# Patient Record
Sex: Male | Born: 1940 | Race: Black or African American | Hispanic: No | Marital: Married | Smoking: Current every day smoker
Health system: Southern US, Community
[De-identification: ages and names within clinical notes are randomized; demographics above are authoritative.]

## PROBLEM LIST (undated history)

## (undated) DIAGNOSIS — R6520 Severe sepsis without septic shock: Secondary | ICD-10-CM

## (undated) DIAGNOSIS — I82409 Acute embolism and thrombosis of unspecified deep veins of unspecified lower extremity: Secondary | ICD-10-CM

## (undated) DIAGNOSIS — I482 Chronic atrial fibrillation, unspecified: Secondary | ICD-10-CM

## (undated) DIAGNOSIS — I2699 Other pulmonary embolism without acute cor pulmonale: Secondary | ICD-10-CM

## (undated) DIAGNOSIS — J9600 Acute respiratory failure, unspecified whether with hypoxia or hypercapnia: Secondary | ICD-10-CM

## (undated) DIAGNOSIS — K259 Gastric ulcer, unspecified as acute or chronic, without hemorrhage or perforation: Secondary | ICD-10-CM

## (undated) DIAGNOSIS — N390 Urinary tract infection, site not specified: Secondary | ICD-10-CM

## (undated) DIAGNOSIS — I272 Pulmonary hypertension, unspecified: Secondary | ICD-10-CM

## (undated) DIAGNOSIS — J45909 Unspecified asthma, uncomplicated: Secondary | ICD-10-CM

## (undated) DIAGNOSIS — K219 Gastro-esophageal reflux disease without esophagitis: Secondary | ICD-10-CM

## (undated) DIAGNOSIS — I509 Heart failure, unspecified: Secondary | ICD-10-CM

## (undated) DIAGNOSIS — E119 Type 2 diabetes mellitus without complications: Secondary | ICD-10-CM

## (undated) DIAGNOSIS — A419 Sepsis, unspecified organism: Secondary | ICD-10-CM

## (undated) DIAGNOSIS — E7800 Pure hypercholesterolemia, unspecified: Secondary | ICD-10-CM

## (undated) DIAGNOSIS — I10 Essential (primary) hypertension: Secondary | ICD-10-CM

## (undated) DIAGNOSIS — J449 Chronic obstructive pulmonary disease, unspecified: Secondary | ICD-10-CM

## (undated) DIAGNOSIS — I4891 Unspecified atrial fibrillation: Secondary | ICD-10-CM

## (undated) DIAGNOSIS — I50812 Chronic right heart failure: Secondary | ICD-10-CM

## (undated) HISTORY — PX: HERNIA REPAIR: SHX51

## (undated) HISTORY — PX: PROSTATE SURGERY: SHX751

---

## 2004-11-10 ENCOUNTER — Other Ambulatory Visit: Payer: Self-pay

## 2004-11-10 ENCOUNTER — Inpatient Hospital Stay: Payer: Self-pay | Admitting: Anesthesiology

## 2005-06-23 ENCOUNTER — Ambulatory Visit: Payer: Self-pay | Admitting: Internal Medicine

## 2006-06-28 ENCOUNTER — Ambulatory Visit: Payer: Self-pay | Admitting: Internal Medicine

## 2009-03-19 ENCOUNTER — Emergency Department: Payer: Self-pay | Admitting: Unknown Physician Specialty

## 2009-11-10 ENCOUNTER — Ambulatory Visit: Payer: Self-pay | Admitting: Internal Medicine

## 2009-11-10 ENCOUNTER — Inpatient Hospital Stay: Payer: Self-pay | Admitting: Internal Medicine

## 2010-04-13 ENCOUNTER — Ambulatory Visit: Payer: Self-pay | Admitting: Specialist

## 2010-09-16 ENCOUNTER — Ambulatory Visit: Payer: Self-pay | Admitting: Family Medicine

## 2010-09-29 ENCOUNTER — Ambulatory Visit: Payer: Self-pay | Admitting: Specialist

## 2010-11-10 ENCOUNTER — Ambulatory Visit: Payer: Self-pay | Admitting: Internal Medicine

## 2010-11-17 ENCOUNTER — Inpatient Hospital Stay: Payer: Self-pay | Admitting: Internal Medicine

## 2010-11-22 LAB — PSA

## 2010-12-02 ENCOUNTER — Ambulatory Visit: Payer: Self-pay | Admitting: Internal Medicine

## 2010-12-11 ENCOUNTER — Ambulatory Visit: Payer: Self-pay | Admitting: Internal Medicine

## 2011-04-13 ENCOUNTER — Inpatient Hospital Stay: Payer: Self-pay | Admitting: Internal Medicine

## 2011-04-14 DIAGNOSIS — I4891 Unspecified atrial fibrillation: Secondary | ICD-10-CM

## 2011-09-11 ENCOUNTER — Inpatient Hospital Stay: Payer: Self-pay | Admitting: Internal Medicine

## 2013-11-24 ENCOUNTER — Observation Stay: Payer: Self-pay | Admitting: Internal Medicine

## 2013-11-24 LAB — BASIC METABOLIC PANEL
BUN: 17 mg/dL (ref 7–18)
Calcium, Total: 10.5 mg/dL — ABNORMAL HIGH (ref 8.5–10.1)
Chloride: 108 mmol/L — ABNORMAL HIGH (ref 98–107)
Co2: 30 mmol/L (ref 21–32)
Creatinine: 1.25 mg/dL (ref 0.60–1.30)
EGFR (African American): >60
EGFR (Non-African Amer.): 58 — ABNORMAL LOW
Glucose: 114 mg/dL — ABNORMAL HIGH (ref 65–99)
Potassium: 4.2 mmol/L (ref 3.5–5.1)
Sodium: 140 mmol/L (ref 136–145)

## 2013-11-24 LAB — CBC
MCH: 31.3 pg (ref 26.0–34.0)
MCHC: 32.3 g/dL (ref 32.0–36.0)
MCV: 97 fL (ref 80–100)
RDW: 15.9 % — ABNORMAL HIGH (ref 11.5–14.5)
WBC: 6.9 10*3/uL (ref 3.8–10.6)

## 2013-11-24 LAB — PROTIME-INR
INR: 1.2
Prothrombin Time: 15.2 secs — ABNORMAL HIGH (ref 11.5–14.7)

## 2013-11-25 LAB — BASIC METABOLIC PANEL
Anion Gap: 4 — ABNORMAL LOW (ref 7–16)
BUN: 14 mg/dL (ref 7–18)
EGFR (African American): >60
Osmolality: 275 (ref 275–301)
Potassium: 3.9 mmol/L (ref 3.5–5.1)
Sodium: 138 mmol/L (ref 136–145)

## 2013-11-25 LAB — CBC WITH DIFFERENTIAL/PLATELET
Basophil #: 0 10*3/uL (ref 0.0–0.1)
HGB: 14 g/dL (ref 13.0–18.0)
Lymphocyte #: 1.3 10*3/uL (ref 1.0–3.6)
MCH: 31.4 pg (ref 26.0–34.0)
MCV: 96 fL (ref 80–100)
Monocyte #: 0.6 x10 3/mm (ref 0.2–1.0)
Monocyte %: 8.3 %
Neutrophil %: 72.4 %
Platelet: 138 10*3/uL — ABNORMAL LOW (ref 150–440)
RBC: 4.45 10*6/uL (ref 4.40–5.90)
RDW: 15.5 % — ABNORMAL HIGH (ref 11.5–14.5)
WBC: 7.1 10*3/uL (ref 3.8–10.6)

## 2013-11-25 LAB — HEMOGLOBIN A1C: Hemoglobin A1C: 7.1 % — ABNORMAL HIGH (ref 4.2–6.3)

## 2014-03-19 ENCOUNTER — Inpatient Hospital Stay: Payer: Self-pay | Admitting: Family Medicine

## 2014-03-19 LAB — URINALYSIS, COMPLETE
BILIRUBIN, UR: NEGATIVE
Bacteria: NONE SEEN
Glucose,UR: NEGATIVE mg/dL (ref 0–75)
Ketone: NEGATIVE
Leukocyte Esterase: NEGATIVE
NITRITE: NEGATIVE
PH: 5 (ref 4.5–8.0)
RBC,UR: 68 /HPF (ref 0–5)
Specific Gravity: 1.018 (ref 1.003–1.030)
Squamous Epithelial: 1
WBC UR: 4 /HPF (ref 0–5)

## 2014-03-19 LAB — COMPREHENSIVE METABOLIC PANEL
ALBUMIN: 3.3 g/dL — AB (ref 3.4–5.0)
AST: 13 U/L — AB (ref 15–37)
Alkaline Phosphatase: 77 U/L
Anion Gap: 5 — ABNORMAL LOW (ref 7–16)
BUN: 13 mg/dL (ref 7–18)
Bilirubin,Total: 0.6 mg/dL (ref 0.2–1.0)
CO2: 27 mmol/L (ref 21–32)
CREATININE: 1.28 mg/dL (ref 0.60–1.30)
Calcium, Total: 9.8 mg/dL (ref 8.5–10.1)
Chloride: 105 mmol/L (ref 98–107)
EGFR (African American): >60
EGFR (Non-African Amer.): 56 — ABNORMAL LOW
Glucose: 111 mg/dL — ABNORMAL HIGH (ref 65–99)
OSMOLALITY: 275 (ref 275–301)
POTASSIUM: 3.7 mmol/L (ref 3.5–5.1)
SGPT (ALT): 11 U/L — ABNORMAL LOW (ref 12–78)
Sodium: 137 mmol/L (ref 136–145)
TOTAL PROTEIN: 6.5 g/dL (ref 6.4–8.2)

## 2014-03-19 LAB — CBC WITH DIFFERENTIAL/PLATELET
BASOS PCT: 0.7 %
Basophil #: 0 10*3/uL (ref 0.0–0.1)
EOS ABS: 0 10*3/uL (ref 0.0–0.7)
EOS PCT: 0.3 %
HCT: 45.7 % (ref 40.0–52.0)
HGB: 15.1 g/dL (ref 13.0–18.0)
Lymphocyte #: 0.2 10*3/uL — ABNORMAL LOW (ref 1.0–3.6)
Lymphocyte %: 3.8 %
MCH: 31.9 pg (ref 26.0–34.0)
MCHC: 33 g/dL (ref 32.0–36.0)
MCV: 97 fL (ref 80–100)
MONO ABS: 0.9 x10 3/mm (ref 0.2–1.0)
MONOS PCT: 15.7 %
Neutrophil #: 4.8 10*3/uL (ref 1.4–6.5)
Neutrophil %: 79.5 %
Platelet: 149 10*3/uL — ABNORMAL LOW (ref 150–440)
RBC: 4.72 10*6/uL (ref 4.40–5.90)
RDW: 14.4 % (ref 11.5–14.5)
WBC: 6 10*3/uL (ref 3.8–10.6)

## 2014-03-19 LAB — MAGNESIUM: Magnesium: 1.5 mg/dL — ABNORMAL LOW

## 2014-03-19 LAB — PROTIME-INR
INR: 1.2
Prothrombin Time: 15.3 secs — ABNORMAL HIGH (ref 11.5–14.7)

## 2014-03-19 LAB — TROPONIN I: Troponin-I: 0.13 ng/mL — ABNORMAL HIGH

## 2014-03-19 LAB — PRO B NATRIURETIC PEPTIDE: B-Type Natriuretic Peptide: 2622 pg/mL — ABNORMAL HIGH (ref 0–125)

## 2014-03-19 LAB — LIPASE, BLOOD: LIPASE: 90 U/L (ref 73–393)

## 2014-03-20 LAB — CK-MB
CK-MB: 1.8 ng/mL (ref 0.5–3.6)
CK-MB: 2.6 ng/mL (ref 0.5–3.6)
CK-MB: 2.2 ng/mL (ref 0.5–3.6)

## 2014-03-20 LAB — TROPONIN I
TROPONIN-I: 1.5 ng/mL — AB
Troponin-I: 0.53 ng/mL — ABNORMAL HIGH
Troponin-I: 2 ng/mL — ABNORMAL HIGH
Troponin-I: 2.5 ng/mL — ABNORMAL HIGH
Troponin-I: 2.2 ng/mL — ABNORMAL HIGH

## 2014-03-21 LAB — CBC WITH DIFFERENTIAL/PLATELET
BASOS PCT: 1.1 %
Basophil #: 0 10*3/uL (ref 0.0–0.1)
Eosinophil #: 0 10*3/uL (ref 0.0–0.7)
Eosinophil %: 0 %
HCT: 44.9 % (ref 40.0–52.0)
HGB: 14.3 g/dL (ref 13.0–18.0)
LYMPHS PCT: 11.5 %
Lymphocyte #: 0.3 10*3/uL — ABNORMAL LOW (ref 1.0–3.6)
MCH: 31.2 pg (ref 26.0–34.0)
MCHC: 31.9 g/dL — ABNORMAL LOW (ref 32.0–36.0)
MCV: 98 fL (ref 80–100)
MONO ABS: 0.1 x10 3/mm — AB (ref 0.2–1.0)
MONOS PCT: 4.6 %
NEUTROS PCT: 82.8 %
Neutrophil #: 2.3 10*3/uL (ref 1.4–6.5)
PLATELETS: 137 10*3/uL — AB (ref 150–440)
RBC: 4.6 10*6/uL (ref 4.40–5.90)
RDW: 14.2 % (ref 11.5–14.5)
WBC: 2.8 10*3/uL — ABNORMAL LOW (ref 3.8–10.6)

## 2014-03-21 LAB — BASIC METABOLIC PANEL
Anion Gap: 3 — ABNORMAL LOW (ref 7–16)
BUN: 16 mg/dL (ref 7–18)
CREATININE: 1.16 mg/dL (ref 0.60–1.30)
Calcium, Total: 9.8 mg/dL (ref 8.5–10.1)
Chloride: 104 mmol/L (ref 98–107)
Co2: 30 mmol/L (ref 21–32)
EGFR (African American): >60
EGFR (Non-African Amer.): >60
Glucose: 200 mg/dL — ABNORMAL HIGH (ref 65–99)
Osmolality: 281 (ref 275–301)
Potassium: 4.7 mmol/L (ref 3.5–5.1)
Sodium: 137 mmol/L (ref 136–145)

## 2014-03-22 LAB — BASIC METABOLIC PANEL
Anion Gap: 5 — ABNORMAL LOW (ref 7–16)
BUN: 20 mg/dL — ABNORMAL HIGH (ref 7–18)
CHLORIDE: 103 mmol/L (ref 98–107)
CO2: 28 mmol/L (ref 21–32)
CREATININE: 0.99 mg/dL (ref 0.60–1.30)
Calcium, Total: 9.7 mg/dL (ref 8.5–10.1)
EGFR (African American): >60
Glucose: 208 mg/dL — ABNORMAL HIGH (ref 65–99)
Osmolality: 281 (ref 275–301)
Potassium: 4.4 mmol/L (ref 3.5–5.1)
SODIUM: 136 mmol/L (ref 136–145)

## 2014-03-22 LAB — MAGNESIUM: Magnesium: 1.6 mg/dL — ABNORMAL LOW

## 2014-03-23 LAB — LIPID PANEL
Cholesterol: 130 mg/dL (ref 0–200)
HDL Cholesterol: 34 mg/dL — ABNORMAL LOW (ref 40–60)
Ldl Cholesterol, Calc: 74 mg/dL (ref 0–100)
TRIGLYCERIDES: 111 mg/dL (ref 0–200)
VLDL Cholesterol, Calc: 22 mg/dL (ref 5–40)

## 2014-03-24 LAB — CULTURE, BLOOD (SINGLE)

## 2015-03-06 ENCOUNTER — Emergency Department: Payer: Self-pay | Admitting: Emergency Medicine

## 2015-03-06 LAB — COMPREHENSIVE METABOLIC PANEL
ALK PHOS: 67 U/L
ANION GAP: 7 (ref 7–16)
Albumin: 3.8 g/dL
BILIRUBIN TOTAL: 1.1 mg/dL
BUN: 15 mg/dL
CHLORIDE: 102 mmol/L
CREATININE: 1.19 mg/dL
Calcium, Total: 10.1 mg/dL
Co2: 29 mmol/L
EGFR (African American): >60
EGFR (Non-African Amer.): >60
Glucose: 77 mg/dL
Potassium: 3.9 mmol/L
SGOT(AST): 20 U/L
SGPT (ALT): 9 U/L — ABNORMAL LOW
Sodium: 138 mmol/L
Total Protein: 6.5 g/dL

## 2015-03-06 LAB — CBC WITH DIFFERENTIAL/PLATELET
Basophil #: 0.1 10*3/uL (ref 0.0–0.1)
Basophil %: 1.1 %
Eosinophil #: 0.1 10*3/uL (ref 0.0–0.7)
Eosinophil %: 2.4 %
HCT: 47.7 % (ref 40.0–52.0)
HGB: 15.3 g/dL (ref 13.0–18.0)
Lymphocyte #: 1 10*3/uL (ref 1.0–3.6)
Lymphocyte %: 18.5 %
MCH: 32.1 pg (ref 26.0–34.0)
MCHC: 32.1 g/dL (ref 32.0–36.0)
MCV: 100 fL (ref 80–100)
MONO ABS: 0.5 x10 3/mm (ref 0.2–1.0)
MONOS PCT: 9.2 %
NEUTROS ABS: 3.8 10*3/uL (ref 1.4–6.5)
Neutrophil %: 68.8 %
Platelet: 138 10*3/uL — ABNORMAL LOW (ref 150–440)
RBC: 4.79 10*6/uL (ref 4.40–5.90)
RDW: 15.5 % — AB (ref 11.5–14.5)
WBC: 5.5 10*3/uL (ref 3.8–10.6)

## 2015-03-06 LAB — LIPASE, BLOOD: Lipase: 48 U/L

## 2015-03-06 LAB — TROPONIN I
TROPONIN-I: 0.07 ng/mL — AB
TROPONIN-I: 0.05 ng/mL — AB

## 2015-04-02 NOTE — H&P (Signed)
PATIENT NAME:  Austin Austin [name], Austin Austin [name] MR#:  [id number] DATE OF BIRTH:  [birth year]  DATE OF ADMISSION:  11/24/2013  PRIMARY CARE PHYSICIMarlowe SaxN: [name]  CARDIOLOGIST: Dr. Juliann Austin [name].  PRIMARY PULMONOLOGIST: In the past was Dr. Meredeth IdeFleming, currently no one   REFERRING EMERGENCY ROOM PHYSICIAN: Dr. Rockne MenghiniAnne-Caroline Austin [name].  CHIEF COMPLAINT: Coughing up blood.   HISTORY OF PRESENT ILLNESS: This is Austin [name] 74 year old male who is Austin [name] chronic smoker, still smokes 1 pack of cigarettes per day. Has history of severe pulmonary emphysema secondary to his smoking habit. Also had pulmonary embolism in the past, diagnosed in 2010, taken off Coumadin because of GI bleed in December 2011. Lung nodule in right lung, but PET scan was negative for malignancy in the past. Was doing fine until 2 days ago when he started having worsening of his cough and finally, he started having some blood in his sputum. He has been coughing up since yesterday and so finally got concerned and decided to come to the Emergency Room. He denies any sputum production or any fever, any chills or chest pain.   REVIEW OF SYSTEMS:    CONSTITUTIONAL: Negative for fever, body weakness, pain, or weight loss.  EYES: No blurring, double vision, discharge, or redness.  EARS, NOSE, THROAT: No tinnitus, ear pain, or hearing loss.  RESPIRATORY: Has cough but no wheezing or shortness of breath. Has some blood in his sputum.  CARDIOVASCULAR: No chest pain, orthopnea, edema, arrhythmia, or palpitations.  GASTROINTESTINAL: No nausea, vomiting, diarrhea, or abdominal pain.  GENITOURINARY: No dysuria, hematuria, or increased frequency.  ENDOCRINE: No heat or cold intolerance. No increased sweating.  SKIN: No acne or rashes or lesions.  MUSCULOSKELETAL: No pain or swelling in the joints.  NEUROLOGICAL: No numbness, weakness, tremors, or vertigo.  PSYCHIATRIC: Does not appear in any anxiety or insomnia or bipolar disorder.   PAST  MEDICAL HISTORY:  1.  Hypertension.  2.  Diabetes.  3.  Hyperlipidemia.  4.  COPD, current smoker.  5.  History of pulmonary embolism in the past in 2010 and was on Coumadin, stopped due to GI bleed in December 2011.  6.  History of lung nodule in the past in right lung, but PET scan was negative for malignancy.  7.  Diagnosed with atrial fibrillation in May 2012 and had cardiac catheterization, suggested to have medical therapy.   PAST SURGICAL HISTORY: Cholecystectomy, prostate surgery for enlarged prostate, surgery of bridge of nose.   SOCIAL HISTORY: Lives with his wife. Retired as Austin [name] Visual merchandiserfarmer. Smoked 1 pack per day since many years. No alcohol or drug use.   FAMILY HISTORY: Brother had kidney transplant and sister had brain aneurysm.   HOME MEDICATIONS:   1.  Omeprazole 20 mg 2 times Austin [name] day.  2.  Metformin 500 mg 2 tablets 2 times Austin [name] day.  3.  Lovastatin 40 mg once Austin [name] day.  4.  Lisinopril 40 mg once Austin [name] day.  5.  Januvia 100 mg once Austin [name] day.  6.  Furosemide 20 mg 2 tablets once Austin [name] day.  7.  Combivent 2 puffs as needed.  8.  Aspirin 81 mg.  9.  Amlodipine 5 mg once Austin [name] day.  10.  Advair 1 puff 2 times Austin [name] day.   PHYSICAL EXAMINATION: VITAL SIGNS: In ER: Temperature 95.3, pulse rate 70, blood pressure 127/62, respirations 18, and he is saturating 94% to 95% on room air.  GENERAL: The patient is fully alert and oriented to time, place, and  person. Does not appear in any acute distress.  HEENT: Head and neck atraumatic. Conjunctivae pink. Oral mucosa moist.  NECK: Supple. No JVD.  RESPIRATORY: Bilateral equal air entry. There are diffuse crackling sounds present bilaterally. No local tenderness on chest compression.  CARDIOVASCULAR: S1, S2 present, regular. No murmur.  ABDOMEN: Soft, nontender. Bowel sounds present. No organomegaly.  SKIN: No rashes.  EXTREMITIES: Legs: No edema.  NEUROLOGICAL: Power 5/5. Moves all 4 limbs and follows commands.  PSYCHIATRIC: Does not appear in any acute  psychiatric illness.  MUSCULOSKELETAL: Joints: No swelling or tenderness.   IMPORTANT LABORATORY AND RADIOLOGIC RESULTS: Glucose 114, BUN 17, creatinine 1.25, potassium 4.2, chloride 108, CO2 of 30, calcium 10.5. WBC 6.9, hemoglobin 14.9, platelet count 155. Prothrombin 15.2, INR 1.2 , PTT 30.8. PH 7.44, pCO2 of 38 and pO2 of 51 on room air. Chest x-ray does not show any acute finding except for emphysema. V/Q scan is done which showed indeterminate ventricular perfusion lung scan due to diffuse abnormality. Ultrasound, bilateral lower limbs: No evidence of lower extremity DVT.   ASSESSMENT AND PLAN: This is Austin [name] 74 year old male who is Austin [name] smoker with emphysema history and pulmonary embolism, came with hemoptysis for the last 2 days.  1.  Hemoptysis: Most likely should be either as Austin [name] result of his chronic smoking and emphysema, or it would be Austin [name] malignancy. Will get Austin [name] CT scan of the chest without contrast because THE PATIENT IS ALLERGIC TO CONTRAST DYE and will monitor his hemoglobin and will call pulmonary consult, Dr. Meredeth [name] as he saw him in the past, for further management of this issue  2.  Chronic obstructive pulmonary disease and emphysema: Currently, there are no exacerbation signs, no wheezing, so we will continue Advair and his bronchodilator inhalers, as-needed basis.  3.  History of pulmonary embolism: He was not on any anticoagulation because of gastrointestinal bleed in the past. Will continue monitoring. I do not think it is Austin [name] pulmonary embolism, as he is not very hypoxic and he is saturating 94 to 95 on room air having severe emphysema, so pulmonary embolism is less likely in this case. For chronic obstructive pulmonary disease there are no exacerbation symptoms, so we will continue the baseline things.  4.  Current smoker: Smoking cessation counseling is done for 4 minutes and offered Austin [name] nicotine patch and gum, and he agreed to have that. 5.  Diabetes: We will continue Januvia and metformin and  will put him on insulin sliding scale coverage.  6.  Hypertension: We will continue his home medication.   TOTAL TIME SPENT ON THIS ADMISSION: 50 minutes.   ____________________________ Hope Pigeon Elisabeth Pigeon, MD vgv:jcm D: 11/24/2013 15:16:00 ET T: 11/24/2013 16:47:22 ET JOB#: [id number]  cc: Hope Pigeon. Elisabeth Pigeon, MD, <Dictator> Herbon E. Austin Ide, MD Heath Gold [facility] MD ELECTRONICALLY SIGNED 12/01/2013 14:22

## 2015-04-03 NOTE — Consult Note (Signed)
PATIENT NAME:  Austin [name], Masahiro A MR#:  [id number] DATE OF BIRTH:  December 02, 1941  DATE OF CONSULTATION:  03/20/2014  REFERRING PHYSICIAN:  Alford Highlandichard Wieting, MD CONSULTING PHYSICIAN:  Lamar BlinksBruce J. Nashla Althoff, MD  REASON FOR CONSULTATION: Atrial fibrillation with rapid ventricular rate, dilated cardiomyopathy, hypertension, and hyperlipidemia with sepsis and sick sinus syndrome. The patient has had recent episode of sepsis of unknown etiology with some bronchitis-type symptoms. He has had chronic atrial fibrillation in the past, although currently is now in normal sinus rhythm after an episode of atrial fibrillation with rapid ventricular rate. The patient has had a subendocardial myocardial infarction with an elevated troponin of 2 and an elevated BNP of 2622. He has been previously on appropriate medication management for his diabetes, hypertension, hyperlipidemia, atrial fibrillation, and pulmonary hypertension. He does feel better since admission with less symptoms.   REVIEW OF SYSTEMS: The remainder review of systems is negative for vision change, ringing in the ears, hearing loss, heartburn, nausea, vomiting, diarrhea, bloody stools, stomach pain, extremity pain, leg weakness, cramping of the buttocks, known blood clots, headaches, blackouts, dizzy spells, nosebleeds, congestion, trouble swallowing, frequent urination, urination at night, muscle weakness, numbness, anxiety, depression, skin lesions or skin rashes.   PAST MEDICAL HISTORY: 1.  Intermittent atrial fibrillation.  2.  Hypertension.  3.  Diabetes mellitus.  4.  Valvular heart disease.  5.  Pulmonary hypertension.   FAMILY HISTORY: No family members with early onset of cardiovascular disease or hypertension.   SOCIAL HISTORY: Currently denies alcohol or tobacco use.   ALLERGIES: As listed.   MEDICATIONS: As listed.   PHYSICAL EXAMINATION: VITAL SIGNS: Blood pressure is 110/68 bilaterally and heart rate is 72 upright and reclining and  irregular.  GENERAL: He is a well appearing male in no acute distress.  HEENT: No icterus, thyromegaly, ulcers, hemorrhage, or xanthelasma.  CARDIOVASCULAR: Irregularly irregular. Normal S1 and S2. A 3/6 apical murmur consistent with mitral regurgitation. PMI is diffuse. Carotid upstroke normal without bruit. Jugular venous pressure is normal.  LUNGS: Have bibasilar crackles with expiratory wheezes.  ABDOMEN: Soft and nontender without hepatosplenomegaly or masses. Abdominal aorta is normal size without bruit.  EXTREMITIES: 2+ bilateral pulses in dorsal, pedal, radial and femoral arteries with trace lower extremity edema. No cyanosis, clubbing, or ulcers.  NEUROLOGIC: He is oriented to time, place, and person with normal mood and affect.   ASSESSMENT: This is a 74 year old male with diabetes, hypertension, and hyperlipidemia with atrial fibrillation with rapid ventricular rate likely secondary to recent infection and concerns of mild systolic dysfunction, congestive heart failure, and elevated troponin consistent with subendocardial myocardial infarction.   RECOMMENDATIONS: 1.  Continue serial ECG and enzymes to assess for possible extremes of myocardial infarction.  2.  Continue heart rate control with beta blocker and maintenance of normal sinus rhythm.  3.  Further consideration of anticoagulation if the patient able if no concerns of current bleeding complications.  4.  Treat primary problem including the possibility of sepsis and/or infection.  5.  Diabetes control with insulin injection.  6.  Echocardiogram for LV systolic dysfunction and valvular heart disease causing above issues.  7.  Further adjustments of medications thereafter and possible discharge to home.  ____________________________ Lamar BlinksBruce J. Brytani Voth, MD bjk:sb D: 03/20/2014 14:39:36 ET T: 03/20/2014 15:05:50 ET JOB#: [id number]  cc: Lamar BlinksBruce J. Deajah Erkkila, MD, <Dictator> Lamar BlinksBRUCE J Mark Benecke MD ELECTRONICALLY SIGNED 03/24/2014 7:50

## 2015-04-03 NOTE — Discharge Summary (Signed)
PATIENT NAME:  Austin Austin [name], Austin Austin [name] MR#:  [id number] DATE OF BIRTH:  [birth year]  DATE OF ADMISSION:  03/19/2014 DATE OF DISCHARGE:  03/23/2014  REASON FOR ADMISSION: Shortness of breath.   PRIMARY CARE PHYSICIAN: Dr. Jorene Guestomstock  DISCHARGE FOLLOWUP: With Dr. Jorene Guestomstock and Dr. Juliann Paresallwood. Please arrange stress test outpatient and echocardiogram outpatient in the office of Dr. Juliann Paresallwood.   DISCHARGE DIAGNOSES: 1.  Atrial fibrillation with rapid ventricular response, now normal sinus rhythm.  2.  Non-ST elevation myocardial infarction, type II, due to demand ischemia.  3.  Sepsis at admission.  4.  Chronic obstructive pulmonary disease with acute bronchitis.  5.  Acute hypoxic respiratory failure due to chronic obstructive pulmonary disease and congestive heart failure flare.  6.  Diastolic congestive heart failure, compensated.  7.  Type 2 diabetes on Januvia and metformin.  8.  Tobacco abuse.  9.  Smoking cessation counseling given to the patient for over 5 minutes at discharge.   CODE STATUS: FULL.  HOSPITAL COURSE: This is Austin [name] very nice 74 year old gentleman with history of COPD, hypertension, diabetes, and atrial fibrillation, new diagnosis, presenting with shortness of breath and atrial fibrillation with RVR. The patient was seen in the hospital with significant shortness of breath, requiring 6 liters of oxygen.   He had significant elevation of heart rate around 140 and had significant saturation of O2 in the 70s on room air. The patient was admitted with the diagnosis of sepsis. The sepsis was secondary to an exacerbation of his COPD with acute bronchitis. The patient was started on Zosyn and vancomycin to cover for possible pneumonia. Chest x-ray did not show any significant consolidation, mostly changes that were equivalent to bronchitis, for what those antibiotics were downgraded and changed to levofloxacin. The patient was started on steroids and now we have him on Austin [name] steroid taper with  prednisone.   As far as atrial fibrillation, since it was rapid ventricular response, I spoke with Dr. Gwen PoundsKowalski. From his records, he thinks that this is new onset as he apparently has history of intermittent, but has been off normal sinus syndrome rhythm for Austin [name] while. He recommended no chronic anticoagulation at this moment and re-evaluate in the office. The patient was treated with Lovenox 1 mg/kg due to that acute onset atrial fibrillation on top non-ST elevation MI.   As far as his non-ST elevation MI, the patient does not have history of known coronary artery disease. He does have valvular disease and it seems like the non-ST elevation MI was secondary to demand ischemia as the patient had atrial fibrillation. Since the patient had significant acceleration of the heart rate, there was no echocardiogram done. It is scheduled to be done outpatient.   The patient did have what appears to be Austin [name] component of CHF, although his x-ray did not show any significant fluid overload. The evaluation of CHF is going to be done outpatient with an echocardiogram and Austin [name] stress test in the office. As far as his non-ST elevation MI, the patient also was started on aspirin. He was started on Austin [name] beta blocker and Austin [name] statin. Lipid profile was sent and is pending at this moment. Again, the patient did not have any significant history of coronary artery disease and the heart attack was mostly secondary to demand ischemia, as per Dr. Gwen PoundsKowalski. We are doing Austin [name] lipid profile for management of prevention in the future.   As far as his COPD, the patient has improved. He is off oxygen. His acute respiratory failure  resolved. He had hypomagnesemia, which was replaced. He had significant increase in blood sugars due to steroids. Dose of steroid was tapered and he is going to go back on metformin and sitagliptin. The patient is discharged in good condition.   TIME SPENT ON DISCHARGE: About 45 minutes. ____________________________ Felipa Furnace, MD rsg:sb D: 03/23/2014 11:45:18 ET T: 03/23/2014 12:00:34 ET JOB#: [id number]  cc: Felipa Furnace, MD, <Dictator> Johncarlos Holtsclaw Juanda Chance MD ELECTRONICALLY SIGNED 03/29/2014 5:17

## 2015-04-03 NOTE — H&P (Signed)
PATIENT NAME:  Austin [name], [name] MR#:  [id number] DATE OF BIRTH:  1941/04/08  DATE OF ADMISSION:  03/19/2014  REFERRING PHYSICIAN:  Dr. York CeriseForbach.   PRIMARY CARE PHYSICIAN:  Dr. Jorene Guestomstock.   CHIEF COMPLAINT:  Shortness of breath.   HISTORY OF PRESENT ILLNESS:  A 74 year old African American gentleman with history of COPD, non-O2 dependent, hypertension, diabetes, A. Fib, presenting with shortness of breath, has two to three day duration of shortness of breath, mainly described as dyspnea on exertion with associated cough which has been productive of yellowish-white sputum with associated subjective fevers and chills with symptoms.  With those symptoms he also has been describing intermittent chest pain located upper left chest, nonradiating, dull in quality, 3 to 4 out of 10 in intensity, nonradiating.  No worsening or relieving factors as well as associated palpitations.  He saw his PCP for the above symptoms the day of admission, noted to be tachycardic with heart rate in the 140s as well as hypoxemic with SaO2 in the 70s, thus presented to the hospital for further work-up and evaluation.  Currently complaining only of shortness of breath, though improved on 6 liters nasal cannula.   REVIEW OF SYSTEMS:  CONSTITUTIONAL:  Positive for subjective fevers, chills, fatigue, weakness.  EYES:  Denied blurred vision, double vision, eye pain.  EARS, NOSE, THROAT:  Denies tinnitus, ear pain, hearing loss.  RESPIRATORY:  Positive for cough and shortness of breath as described above.  CARDIOVASCULAR:  Positive for chest pain and palpitations.  Denies any edema or orthopnea. GASTROINTESTINAL:  Denies nausea, vomiting, diarrhea or abdominal pain.  GENITOURINARY:  Denies dysuria or hematuria.  ENDOCRINE:  Denies nocturia or thyroid problems.  HEMATOLOGIC AND LYMPHATICS:  Denies easy bruising, bleeding.  SKIN:  Denies rash or lesions.  MUSCULOSKELETAL:  Denies pain in neck, back, shoulders, knees, hips or  arthritis symptoms.  NEUROLOGIC:  Denies paralysis, paresthesias.  PSYCHIATRIC:  Denies anxiety or depressive symptoms. Otherwise, full review of systems performed by me is negative.   PAST MEDICAL HISTORY:  Hypertension, diabetes, hyperlipidemia, COPD, history of PE, not on anticoagulation secondary to GI bleed as well as atrial fibrillation.   SOCIAL HISTORY:  Positive for tobacco abuse.  Denies any alcohol or drug usage.   FAMILY HISTORY:  Denies any known cardiovascular or pulmonary illnesses.   ALLERGIES:  IODINE AND IV CONTRAST DYE.   HOME MEDICATIONS:  Include lisinopril 40 mg by mouth daily, Januvia 100 mg by mouth daily, metformin 1000 mg by mouth twice daily, lovastatin 40 mg by mouth daily, Combivent 18/103 mcg inhalation 2 puffs as needed for shortness of breath, Spiriva 2.5 mcg inhalation daily, Norvasc 5 mg by mouth daily, Lasix 40 mg by mouth daily, Prilosec 20 mg by mouth twice daily.   PHYSICAL EXAMINATION: VITAL SIGNS:  On arrival temperature 101.3, heart rate 148, respirations 20, blood pressure 127/72, saturating 100% on 6 liters nasal cannula.  Blood pressure at its lowest 80/58.  Currently, he is 111/56 with a heart rate of 132, in atrial fibrillation.  Weight 81.6 kg, BMI 26.6.  GENERAL:  Chronically ill-appearing African American gentleman, currently in mild to moderate distress secondary to respiratory status as well as heart rate.  HEAD:  Normocephalic, atraumatic.  EYES:  Pupils equal, round, reactive to light.  Extraocular muscles intact.  No scleral icterus.  MOUTH:  Dry mucosal membranes.  Dentition poor.  No abscess noted.  EARS, NOSE, THROAT:  Throat clear without exudates.  No external lesions.  NECK:  Supple.  No thyromegaly.  No nodules.  No JVD.  PULMONARY:  Decreased breath sounds throughout all lung fields secondary to respiratory effort; however, no frank wheezes, rubs or rhonchi.  No use of accessory muscles, though tachypneic.  Poor respiratory effort.   CHEST:  Nontender to palpation.  CARDIOVASCULAR:  S1, S2, irregular rate, irregular rhythm, tachycardic.  No murmurs, rubs, or gallops.  No edema.  Pedal pulse 2+ bilaterally.  GASTROINTESTINAL:  Soft, nontender, nondistended.  No masses.  Positive bowel sounds.  No hepatosplenomegaly.  MUSCULOSKELETAL:  No swelling, clubbing, edema. NEUROLOGIC:  Cranial nerves II through XII intact.  No gross focal neurological deficits.  Sensation intact.  Reflexes intact.  SKIN:  No ulceration, lesions, rashes or cyanosis.  Skin warm and dry.  Turgor intact.  PSYCHIATRIC:  Mood and affect within normal limits.  The patient is awake, alert and oriented x 3.  Insight and judgment intact.   LABORATORY DATA:  Chest x-ray performed revealing no acute cardiopulmonary process.  EKG performed revealing A. Fib with rapid ventricular response, heart rate initially in the 160s.  Remainder of laboratory data:  Sodium 137, potassium 3.7, chloride 105, bicarb 27, BUN 13, creatinine 1.28, glucose 111.  BNP 2622, magnesium 1.5, lipase 90.  LFTs:  Total protein 6.5, albumin 3.3, remainder within normal limits.  Troponin I 0.13.  WBC 6, hemoglobin 15.1, platelets 149.  INR of 1.2.  Urinalysis negative for evidence of infection.  ABG performed, 7.41/39/221/24, which was initially performed on a nonrebreather breather with lactic acid of 1.2.   ASSESSMENT AND PLAN:  A 74 year old African American gentleman with history of chronic obstructive pulmonary disease, hypertension, atrial fibrillation, presenting with shortness of breath.  1.  Sepsis, meeting septic criteria by heart rate, temperature and respiratory rate.  Panculture including blood and sputum.  I suspect respiratory etiology despite negative chest x-ray.  Ideally after intravenous fluid hydration he may have infiltration.  For antibiotic coverage vancomycin and Zosyn, which were already given once in the Emergency Department.  We will continue these until culture data  returns.  Intravenous fluid hydration to keep mean artery pressure greater than 65.   2.  Atrial fibrillation with rapid ventricular response.  He has been given Cardizem once in the Emergency Department with initial response; however, heart rate is now above 130 with his goal heart rate will be consistently less than 120.  Given his labile blood pressure we will dose digoxin 0.25 mg now, followed by 0.125 mg q. 6 hours x 2 for digoxin load.  If blood pressure improves and the heart rate is persistently elevated may reconsider using Cardizem.  3.  Elevated troponins.  I suspect this is demand ischemia.  We will trend cardiac enzymes x 3 after his heart rate and blood pressure have improved.  I suspect this will improve; however, if grossly worsens, will need anticoagulation, which is somewhat tenuous given history of gastrointestinal bleed.  4.  Hypertension.  Hold all medications given relative hypotension.  5.  Diabetes.  Hold by mouth agents.  Add insulin sliding scale with q. 6 hour Accu-Cheks.  6.  Venous thromboembolism prophylaxis with heparin subQ.  7.  CODE STATUS:  THE PATIENT IS FULL CODE as discussed at bedside with the patient and family.   Critical care time spent 55 minutes.    ____________________________ Cletis Athens. Yoandri Congrove, MD dkh:ea D: 03/19/2014 23:31:06 ET T: 03/20/2014 00:24:52 ET JOB#: [id number]  cc: Cletis Athens. Nicosha Struve, MD, <Dictator> Maebelle Sulton Synetta Shadow MD ELECTRONICALLY SIGNED 03/20/2014  3:10 

## 2015-04-03 NOTE — Discharge Summary (Signed)
PATIENT NAME:  Austin [name], Austin [name] MR#:  [id number] DATE OF BIRTH:  January 11, 1941  DATE OF ADMISSION:  11/24/2013 DATE OF DISCHARGE:  11/25/2013  PRESENTING COMPLAINT: Hemoptysis.   DISCHARGE DIAGNOSES: 1.  Hemoptysis, suspected due to lingular pneumonia.  2.  Type 2 diabetes.  3.  Chronic emphysema.  4.  Chronic ongoing tobacco abuse.   CONDITION ON DISCHARGE: Fair. Sats 90% to 92% on room air.   CODE STATUS: Full code.   MEDICATIONS: 1.  Levaquin 500 mg p.o. daily.  2.  Amlodipine 5 mg daily.  3.  Metformin 500 mg 2 tablets twice a day.  4.  Lisinopril 40 mg p.o. daily.  5.  Januvia 100 mg p.o. daily.  6.  Lasix 20 mg 2 tablets once a day.  7.  Omeprazole 20 mg 1 capsule twice a day.  8.  Lovastatin 40 mg daily.  9.  Combivent inhaler 2 puffs as needed.  10.  Advair 250/50, 1 puff b.i.d.   The patient advised to stop taking aspirin until the hemoptysis clears up.      Low-sodium, carbohydrate-controlled ADA 1800 calorie diet.   Follow up with Dr. Meredeth [name] in 1 to 2 weeks.   Follow up with your primary care physician at [facility] family practice.   H and H remained stable at 14.0 and 42.8. White count is 7.1. Basic metabolic panel within normal limits. Hemoglobin A1c is 87. CT of the chest without contrast shows bilateral emphysematous changes and hyperinflation. Spiculated nodular scarring in the right upper lobe at the site of previous cavitary nodule measures 1 x 1 cm. Patchy infiltrate in lingula suspicious for nonspecific pneumonia. No pulmonary edema. Lung V/Q scan indeterminate (ventilation/perfusion scan) due to diffuse abnormality.   Ultrasound Doppler bilateral lower extremity negative.   PT-INR is 15.2 and 1.2.   Pulmonary consultation by Dr. Meredeth [name].   Austin [name] is a 74 year old African American gentleman with history of ongoing tobacco abuse and severe emphysema, comes into the Emergency Room with:  1.  Hemoptysis for 2 days. It was suspected due to lingular  pneumonia in the setting of COPD and emphysema. The patient will be given a seven-day course of Levaquin. He remained afebrile.  We will try to get sputum culture sensitivity. He will follow up with Dr. Meredeth [name] as an outpatient. Right upper lobe lung scarring was present. He had a PET scan in the past, which was negative for malignancy at that time. The patient's saturations remained more than 90% on room air. He did not have any drop in his hemoglobin.  2.  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 No signs of wheezing. Continue Advair and his other bronchodilators.  3.  History of PE in the past. The patient was on anticoagulation in the past; however, discontinued due to GI bleed. His lung V/Q scan is indeterminate; however, his Doppler of lower extremities is negative. The patient did not have any symptoms and signs suggestive of PE.  4.  Current smoker.  Smoking cessation was recommended.  5.  Type 2 diabetes. Januvia and metformin were resumed.  6.  Hypertension. Home meds were continued. The patient was advised to follow up with Dr. Meredeth [name] after he finishes up his course of antibiotic as outpatient and/or return to the Emergency Room if his symptoms worsen.  The patient's wife did voice understanding of the discharge planning.   TIME SPENT: 40 minutes.    ____________________________ Wylie Hail Allena Katz, MD sap:dmm D: 11/27/2013 12:01:48 ET T: 11/27/2013 12:27:37 ET  JOB#: [id number]  cc: Austin Peale A. Allena KatzPatel, MD, <Dictator> Austin E. Meredeth IdeFleming, MD [facility] Willow OraSONA A Drake Wuertz MD ELECTRONICALLY SIGNED 01/05/2014 15:04

## 2015-04-05 ENCOUNTER — Ambulatory Visit: Admit: 2015-04-05 | Disposition: A | Discharge status: Home / Self Care | Payer: Self-pay | Admitting: Surgery

## 2015-04-05 LAB — CBC WITH DIFFERENTIAL/PLATELET
BASOS PCT: 1.1 %
Basophil #: 0.1 10*3/uL (ref 0.0–0.1)
EOS PCT: 1.2 %
Eosinophil #: 0.1 10*3/uL (ref 0.0–0.7)
HCT: 43.4 % (ref 40.0–52.0)
HGB: 14.1 g/dL (ref 13.0–18.0)
Lymphocyte #: 0.8 10*3/uL — ABNORMAL LOW (ref 1.0–3.6)
Lymphocyte %: 13.8 %
MCH: 32.5 pg (ref 26.0–34.0)
MCHC: 32.6 g/dL (ref 32.0–36.0)
MCV: 100 fL (ref 80–100)
MONO ABS: 0.5 x10 3/mm (ref 0.2–1.0)
Monocyte %: 9.1 %
NEUTROS ABS: 4.2 10*3/uL (ref 1.4–6.5)
Neutrophil %: 74.8 %
Platelet: 153 10*3/uL (ref 150–440)
RBC: 4.36 10*6/uL — AB (ref 4.40–5.90)
RDW: 15 % — ABNORMAL HIGH (ref 11.5–14.5)
WBC: 5.5 10*3/uL (ref 3.8–10.6)

## 2015-04-05 LAB — BASIC METABOLIC PANEL
Anion Gap: 7 (ref 7–16)
BUN: 12 mg/dL
CREATININE: 1.11 mg/dL
Calcium, Total: 10 mg/dL
Chloride: 104 mmol/L
Co2: 30 mmol/L
GLUCOSE: 108 mg/dL — AB
POTASSIUM: 3.9 mmol/L
Sodium: 141 mmol/L

## 2015-04-09 ENCOUNTER — Ambulatory Visit
Admit: 2015-04-09 | Disposition: A | Discharge status: Home / Self Care | Payer: Self-pay | Attending: Surgery | Admitting: Surgery

## 2015-04-11 ENCOUNTER — Encounter: Payer: Self-pay | Admitting: Emergency Medicine

## 2015-04-11 ENCOUNTER — Emergency Department
Admission: EM | Admit: 2015-04-11 | Discharge: 2015-04-11 | Disposition: A | Discharge status: Home / Self Care | Payer: BLUE CROSS/BLUE SHIELD | Attending: Internal Medicine | Admitting: Internal Medicine

## 2015-04-11 ENCOUNTER — Emergency Department: Payer: BLUE CROSS/BLUE SHIELD

## 2015-04-11 DIAGNOSIS — N508 Other specified disorders of male genital organs: Secondary | ICD-10-CM | POA: Insufficient documentation

## 2015-04-11 DIAGNOSIS — K9161 Intraoperative hemorrhage and hematoma of a digestive system organ or structure complicating a digestive sytem procedure: Secondary | ICD-10-CM | POA: Diagnosis not present

## 2015-04-11 DIAGNOSIS — E119 Type 2 diabetes mellitus without complications: Secondary | ICD-10-CM | POA: Insufficient documentation

## 2015-04-11 DIAGNOSIS — N5089 Other specified disorders of the male genital organs: Secondary | ICD-10-CM

## 2015-04-11 DIAGNOSIS — I82501 Chronic embolism and thrombosis of unspecified deep veins of right lower extremity: Secondary | ICD-10-CM | POA: Diagnosis not present

## 2015-04-11 DIAGNOSIS — M7989 Other specified soft tissue disorders: Secondary | ICD-10-CM

## 2015-04-11 DIAGNOSIS — Y838 Other surgical procedures as the cause of abnormal reaction of the patient, or of later complication, without mention of misadventure at the time of the procedure: Secondary | ICD-10-CM | POA: Insufficient documentation

## 2015-04-11 DIAGNOSIS — I10 Essential (primary) hypertension: Secondary | ICD-10-CM | POA: Diagnosis not present

## 2015-04-11 DIAGNOSIS — Z720 Tobacco use: Secondary | ICD-10-CM | POA: Diagnosis not present

## 2015-04-11 DIAGNOSIS — T888XXA Other specified complications of surgical and medical care, not elsewhere classified, initial encounter: Secondary | ICD-10-CM

## 2015-04-11 DIAGNOSIS — R222 Localized swelling, mass and lump, trunk: Secondary | ICD-10-CM | POA: Diagnosis present

## 2015-04-11 DIAGNOSIS — I825Y1 Chronic embolism and thrombosis of unspecified deep veins of right proximal lower extremity: Secondary | ICD-10-CM

## 2015-04-11 DIAGNOSIS — R52 Pain, unspecified: Secondary | ICD-10-CM

## 2015-04-11 DIAGNOSIS — M79661 Pain in right lower leg: Secondary | ICD-10-CM

## 2015-04-11 HISTORY — DX: Gastro-esophageal reflux disease without esophagitis: K21.9

## 2015-04-11 HISTORY — DX: Type 2 diabetes mellitus without complications: E11.9

## 2015-04-11 HISTORY — DX: Chronic obstructive pulmonary disease, unspecified: J44.9

## 2015-04-11 HISTORY — DX: Pure hypercholesterolemia, unspecified: E78.00

## 2015-04-11 HISTORY — DX: Gastric ulcer, unspecified as acute or chronic, without hemorrhage or perforation: K25.9

## 2015-04-11 HISTORY — DX: Unspecified atrial fibrillation: I48.91

## 2015-04-11 HISTORY — DX: Other pulmonary embolism without acute cor pulmonale: I26.99

## 2015-04-11 HISTORY — DX: Acute embolism and thrombosis of unspecified deep veins of unspecified lower extremity: I82.409

## 2015-04-11 HISTORY — DX: Essential (primary) hypertension: I10

## 2015-04-11 LAB — URINALYSIS COMPLETE WITH MICROSCOPIC (ARMC ONLY)
BILIRUBIN URINE: NEGATIVE
Bacteria, UA: NONE SEEN
Glucose, UA: NEGATIVE mg/dL
KETONES UR: NEGATIVE mg/dL
Leukocytes, UA: NEGATIVE
Nitrite: NEGATIVE
Protein, ur: NEGATIVE mg/dL
SQUAMOUS EPITHELIAL / LPF: NONE SEEN
Specific Gravity, Urine: 1.005 (ref 1.005–1.030)
pH: 7 (ref 5.0–8.0)

## 2015-04-11 LAB — CBC WITH DIFFERENTIAL/PLATELET
Basophils Absolute: 0 10*3/uL (ref 0–0.1)
Eosinophils Absolute: 0 10*3/uL (ref 0–0.7)
HEMATOCRIT: 42.2 % (ref 40.0–52.0)
Hemoglobin: 13.6 g/dL (ref 13.0–18.0)
Lymphocytes Relative: 7 %
Lymphs Abs: 0.7 10*3/uL — ABNORMAL LOW (ref 1.0–3.6)
MCH: 32.2 pg (ref 26.0–34.0)
MCHC: 32.1 g/dL (ref 32.0–36.0)
MCV: 100.1 fL — AB (ref 80.0–100.0)
MONO ABS: 1.4 10*3/uL — AB (ref 0.2–1.0)
Monocytes Relative: 14 %
Neutro Abs: 7.8 10*3/uL — ABNORMAL HIGH (ref 1.4–6.5)
Neutrophils Relative %: 79 %
PLATELETS: 143 10*3/uL — AB (ref 150–440)
RBC: 4.21 MIL/uL — ABNORMAL LOW (ref 4.40–5.90)
RDW: 14.6 % — AB (ref 11.5–14.5)
WBC: 10 10*3/uL (ref 3.8–10.6)

## 2015-04-11 LAB — COMPREHENSIVE METABOLIC PANEL
ALBUMIN: 3.3 g/dL — AB (ref 3.5–5.0)
ALK PHOS: 67 U/L (ref 38–126)
ALT: 7 U/L — AB (ref 17–63)
AST: 20 U/L (ref 15–41)
Anion gap: 8 (ref 5–15)
BILIRUBIN TOTAL: 1.4 mg/dL — AB (ref 0.3–1.2)
BUN: 13 mg/dL (ref 6–20)
CHLORIDE: 100 mmol/L — AB (ref 101–111)
CO2: 28 mmol/L (ref 22–32)
Calcium: 9.9 mg/dL (ref 8.9–10.3)
Creatinine, Ser: 1.19 mg/dL (ref 0.61–1.24)
GFR calc Af Amer: >60 mL/min (ref >60)
GFR calc non Af Amer: 59 mL/min — ABNORMAL LOW (ref >60)
Glucose, Bld: 119 mg/dL — ABNORMAL HIGH (ref 65–99)
POTASSIUM: 4.2 mmol/L (ref 3.5–5.1)
Sodium: 136 mmol/L (ref 135–145)
Total Protein: 6.2 g/dL — ABNORMAL LOW (ref 6.5–8.1)

## 2015-04-11 LAB — LIPASE, BLOOD: Lipase: 20 U/L — ABNORMAL LOW (ref 22–51)

## 2015-04-11 MED ORDER — PIPERACILLIN-TAZOBACTAM 3.375 G IVPB 30 MIN
3.3750 g | Freq: Once | INTRAVENOUS | Status: AC
  Administered 2015-04-11: 3.375 g via INTRAVENOUS
  Filled 2015-04-11: qty 50

## 2015-04-11 MED ORDER — ONDANSETRON HCL 4 MG/2ML IJ SOLN
4.0000 mg | Freq: Once | INTRAMUSCULAR | Status: AC
  Administered 2015-04-11: 4 mg via INTRAVENOUS

## 2015-04-11 MED ORDER — PIPERACILLIN-TAZOBACTAM 3.375 G IVPB
INTRAVENOUS | Status: AC
  Filled 2015-04-11: qty 50

## 2015-04-11 MED ORDER — SODIUM CHLORIDE 0.9 % IV SOLN
1000.0000 mL | INTRAVENOUS | Status: DC
  Administered 2015-04-11: 1000 mL via INTRAVENOUS

## 2015-04-11 MED ORDER — MORPHINE SULFATE 4 MG/ML IJ SOLN
INTRAMUSCULAR | Status: AC
  Filled 2015-04-11: qty 1

## 2015-04-11 MED ORDER — ONDANSETRON HCL 4 MG/2ML IJ SOLN
INTRAMUSCULAR | Status: AC
  Filled 2015-04-11: qty 2

## 2015-04-11 MED ORDER — MORPHINE SULFATE 4 MG/ML IJ SOLN: 4.0000 mg | Freq: Once | INTRAMUSCULAR | Status: DC

## 2015-04-11 MED ORDER — MORPHINE SULFATE 4 MG/ML IJ SOLN
4.0000 mg | Freq: Once | INTRAMUSCULAR | Status: AC
  Administered 2015-04-11: 4 mg via INTRAVENOUS

## 2015-04-11 NOTE — ED Notes (Signed)
Patient had a hernia repair on Friday. Patient c/o swelling to his right groin and testes.

## 2015-04-11 NOTE — ED Notes (Signed)
Pt states he does not want pain meds at this time

## 2015-04-11 NOTE — ED Notes (Signed)
Transported to ultrasound

## 2015-04-11 NOTE — ED Notes (Signed)
Dr. Mindi JunkerGottlieb in room to update patient.

## 2015-04-11 NOTE — ED Provider Notes (Signed)
[facility] Emergency Department Provider Note   ____________________________________________  Time seen: 12:15  I have reviewed the triage vital signs and the nursing notes.   HISTORY  Chief Complaint Groin Swelling and Post-op Problem right groin pain right testicle swelling  right lower extremity redness swelling and pain.      HPI Austin MccreedyJames Allen [name] is a 74 y.o. male here with his wife with a chief complaint of pain and swelling in the right groin, right testicle, shaft of the penis and  and right lower extremity.The swelling stated to get bad yesterday. The pain started 9 days ago after he had hernia repair done by [facility] Kat[facility]. The pain has lasted 9 days but has gotten more severe over the past 1 day as the swelling has increased.   Pain:. Severity is 7 out of 10 severe and continuous. The quality of the pain is throbbing and aching and feels like a pressure in his right groin and also he is having a sensation of tightness and throbbing in the right lower extremity . He has had recent surgery as stated 9 days ago by Dr. Renda RollsWilton Smith.   He is taking narcotic pain medication but this is not helping with the pain since he now has more swelling in the groin. Associated symptoms he has no fever no dysuria he does have redness of the right lower extremity.     Past Medical History  Diagnosis Date  . COPD (chronic obstructive pulmonary disease)   . Gastric ulcer   . DVT (deep venous thrombosis)   . PE (pulmonary embolism)   . Hypertension   . Hypercholesteremia   . Diabetes mellitus without complication   . GERD (gastroesophageal reflux disease)   . Atrial fibrillation     There are no active problems to display for this patient.   Past Surgical History  Procedure Laterality Date  . Hernia repair    . Prostate surgery      No current outpatient prescriptions on file.  Allergies Contrast media  No family  history on file.  Social History History  Substance Use Topics  . Smoking status: Current Every Day Smoker  . Smokeless tobacco: Not on file  . Alcohol Use: No    Review of Systems  Constitutional: Negative for fever. Eyes: Negative for visual changes. ENT: Negative for sore throat. Cardiovascular: Negative for chest pain. Respiratory: Negative for shortness of breath. Gastrointestinal: Negative for abdominal pain, vomiting and diarrhea. Genitourinary: Negative for dysuria. Positive for swelling of the right testicle right groin with ecchymosis and tenderness of the right groin Musculoskeletal: Negative for back pain. Swelling of the right lower extremity Skin: Negative for rash. Neurological: Negative for headaches, focal weakness or numbness. Extremities: Swelling erythema tenderness of the right lower extremity.  10-point ROS otherwise negative.  ____________________________________________   PHYSICAL EXAM:  VITAL SIGNS: ED Triage Vitals  Enc Vitals Group     BP 04/11/15 1139 148/77 mmHg     Pulse Rate 04/11/15 1139 66     Resp 04/11/15 1139 28     Temp 04/11/15 1139 97.9 F (36.6 C)     Temp Source 04/11/15 1139 Oral     SpO2 04/11/15 1139 90 %     Weight 04/11/15 1139 170 lb (77.111 kg)     Height 04/11/15 1139 5\' 10"  (1.778 m)     Head Cir --      Peak Flow --  Pain Score 04/11/15 1145 10     Pain Loc --      Pain Edu? --      Excl. in GC? --     vital Signs are stable. Patient is afebrile.  Constitutional: Alert and oriented. Well appearing and in moderate pain worse with motion. Eyes: Conjunctivae are normal. PERRL. Normal extraocular movements. ENT   Head: Normocephalic and atraumatic.   Nose: No congestion/rhinnorhea.   Mouth/Throat: Mucous membranes are moist.   Neck: No stridor. Hematological/Lymphatic/Immunilogical: No cervical lymphadenopathy. Cardiovascular: Normal rate, regular rhythm. Normal and symmetric distal pulses are  present in all extremities. No murmurs, rubs, or gallops. Respiratory: Normal respiratory effort without tachypnea nor retractions. Breath sounds are clear and equal bilaterally. No wheezes/rales/rhonchi. Gastrointestinal: Soft tender in the right groin erythema to the right groin and the right testy there is an area of induration that measures 8 cm in the right groin with ecchymosis over that area.. No distention. No abdominal bruits. There is no CVA tenderness. Genitourinary: There is scrotal swelling and tenderness on palpation of the right testes. There is edema Musculoskeletal: Pain swelling and erythema of the right lower extremity pulses present 2+ over 4+ No joint effusions.  Neurologic:  Normal speech and language. No gross focal neurologic deficits are appreciated. Speech is normal. No gait instability. Skin:  Skin is warm, dry and intact. Erythema as noted to the right lower extremity and right groin. No rash noted. Psychiatric: Mood and affect are normal. Speech and behavior are normal. Patient exhibits appropriate insight and judgment.  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 ____________________________________________   PROCEDURES  Procedure(s) performed:   Critical Care performed: No  ____________________________________________   INITIAL IMPRESSION / ASSESSMENT AND PLAN / ED COURSE  Pertinent labs & imaging results that were available during my care of the patient were reviewed by me and considered in my medical decision making (see chart for details).  #1. Right  groin pain and swelling. #2. Right lower extremity erythema and swelling.  #3. Pain right groin and right lower extremity.  ----------------------------------------- 12:54 PM on 04/11/2015 ----------------------------------------- IV started by the nurse and fluids started normal saline at 125 cc an hour. Analgesic pain medication administered IV morphine 4 mg IV with adequate pain control. The CAT scan reveals a postoperative hematoma. Consult was placed for Dr. Renda Rolls. ----------------------------------------- 4:19 PM on 04/11/2015 -----------------------------------------  Dr. Marily Lente was in the emergency department to consult on the patient for Dr. Stephens November. Dr. Doristine Counter has evaluated the patient in the emergency department and has determined that the swelling is normal given the extensive nature of the surgical repair and insertion of the mesh. There is no sign of infection white count is normal patient is afebrile. Plan is to dismiss the patient with instructions to apply warm compresses to the groin area which is swelling and to elevate the leg to decrease swelling. There is no sign of a DVT active DVT on the Doppler ultrasound. ____________________________________________   FINAL CLINICAL IMPRESSION(S) / ED DIAGNOSES  Final diagnoses:  Pain  Pain and swelling of right lower leg  Post op Hematoma right groin  Chronic dvt right lower extremity Scrotal edema.  Sherlyn Hay,  DO 04/11/15 1647

## 2015-04-11 NOTE — Op Note (Addendum)
PATIENT NAME:  [name], Austin [name] DATE OF BIRTH:  [birth year]  DATE OF PROCEDURE:  04/09/2015  PREOPERATIVE DIAGNOSIS: Right inguinal hernia.   POSTOPERATIVE DIAGNOSIS: Right inguinal hernia.   PROCEDURE: Right inguinal hernia repair.   SURGEON: Adella HareJ. Wilton Smith, MD   ANESTHESIA: General.   INDICATIONS: This 74 year old male has a history of right inguinal hernia and physical exam demonstrated that the bulge extended down far into the right scrotum and was reducible. A repair was recommended for definitive treatment.   DESCRIPTION OF PROCEDURE: The patient was placed on the operating table in the supine position under general anesthesia. The hernia was reduced. The skin was clipped and prepared with ChloraPrep and draped in a sterile manner.   A transversely oriented right suprapubic incision was made some 8 cm in length and carried down through subcutaneous tissues. One traversing vein was suture ligated with 4-0 chromic and divided. Scarpa fascia was incised. Numerous small bleeding points were cauterized. The external ring was identified. The external oblique aponeurosis was incised along the course of its fibers to open the external ring and expose the inguinal cord structures. The cord structures were mobilized. Cremaster fibers were incised. There was a large indirect hernia sac, which was approximately 18 cm long and was dissected free from surrounding structures. The sac was opened so that fingers could be inserted in the sac for traction. The sac was dissected up to the internal ring. There was no bowel within the sac. A high ligation of the sac was done with a 4-0 Vicryl suture ligature, and was doubly ligated, and the sac was excised and was not sent for pathology. Next, a repair was carried out with a row of 0 Surgilon sutures, suturing the conjoined tendon to the shelving edge of the inguinal ligament, incorporating transversalis fascia into the repair. The last stitch led  to satisfactory narrowing of the internal ring. There was some fatty tissue overlying the conjoined tendon, which was excised. A Bard soft mesh was cut to create an oval shape of some 3 x 6 cm in dimension and a notch cut out to straddle the internal ring, and was sutured to the repair with 0 Surgilon, also was sutured medially to the fascia and sutured on both sides of the internal ring. Hemostasis was intact. The cut edges of the external oblique aponeurosis were approximated with running 4-0 Vicryl. The deep fascia superior and lateral to the repair site was infiltrated with 3 mL of 1% tetracaine. The Scarpa fascia was closed with interrupted 4-0 Vicryl. The skin was closed with running 4-0 Monocryl subcuticular suture and LiquiBand. The testicle remained in the scrotum. The patient appeared to be in satisfactory condition and was prepared for transfer to the recovery room.     ____________________________ Shela CommonsJ. Renda RollsWilton Smith, MD jws:mw D: 04/09/2015 12:34:16 ET T: 04/09/2015 16:36:50 ET JOB#: [id number]  cc: Adella HareJ. Wilton Smith, MD, <Dictator> Adella HareWILTON J SMITH MD ELECTRONICALLY SIGNED 04/12/2015 19:05

## 2015-04-11 NOTE — ED Notes (Signed)
Pt returned from ct scann at this time. Pt resting in nad with wife at bedside awaiting results.

## 2015-04-11 NOTE — ED Notes (Signed)
Pt presents today with cc of pain s/p hernia surgery on this past Friday. Pt states by Saturday afternoon he was in much worse pain with swelling. Pt has swelling and redness in rt lower abd, swelling in rt testicle. Testicle very tender to touch. Pt also has redness, swelling and pain to rt lower extremity. Pt resting in bed in nad at this time with family at bedside

## 2015-04-11 NOTE — Consult Note (Signed)
Reason for Consult: Postoperative swelling Referring Physician: Ferman Hamming, D.O.   HPI: This 74 year old patient underwent repair of a long-standing right inguinal hernia by an open technique 48 hours ago. His wife reports that they noted swelling in the surgical site and along into the groin last evening. This is gradually increased since that time. The patient has had no difficulty with eating or voiding. He has not had a bowel movement since surgery.  The patient's wife reported that surgery was originally considered last year, but was deferred due to other medical issues. The patient reports he is unable to lie supine due to shortness of breath. This is his baseline.   Past Medical History  Diagnosis Date  . COPD (chronic obstructive pulmonary disease)   . Gastric ulcer   . DVT (deep venous thrombosis)   . PE (pulmonary embolism)   . Hypertension   . Hypercholesteremia   . Diabetes mellitus without complication   . GERD (gastroesophageal reflux disease)   . Atrial fibrillation     Past Surgical History  Procedure Laterality Date  . Hernia repair    . Prostate surgery      No family history on file.  Social History:  reports that he has been smoking.  He does not have any smokeless tobacco history on file. He reports that he does not drink alcohol or use illicit drugs.  Allergies:  Allergies  Allergen Reactions  . Contrast Media [Iodinated Diagnostic Agents] Nausea And Vomiting    Medications: I have reviewed the patient's current medications.  Results for orders placed or performed during the hospital encounter of 04/11/15 (from the past 48 hour(s))  Urinalysis complete, with microscopic [facility])     Status: Abnormal   Collection Time: 04/11/15 12:25 PM  Result Value Ref Range   Color, Urine YELLOW (A) YELLOW   APPearance CLEAR (A) CLEAR   Glucose, UA NEGATIVE NEGATIVE mg/dL   Bilirubin Urine NEGATIVE NEGATIVE   Ketones, ur NEGATIVE NEGATIVE mg/dL   Specific  Gravity, Urine 1.005 1.005 - 1.030   Hgb urine dipstick 3+ (A) NEGATIVE   pH 7.0 5.0 - 8.0   Protein, ur NEGATIVE NEGATIVE mg/dL   Nitrite NEGATIVE NEGATIVE   Leukocytes, UA NEGATIVE NEGATIVE   RBC / HPF 6-30 0 - 5 RBC/hpf   WBC, UA 0-5 0 - 5 WBC/hpf   Bacteria, UA NONE SEEN NONE SEEN   Squamous Epithelial / LPF NONE SEEN NONE SEEN  CBC WITH DIFFERENTIAL     Status: Abnormal   Collection Time: 04/11/15 12:25 PM  Result Value Ref Range   WBC 10.0 3.8 - 10.6 K/uL   RBC 4.21 (L) 4.40 - 5.90 MIL/uL   Hemoglobin 13.6 13.0 - 18.0 g/dL   HCT 42.2 40.0 - 52.0 %   MCV 100.1 (H) 80.0 - 100.0 fL   MCH 32.2 26.0 - 34.0 pg   MCHC 32.1 32.0 - 36.0 g/dL   RDW 14.6 (H) 11.5 - 14.5 %   Platelets 143 (L) 150 - 440 K/uL   Neutrophils Relative % 79% %   Neutro Abs 7.8 (H) 1.4 - 6.5 K/uL   Lymphocytes Relative 7% %   Lymphs Abs 0.7 (L) 1.0 - 3.6 K/uL   Monocytes Relative 14% %   Monocytes Absolute 1.4 (H) 0.2 - 1.0 K/uL   Eosinophils Relative 0% %   Eosinophils Absolute 0.0 0 - 0.7 K/uL   Basophils Relative 0% %   Basophils Absolute 0.0 0 - 0.1 K/uL  Comprehensive metabolic  panel     Status: Abnormal   Collection Time: 04/11/15 12:25 PM  Result Value Ref Range   Sodium 136 135 - 145 mmol/L   Potassium 4.2 3.5 - 5.1 mmol/L   Chloride 100 (L) 101 - 111 mmol/L   CO2 28 22 - 32 mmol/L   Glucose, Bld 119 (H) 65 - 99 mg/dL   BUN 13 6 - 20 mg/dL   Creatinine, Ser 1.19 0.61 - 1.24 mg/dL   Calcium 9.9 8.9 - 10.3 mg/dL   Total Protein 6.2 (L) 6.5 - 8.1 g/dL   Albumin 3.3 (L) 3.5 - 5.0 g/dL   AST 20 15 - 41 U/L   ALT 7 (L) 17 - 63 U/L   Alkaline Phosphatase 67 38 - 126 U/L   Total Bilirubin 1.4 (H) 0.3 - 1.2 mg/dL   GFR calc non Af Amer 59 (L) >60 mL/min   GFR calc Af Amer >60 >60 mL/min    Comment: (NOTE) The eGFR has been calculated using the CKD EPI equation. This calculation has not been validated in all clinical situations. eGFR's persistently <90 mL/min signify possible Chronic  Kidney Disease.    Anion gap 8 5 - 15  Lipase, blood     Status: Abnormal   Collection Time: 04/11/15 12:25 PM  Result Value Ref Range   Lipase 20 (L) 22 - 51 U/L    Ct Abdomen Pelvis Wo Contrast  04/11/2015   CLINICAL DATA:  Right inguinal hernia surgery 2 days ago. Severe pain subsequently.  EXAM: CT ABDOMEN AND PELVIS WITHOUT CONTRAST  TECHNIQUE: Multidetector CT imaging of the abdomen and pelvis was performed following the standard protocol without IV contrast.  COMPARISON:  None.  FINDINGS: The lungs show diffuse emphysema. There are bilateral pleural effusions with dependent pulmonary atelectasis bilaterally. The heart is enlarged.  The liver appears normal without contrast. Previous cholecystectomy. The spleen is normal. The pancreas is normal. There may be mild adrenal hyperplasia. The right kidney is normal. The left kidney contains multiple large stones but there is no evidence of hydronephrosis or passing stone. The aorta shows atherosclerosis but there is no aneurysm. The IVC is normal. No retroperitoneal mass or lymphadenopathy. No evidence of bowel obstruction or free air.  The patient has had recent right inguinal hernia repair. Air is present in the soft tissues that region consistent with recent surgery. There is some hyperdense material in the subcutaneous fat consistent with hematoma. There is edema throughout the spermatic cord region on the right. This can be seen acutely following inguinal hernia repair, but by CT, one could not exclude postoperative hemorrhage or infection. No bowel or mesentery is seen extending into the right scrotum.  Bladder is unremarkable. Prostate hypertrophy again noted. Chronic degenerative changes again seen in the lower lumbar spine.  IMPRESSION: Swelling in the region of the right inguinal hernia repair, with some hyperdense material in the subcutaneous fat in the region of the surgery likely to represent postoperative hematoma.  Swelling of the spermatic  cord and scrotal contents on the right. This can be seen in acutely following hernia repair. By CT, one could not exclude the possibility of hemorrhage or inflammation.   Electronically Signed   By: Nelson Chimes M.D.   On: 04/11/2015 15:20   US Venous Img Lower Unilateral Right  04/11/2015   CLINICAL DATA:  Right leg pain for 2 days and swelling for 5 months. Two days postop from hernia repair.  EXAM: RIGHT LOWER EXTREMITY VENOUS DOPPLER ULTRASOUND  TECHNIQUE: Gray-scale sonography with graded compression, as well as color Doppler and duplex ultrasound were performed to evaluate the lower extremity deep venous systems from the level of the common femoral vein and including the common femoral, femoral, profunda femoral, popliteal and calf veins including the posterior tibial, peroneal and gastrocnemius veins when visible. The superficial great saphenous vein was also interrogated. Spectral Doppler was utilized to evaluate flow at rest and with distal augmentation maneuvers in the common femoral, femoral and popliteal veins.  COMPARISON:  11/24/2013  FINDINGS: Contralateral Common Femoral Vein: Respiratory phasicity is normal and symmetric with the symptomatic side. No evidence of thrombus. Normal compressibility.  Common Femoral Vein: No evidence of thrombus. Normal compressibility, respiratory phasicity and response to augmentation.  Saphenofemoral Junction: No evidence of thrombus. Normal compressibility and flow on color Doppler imaging.  Profunda Femoral Vein: No evidence of thrombus. Normal compressibility and flow on color Doppler imaging.  Femoral Vein: No evidence of thrombus. Normal compressibility, respiratory phasicity and response to augmentation.  Popliteal Vein: Incompletely compressible. Contains nonocclusive linear filling defect which shows some calcification. This is new since previous study in 2014, but has appearance of chronic DVT.  Calf Veins: No evidence of thrombus. Normal compressibility  and flow on color Doppler imaging.  Superficial Great Saphenous Vein: No evidence of thrombus. Normal compressibility and flow on color Doppler imaging.  Venous Reflux:  None.  Other Findings:  None.  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 No definite acute DVT identified.   Electronically Signed   By: Earle Gell M.D.   On: 04/11/2015 14:45    ROS Blood pressure 134/87, pulse 60, temperature 97.9 F (36.6 C), temperature source Oral, resp. rate 20, height _0  (1.778 m), weight 170 lb (77.111 kg), SpO2 93 %. Physical Exam Examination showed the patient to be awake alert and orientated.  Chest exam showed rhonchi in both lower lobes.  Cardiac exam showed an irregular rhythm.  Abdomen exam showed be soft and nontender.  Examination of the right groin showed the recent incision that is clean without evidence of erythema. There is moderate swelling extending from below the incision down to the base of the cord. The testicle was not involved. Swelling involves the right hemiscrotum as well as the penis. No significant discoloration.  Examination of the extremities shows chronic venous stasis changes bilaterally.   Assessment/Plan: Postop edema secondary to chronic congestive heart failure, increased cough post intubation for surgery as well as long-standing hernia defect with a large "dead space" prone to fluid accumulation.  CT scan was reviewed. No drainable fluid collections on my review. This is primarily swelling along the cord and adjacent structures more so than hematoma.  Conservative measures with elevation of the scrotum on a folded towel, local heat and time should resolve this issue.  The patient's present follow-up appointment with Dr. Tamala Julian is for 04/22/2015. The family had been encouraged to contact Dr. Thompson Caul office  for consideration of earlier assessment.  Robert Bellow 04/11/2015, 6:36 PM

## 2015-04-11 NOTE — ED Notes (Signed)
Pt remains in ct at this time

## 2015-04-11 NOTE — ED Notes (Signed)
Surgeon in room to evaluate patient, patient resting comfortably in bed, wife at bedside. No complaints at this time. Will continue to monitor.

## 2015-04-11 NOTE — ED Notes (Signed)
Blood was drawn from iv site on iv start

## 2015-04-11 NOTE — Discharge Instructions (Signed)
Warm compresses to the groin 3 times daily. Elevate the lower extremities. Pain medication as directed by Dr. Renda RollsWilton Smith. Fever increased pain or redness drainage from the wound or any other signs of infection return to the emergency department or call Dr. Renda RollsWilton Smith

## 2015-04-16 LAB — CULTURE, BLOOD (ROUTINE X 2)
CULTURE: NO GROWTH
Culture: NO GROWTH
SPECIAL REQUESTS: NORMAL
Special Requests: NORMAL

## 2015-07-04 ENCOUNTER — Encounter: Payer: Self-pay | Admitting: Internal Medicine

## 2015-07-04 ENCOUNTER — Emergency Department: Payer: BLUE CROSS/BLUE SHIELD

## 2015-07-04 ENCOUNTER — Inpatient Hospital Stay
Admission: EM | Admit: 2015-07-04 | Discharge: 2015-07-08 | DRG: 291 | Disposition: A | Discharge status: Home / Self Care | Payer: BLUE CROSS/BLUE SHIELD | Attending: Internal Medicine | Admitting: Internal Medicine

## 2015-07-04 DIAGNOSIS — F17210 Nicotine dependence, cigarettes, uncomplicated: Secondary | ICD-10-CM | POA: Diagnosis present

## 2015-07-04 DIAGNOSIS — J9621 Acute and chronic respiratory failure with hypoxia: Secondary | ICD-10-CM | POA: Diagnosis present

## 2015-07-04 DIAGNOSIS — I248 Other forms of acute ischemic heart disease: Secondary | ICD-10-CM | POA: Diagnosis present

## 2015-07-04 DIAGNOSIS — N179 Acute kidney failure, unspecified: Secondary | ICD-10-CM | POA: Diagnosis present

## 2015-07-04 DIAGNOSIS — R7989 Other specified abnormal findings of blood chemistry: Secondary | ICD-10-CM

## 2015-07-04 DIAGNOSIS — Z86718 Personal history of other venous thrombosis and embolism: Secondary | ICD-10-CM | POA: Diagnosis not present

## 2015-07-04 DIAGNOSIS — Z8711 Personal history of peptic ulcer disease: Secondary | ICD-10-CM

## 2015-07-04 DIAGNOSIS — J441 Chronic obstructive pulmonary disease with (acute) exacerbation: Secondary | ICD-10-CM | POA: Diagnosis present

## 2015-07-04 DIAGNOSIS — Z9889 Other specified postprocedural states: Secondary | ICD-10-CM

## 2015-07-04 DIAGNOSIS — R634 Abnormal weight loss: Secondary | ICD-10-CM | POA: Diagnosis present

## 2015-07-04 DIAGNOSIS — E119 Type 2 diabetes mellitus without complications: Secondary | ICD-10-CM | POA: Diagnosis present

## 2015-07-04 DIAGNOSIS — K219 Gastro-esophageal reflux disease without esophagitis: Secondary | ICD-10-CM | POA: Diagnosis present

## 2015-07-04 DIAGNOSIS — E785 Hyperlipidemia, unspecified: Secondary | ICD-10-CM | POA: Diagnosis present

## 2015-07-04 DIAGNOSIS — E780 Pure hypercholesterolemia: Secondary | ICD-10-CM | POA: Diagnosis present

## 2015-07-04 DIAGNOSIS — I495 Sick sinus syndrome: Secondary | ICD-10-CM | POA: Diagnosis present

## 2015-07-04 DIAGNOSIS — Z8249 Family history of ischemic heart disease and other diseases of the circulatory system: Secondary | ICD-10-CM | POA: Diagnosis not present

## 2015-07-04 DIAGNOSIS — Z716 Tobacco abuse counseling: Secondary | ICD-10-CM | POA: Diagnosis present

## 2015-07-04 DIAGNOSIS — L0390 Cellulitis, unspecified: Secondary | ICD-10-CM | POA: Diagnosis present

## 2015-07-04 DIAGNOSIS — I5033 Acute on chronic diastolic (congestive) heart failure: Secondary | ICD-10-CM | POA: Diagnosis present

## 2015-07-04 DIAGNOSIS — I480 Paroxysmal atrial fibrillation: Secondary | ICD-10-CM | POA: Diagnosis present

## 2015-07-04 DIAGNOSIS — R778 Other specified abnormalities of plasma proteins: Secondary | ICD-10-CM

## 2015-07-04 DIAGNOSIS — Z91041 Radiographic dye allergy status: Secondary | ICD-10-CM | POA: Diagnosis not present

## 2015-07-04 DIAGNOSIS — R001 Bradycardia, unspecified: Secondary | ICD-10-CM | POA: Diagnosis present

## 2015-07-04 DIAGNOSIS — Z86711 Personal history of pulmonary embolism: Secondary | ICD-10-CM

## 2015-07-04 DIAGNOSIS — N189 Chronic kidney disease, unspecified: Secondary | ICD-10-CM | POA: Diagnosis present

## 2015-07-04 DIAGNOSIS — I129 Hypertensive chronic kidney disease with stage 1 through stage 4 chronic kidney disease, or unspecified chronic kidney disease: Secondary | ICD-10-CM | POA: Diagnosis present

## 2015-07-04 DIAGNOSIS — I5084 End stage heart failure: Secondary | ICD-10-CM

## 2015-07-04 DIAGNOSIS — E876 Hypokalemia: Secondary | ICD-10-CM | POA: Diagnosis present

## 2015-07-04 DIAGNOSIS — Z79899 Other long term (current) drug therapy: Secondary | ICD-10-CM | POA: Diagnosis not present

## 2015-07-04 DIAGNOSIS — I429 Cardiomyopathy, unspecified: Secondary | ICD-10-CM | POA: Diagnosis present

## 2015-07-04 DIAGNOSIS — R0902 Hypoxemia: Secondary | ICD-10-CM

## 2015-07-04 DIAGNOSIS — I509 Heart failure, unspecified: Secondary | ICD-10-CM

## 2015-07-04 LAB — CBC WITH DIFFERENTIAL/PLATELET
BASOS PCT: 1 %
Basophils Absolute: 0.1 10*3/uL (ref 0–0.1)
EOS PCT: 1 %
Eosinophils Absolute: 0 10*3/uL (ref 0–0.7)
HCT: 41.7 % (ref 40.0–52.0)
Hemoglobin: 13.3 g/dL (ref 13.0–18.0)
LYMPHS PCT: 12 %
Lymphs Abs: 0.6 10*3/uL — ABNORMAL LOW (ref 1.0–3.6)
MCH: 30.6 pg (ref 26.0–34.0)
MCHC: 32 g/dL (ref 32.0–36.0)
MCV: 95.8 fL (ref 80.0–100.0)
Monocytes Absolute: 0.4 10*3/uL (ref 0.2–1.0)
Monocytes Relative: 9 %
Neutro Abs: 3.7 10*3/uL (ref 1.4–6.5)
Neutrophils Relative %: 77 %
PLATELETS: 131 10*3/uL — AB (ref 150–440)
RBC: 4.36 MIL/uL — ABNORMAL LOW (ref 4.40–5.90)
RDW: 16 % — ABNORMAL HIGH (ref 11.5–14.5)
WBC: 4.8 10*3/uL (ref 3.8–10.6)

## 2015-07-04 LAB — BASIC METABOLIC PANEL
Anion gap: 11 (ref 5–15)
BUN: 18 mg/dL (ref 6–20)
CO2: 28 mmol/L (ref 22–32)
Calcium: 9.6 mg/dL (ref 8.9–10.3)
Chloride: 101 mmol/L (ref 101–111)
Creatinine, Ser: 1.53 mg/dL — ABNORMAL HIGH (ref 0.61–1.24)
GFR, EST AFRICAN AMERICAN: 50 mL/min — AB (ref >60)
GFR, EST NON AFRICAN AMERICAN: 43 mL/min — AB (ref >60)
Glucose, Bld: 100 mg/dL — ABNORMAL HIGH (ref 65–99)
POTASSIUM: 3 mmol/L — AB (ref 3.5–5.1)
SODIUM: 140 mmol/L (ref 135–145)

## 2015-07-04 LAB — TROPONIN I
TROPONIN I: 0.1 ng/mL — AB (ref <0.031)
TROPONIN I: 0.11 ng/mL — AB (ref <0.031)
TROPONIN I: 0.09 ng/mL — AB (ref <0.031)
Troponin I: 0.11 ng/mL — ABNORMAL HIGH (ref <0.031)

## 2015-07-04 LAB — GLUCOSE, CAPILLARY
GLUCOSE-CAPILLARY: 50 mg/dL — AB (ref 65–99)
GLUCOSE-CAPILLARY: 71 mg/dL (ref 65–99)
Glucose-Capillary: 58 mg/dL — ABNORMAL LOW (ref 65–99)
Glucose-Capillary: 93 mg/dL (ref 65–99)

## 2015-07-04 LAB — BRAIN NATRIURETIC PEPTIDE: B Natriuretic Peptide: 2037 pg/mL — ABNORMAL HIGH (ref 0.0–100.0)

## 2015-07-04 MED ORDER — NITROGLYCERIN 0.4 MG SL SUBL: 0.4000 mg | SUBLINGUAL_TABLET | SUBLINGUAL | Status: DC | PRN

## 2015-07-04 MED ORDER — FUROSEMIDE 10 MG/ML IJ SOLN
60.0000 mg | Freq: Once | INTRAMUSCULAR | Status: AC
  Administered 2015-07-04: 60 mg via INTRAVENOUS
  Filled 2015-07-04: qty 8

## 2015-07-04 MED ORDER — LINAGLIPTIN 5 MG PO TABS
5.0000 mg | ORAL_TABLET | Freq: Every day | ORAL | Status: DC
  Administered 2015-07-05 – 2015-07-08 (×4): 5 mg via ORAL
  Filled 2015-07-04 (×4): qty 1

## 2015-07-04 MED ORDER — POTASSIUM CHLORIDE CRYS ER 20 MEQ PO TBCR
20.0000 meq | EXTENDED_RELEASE_TABLET | Freq: Two times a day (BID) | ORAL | Status: DC
  Administered 2015-07-04 – 2015-07-08 (×9): 20 meq via ORAL
  Filled 2015-07-04 (×9): qty 1

## 2015-07-04 MED ORDER — IPRATROPIUM-ALBUTEROL 0.5-2.5 (3) MG/3ML IN SOLN
3.0000 mL | Freq: Four times a day (QID) | RESPIRATORY_TRACT | Status: DC
  Administered 2015-07-04 – 2015-07-08 (×15): 3 mL via RESPIRATORY_TRACT
  Filled 2015-07-04 (×17): qty 3

## 2015-07-04 MED ORDER — FUROSEMIDE 10 MG/ML IJ SOLN
20.0000 mg | Freq: Three times a day (TID) | INTRAMUSCULAR | Status: DC
  Administered 2015-07-04 – 2015-07-05 (×3): 20 mg via INTRAVENOUS
  Filled 2015-07-04 (×3): qty 2

## 2015-07-04 MED ORDER — PANTOPRAZOLE SODIUM 40 MG PO TBEC
40.0000 mg | DELAYED_RELEASE_TABLET | Freq: Every day | ORAL | Status: DC
  Administered 2015-07-05 – 2015-07-08 (×4): 40 mg via ORAL
  Filled 2015-07-04 (×4): qty 1

## 2015-07-04 MED ORDER — INSULIN ASPART 100 UNIT/ML ~~LOC~~ SOLN
0.0000 [IU] | Freq: Three times a day (TID) | SUBCUTANEOUS | Status: DC
Start: 2015-07-04 — End: 2015-07-08
  Administered 2015-07-06: 3 [IU] via SUBCUTANEOUS
  Administered 2015-07-06: 2 [IU] via SUBCUTANEOUS
  Administered 2015-07-06 – 2015-07-07 (×4): 3 [IU] via SUBCUTANEOUS
  Administered 2015-07-08 (×2): 5 [IU] via SUBCUTANEOUS
  Filled 2015-07-04 (×5): qty 3
  Filled 2015-07-04 (×2): qty 5
  Filled 2015-07-04: qty 2
  Filled 2015-07-04: qty 3

## 2015-07-04 MED ORDER — LISINOPRIL 20 MG PO TABS
40.0000 mg | ORAL_TABLET | Freq: Every day | ORAL | Status: DC
  Administered 2015-07-05 – 2015-07-08 (×4): 40 mg via ORAL
  Filled 2015-07-04 (×5): qty 2

## 2015-07-04 MED ORDER — PRAVASTATIN SODIUM 20 MG PO TABS
40.0000 mg | ORAL_TABLET | Freq: Every day | ORAL | Status: DC
  Administered 2015-07-04 – 2015-07-07 (×4): 40 mg via ORAL
  Filled 2015-07-04 (×4): qty 2

## 2015-07-04 MED ORDER — NICOTINE 21 MG/24HR TD PT24
21.0000 mg | MEDICATED_PATCH | Freq: Every day | TRANSDERMAL | Status: DC
  Administered 2015-07-04 – 2015-07-08 (×5): 21 mg via TRANSDERMAL
  Filled 2015-07-04 (×5): qty 1

## 2015-07-04 MED ORDER — HEPARIN SODIUM (PORCINE) 5000 UNIT/ML IJ SOLN
5000.0000 [IU] | Freq: Three times a day (TID) | INTRAMUSCULAR | Status: DC
  Administered 2015-07-04 – 2015-07-08 (×12): 5000 [IU] via SUBCUTANEOUS
  Filled 2015-07-04 (×12): qty 1

## 2015-07-04 MED ORDER — ASPIRIN 81 MG PO CHEW
324.0000 mg | CHEWABLE_TABLET | Freq: Once | ORAL | Status: AC
Start: 2015-07-04 — End: 2015-07-04
  Administered 2015-07-04: 324 mg via ORAL
  Filled 2015-07-04: qty 4

## 2015-07-04 MED ORDER — BUDESONIDE-FORMOTEROL FUMARATE 160-4.5 MCG/ACT IN AERO
2.0000 | INHALATION_SPRAY | Freq: Two times a day (BID) | RESPIRATORY_TRACT | Status: DC
  Administered 2015-07-04 – 2015-07-08 (×8): 2 via RESPIRATORY_TRACT
  Filled 2015-07-04: qty 6

## 2015-07-04 NOTE — H&P (Signed)
[facility] - [place] at [facility]   PATIENT NAME: Austin [name]    MR#:  [id number]  DATE OF BIRTH:  November 08, 1941  DATE OF ADMISSION:  07/04/2015  PRIMARY CARE PHYSICIAN: Kristeen Miss, MD   REQUESTING/REFERRING PHYSICIAN: Governor Rooks  CHIEF COMPLAINT:   Chief Complaint  Patient presents with  . Shortness of Breath    HISTORY OF PRESENT ILLNESS: Austin [name]  is a 74 y.o. male with a known history of COPD, DVT and pulmonary embolism was on anticoagulation but taken off because of GI bleeding a few years ago, hypertension, hypercholesterolemia, diabetes, atrial fibrillation, diastolic congestive heart failure was with Dr. Juliann Pares. Says that for last few months almost a year his functional status is decreased because of gradual short of breath on exertion, he also has chronic swelling of both his lower extremities, so she mainly stays at home and walks a few steps only. In June when he saw Dr. Juliann Pares in office he increased his dose of Lasix. For last few days he complains of feeling more short of breath even at rest that prompted him to come to emergency room today. He denies any complain of a cough or fever or chest pain.  PAST MEDICAL HISTORY:   Past Medical History  Diagnosis Date  . COPD (chronic obstructive pulmonary disease)   . Gastric ulcer   . DVT (deep venous thrombosis)   . PE (pulmonary embolism)   . Hypertension   . Hypercholesteremia   . Diabetes mellitus without complication   . GERD (gastroesophageal reflux disease)   . Atrial fibrillation     PAST SURGICAL HISTORY:  Past Surgical History  Procedure Laterality Date  . Hernia repair    . Prostate surgery      SOCIAL HISTORY:  History  Substance Use Topics  . Smoking status: Current Every Day Smoker -- 1.00 packs/day  . Smokeless tobacco: Not on file  . Alcohol Use: No    FAMILY HISTORY:  Family History  Problem Relation Age of Onset  . CAD Brother   . Congestive  Heart Failure Brother     DRUG ALLERGIES:  Allergies  Allergen Reactions  . Contrast Media [Iodinated Diagnostic Agents] Nausea And Vomiting    REVIEW OF SYSTEMS:   CONSTITUTIONAL: No fever, fatigue or weakness.  EYES: No blurred or double vision.  EARS, NOSE, AND THROAT: No tinnitus or ear pain.  RESPIRATORY: No cough, shortness of breath, wheezing or hemoptysis.  CARDIOVASCULAR: No chest pain, orthopnea,positive edema. Positive SOB on exertion. GASTROINTESTINAL: No nausea, vomiting, diarrhea or abdominal pain.  GENITOURINARY: No dysuria, hematuria.  ENDOCRINE: No polyuria, nocturia,  HEMATOLOGY: No anemia, easy bruising or bleeding SKIN: No rash or lesion. MUSCULOSKELETAL: No joint pain or arthritis.  Edema on legs. NEUROLOGIC: No tingling, numbness, weakness.  PSYCHIATRY: No anxiety or depression.   MEDICATIONS AT HOME:  Prior to Admission medications   Medication Sig Start Date End Date Taking? Authorizing Provider  budesonide-formoterol (SYMBICORT) 160-4.5 MCG/ACT inhaler Inhale 2 puffs into the lungs 2 (two) times daily.    Historical Provider, MD  furosemide (LASIX) 40 MG tablet Take 40 mg by mouth daily.    Historical Provider, MD  HYDROcodone-acetaminophen (NORCO/VICODIN) 5-325 MG per tablet Take 1 tablet by mouth every 4 (four) hours as needed for moderate pain.    Historical Provider, MD  lisinopril (PRINIVIL,ZESTRIL) 40 MG tablet Take 40 mg by mouth daily.    Historical Provider, MD  lovastatin (MEVACOR) 40 MG tablet Take 40  mg by mouth at bedtime. Pt should take with food.    Historical Provider, MD  metoprolol tartrate (LOPRESSOR) 25 MG tablet Take 25 mg by mouth daily.    Historical Provider, MD  sitaGLIPtin (JANUVIA) 100 MG tablet Take 100 mg by mouth daily.    Historical Provider, MD  Tiotropium Bromide Monohydrate 2.5 MCG/ACT AERS Inhale 2 puffs into the lungs daily.    Historical Provider, MD      PHYSICAL EXAMINATION:   VITAL SIGNS: Blood pressure 121/87,  pulse 59, temperature 97.6 F (36.4 C), temperature source Oral, resp. rate 18, height 6' (1.829 m), SpO2 99 %.  GENERAL:  74 y.o.-year-old patient lying in the bed with no acute distress.  EYES: Pupils equal, round, reactive to light and accommodation. No scleral icterus. Extraocular muscles intact.  HEENT: Head atraumatic, normocephalic. Oropharynx and nasopharynx clear.  NECK:  Supple, no jugular venous distention. No thyroid enlargement, no tenderness.  LUNGS: Normal breath sounds bilaterally, no wheezing, rales,rhonchi , positive for mild crepitation. No use of accessory muscles of respiration. Using nasal canula oxygen. CARDIOVASCULAR: S1, S2 normal. No murmurs, rubs, or gallops.  ABDOMEN: Soft, nontender, nondistended. Bowel sounds present. No organomegaly or mass.  EXTREMITIES: chronic thick pedal edema, no cyanosis, or clubbing.  NEUROLOGIC: Cranial nerves II through XII are intact. Muscle strength 4/5 in all extremities. Sensation intact. Gait not checked.  PSYCHIATRIC: The patient is alert and oriented x 3.  SKIN: No obvious rash, lesion, or ulcer.   LABORATORY PANEL:   CBC  Recent Labs Lab 07/04/15 1043  WBC 4.8  HGB 13.3  HCT 41.7  PLT 131*  MCV 95.8  MCH 30.6  MCHC 32.0  RDW 16.0*  LYMPHSABS 0.6*  MONOABS 0.4  EOSABS 0.0  BASOSABS 0.1   ------------------------------------------------------------------------------------------------------------------  Chemistries   Recent Labs Lab 07/04/15 1043  NA 140  K 3.0*  CL 101  CO2 28  GLUCOSE 100*  BUN 18  CREATININE 1.53*  CALCIUM 9.6   ------------------------------------------------------------------------------------------------------------------ CrCl cannot be calculated (Unknown ideal weight.). ------------------------------------------------------------------------------------------------------------------ No results for input(s): TSH, T4TOTAL, T3FREE, THYROIDAB in the last 72 hours.  Invalid  input(s): FREET3   Cardiac Enzymes  Recent Labs Lab 07/04/15 1043  TROPONINI 0.11*   BNP is 2000  Urinalysis    Component Value Date/Time   COLORURINE YELLOW* 04/11/2015 1225   COLORURINE Amber 03/19/2014 2110   APPEARANCEUR CLEAR* 04/11/2015 1225   APPEARANCEUR Hazy 03/19/2014 2110   LABSPEC 1.005 04/11/2015 1225   LABSPEC 1.018 03/19/2014 2110   PHURINE 7.0 04/11/2015 1225   PHURINE 5.0 03/19/2014 2110   GLUCOSEU NEGATIVE 04/11/2015 1225   GLUCOSEU Negative 03/19/2014 2110   HGBUR 3+* 04/11/2015 1225   HGBUR 2+ 03/19/2014 2110   BILIRUBINUR NEGATIVE 04/11/2015 1225   BILIRUBINUR Negative 03/19/2014 2110   KETONESUR NEGATIVE 04/11/2015 1225   KETONESUR Negative 03/19/2014 2110   PROTEINUR NEGATIVE 04/11/2015 1225   PROTEINUR 30 mg/dL 09/81/1914 7829   NITRITE NEGATIVE 04/11/2015 1225   NITRITE Negative 03/19/2014 2110   LEUKOCYTESUR NEGATIVE 04/11/2015 1225   LEUKOCYTESUR Negative 03/19/2014 2110     RADIOLOGY: Dg Chest 2 View  07/04/2015   CLINICAL DATA:  Shortness of breath on exertion for 3 days  EXAM: CHEST  2 VIEW  COMPARISON:  03/06/2015  FINDINGS: Moderate enlargement of the cardiac silhouette is noted with central vascular congestion. Small pleural effusions are present. No acute osseous abnormality. No pneumothorax.  IMPRESSION: Cardiomegaly with central vascular congestion but no overt edema.  Small pleural  effusions with associated presumed compressive atelectasis. Early pneumonia could appear similar.   Electronically Signed   By: Christiana Pellant M.D.   On: 07/04/2015 10:58    EKG: NSR with 1 degree AV block.  IMPRESSION AND PLAN:  * Acute on chronic respiratory failure   With hypoxia due to acute CHF   Supplemental oxygen, treat CHF, he will need assessment of home oxygen use at discharge.  * Acute on chronic diastolic CHF   BNP is 2000, he has recent echocardiogram in April 2016 by his cardiologist- EF 55% severe LVH,     IV Lasix , fluid  restriction , daily weight , cardiology consult by [facility] .  *   hypertension   stable, continue home meds.  * Diabetes  Continue home med, insulin sliding scale coverage  * Acute renal failure   Creatinine is 1.53, he will be on IV Lasix, monitor daily.  * Hypokalemia  Replace orally as he will be on IV Lasix, monitor because of renal failure.  * Smoking  Counseled to quit smoking for 4 minutes, he will require nicotine patch in hospital.  All the records are reviewed and case discussed with ED provider. Management plans discussed with the patient, family and they are in agreement.  CODE STATUS: Full I discussed with him in presence of his wife , in any acute event he would like everything to be done , but later on if there is no chance of recovery he would not like to be alive on machines and artificial therapies for long time .    TOTAL TIME TAKING CARE OF THIS PATIENT: 50 minutes.    Altamese Dilling M.D on 07/04/2015   Between 7am to 6pm - Pager - [phone]  After 6pm go to [email or web address] - password [email or web address] [facility]  [facility]  Office  [phone]  CC: Primary care physician; Kristeen Miss, MD

## 2015-07-04 NOTE — ED Notes (Signed)
Pt with sob x 1 week, loose stools, dark stools. Symptoms worse past three days

## 2015-07-04 NOTE — ED Provider Notes (Addendum)
[facility] Emergency Department Provider Note   ____________________________________________  Time seen: 11 AM I have reviewed the triage vital signs and the triage nursing note.  HISTORY  Chief Complaint Shortness of Breath   Historian Patient and spouse  HPI Austin [name] is a 74 y.o. male is complaining of shortness of breath which is been worsening over approximately one week. The last 3 days have been worse. Symptoms are moderate to severe. He does not normally wear oxygen. No chest pain. It feels like he's having fluid in his lungs like he has had in the past. He's been having increased lower sure he swelling. History of CHF and COPD. He's been coughing or gagging on white sputum per the patient. No recent fevers. Patient does have a history of DVT multiple years ago, he was taken off of a blood thinner over a year ago as a result of bleeding stomach ulcer per the wife. No new one-sided leg swelling. No pleuritic chest pain.   Past Medical History  Diagnosis Date  . COPD (chronic obstructive pulmonary disease)   . Gastric ulcer   . DVT (deep venous thrombosis)   . PE (pulmonary embolism)   . Hypertension   . Hypercholesteremia   . Diabetes mellitus without complication   . GERD (gastroesophageal reflux disease)   . Atrial fibrillation     There are no active problems to display for this patient.   Past Surgical History  Procedure Laterality Date  . Hernia repair    . Prostate surgery      Current Outpatient Rx  Name  Route  Sig  Dispense  Refill  . budesonide-formoterol (SYMBICORT) 160-4.5 MCG/ACT inhaler   Inhalation   Inhale 2 puffs into the lungs 2 (two) times daily.         . furosemide (LASIX) 40 MG tablet   Oral   Take 40 mg by mouth daily.         Marland Kitchen HYDROcodone-acetaminophen (NORCO/VICODIN) 5-325 MG per tablet   Oral   Take 1 tablet by mouth every 4 (four) hours as needed for moderate pain.         Marland Kitchen lisinopril  (PRINIVIL,ZESTRIL) 40 MG tablet   Oral   Take 40 mg by mouth daily.         Marland Kitchen lovastatin (MEVACOR) 40 MG tablet   Oral   Take 40 mg by mouth at bedtime. Pt should take with food.         . metFORMIN (GLUCOPHAGE) 1000 MG tablet   Oral   Take 1,000 mg by mouth 2 (two) times daily with a meal.         . metoprolol tartrate (LOPRESSOR) 25 MG tablet   Oral   Take 25 mg by mouth daily.         Marland Kitchen omeprazole (PRILOSEC) 40 MG capsule   Oral   Take 40 mg by mouth daily.         . sitaGLIPtin (JANUVIA) 100 MG tablet   Oral   Take 100 mg by mouth daily.         . Tiotropium Bromide Monohydrate 2.5 MCG/ACT AERS   Inhalation   Inhale 2 puffs into the lungs daily.           Allergies Contrast media  No family history on file.  Social History History  Substance Use Topics  . Smoking status: Current Every Day Smoker  . Smokeless tobacco: Not on file  . Alcohol Use:  No    Review of Systems  Constitutional: Negative for fever. Eyes: Negative for visual changes. ENT: Negative for sore throat. Cardiovascular: Negative for chest pain. Respiratory: See history of present illness Gastrointestinal: Positive for dark colored diarrhea for about 3 days Genitourinary: Negative for dysuria. Musculoskeletal: Negative for back pain. Skin: Negative for rash. Neurological: Negative for headaches, focal weakness or numbness. 10 point Review of Systems otherwise negative ____________________________________________   PHYSICAL EXAM:  VITAL SIGNS: ED Triage Vitals  Enc Vitals Group     BP 07/04/15 1019 139/85 mmHg     Pulse Rate 07/04/15 1019 73     Resp 07/04/15 1043 18     Temp 07/04/15 1019 97.6 F (36.4 C)     Temp Source 07/04/15 1019 Oral     SpO2 07/04/15 1019 100 %     Weight --      Height 07/04/15 1019 6' (1.829 m)     Head Cir --      Peak Flow --      Pain Score --      Pain Loc --      Pain Edu? --      Excl. in GC? --      Constitutional: Alert  and oriented. Well appearing and in no distress. Eyes: Conjunctivae are normal. PERRL. Normal extraocular movements. ENT   Head: Normocephalic and atraumatic.   Nose: No congestion/rhinnorhea.   Mouth/Throat: Mucous membranes are moist. Poor dentition and multiple bad teeth   Neck: No stridor. Cardiovascular/Chest: Normal rate, regular rhythm.  No murmurs, rubs, or gallops. Respiratory: Decreased breath sounds bilateral bases. Mild rhonchi posteriorly both lung fields. No wheezing. No retractions. Normal rate of breathing. Gastrointestinal: Soft. No distention, no guarding, no rebound. Nontender   Genitourinary/rectal:Deferred Musculoskeletal: Nontender with normal range of motion in all extremities. No joint effusions. 2+ leg edema bilateral lower extremity is without tenderness.  Neurologic:  Normal speech and language. No gross or focal neurologic deficits are appreciated. Skin:  Skin is warm, dry and intact. No rash noted. Psychiatric: Mood and affect are normal. Speech and behavior are normal. Patient exhibits appropriate insight and judgment.  ____________________________________________   EKG I, Governor Rooks, MD, the attending physician have personally viewed and interpreted all ECGs.  61 bpm. Sinus rhythm with first-degree AV block. Right bundle branch block. Left axis deviation. Nonspecific T wave. ____________________________________________  LABS (pertinent positives/negatives)  White blood count 4.8, hemoglobin 13.3 Potassium 3.0, creatinine 1.5 Troponin 0.11 BNP 2037  ____________________________________________  RADIOLOGY All Xrays were viewed by me. Imaging interpreted by Radiologist.   Chest x-ray portable: IMPRESSION: Cardiomegaly with central vascular congestion but no overt edema.  Small pleural effusions with associated presumed compressive atelectasis. Early pneumonia could appear  similar.  __________________________________________  PROCEDURES  Procedure(s) performed: None Critical Care performed: None  ____________________________________________   ED COURSE / ASSESSMENT AND PLAN  CONSULTATIONS: Face-to-face with Hospitalist for admission  Pertinent labs & imaging results that were available during my care of the patient were reviewed by me and considered in my medical decision making (see chart for details).  Although was initially difficult to confirm the patient's O2 sat, an ear probe did reveal a good waveform and O2 sat of 85% on room air. Patient was placed on 2 L nasal cannula and up to 93% on 2 L. His symptoms and clinical scenario seem most consistent with congestive heart failure. He is not wheezing I don't think that COPD is the main problem with his shortness with  hypoxia. Pneumonia seems unlikely with no fever, no change in colored sputum, and no elevated white blood cell count.  Patient has had a history of a DVT in the past, I think the patient has an obvious other source of his shortness of breath and hypoxia which is his congestive heart failure exacerbation. It may be something to consider the possibility PE if he does not improve as expected with treatment for congestive heart failure.  BNP came back significantly elevated consistent with clinical impression. Patient's troponin is minimally elevated at 0.11. I suspect this is a cardiac strain rather than acute occlusion. Patient was given 324 of aspirin. For the congestive heart failure and pulmonary edema, patient was given 60 mg of IV Lasix.  I talked with Dr. Desiree Hane, about the presentation and findings.  Patient / Family / Caregiver informed of clinical course, medical decision-making process, and agree with plan.   I discussed return precautions, follow-up instructions, and discharged instructions with patient and/or family.  ___________________________________________   FINAL  CLINICAL IMPRESSION(S) / ED DIAGNOSES   Final diagnoses:  Hypoxia  Acute on chronic congestive heart failure, unspecified congestive heart failure type  Elevated troponin      Governor Rooks, MD 07/04/15 1157  Governor Rooks, MD 07/04/15 1208

## 2015-07-04 NOTE — Progress Notes (Signed)
Pt with fsbs 34f 50, ate dinner, fsbs 58,  Cola given, fsbs 71- hypoglycemia protocol followed, whole milk and peanut butter crackers given to pt at this time. pt has denied s/s, skin is warm and dry, pt mentation WNL.  Family at the bedside.

## 2015-07-04 NOTE — ED Notes (Signed)
Patient placed on 2L O2 via [place] for low O2 sats.

## 2015-07-04 NOTE — ED Notes (Signed)
Call to RN Sonja to let her know pt coming back. ED tech Katie to complete EKG.

## 2015-07-05 ENCOUNTER — Inpatient Hospital Stay: Payer: BLUE CROSS/BLUE SHIELD

## 2015-07-05 LAB — CBC
HCT: 38.4 % — ABNORMAL LOW (ref 40.0–52.0)
Hemoglobin: 12.7 g/dL — ABNORMAL LOW (ref 13.0–18.0)
MCH: 31.7 pg (ref 26.0–34.0)
MCHC: 33.1 g/dL (ref 32.0–36.0)
MCV: 95.8 fL (ref 80.0–100.0)
Platelets: 117 10*3/uL — ABNORMAL LOW (ref 150–440)
RBC: 4.01 MIL/uL — AB (ref 4.40–5.90)
RDW: 16 % — AB (ref 11.5–14.5)
WBC: 5.2 10*3/uL (ref 3.8–10.6)

## 2015-07-05 LAB — BASIC METABOLIC PANEL
Anion gap: 10 (ref 5–15)
BUN: 20 mg/dL (ref 6–20)
CO2: 31 mmol/L (ref 22–32)
CREATININE: 1.37 mg/dL — AB (ref 0.61–1.24)
Calcium: 9.3 mg/dL (ref 8.9–10.3)
Chloride: 99 mmol/L — ABNORMAL LOW (ref 101–111)
GFR calc Af Amer: 57 mL/min — ABNORMAL LOW (ref >60)
GFR, EST NON AFRICAN AMERICAN: 50 mL/min — AB (ref >60)
GLUCOSE: 75 mg/dL (ref 65–99)
Potassium: 3.2 mmol/L — ABNORMAL LOW (ref 3.5–5.1)
Sodium: 140 mmol/L (ref 135–145)

## 2015-07-05 LAB — GLUCOSE, CAPILLARY
GLUCOSE-CAPILLARY: 102 mg/dL — AB (ref 65–99)
GLUCOSE-CAPILLARY: 119 mg/dL — AB (ref 65–99)
Glucose-Capillary: 89 mg/dL (ref 65–99)
Glucose-Capillary: 176 mg/dL — ABNORMAL HIGH (ref 65–99)

## 2015-07-05 LAB — HEMOGLOBIN A1C: Hgb A1c MFr Bld: 6.8 % — ABNORMAL HIGH (ref 4.0–6.0)

## 2015-07-05 MED ORDER — METHYLPREDNISOLONE SODIUM SUCC 125 MG IJ SOLR
60.0000 mg | Freq: Four times a day (QID) | INTRAMUSCULAR | Status: DC
  Administered 2015-07-05 – 2015-07-06 (×4): 60 mg via INTRAVENOUS
  Filled 2015-07-05 (×4): qty 2

## 2015-07-05 MED ORDER — FUROSEMIDE 40 MG PO TABS
60.0000 mg | ORAL_TABLET | Freq: Every day | ORAL | Status: DC
  Administered 2015-07-06 – 2015-07-08 (×3): 60 mg via ORAL
  Filled 2015-07-05 (×3): qty 1

## 2015-07-05 MED ORDER — TIOTROPIUM BROMIDE MONOHYDRATE 18 MCG IN CAPS
18.0000 ug | ORAL_CAPSULE | Freq: Every day | RESPIRATORY_TRACT | Status: DC
  Administered 2015-07-05 – 2015-07-08 (×4): 18 ug via RESPIRATORY_TRACT
  Filled 2015-07-05: qty 5

## 2015-07-05 MED ORDER — POTASSIUM CHLORIDE CRYS ER 20 MEQ PO TBCR
40.0000 meq | EXTENDED_RELEASE_TABLET | Freq: Once | ORAL | Status: AC
  Administered 2015-07-05: 40 meq via ORAL
  Filled 2015-07-05: qty 2

## 2015-07-05 NOTE — Consult Note (Signed)
Reason for Consult: shortness of breath Referring Physician:  hospitalist Dr Reather Laurence is an 74 y.o. male.  HPI:  74 year old male well known to me history of COPD DVT pulmonary embolus atrial fibrillation not anticoagulation candidate because of GI bleeding. Hypertension hyperlipidemia Diabetes diastolic heart failure will complains of weight loss weakness fatigue and shortness of breath. Patient states he has had recurrent symptoms at home with recurrent shortness of breath episodes and some chest discomfort he has been increasing his dose of Lasix but is shortness of breath has persisted he still smokes complains of congestion and cough sick came to emergency room for evaluation  Past Medical History  Diagnosis Date  . COPD (chronic obstructive pulmonary disease)   . Gastric ulcer   . DVT (deep venous thrombosis)   . PE (pulmonary embolism)   . Hypertension   . Hypercholesteremia   . Diabetes mellitus without complication   . GERD (gastroesophageal reflux disease)   . Atrial fibrillation     Past Surgical History  Procedure Laterality Date  . Hernia repair    . Prostate surgery      Family History  Problem Relation Age of Onset  . CAD Brother   . Congestive Heart Failure Brother     Social History:  reports that he has been smoking.  He does not have any smokeless tobacco history on file. He reports that he does not drink alcohol or use illicit drugs.  Allergies:  Allergies  Allergen Reactions  . Contrast Media [Iodinated Diagnostic Agents] Nausea And Vomiting    Medications: I have reviewed the patient's current medications.  Results for orders placed or performed during the hospital encounter of 07/04/15 (from the past 48 hour(s))  Basic metabolic panel     Status: Abnormal   Collection Time: 07/04/15 10:43 AM  Result Value Ref Range   Sodium 140 135 - 145 mmol/L   Potassium 3.0 (L) 3.5 - 5.1 mmol/L   Chloride 101 101 - 111 mmol/L   CO2 28 22 -  32 mmol/L   Glucose, Bld 100 (H) 65 - 99 mg/dL   BUN 18 6 - 20 mg/dL   Creatinine, Ser 1.53 (H) 0.61 - 1.24 mg/dL   Calcium 9.6 8.9 - 10.3 mg/dL   GFR calc non Af Amer 43 (L) >60 mL/min   GFR calc Af Amer 50 (L) >60 mL/min    Comment: (NOTE) The eGFR has been calculated using the CKD EPI equation. This calculation has not been validated in all clinical situations. eGFR's persistently <60 mL/min signify possible Chronic Kidney Disease.    Anion gap 11 5 - 15  Troponin I     Status: Abnormal   Collection Time: 07/04/15 10:43 AM  Result Value Ref Range   Troponin I 0.11 (H) <0.031 ng/mL    Comment: READ BACK AND VERIFIED WITH SONJA WEAVER @ 1610 ON 07/04/2015 [facility]        PERSISTENTLY INCREASED TROPONIN VALUES IN THE RANGE OF 0.04-0.49 ng/mL CAN BE SEEN IN:       -UNSTABLE ANGINA       -CONGESTIVE HEART FAILURE       -MYOCARDITIS       -CHEST TRAUMA       -ARRYHTHMIAS       -LATE PRESENTING MYOCARDIAL INFARCTION       -COPD   CLINICAL FOLLOW-UP RECOMMENDED.   Brain natriuretic peptide     Status: Abnormal   Collection Time: 07/04/15 10:43 AM  Result  Value Ref Range   B Natriuretic Peptide 2037.0 (H) 0.0 - 100.0 pg/mL  CBC with Differential     Status: Abnormal   Collection Time: 07/04/15 10:43 AM  Result Value Ref Range   WBC 4.8 3.8 - 10.6 K/uL   RBC 4.36 (L) 4.40 - 5.90 MIL/uL   Hemoglobin 13.3 13.0 - 18.0 g/dL   HCT 41.7 40.0 - 52.0 %   MCV 95.8 80.0 - 100.0 fL   MCH 30.6 26.0 - 34.0 pg   MCHC 32.0 32.0 - 36.0 g/dL   RDW 16.0 (H) 11.5 - 14.5 %   Platelets 131 (L) 150 - 440 K/uL   Neutrophils Relative % 77 %   Neutro Abs 3.7 1.4 - 6.5 K/uL   Lymphocytes Relative 12 %   Lymphs Abs 0.6 (L) 1.0 - 3.6 K/uL   Monocytes Relative 9 %   Monocytes Absolute 0.4 0.2 - 1.0 K/uL   Eosinophils Relative 1 %   Eosinophils Absolute 0.0 0 - 0.7 K/uL   Basophils Relative 1 %   Basophils Absolute 0.1 0 - 0.1 K/uL  Troponin I     Status: Abnormal   Collection Time: 07/04/15  1:27  PM  Result Value Ref Range   Troponin I 0.10 (H) <0.031 ng/mL    Comment: RESULTS PREVIOUSLY CALLED ON 07/04/15 AT 1149        PERSISTENTLY INCREASED TROPONIN VALUES IN THE RANGE OF 0.04-0.49 ng/mL CAN BE SEEN IN:       -UNSTABLE ANGINA       -CONGESTIVE HEART FAILURE       -MYOCARDITIS       -CHEST TRAUMA       -ARRYHTHMIAS       -LATE PRESENTING MYOCARDIAL INFARCTION       -COPD   CLINICAL FOLLOW-UP RECOMMENDED.   Troponin I     Status: Abnormal   Collection Time: 07/04/15  4:36 PM  Result Value Ref Range   Troponin I 0.11 (H) <0.031 ng/mL    Comment: RESULTS PREVIOUSLY CALLED ON 07/04/15 AT 1149        PERSISTENTLY INCREASED TROPONIN VALUES IN THE RANGE OF 0.04-0.49 ng/mL CAN BE SEEN IN:       -UNSTABLE ANGINA       -CONGESTIVE HEART FAILURE       -MYOCARDITIS       -CHEST TRAUMA       -ARRYHTHMIAS       -LATE PRESENTING MYOCARDIAL INFARCTION       -COPD   CLINICAL FOLLOW-UP RECOMMENDED.   Hemoglobin A1c     Status: Abnormal   Collection Time: 07/04/15  4:36 PM  Result Value Ref Range   Hgb A1c MFr Bld 6.8 (H) 4.0 - 6.0 %  Glucose, capillary     Status: Abnormal   Collection Time: 07/04/15  4:38 PM  Result Value Ref Range   Glucose-Capillary 50 (L) 65 - 99 mg/dL   Comment 1 Notify RN   Glucose, capillary     Status: Abnormal   Collection Time: 07/04/15  5:46 PM  Result Value Ref Range   Glucose-Capillary 58 (L) 65 - 99 mg/dL  Glucose, capillary     Status: None   Collection Time: 07/04/15  6:15 PM  Result Value Ref Range   Glucose-Capillary 71 65 - 99 mg/dL  Glucose, capillary     Status: None   Collection Time: 07/04/15  8:51 PM  Result Value Ref Range   Glucose-Capillary 93 65 - 99 mg/dL  Troponin I     Status: Abnormal   Collection Time: 07/04/15  9:03 PM  Result Value Ref Range   Troponin I 0.09 (H) <0.031 ng/mL    Comment: RESULTS PREVIOUSLY CALLED 07/04/15 @1149 .Marland KitchenMarland KitchenAJO        PERSISTENTLY INCREASED TROPONIN VALUES IN THE RANGE OF 0.04-0.49 ng/mL  CAN BE SEEN IN:       -UNSTABLE ANGINA       -CONGESTIVE HEART FAILURE       -MYOCARDITIS       -CHEST TRAUMA       -ARRYHTHMIAS       -LATE PRESENTING MYOCARDIAL INFARCTION       -COPD   CLINICAL FOLLOW-UP RECOMMENDED.   Basic metabolic panel     Status: Abnormal   Collection Time: 07/05/15  4:24 AM  Result Value Ref Range   Sodium 140 135 - 145 mmol/L   Potassium 3.2 (L) 3.5 - 5.1 mmol/L   Chloride 99 (L) 101 - 111 mmol/L   CO2 31 22 - 32 mmol/L   Glucose, Bld 75 65 - 99 mg/dL   BUN 20 6 - 20 mg/dL   Creatinine, Ser 1.37 (H) 0.61 - 1.24 mg/dL   Calcium 9.3 8.9 - 10.3 mg/dL   GFR calc non Af Amer 50 (L) >60 mL/min   GFR calc Af Amer 57 (L) >60 mL/min    Comment: (NOTE) The eGFR has been calculated using the CKD EPI equation. This calculation has not been validated in all clinical situations. eGFR's persistently <60 mL/min signify possible Chronic Kidney Disease.    Anion gap 10 5 - 15  CBC     Status: Abnormal   Collection Time: 07/05/15  4:24 AM  Result Value Ref Range   WBC 5.2 3.8 - 10.6 K/uL   RBC 4.01 (L) 4.40 - 5.90 MIL/uL   Hemoglobin 12.7 (L) 13.0 - 18.0 g/dL   HCT 38.4 (L) 40.0 - 52.0 %   MCV 95.8 80.0 - 100.0 fL   MCH 31.7 26.0 - 34.0 pg   MCHC 33.1 32.0 - 36.0 g/dL   RDW 16.0 (H) 11.5 - 14.5 %   Platelets 117 (L) 150 - 440 K/uL  Glucose, capillary     Status: None   Collection Time: 07/05/15  8:37 AM  Result Value Ref Range   Glucose-Capillary 89 65 - 99 mg/dL  Glucose, capillary     Status: Abnormal   Collection Time: 07/05/15 11:55 AM  Result Value Ref Range   Glucose-Capillary 102 (H) 65 - 99 mg/dL  Glucose, capillary     Status: Abnormal   Collection Time: 07/05/15  4:03 PM  Result Value Ref Range   Glucose-Capillary 119 (H) 65 - 99 mg/dL    Dg Chest 2 View  07/05/2015   CLINICAL DATA:  Increasing shortness of Breath, history of COPD  EXAM: CHEST - 2 VIEW  COMPARISON:  07/04/2015  FINDINGS: Cardiac shadow remains enlarged. Bilateral small  effusions are again noted and stable. Mild vascular congestion is again identified and stable. No new focal abnormality is seen.  IMPRESSION: No change from the previous day.   Electronically Signed   By: Inez Catalina M.D.   On: 07/05/2015 07:54   Dg Chest 2 View  07/04/2015   CLINICAL DATA:  Shortness of breath on exertion for 3 days  EXAM: CHEST  2 VIEW  COMPARISON:  03/06/2015  FINDINGS: Moderate enlargement of the cardiac silhouette is noted with central vascular congestion. Small pleural effusions are  present. No acute osseous abnormality. No pneumothorax.  IMPRESSION: Cardiomegaly with central vascular congestion but no overt edema.  Small pleural effusions with associated presumed compressive atelectasis. Early pneumonia could appear similar.   Electronically Signed   By: Conchita Paris M.D.   On: 07/04/2015 10:58    Review of Systems  Constitutional: Positive for malaise/fatigue.  HENT: Negative.   Eyes: Negative.   Respiratory: Positive for cough, shortness of breath and wheezing.   Cardiovascular: Positive for chest pain and orthopnea.  Gastrointestinal: Negative.   Genitourinary: Negative.   Musculoskeletal: Negative.   Skin: Negative.   Neurological: Positive for weakness.  Endo/Heme/Allergies: Negative.   Psychiatric/Behavioral: Negative.    Blood pressure 136/86, pulse 67, temperature 97.8 F (36.6 C), temperature source Axillary, resp. rate 18, height 6' (1.829 m), weight 94.303 kg (207 lb 14.4 oz), SpO2 98 %. Physical Exam  Constitutional: He appears well-developed.  HENT:  Head: Normocephalic and atraumatic.  Right Ear: External ear normal.  Eyes: EOM are normal. Pupils are equal, round, and reactive to light.  Neck: Normal range of motion. Neck supple.  Cardiovascular: Normal rate, regular rhythm and normal heart sounds.   Respiratory: Bradypnea noted. He has decreased breath sounds. He has rhonchi.  GI: Soft. Bowel sounds are normal.  Musculoskeletal: Normal range  of motion.  Neurological: He is alert.  Skin: Skin is warm and dry.  Psychiatric: He has a normal mood and affect.    Assessment/Plan:  shortness of breath  COPD  Exacerbation  congestive heart failure with diastolic  Dysfunction  hypertension  smoking  diabetes  chronic renal insufficiency  hypokalemia  bradycardia with sick sinus syndrome  paroxysmal atrial fibrillation  GERD  weight loss . PLAN  agree with admit to telemetry rule out myocardial infarction  continue supplemental oxygen  recommend continued inhalers for shortness of breath  advised patient to quit smoking  continue diuretic therapy for shortness of breath  agree with lisinopril and Lasix and for hypertension symptoms  continue Protonix for reflux  aggressive pulmonary therapy for COPD  follow-up with Nephrology for chronic renal insufficiency  short-term anticoagulation while in the hospital  this appears to mostly be pulmonary rather than cardiac etiology of shortness of breath  CALLWOOD,DWAYNE D. 07/05/2015, 6:29 PM

## 2015-07-05 NOTE — Care Management (Addendum)
Patient presents with what appears to be new onset chf.  It is documented in h/p that patient has history of pulmonary embolus but not chronic treatment is documented.  Requiring supplemental 02 which is acute.  He presents from home where he lives with his wife.  He is independent in all adls.  Denies issues accessing medical care and obtaining medications.  He may benefit from referral to heart failure clinic

## 2015-07-05 NOTE — Progress Notes (Signed)
Patient ID: Austin [name], male   DOB: Apr 05, 1941, 74 y.o.   MRN: [id number] [facility] PROGRESS NOTE  PCP: Kristeen Miss, MD  HPI/Subjective: Patient feeling a little bit better. Still short of breath, coughing up clear phlegm. At baseline has poor exercise capacity with shortness of breath with a short walk.   Objective: Filed Vitals:   07/05/15 1156  BP: 136/86  Pulse: 67  Temp: 97.8 F (36.6 C)  Resp: 18    Filed Weights   07/04/15 1248 07/04/15 1432 07/05/15 0502  Weight: 97.297 kg (214 lb 8 oz) 93.713 kg (206 lb 9.6 oz) 94.303 kg (207 lb 14.4 oz)    ROS: Review of Systems  Constitutional: Negative for fever and chills.  Eyes: Negative for blurred vision.  Respiratory: Positive for cough, sputum production and shortness of breath.   Cardiovascular: Negative for chest pain.  Gastrointestinal: Negative for nausea, vomiting, abdominal pain, diarrhea and constipation.  Genitourinary: Negative for dysuria.  Musculoskeletal: Negative for joint pain.  Neurological: Negative for dizziness and headaches.   Exam: Physical Exam  Constitutional: He is oriented to person, place, and time.  HENT:  Nose: No mucosal edema.  Mouth/Throat: No oropharyngeal exudate or posterior oropharyngeal edema.  Eyes: Conjunctivae, EOM and lids are normal. Pupils are equal, round, and reactive to light.  Neck: No JVD present. Carotid bruit is not present. No edema present. No thyroid mass and no thyromegaly present.  Cardiovascular: S1 normal and S2 normal.  Exam reveals no gallop.   No murmur heard. Pulses:      Dorsalis pedis pulses are 2+ on the right side, and 2+ on the left side.  Respiratory: No accessory muscle usage. No respiratory distress. He has decreased breath sounds in the right middle field, the right lower field, the left middle field and the left lower field. He has wheezes in the right upper field and the left upper field. He has no rhonchi. He has  rales in the right lower field and the left lower field.  GI: Soft. Bowel sounds are normal. There is no tenderness.  Musculoskeletal:       Right ankle: He exhibits swelling.       Left ankle: He exhibits swelling.  Lymphadenopathy:    He has no cervical adenopathy.  Neurological: He is alert and oriented to person, place, and time. No cranial nerve deficit.  Skin: Skin is warm. Nails show no clubbing.  Chronic lower extremity discoloration. Some small scabs with ulcerations.  Psychiatric: He has a normal mood and affect.    Data Reviewed: Basic Metabolic Panel:  Recent Labs Lab 07/04/15 1043 07/05/15 0424  NA 140 140  K 3.0* 3.2*  CL 101 99*  CO2 28 31  GLUCOSE 100* 75  BUN 18 20  CREATININE 1.53* 1.37*  CALCIUM 9.6 9.3   CBC:  Recent Labs Lab 07/04/15 1043 07/05/15 0424  WBC 4.8 5.2  NEUTROABS 3.7  --   HGB 13.3 12.7*  HCT 41.7 38.4*  MCV 95.8 95.8  PLT 131* 117*   Cardiac Enzymes:  Recent Labs Lab 07/04/15 1043 07/04/15 1327 07/04/15 1636 07/04/15 2103  TROPONINI 0.11* 0.10* 0.11* 0.09*    Studies: Dg Chest 2 View  07/05/2015   CLINICAL DATA:  Increasing shortness of Breath, history of COPD  EXAM: CHEST - 2 VIEW  COMPARISON:  07/04/2015  FINDINGS: Cardiac shadow remains enlarged. Bilateral small effusions are again noted and stable. Mild vascular congestion is again identified and stable. No  new focal abnormality is seen.  IMPRESSION: No change from the previous day.   Electronically Signed   By: Alcide Clever M.D.   On: 07/05/2015 07:54   Dg Chest 2 View  07/04/2015   CLINICAL DATA:  Shortness of breath on exertion for 3 days  EXAM: CHEST  2 VIEW  COMPARISON:  03/06/2015  FINDINGS: Moderate enlargement of the cardiac silhouette is noted with central vascular congestion. Small pleural effusions are present. No acute osseous abnormality. No pneumothorax.  IMPRESSION: Cardiomegaly with central vascular congestion but no overt edema.  Small pleural effusions  with associated presumed compressive atelectasis. Early pneumonia could appear similar.   Electronically Signed   By: Christiana Pellant M.D.   On: 07/04/2015 10:58    Scheduled Meds: . budesonide-formoterol  2 puff Inhalation BID  . furosemide  20 mg Intravenous Q8H  . heparin  5,000 Units Subcutaneous 3 times per day  . insulin aspart  0-9 Units Subcutaneous TID WC  . ipratropium-albuterol  3 mL Inhalation QID  . linagliptin  5 mg Oral Daily  . lisinopril  40 mg Oral Daily  . methylPREDNISolone (SOLU-MEDROL) injection  60 mg Intravenous Q6H  . nicotine  21 mg Transdermal Daily  . pantoprazole  40 mg Oral Daily  . potassium chloride  20 mEq Oral BID  . pravastatin  40 mg Oral q1800  . tiotropium  18 mcg Inhalation Daily    Assessment/Plan:  1. Acute respiratory failure with hypoxia- patient was on 3 L of oxygen. I will check a pulse ox on room air in a.m. 2. COPD exacerbation- start IV Solu-Medrol 60 mg every 6 hours continue nebulizer treatments. Continue Spiriva and Symbicort. Add Zithromax. 3. Acute diastolic congestive heart failure- switch Lasix back to oral dose. Hold on beta blocker with COPD exacerbation. 4. Hyperlipidemia unspecified continue pravastatin 5. Initial esophageal reflux disease without esophagitis continue Protonix 6. Diabetes mellitus type 2- sugars will be high with Solu-Medrol continue sliding scale at this point. 7. Weakness and decreased exercise capacity -physical therapy evaluation.  Code Status:     Code Status Orders        Start     Ordered   07/04/15 1422  Full code   Continuous     07/04/15 1422     Family Communication: Family at bedside Disposition Plan: Home once breathing better  Time spent: 35 minutes  Alford Highland  [facility]

## 2015-07-06 LAB — BASIC METABOLIC PANEL
ANION GAP: 10 (ref 5–15)
BUN: 24 mg/dL — ABNORMAL HIGH (ref 6–20)
CALCIUM: 9.6 mg/dL (ref 8.9–10.3)
CO2: 28 mmol/L (ref 22–32)
Chloride: 98 mmol/L — ABNORMAL LOW (ref 101–111)
Creatinine, Ser: 1.43 mg/dL — ABNORMAL HIGH (ref 0.61–1.24)
GFR calc non Af Amer: 47 mL/min — ABNORMAL LOW (ref >60)
GFR, EST AFRICAN AMERICAN: 55 mL/min — AB (ref >60)
Glucose, Bld: 240 mg/dL — ABNORMAL HIGH (ref 65–99)
Potassium: 4.4 mmol/L (ref 3.5–5.1)
Sodium: 136 mmol/L (ref 135–145)

## 2015-07-06 LAB — GLUCOSE, CAPILLARY
Glucose-Capillary: 248 mg/dL — ABNORMAL HIGH (ref 65–99)
Glucose-Capillary: 223 mg/dL — ABNORMAL HIGH (ref 65–99)
Glucose-Capillary: 195 mg/dL — ABNORMAL HIGH (ref 65–99)
Glucose-Capillary: 233 mg/dL — ABNORMAL HIGH (ref 65–99)

## 2015-07-06 LAB — MAGNESIUM: Magnesium: 1.3 mg/dL — ABNORMAL LOW (ref 1.7–2.4)

## 2015-07-06 MED ORDER — POLYETHYLENE GLYCOL 3350 17 G PO PACK
17.0000 g | PACK | Freq: Every day | ORAL | Status: DC
  Administered 2015-07-06 – 2015-07-08 (×3): 17 g via ORAL
  Filled 2015-07-06 (×3): qty 1

## 2015-07-06 MED ORDER — AZITHROMYCIN 250 MG PO TABS
500.0000 mg | ORAL_TABLET | Freq: Every day | ORAL | Status: AC
  Administered 2015-07-06: 500 mg via ORAL
  Filled 2015-07-06: qty 2

## 2015-07-06 MED ORDER — METHYLPREDNISOLONE SODIUM SUCC 40 MG IJ SOLR
40.0000 mg | Freq: Four times a day (QID) | INTRAMUSCULAR | Status: DC
  Administered 2015-07-06 – 2015-07-07 (×4): 40 mg via INTRAVENOUS
  Filled 2015-07-06 (×4): qty 1

## 2015-07-06 MED ORDER — AZITHROMYCIN 250 MG PO TABS
250.0000 mg | ORAL_TABLET | Freq: Every day | ORAL | Status: DC
  Administered 2015-07-07 – 2015-07-08 (×2): 250 mg via ORAL
  Filled 2015-07-06 (×2): qty 1

## 2015-07-06 MED ORDER — MAGNESIUM SULFATE 2 GM/50ML IV SOLN
2.0000 g | Freq: Once | INTRAVENOUS | Status: AC
  Administered 2015-07-06: 2 g via INTRAVENOUS
  Filled 2015-07-06: qty 50

## 2015-07-06 NOTE — Progress Notes (Signed)
Initial Nutrition Assessment     INTERVENTION:   Meals and Snacks: Cater to patient preferences  NUTRITION DIAGNOSIS:   No nutrition diagnosis at this time  GOAL:   Patient will meet greater than or equal to 90% of their needs   MONITOR:    (Energy Intake, Anthropometrics, Electrolyte/Renal Profile, Digestive System, Glucose Profile)  REASON FOR ASSESSMENT:   Diagnosis    ASSESSMENT:    Pt admitted with acute respiratory failure with COPD exacerbation, CHF  Past Medical History  Diagnosis Date  . COPD (chronic obstructive pulmonary disease)   . Gastric ulcer   . DVT (deep venous thrombosis)   . PE (pulmonary embolism)   . Hypertension   . Hypercholesteremia   . Diabetes mellitus without complication   . GERD (gastroesophageal reflux disease)   . Atrial fibrillation      Diet Order:  Diet heart healthy/carb modified Room service appropriate?: Yes; Fluid consistency:: Thin   Energy Intake: recorded po intake 80-100% of meals, appetite good  Skin:  Reviewed, no issues  Last BM:  7/25   Electrolyte and Renal Profile:  Recent Labs Lab 07/04/15 1043 07/05/15 0424 07/06/15 0530  BUN 18 20 24*  CREATININE 1.53* 1.37* 1.43*  NA 140 140 136  K 3.0* 3.2* 4.4  MG  --   --  1.3*   Glucose Profile:  Recent Labs  07/05/15 2035 07/06/15 0719 07/06/15 1057  GLUCAP 176* 248* 223*   Protein Profile: No results for input(s): ALBUMIN in the last 168 hours.   Meds: ss novolog, solumedrol, lasix, solumedrol, KCl   Height:   Ht Readings from Last 1 Encounters:  07/04/15 6' (1.829 m)    Weight:   Wt Readings from Last 1 Encounters:  07/06/15 209 lb 12.8 oz (95.165 kg)    Noted weight trend from previous weight encounters  Wt Readings from Last 10 Encounters:  07/06/15 209 lb 12.8 oz (95.165 kg)  04/11/15 170 lb (77.111 kg)    BMI:  Body mass index is 28.45 kg/(m^2).  LOW Care Level  Romelle Starcher MS, [place], LDN [phone] Pager

## 2015-07-06 NOTE — Progress Notes (Signed)
Patient ID: Austin [name], male   DOB: 02-27-1941, 74 y.o.   MRN: [id number] [facility] PROGRESS NOTE  PCP: Kristeen Miss, MD  HPI/Subjective: Patient had a wheezing episode prior to me coming into the room. He still feels short of breath. Some cough.   Objective: Filed Vitals:   07/06/15 0815  BP: 133/86  Pulse: 90  Temp: 97.5 F (36.4 C)  Resp: 18    Filed Weights   07/04/15 1432 07/05/15 0502 07/06/15 0454  Weight: 93.713 kg (206 lb 9.6 oz) 94.303 kg (207 lb 14.4 oz) 95.165 kg (209 lb 12.8 oz)    ROS: Review of Systems  Constitutional: Negative for fever and chills.  Eyes: Negative for blurred vision.  Respiratory: Positive for cough, sputum production and shortness of breath.   Cardiovascular: Negative for chest pain.  Gastrointestinal: Negative for nausea, vomiting, abdominal pain, diarrhea and constipation.  Genitourinary: Negative for dysuria.  Musculoskeletal: Negative for joint pain.  Neurological: Negative for dizziness and headaches.   Exam: Physical Exam  Constitutional: He is oriented to person, place, and time.  HENT:  Nose: No mucosal edema.  Mouth/Throat: No oropharyngeal exudate or posterior oropharyngeal edema.  Eyes: Conjunctivae, EOM and lids are normal. Pupils are equal, round, and reactive to light.  Neck: No JVD present. Carotid bruit is not present. No edema present. No thyroid mass and no thyromegaly present.  Cardiovascular: S1 normal and S2 normal.  Exam reveals no gallop.   No murmur heard. Pulses:      Dorsalis pedis pulses are 2+ on the right side, and 2+ on the left side.  Respiratory: No accessory muscle usage. No respiratory distress. He has decreased breath sounds in the right middle field, the right lower field, the left middle field and the left lower field. He has wheezes in the right upper field, the right middle field, the right lower field, the left upper field, the left middle field and the left lower  field. He has no rhonchi. He has no rales.  GI: Soft. Bowel sounds are normal. There is no tenderness.  Musculoskeletal:       Right ankle: He exhibits swelling.       Left ankle: He exhibits swelling.  Lymphadenopathy:    He has no cervical adenopathy.  Neurological: He is alert and oriented to person, place, and time. No cranial nerve deficit.  Skin: Skin is warm. Nails show no clubbing.  Chronic lower extremity discoloration. Some small scabs with ulcerations.  Psychiatric: He has a normal mood and affect.    Data Reviewed: Basic Metabolic Panel:  Recent Labs Lab 07/04/15 1043 07/05/15 0424 07/06/15 0530  NA 140 140 136  K 3.0* 3.2* 4.4  CL 101 99* 98*  CO2 28 31 28   GLUCOSE 100* 75 240*  BUN 18 20 24*  CREATININE 1.53* 1.37* 1.43*  CALCIUM 9.6 9.3 9.6  MG  --   --  1.3*   CBC:  Recent Labs Lab 07/04/15 1043 07/05/15 0424  WBC 4.8 5.2  NEUTROABS 3.7  --   HGB 13.3 12.7*  HCT 41.7 38.4*  MCV 95.8 95.8  PLT 131* 117*   Cardiac Enzymes:  Recent Labs Lab 07/04/15 1043 07/04/15 1327 07/04/15 1636 07/04/15 2103  TROPONINI 0.11* 0.10* 0.11* 0.09*    Studies: Dg Chest 2 View  07/05/2015   CLINICAL DATA:  Increasing shortness of Breath, history of COPD  EXAM: CHEST - 2 VIEW  COMPARISON:  07/04/2015  FINDINGS: Cardiac shadow remains  enlarged. Bilateral small effusions are again noted and stable. Mild vascular congestion is again identified and stable. No new focal abnormality is seen.  IMPRESSION: No change from the previous day.   Electronically Signed   By: Alcide Clever M.D.   On: 07/05/2015 07:54   Dg Chest 2 View  07/04/2015   CLINICAL DATA:  Shortness of breath on exertion for 3 days  EXAM: CHEST  2 VIEW  COMPARISON:  03/06/2015  FINDINGS: Moderate enlargement of the cardiac silhouette is noted with central vascular congestion. Small pleural effusions are present. No acute osseous abnormality. No pneumothorax.  IMPRESSION: Cardiomegaly with central vascular  congestion but no overt edema.  Small pleural effusions with associated presumed compressive atelectasis. Early pneumonia could appear similar.   Electronically Signed   By: Christiana Pellant M.D.   On: 07/04/2015 10:58    Scheduled Meds: . budesonide-formoterol  2 puff Inhalation BID  . furosemide  60 mg Oral Daily  . heparin  5,000 Units Subcutaneous 3 times per day  . insulin aspart  0-9 Units Subcutaneous TID WC  . ipratropium-albuterol  3 mL Inhalation QID  . linagliptin  5 mg Oral Daily  . lisinopril  40 mg Oral Daily  . magnesium sulfate 1 - 4 g bolus IVPB  2 g Intravenous Once  . methylPREDNISolone (SOLU-MEDROL) injection  40 mg Intravenous Q6H  . nicotine  21 mg Transdermal Daily  . pantoprazole  40 mg Oral Daily  . polyethylene glycol  17 g Oral Daily  . potassium chloride  20 mEq Oral BID  . pravastatin  40 mg Oral q1800  . tiotropium  18 mcg Inhalation Daily    Assessment/Plan:  1. Acute respiratory failure with hypoxia- room air pulse ox was good this morning the patient on oxygen right now more for comfort. Hopefully we'll be able to send home without oxygen. 2. COPD exacerbation- try to decrease Solu-Medrol to 40 mg every 6 hours continue nebulizer treatments. Continue Spiriva and Symbicort. Zithromax. 3. Acute diastolic congestive heart failure- switch Lasix back to oral dose. Hold on beta blocker with COPD exacerbation. 4. Hyperlipidemia unspecified continue pravastatin 5. Initial esophageal reflux disease without esophagitis continue Protonix 6. Diabetes mellitus type 2- sugars will be high with Solu-Medrol continue sliding scale at this point. 7. Weakness and decreased exercise capacity -physical therapy evaluation.  Code Status:     Code Status Orders        Start     Ordered   07/04/15 1422  Full code   Continuous     07/04/15 1422     Family Communication: Family at bedside Disposition Plan: Home once breathing better  Time spent: 25  minutes  Alford Highland  [facility]

## 2015-07-06 NOTE — Care Management (Signed)
During progression stressed the need to assess for need of home 02.  Do not see that the process has been carried out but there is a sat of  73% on 2 liters of 02    Unable to use this value as there are no room air / rest exertion documented.  Physical therapy has recommended that patient may benefit from skilled nursing and will consider. Patient has also agreed to home health

## 2015-07-06 NOTE — Care Management (Signed)
Attending has indicated that there is also a COPD component present.  Patient is receiving IV steroids.  Discussed during progression of need to assess for need of home 02.  Attending anticipates discharge within the next 24 hours

## 2015-07-06 NOTE — Progress Notes (Signed)
Patient said he is feeling better,continues on 2 liters of oxygen,reduced swelling in lower extremities,vital signs within normal limits.

## 2015-07-06 NOTE — Evaluation (Signed)
Physical Therapy Evaluation Patient Details Name: Austin [name] MRN: [id number] DOB: [birth year] Today's Date: 07/06/2015   History of Present Illness  Pt is a 74 year-old male admitted to the hospital with COPD exacerbation and SOB.   Clinical Impression  Pt presents with hx of COPD, gastric ulcer, DVT, PE , HTN, hypercholesteremia, DM, GERD, and A-fib. Pt ambulated 30 ft on 3L O2 after falling to 76% O2 following 10 ft ambulation. Examination reveals that pt has LE weakness and decreased activity tolerance (reference O2 qualification numbers below). Transfers are performed at [facility] for safety within the hospital. Pt will benefit from further skilled PT in order to return him to premorbid state of being independent with ADLs.   SATURATION QUALIFICATIONS: (This note is used to comply with regulatory documentation for home oxygen)  Patient Saturations on Room Air at Rest = 87%  Patient Saturations on Room Air while Ambulating = Not performed   Patient Saturations on 2L Liters of oxygen while Ambulating = 76%  Patient Saturations on 3L Liters of oxygen while Ambulating = 84%  Please briefly explain why patient needs home oxygen: Pt's oxygen falls too low on room air for pt to safely continue functional activities.   Follow Up Recommendations SNF    Equipment Recommendations  Rolling walker with 5" wheels    Recommendations for Other Services       Precautions / Restrictions Precautions Precautions: Fall Restrictions Weight Bearing Restrictions: No      Mobility  Bed Mobility               General bed mobility comments: Pt was located in chair at time of consult. Not assessed   Transfers Overall transfer level: Needs assistance Equipment used: Rolling walker (2 wheeled) Transfers: Sit to/from Stand Sit to Stand: Min guard         General transfer comment: Pt transfers with CGA, requiring cues for hand placement prior to transfer with RW. Pt demonstrated good  stability with no LOB.   Ambulation/Gait Ambulation/Gait assistance: Min guard Ambulation Distance (Feet): 30 Feet Assistive device: Rolling walker (2 wheeled) Gait Pattern/deviations: Step-through pattern;Decreased step length - right;Decreased step length - left;Shuffle Gait velocity: decreased   General Gait Details: Pt needs cueing to walk within RW as well as to take longer steps. Pt ambulated 10 ft without the RW and it was determined that his gait is more steady with less of a shuffle with a RW. Refer to clinical impression for vitals during ambulation   Stairs            Wheelchair Mobility    Modified Rankin (Stroke Patients Only)       Balance Overall balance assessment: No apparent balance deficits (not formally assessed)                                           Pertinent Vitals/Pain Pain Assessment: No/denies pain    Home Living Family/patient expects to be discharged to:: Private residence Living Arrangements: Spouse/significant other Available Help at Discharge: Family Type of Home: House Home Access: Level entry     Home Layout: One level        Prior Function Level of Independence: Independent         Comments: Pt was previously indep with all ADLs     Hand Dominance        Extremity/Trunk Assessment  Upper Extremity Assessment: Overall WFL for tasks assessed           Lower Extremity Assessment: Generalized weakness         Communication   Communication: No difficulties  Cognition Arousal/Alertness: Awake/alert Behavior During Therapy: WFL for tasks assessed/performed Overall Cognitive Status: Within Functional Limits for tasks assessed                      General Comments      Exercises        Assessment/Plan    PT Assessment Patient needs continued PT services  PT Diagnosis Difficulty walking;Abnormality of gait;Generalized weakness   PT Problem List Decreased strength;Decreased  range of motion;Decreased activity tolerance;Decreased balance;Decreased mobility;Decreased coordination;Decreased cognition  PT Treatment Interventions DME instruction;Gait training;Stair training;Functional mobility training;Therapeutic activities;Therapeutic exercise;Balance training;Neuromuscular re-education;Patient/family education   PT Goals (Current goals can be found in the Care Plan section) Acute Rehab PT Goals Patient Stated Goal: to return home PT Goal Formulation: With patient Time For Goal Achievement: 08/20/15 Potential to Achieve Goals: Good    Frequency Min 2X/week   Barriers to discharge        Co-evaluation               End of Session Equipment Utilized During Treatment: Gait belt;Oxygen Activity Tolerance: Patient tolerated treatment well;Patient limited by fatigue Patient left: in chair;with call bell/phone within reach;with chair alarm set Nurse Communication: Mobility status         Time: 7829-5621 PT Time Calculation (min) (ACUTE ONLY): 23 min   Charges:         PT G CodesBenna Dunks 07-24-2015, 4:11 PM  Benna Dunks, SPT. [phone]

## 2015-07-06 NOTE — Clinical Social Work Note (Signed)
Clinical Social Work Assessment  Patient Details  Name: Austin [name] MRN: [id number] Date of Birth: [birth year]  Date of referral:  07/06/15               Reason for consult:  Facility Placement                Permission sought to share information with:  Facility Medical sales representative, Family Supports Permission granted to share information::  No (pt would prefer to speak to his family first.)  Name::        Agency::     Relationship::     Contact Information:     Housing/Transportation Living arrangements for the past 2 months:  Single Family Home Source of Information:  Patient Patient Interpreter Needed:  None Criminal Activity/Legal Involvement Pertinent to Current Situation/Hospitalization:  No - Comment as needed Significant Relationships:  Adult Children, Other Family Members, Spouse Lives with:  Spouse Do you feel safe going back to the place where you live?  Yes Need for family participation in patient care:  Yes (Comment)  Care giving concerns:  Pt stated that he would more thank likely want to go home but he would discuss SNF placement with his family.     Social Worker assessment / plan:  CSW spoke to pt.  He was sitting up in his chair oriented x3.  He did have difficulty with current year but he was able to name his brithdate and the current president.  CSW explained what SNF placement was.  Pt stated that he would think about going.  He would like to discuss it with his family first.  He did not want CSW to send out a bed search or contact his family until he had had a chance to speak with them.  CSW will f/u tomorrow.  Employment status:  Retired Health and safety inspector:  [employer] PT Recommendations:  Skilled Nursing Facility Information / Referral to community resources:     Patient/Family's Response to care:  Pt would like time to discuss placement with his family Patient/Family's Understanding of and Emotional Response to Diagnosis, Current Treatment, and  Prognosis:  Pt verbalized his understanding and would like to discuss placement with his family before making a decision.  Emotional Assessment Appearance:  Appears stated age Attitude/Demeanor/Rapport:   (polite) Affect (typically observed):   (short of breath) Orientation:  Oriented to Self, Oriented to Place, Oriented to  Time, Fluctuating Orientation (Suspected and/or reported Sundowners) Alcohol / Substance use:  Never Used Psych involvement (Current and /or in the community):  No (Comment)  Discharge Needs  Concerns to be addressed:  Care Coordination Readmission within the last 30 days:  No Current discharge risk:  Physical Impairment Barriers to Discharge:  No Barriers Identified   Chauncy Passy, LCSW 07/06/2015, 3:43 PM

## 2015-07-06 NOTE — Progress Notes (Signed)
Subjective:   still short of breath congestion and mild cough minimal chest pain. Patient denies fever chills or  Sweats. The patient is  Still smoking.  Objective:  Vital Signs in the last 24 hours: Temp:  [97.5 F (36.4 C)-97.9 F (36.6 C)] 97.7 F (36.5 C) (07/26 1113) Pulse Rate:  [69-90] 69 (07/26 1113) Resp:  [17-20] 17 (07/26 1113) BP: (120-146)/(67-95) 146/95 mmHg (07/26 1113) SpO2:  [73 %-100 %] 73 % (07/26 1113) Weight:  [95.165 kg (209 lb 12.8 oz)] 95.165 kg (209 lb 12.8 oz) (07/26 0454)  Intake/Output from previous day: 07/25 0701 - 07/26 0700 In: 960 [P.O.:960] Out: 400 [Urine:400] Intake/Output from this shift: Total I/O In: 240 [P.O.:240] Out: 350 [Urine:350]  Physical Exam: General appearance: alert, cooperative and appears stated age Neck: no adenopathy, no carotid bruit, no JVD, supple, symmetrical, trachea midline and thyroid not enlarged, symmetric, no tenderness/mass/nodules Lungs: diminished breath sounds bibasilar and bilaterally and rhonchi bibasilar Heart: regular rate and rhythm, S1, S2 normal, no murmur, click, rub or gallop Abdomen: soft, non-tender; bowel sounds normal; no masses,  no organomegaly Extremities: extremities normal, atraumatic, no cyanosis or edema Pulses: 2+ and symmetric Skin: Skin color, texture, turgor normal. No rashes or lesions Neurologic: Alert and oriented X 3, normal strength and tone. Normal symmetric reflexes. Normal coordination and gait  Lab Results:  Recent Labs  07/04/15 1043 07/05/15 0424  WBC 4.8 5.2  HGB 13.3 12.7*  PLT 131* 117*    Recent Labs  07/05/15 0424 07/06/15 0530  NA 140 136  K 3.2* 4.4  CL 99* 98*  CO2 31 28  GLUCOSE 75 240*  BUN 20 24*  CREATININE 1.37* 1.43*    Recent Labs  07/04/15 1636 07/04/15 2103  TROPONINI 0.11* 0.09*   Hepatic Function Panel No results for input(s): PROT, ALBUMIN, AST, ALT, ALKPHOS, BILITOT, BILIDIR, IBILI in the last 72 hours. No results for input(s):  CHOL in the last 72 hours. No results for input(s): PROTIME in the last 72 hours.  Imaging: Imaging results have been reviewed  Cardiac Studies:  Assessment/Plan:   COPD with exacerbation Arrhythmia Atrial Fibrillation Cardiomyopathy Chest Pain CHF Edema Palpitations Shortness of Breath Syncope   weight loss  diabetes  smoking . PLAN  COPD exacerbation  Continue supplemental oxygen inhalers steroid therapy broad-spectrum antibiotics  shortness of breath continue supplemental oxygen Lasix therapy as well as inhalers  advise patient to quit smoking as part of secondary prevention  continue blood pressure control with lisinopril  agree with lipid management with Pravachol  continue antibiotics for possible bronchitis  consider GI workup for weight loss  minimal to no evidence of significant congestive heart failure  this appears to mostly to be pulmonary  continue aggressive pulmonary care outpatient follow-up with pulmonary as an outpatient    LOS: 2 days    CALLWOOD,DWAYNE D. 07/06/2015, 12:36 PM

## 2015-07-06 NOTE — Progress Notes (Signed)
Inpatient Diabetes Program Recommendations  AACE/ADA: New Consensus Statement on Inpatient Glycemic Control (2013)  Target Ranges:  Prepandial:   less than 140 mg/dL      Peak postprandial:   less than 180 mg/dL (1-2 hours)      Critically ill patients:  140 - 180 mg/dL   Results for GARV, KUECHLE (MRN [id number]) as of 07/06/2015 11:14  Ref. Range 07/05/2015 08:37 07/05/2015 11:55 07/05/2015 16:03 07/05/2015 20:35 07/06/2015 07:19 07/06/2015 10:57  Glucose-Capillary Latest Ref Range: 65-99 mg/dL 89 409 (H) 811 (H) 914 (H) 248 (H) 223 (H)   Diabetes history: DM2 Outpatient Diabetes medications: Metformin 500 BID, Januvia 100 daily Current orders for Inpatient glycemic control: Novolog 0-9 units TID with meals, Tradjenta 5 mg daily  Inpatient Diabetes Program Recommendations Correction (SSI): Please consider increasing Novolog correction to moderate scale and adding Novolog bedtime correction scale. Insulin - Meal Coverage: If steriods are continued as ordered (Solumedrol 60 mg Q6H), please consider ordering Novolog 4 units TID with meals for meal coverage (in additon to Novolog correction).  Note: Patient is currently ordered Solumedrol 60 mg Q6H which is likely cause of hyperglycemia.  Thanks, Orlando Penner, RN, MSN, CCRN, CDE Diabetes Coordinator Inpatient Diabetes Program [phone] (Team Pager from 8am to 5pm) [phone] ([place] office) [phone] [facility] office) [phone] [facility] office)

## 2015-07-07 LAB — BASIC METABOLIC PANEL
Anion gap: 10 (ref 5–15)
BUN: 25 mg/dL — ABNORMAL HIGH (ref 6–20)
CO2: 29 mmol/L (ref 22–32)
Calcium: 10 mg/dL (ref 8.9–10.3)
Chloride: 95 mmol/L — ABNORMAL LOW (ref 101–111)
Creatinine, Ser: 1.34 mg/dL — ABNORMAL HIGH (ref 0.61–1.24)
GFR calc non Af Amer: 51 mL/min — ABNORMAL LOW (ref >60)
GFR, EST AFRICAN AMERICAN: 59 mL/min — AB (ref >60)
Glucose, Bld: 243 mg/dL — ABNORMAL HIGH (ref 65–99)
Potassium: 4.9 mmol/L (ref 3.5–5.1)
Sodium: 134 mmol/L — ABNORMAL LOW (ref 135–145)

## 2015-07-07 LAB — GLUCOSE, CAPILLARY
GLUCOSE-CAPILLARY: 230 mg/dL — AB (ref 65–99)
Glucose-Capillary: 239 mg/dL — ABNORMAL HIGH (ref 65–99)
Glucose-Capillary: 246 mg/dL — ABNORMAL HIGH (ref 65–99)
Glucose-Capillary: 259 mg/dL — ABNORMAL HIGH (ref 65–99)

## 2015-07-07 LAB — EXPECTORATED SPUTUM ASSESSMENT W GRAM STAIN, RFLX TO RESP C: Special Requests: NORMAL

## 2015-07-07 LAB — EXPECTORATED SPUTUM ASSESSMENT W REFEX TO RESP CULTURE

## 2015-07-07 LAB — HEMOGLOBIN: Hemoglobin: 13 g/dL (ref 13.0–18.0)

## 2015-07-07 MED ORDER — INSULIN ASPART 100 UNIT/ML ~~LOC~~ SOLN
0.0000 [IU] | Freq: Every day | SUBCUTANEOUS | Status: DC
  Administered 2015-07-07: 3 [IU] via SUBCUTANEOUS
  Filled 2015-07-07: qty 3

## 2015-07-07 MED ORDER — CEFTRIAXONE SODIUM IN DEXTROSE 20 MG/ML IV SOLN: 1.0000 g | INTRAVENOUS | Status: DC

## 2015-07-07 MED ORDER — METHYLPREDNISOLONE SODIUM SUCC 125 MG IJ SOLR
60.0000 mg | Freq: Four times a day (QID) | INTRAMUSCULAR | Status: DC
  Administered 2015-07-07 – 2015-07-08 (×4): 60 mg via INTRAVENOUS
  Filled 2015-07-07 (×4): qty 2

## 2015-07-07 MED ORDER — DEXTROSE 5 % IV SOLN
1.0000 g | INTRAVENOUS | Status: DC
  Administered 2015-07-07 – 2015-07-08 (×2): 1 g via INTRAVENOUS
  Filled 2015-07-07 (×3): qty 10

## 2015-07-07 NOTE — Progress Notes (Signed)
Clinical Child psychotherapist (CSW) received call from patient's wife Wynona Canes. CSW explained that PT is recommending SNF. Wife reported that she wants to take patient home and has assistance from her grandchildren. Wife also reported that she would take off work if she needed to. Wife refused SNF. RN Case Manager aware of above. Please reconsult if future social work needs arise. CSW signing off.   Jetta Lout, LCSWA [phone]

## 2015-07-07 NOTE — Progress Notes (Signed)
Communicated with Dr. Dimas Aguas r/t pt's BS at bedtime 259. Informed Dr. Dimas Aguas that pt did not have coverage at bedtime. Dr. Dimas Aguas stated that he will add coverage at bedtime.

## 2015-07-07 NOTE — Progress Notes (Signed)
SATURATION QUALIFICATIONS: (This note is used to comply with regulatory documentation for home oxygen)  Patient Saturations on Room Air at Rest = 88%  Patient Saturations on Room Air while Ambulating = 85%  Patient Saturations on 2 Liters of oxygen while Ambulating = 90%  Please briefly explain why patient needs home oxygen: 

## 2015-07-07 NOTE — Progress Notes (Signed)
Patient says that he did discuss possible skilled nursing placement with his wife but did not make a decision.  Discussed need for determination with CSW and she will speak with patient's wife.  Patient has private insurance that will require prior auth.  It is anticipated that patient that patient will be medically stable for dischrge within the next 24 hours.  CSW will contact patient's wife to discuss

## 2015-07-07 NOTE — Progress Notes (Signed)
Inpatient Diabetes Program Recommendations  AACE/ADA: New Consensus Statement on Inpatient Glycemic Control (2013)  Target Ranges:  Prepandial:   less than 140 mg/dL      Peak postprandial:   less than 180 mg/dL (1-2 hours)      Critically ill patients:  140 - 180 mg/dL   Results for PRATT, BRESS (MRN [id number]) as of 07/07/2015 11:11  Ref. Range 07/06/2015 07:19 07/06/2015 10:57 07/06/2015 16:02 07/06/2015 20:11 07/07/2015 07:29  Glucose-Capillary Latest Ref Range: 65-99 mg/dL 409 (H) 811 (H) 914 (H) 233 (H) 230 (H)   Diabetes history: DM2 Outpatient Diabetes medications: Metformin 500 BID, Januvia 100 daily Current orders for Inpatient glycemic control: Novolog 0-9 units TID with meals, Tradjenta 5 mg daily  Inpatient Diabetes Program Recommendations Correction (SSI): Please consider increasing Novolog correction to moderate scale and ADDING Novolog bedtime correction scale. Insulin - Meal Coverage: Please consider ordering Novolog 4 units TID with meals for meal coverage (in additon to Novolog correction) since post prandial glucose is consistently elevated (likely from steroids).  Note: Patient is currently ordered Solumedrol 60 mg Q6H which is contributing to hyperglycemia.  Thanks, Orlando Penner, RN, MSN, CCRN, CDE Diabetes Coordinator Inpatient Diabetes Program [phone] (Team Pager from 8am to 5pm) [phone] ([place] office) [phone] [facility] office) [phone] [facility] office)

## 2015-07-07 NOTE — Progress Notes (Signed)
Patient ID: Austin [name], male   DOB: 02-08-1941, 74 y.o.   MRN: [id number] [facility] PROGRESS NOTE  PCP: Austin Miss, MD  HPI/Subjective: Patient states that his breathing is not back to his usual breathing. Still short of breath and coughing. Still wheezing. He felt very short of breath in the bathroom today.   Objective: Filed Vitals:   07/07/15 0824  BP: 130/84  Pulse: 90  Temp: 97.5 F (36.4 C)  Resp: 17    Filed Weights   07/05/15 0502 07/06/15 0454 07/07/15 0425  Weight: 94.303 kg (207 lb 14.4 oz) 95.165 kg (209 lb 12.8 oz) 96.1 kg (211 lb 13.8 oz)    ROS: Review of Systems  Constitutional: Negative for fever and chills.  Eyes: Negative for blurred vision.  Respiratory: Positive for cough, sputum production and shortness of breath.   Cardiovascular: Negative for chest pain.  Gastrointestinal: Negative for nausea, vomiting, abdominal pain, diarrhea and constipation.  Genitourinary: Negative for dysuria.  Musculoskeletal: Negative for joint pain.  Neurological: Negative for dizziness and headaches.   Exam: Physical Exam  Constitutional: He is oriented to person, place, and time.  HENT:  Nose: No mucosal edema.  Mouth/Throat: No oropharyngeal exudate or posterior oropharyngeal edema.  Eyes: Conjunctivae, EOM and lids are normal. Pupils are equal, round, and reactive to light.  Neck: No JVD present. Carotid bruit is not present. No edema present. No thyroid mass and no thyromegaly present.  Cardiovascular: S1 normal and S2 normal.  Exam reveals no gallop.   No murmur heard. Pulses:      Dorsalis pedis pulses are 2+ on the right side, and 2+ on the left side.  Respiratory: No accessory muscle usage. No respiratory distress. He has decreased breath sounds in the right middle field, the right lower field, the left middle field and the left lower field. He has wheezes in the right middle field, the right lower field, the left middle field  and the left lower field. He has no rhonchi. He has no rales.  GI: Soft. Bowel sounds are normal. There is no tenderness.  Musculoskeletal:       Right ankle: He exhibits swelling.       Left ankle: He exhibits swelling.  Lymphadenopathy:    He has no cervical adenopathy.  Neurological: He is alert and oriented to person, place, and time. No cranial nerve deficit.  Skin: Skin is warm. Nails show no clubbing.  Chronic lower extremity discoloration. Erythema and warmth is now felt over the lower extremities. Patient does have scabs on bilateral lower extremities.  Psychiatric: He has a normal mood and affect.    Data Reviewed: Basic Metabolic Panel:  Recent Labs Lab 07/04/15 1043 07/05/15 0424 07/06/15 0530  NA 140 140 136  K 3.0* 3.2* 4.4  CL 101 99* 98*  CO2 28 31 28   GLUCOSE 100* 75 240*  BUN 18 20 24*  CREATININE 1.53* 1.37* 1.43*  CALCIUM 9.6 9.3 9.6  MG  --   --  1.3*   CBC:  Recent Labs Lab 07/04/15 1043 07/05/15 0424 07/07/15 0403  WBC 4.8 5.2  --   NEUTROABS 3.7  --   --   HGB 13.3 12.7* 13.0  HCT 41.7 38.4*  --   MCV 95.8 95.8  --   PLT 131* 117*  --    Cardiac Enzymes:  Recent Labs Lab 07/04/15 1043 07/04/15 1327 07/04/15 1636 07/04/15 2103  TROPONINI 0.11* 0.10* 0.11* 0.09*    Scheduled  Meds: . azithromycin  250 mg Oral Daily  . budesonide-formoterol  2 puff Inhalation BID  . cefTRIAXone (ROCEPHIN)  IV  1 g Intravenous Q24H  . furosemide  60 mg Oral Daily  . heparin  5,000 Units Subcutaneous 3 times per day  . insulin aspart  0-9 Units Subcutaneous TID WC  . ipratropium-albuterol  3 mL Inhalation QID  . linagliptin  5 mg Oral Daily  . lisinopril  40 mg Oral Daily  . methylPREDNISolone (SOLU-MEDROL) injection  40 mg Intravenous Q6H  . nicotine  21 mg Transdermal Daily  . pantoprazole  40 mg Oral Daily  . polyethylene glycol  17 g Oral Daily  . potassium chloride  20 mEq Oral BID  . pravastatin  40 mg Oral q1800  . tiotropium  18 mcg  Inhalation Daily    Assessment/Plan:  1. Acute respiratory failure with hypoxia- patient desaturated with walking. Likely will need home oxygen. 2. COPD exacerbation- increase Solu-Medrol to 60 mg every 6 hours continue nebulizer treatments. Continue Spiriva and Symbicort. Zithromax. 3. Acute diastolic congestive heart failure- switch Lasix back to oral dose. Hold on beta blocker with COPD exacerbation. 4. Hyperlipidemia unspecified continue pravastatin 5. Initial esophageal reflux disease without esophagitis continue Protonix 6. Diabetes mellitus type 2- sugars will be high with Solu-Medrol continue sliding scale at this point. 7. Weakness and decreased exercise capacity -physical therapy recommended rehabilitation. He will talk this over with his wife. 8. Cellulitis bilateral lower extremities- Rocephin added today.  Code Status:     Code Status Orders        Start     Ordered   07/04/15 1422  Full code   Continuous     07/04/15 1422     Family Communication: Spoke with wife yesterday afternoon on the phone. We'll try to reach her today. Disposition Plan: Possible rehabilitation once breathing better.  Time spent: 25 minutes  Austin [name]  [facility]

## 2015-07-07 NOTE — Clinical Social Work Note (Signed)
CSW spoke to pt and inquired about his interest in STR.  Pt stated he would not be able to make a decision without consulting with his wife.  Pt explained that his wife Wynona Canes works at [employer] [phone].  CSW attempted to contact wife but she was unavailable until after 3pm.  CSW left a message and requested a return call.  CSW will continue to follow and assist with d/c planning needs.  [place], [place] [phone]

## 2015-07-07 NOTE — Clinical Social Work Note (Signed)
CSW attempted to meet with pt to follow-up on his decision about STR.  Pt was resting.  CSW will attempt to meet with pt at a later time.  CSW will continue to follow and assist with discharge planning needs.  [place], [place] [phone]

## 2015-07-07 NOTE — Progress Notes (Signed)
   07/07/15 2100  Clinical Encounter Type  Visited With Patient  Visit Type Spiritual support  Spiritual Encounters  Spiritual Needs Prayer  Stress Factors  Patient Stress Factors Health changes   Status: alert and oriented/CHF Age/Sex: male 54 Faith: [employer] Family: none present, has 1 son and grands that are supportive, he said Visit Assessment: The patient shared that he may be going home tomorrow and that his family had just left to go home. He talked to the chaplain about his vegetable garden. He seems to be in good spirits.   Pastoral care can be reached via pager [phone] or by submitting an order online

## 2015-07-08 LAB — GLUCOSE, CAPILLARY
Glucose-Capillary: 270 mg/dL — ABNORMAL HIGH (ref 65–99)
Glucose-Capillary: 343 mg/dL — ABNORMAL HIGH (ref 65–99)

## 2015-07-08 MED ORDER — NICOTINE 21 MG/24HR TD PT24: 21.0000 mg | MEDICATED_PATCH | Freq: Every day | TRANSDERMAL | Status: DC

## 2015-07-08 MED ORDER — AZITHROMYCIN 250 MG PO TABS: ORAL_TABLET | ORAL | Status: DC

## 2015-07-08 MED ORDER — PREDNISONE 5 MG PO TABS: 5.0000 mg | ORAL_TABLET | Freq: Every day | ORAL | Status: DC

## 2015-07-08 MED ORDER — CEFUROXIME AXETIL 500 MG PO TABS: 500.0000 mg | ORAL_TABLET | Freq: Two times a day (BID) | ORAL | Status: DC

## 2015-07-08 MED ORDER — POTASSIUM CHLORIDE CRYS ER 20 MEQ PO TBCR: 20.0000 meq | EXTENDED_RELEASE_TABLET | Freq: Every day | ORAL | Status: DC

## 2015-07-08 MED ORDER — TIOTROPIUM BROMIDE MONOHYDRATE 18 MCG IN CAPS: 18.0000 ug | ORAL_CAPSULE | Freq: Every day | RESPIRATORY_TRACT | Status: DC

## 2015-07-08 MED ORDER — ALBUTEROL SULFATE (2.5 MG/3ML) 0.083% IN NEBU: 2.5000 mg | INHALATION_SOLUTION | Freq: Four times a day (QID) | RESPIRATORY_TRACT | Status: DC | PRN

## 2015-07-08 NOTE — Discharge Summary (Signed)
[facility] - [place] at [facility]   PATIENT NAME: Austin [name]    MR#:  [id number]  DATE OF BIRTH:  May 11, 1941  DATE OF ADMISSION:  07/04/2015 ADMITTING PHYSICIAN: Altamese Dilling, MD  DATE OF DISCHARGE: 07/08/2015  PRIMARY CARE PHYSICIAN: Kristeen Miss, MD    ADMISSION DIAGNOSIS:  CHF (congestive heart failure) [I50.9] Hypoxia [R09.02] Elevated troponin [R79.89] Acute on chronic congestive heart failure, unspecified congestive heart failure type [I50.9]  DISCHARGE DIAGNOSIS:  Principal Problem:   CHF (NYHA class IV, ACC/AHA stage D) Active Problems:   CHF (congestive heart failure)   SECONDARY DIAGNOSIS:   Past Medical History  Diagnosis Date  . COPD (chronic obstructive pulmonary disease)   . Gastric ulcer   . DVT (deep venous thrombosis)   . PE (pulmonary embolism)   . Hypertension   . Hypercholesteremia   . Diabetes mellitus without complication   . GERD (gastroesophageal reflux disease)   . Atrial fibrillation     HOSPITAL COURSE:   1. Acute respiratory failure with hypoxia now chronic respiratory failure. The patient was on oxygen the entire hospital course. He did desaturate with minimal ambulation. He qualified for home oxygen and this was set up. 2. COPD exacerbation and tobacco abuse. Smoking cessation counseling done 3 minutes by me. Nicotine patch prescribed. It took a few days to get his lungs clear. I will sent home on a prednisone taper. Nebulizer ordered. Spiriva added. I will sent home on Ceftin and Zithromax. The patient's major issue is he must stop smoking. 3. Chronic diastolic congestive heart failure- initially it was believed that it was acute heart failure. But I believe this was more COPD exacerbation. He will go back on his home dose of Lasix. He was switched to oral Lasix soon after admission. Can go back on metoprolol as outpatient. 4. Diabetes type II without complication- sugars will be high on  steroids taper. 5. Essential hypertension blood pressure stable on usual medication. 6. Hyperlipidemia unspecified continue statin. 7. Elevated troponin this is demand ischemia secondary to acute respiratory failure.  DISCHARGE CONDITIONS:   Satisfactory  CONSULTS OBTAINED:  Treatment Team:  Alwyn Pea, MD  DRUG ALLERGIES:   Allergies  Allergen Reactions  . Contrast Media [Iodinated Diagnostic Agents] Nausea And Vomiting    DISCHARGE MEDICATIONS:   Current Discharge Medication List    START taking these medications   Details  albuterol (PROVENTIL) (2.5 MG/3ML) 0.083% nebulizer solution Take 3 mLs (2.5 mg total) by nebulization every 6 (six) hours as needed for wheezing or shortness of breath. Qty: 75 mL, Refills: 12    azithromycin (ZITHROMAX) 250 MG tablet One tab daily for three more days Qty: 3 each, Refills: 0    cefUROXime (CEFTIN) 500 MG tablet Take 1 tablet (500 mg total) by mouth 2 (two) times daily with a meal. Qty: 16 tablet, Refills: 0    nicotine (NICODERM CQ - DOSED IN MG/24 HOURS) 21 mg/24hr patch Place 1 patch (21 mg total) onto the skin daily. Qty: 28 patch, Refills: 0    potassium chloride SA (K-DUR,KLOR-CON) 20 MEQ tablet Take 1 tablet (20 mEq total) by mouth daily. Qty: 30 tablet, Refills: 0    predniSONE (DELTASONE) 5 MG tablet Take 1 tablet (5 mg total) by mouth daily with breakfast. 4 tabs day1; 3 tabs day2; 2 tabs day3; 1 tab day4,5 then stop Qty: 11 tablet, Refills: 0    tiotropium (SPIRIVA) 18 MCG inhalation capsule Place 1 capsule (18 mcg total) into  inhaler and inhale daily. Qty: 30 capsule, Refills: 0      CONTINUE these medications which have NOT CHANGED   Details  albuterol-ipratropium (COMBIVENT) 18-103 MCG/ACT inhaler Inhale 2 puffs into the lungs 4 (four) times daily.    budesonide-formoterol (SYMBICORT) 160-4.5 MCG/ACT inhaler Inhale 2 puffs into the lungs 2 (two) times daily.    furosemide (LASIX) 40 MG tablet Take 40 mg  by mouth 2 (two) times daily.     lisinopril (PRINIVIL,ZESTRIL) 40 MG tablet Take 40 mg by mouth daily.    lovastatin (MEVACOR) 40 MG tablet Take 40 mg by mouth daily with supper. Pt should take with food.    metFORMIN (GLUMETZA) 500 MG (MOD) 24 hr tablet Take 500 mg by mouth 2 (two) times daily with a meal.    metoprolol tartrate (LOPRESSOR) 25 MG tablet Take 25 mg by mouth 2 (two) times daily.     nitroGLYCERIN (NITROSTAT) 0.4 MG SL tablet Place 0.4 mg under the tongue every 5 (five) minutes x 3 doses as needed for chest pain. If no relief call 911 or go to emergency room.    omeprazole (PRILOSEC) 20 MG capsule Take 20 mg by mouth 2 (two) times daily.    sitaGLIPtin (JANUVIA) 100 MG tablet Take 100 mg by mouth daily.         DISCHARGE INSTRUCTIONS:   Follow-up in one week with your doctor in [place] Follow-up in 2 weeks with Dr. Juliann Pares cardiology  If you experience worsening of your admission symptoms, develop shortness of breath, life threatening emergency, suicidal or homicidal thoughts you must seek medical attention immediately by calling 911 or calling your MD immediately  if symptoms less severe.  You Must read complete instructions/literature along with all the possible adverse reactions/side effects for all the Medicines you take and that have been prescribed to you. Take any new Medicines after you have completely understood and accept all the possible adverse reactions/side effects.   Please note  You were cared for by a hospitalist during your hospital stay. If you have any questions about your discharge medications or the care you received while you were in the hospital after you are discharged, you can call the unit and asked to speak with the hospitalist on call if the hospitalist that took care of you is not available. Once you are discharged, your primary care physician will handle any further medical issues. Please note that NO REFILLS for any discharge  medications will be authorized once you are discharged, as it is imperative that you return to your primary care physician (or establish a relationship with a primary care physician if you do not have one) for your aftercare needs so that they can reassess your need for medications and monitor your lab values.    Today   CHIEF COMPLAINT:   Chief Complaint  Patient presents with  . Shortness of Breath    HISTORY OF PRESENT ILLNESS:  Austin [name]  is a 74 y.o. male presented with shortness of breath and found to be in acute respiratory failure requiring oxygen. Initially believed to be secondary to heart failure. Likely more COPD exacerbation.   VITAL SIGNS:  Blood pressure 125/76, pulse 72, temperature 97.8 F (36.6 C), temperature source Oral, resp. rate 20, height 6' (1.829 m), weight 102.105 kg (225 lb 1.6 oz), SpO2 91 %.  PHYSICAL EXAMINATION:  GENERAL:  74 y.o.-year-old patient lying in the bed with no acute distress.  EYES: Pupils equal, round, reactive to light and  accommodation. No scleral icterus. Extraocular muscles intact.  HEENT: Head atraumatic, normocephalic. Oropharynx and nasopharynx clear.  NECK:  Supple, no jugular venous distention. No thyroid enlargement, no tenderness.  LUNGS: Normal breath sounds bilaterally, no wheezing, rales,rhonchi or crepitation. No use of accessory muscles of respiration.  CARDIOVASCULAR: S1, S2 normal. No murmurs, rubs, or gallops.  ABDOMEN: Soft, non-tender, non-distended. Bowel sounds present. No organomegaly or mass.  EXTREMITIES: 2+ edema, no cyanosis, or clubbing.  NEUROLOGIC: Cranial nerves II through XII are intact. Muscle strength 5/5 in all extremities. Sensation intact. Gait not checked.  PSYCHIATRIC: The patient is alert and oriented x 3.  SKIN: Chronic lower extremity discoloration with small ulcers. The erythema that was seen yesterday had faded. No increased warmth.  DATA REVIEW:   CBC  Recent Labs Lab 07/05/15 0424  07/07/15 0403  WBC 5.2  --   HGB 12.7* 13.0  HCT 38.4*  --   PLT 117*  --     Chemistries   Recent Labs Lab 07/06/15 0530 07/07/15 0403  NA 136 134*  K 4.4 4.9  CL 98* 95*  CO2 28 29  GLUCOSE 240* 243*  BUN 24* 25*  CREATININE 1.43* 1.34*  CALCIUM 9.6 10.0  MG 1.3*  --     Cardiac Enzymes  Recent Labs Lab 07/04/15 2103  TROPONINI 0.09*    Microbiology Results  Results for orders placed or performed during the hospital encounter of 07/04/15  Culture, expectorated sputum-assessment     Status: None   Collection Time: 07/07/15  3:25 PM  Result Value Ref Range Status   Specimen Description EXPECTORATED SPUTUM  Final   Special Requests Normal  Final   Sputum evaluation THIS SPECIMEN IS ACCEPTABLE FOR SPUTUM CULTURE  Final   Report Status 07/07/2015 FINAL  Final  Culture, respiratory (NON-Expectorated)     Status: None (Preliminary result)   Collection Time: 07/07/15  3:25 PM  Result Value Ref Range Status   Specimen Description EXPECTORATED SPUTUM  Final   Special Requests Normal Reflexed from [id number]  Final   Gram Stain PENDING  Incomplete   Culture   Final    LIGHT GROWTH GRAM NEGATIVE RODS IDENTIFICATION AND SUSCEPTIBILITIES TO FOLLOW    Report Status PENDING  Incomplete    Management plans discussed with the patient, family and they are in agreement.  CODE STATUS:     Code Status Orders        Start     Ordered   07/04/15 1422  Full code   Continuous     07/04/15 1422      TOTAL TIME TAKING CARE OF THIS PATIENT: 40 minutes.    Alford Highland M.D on 07/08/2015 at 10:22 AM  Between 7am to 6pm - Pager - [phone]  After 6pm go to [email or web address] - password [email or web address] [facility]  [facility]  Office  [phone]  CC: Primary care physician; Kristeen Miss, MD  Patient sees Dr. Juanna Cao

## 2015-07-08 NOTE — Progress Notes (Addendum)
A & O. Up to BR and tolerated it well. Takes meds ok. IVs removed. Discharge instructions given to pt. Family to take pt home. Oxygen tank and nebulizer at bedside and family instructed on how to use it. Pt nor family have any other concerns at this time.

## 2015-07-08 NOTE — Care Management Important Message (Signed)
Important Message  Patient Details  Name: Austin [name] MRN: [id number] Date of Birth: Sep 14, 1941   Medicare Important Message Given:  Yes-second notification given    Eber Hong, RN 07/08/2015, 10:56 AM

## 2015-07-08 NOTE — Progress Notes (Signed)
Physical Therapy Treatment Patient Details Name: Austin [name] MRN: [id number] DOB: 1941/02/21 Today's Date: 07/08/2015    History of Present Illness Pt is a 74 year-old male admitted to the hospital with COPD exacerbation and SOB.     PT Comments    Pt making good progress towards goals this date. Ambulation distance has increased and functional strength appears better with transfers. Pt will still benefit from skilled PT to address his activity tolerance deficits for improved ability to function safely at home. Pt O2sat prior to ambulation was 94% on 2L, and immediately after ambulation his O2sat was at 88%. Pt with NAD during ambulation.   Follow Up Recommendations  Home health PT;Supervision/Assistance - 24 hour     Equipment Recommendations  Rolling walker with 5" wheels    Recommendations for Other Services       Precautions / Restrictions Precautions Precautions: Fall Restrictions Weight Bearing Restrictions: No    Mobility  Bed Mobility               General bed mobility comments: Pt was located in chair at time of consult. Not assessed   Transfers Overall transfer level: Needs assistance Equipment used: Rolling walker (2 wheeled) Transfers: Sit to/from Stand Sit to Stand: Supervision         General transfer comment: Pt able to transfer with supervision for safety within the hospital. Can perform with or without RW  Ambulation/Gait Ambulation/Gait assistance: Supervision Ambulation Distance (Feet): 120 Feet Assistive device: Rolling walker (2 wheeled) Gait Pattern/deviations: Decreased step length - right;Decreased step length - left Gait velocity: decreased   General Gait Details: Pt still needs some cues for taking bigger steps and for sequencing of walker with gait, but no noted sway or LOB during gait.    Stairs            Wheelchair Mobility    Modified Rankin (Stroke Patients Only)       Balance Overall balance  assessment: No apparent balance deficits (not formally assessed)                                  Cognition Arousal/Alertness: Awake/alert Behavior During Therapy: WFL for tasks assessed/performed Overall Cognitive Status: Within Functional Limits for tasks assessed                      Exercises      General Comments        Pertinent Vitals/Pain Pain Assessment: No/denies pain    Home Living                      Prior Function            PT Goals (current goals can now be found in the care plan section) Acute Rehab PT Goals Patient Stated Goal: to return home PT Goal Formulation: With patient Time For Goal Achievement: 08/20/15 Potential to Achieve Goals: Good Progress towards PT goals: Progressing toward goals    Frequency  Min 2X/week    PT Plan Discharge plan needs to be updated    Co-evaluation             End of Session Equipment Utilized During Treatment: Gait belt;Oxygen Activity Tolerance: Patient tolerated treatment well;Patient limited by fatigue Patient left: in bed;with bed alarm set;with call bell/phone within reach     Time: 1134-1147 PT Time Calculation (min) (ACUTE ONLY): 13  min  Charges:                       G CodesBenna Dunks Jul 15, 2015, 1:23 PM  Benna Dunks, SPT. [phone]

## 2015-07-08 NOTE — Discharge Instructions (Signed)
Heart Failure Clinic appointment on July 21, 2015 at 11:00am with Clarisa Kindred, FNP. Please call [phone] to reschedule.  Chronic Respiratory Failure Respiratory failure is when your lungs are not working well and your breathing (respiratory) system fails. When respiratory failure occurs, it is difficult for your lungs to get enough oxygen or get rid of carbon dioxide or both. Respiratory failure can be life threatening.  Respiratory failure can be acute or chronic. Acute respiratory failure is sudden, severe, and requires emergency medical treatment. Chronic respiratory failure is less severe, happens over time, and requires ongoing treatment.  CAUSES  Any problem affecting the heart or lungs can cause respiratory failure. Some of these causes may be:  Chronic bronchitis and emphysema (COPD).  Blood clot going to the lung (pulmonary embolism).  Having water in the lungs caused by heart failure, lung injury, or infection (pulmonary edema).  Collapsed lung (pneumothorax).  Pneumonia.  Pulmonary fibrosis.  Obesity.  Asthma.  Heart failure.  Any type of trauma to the chest that can make breathing difficult.  Nerve or muscle diseases making chest movements difficult. SYMPTOMS  Signs and symptoms of chronic respiratory failure include:  Shortness of breath (dyspnea) with or without activity.  Rapid, fast breathing (tachypnea).  Wheezing.  Fast heart rate.  Bluish color to the fingernail or toenail beds.  Confusion or drowsiness or both. DIAGNOSIS  Initial diagnosis requires a thorough history and a physical exam by your health care provider. Additional tests may include:  Chest X-ray.  CT scan of your lungs.  Ultrasound to check for blood clots.  Blood tests, such as an arterial blood gas test (ABG). This is a blood test that looks at the oxygen and carbon dioxide levels in your arterial blood.  Your vital signs will be taken. This includes your respiratory rate  (how many times a minute you are breathing), oxygen saturation (this measures the oxygen level in your blood), heart rate, and blood pressure. These numbers help your health care provider determine the next steps.  Electrocardiogram. TREATMENT  Treatment of chronic respiratory failure depends on the cause of the respiratory failure. Treatment can include the following:  Oxygen. Oxygen can be delivered through the following:  Nasal cannula. This is small tubing that goes in your nose to give you oxygen.  Face mask. A face mask covers your nose and mouth to give you oxygen.  Medicine. Different medicines can be given to help with breathing. These can include:  Nebulizers. Nebulizers deliver medicines to open the air passages (bronchodilators). These medicines help to open or relax the airways in the lungs so you can breathe better. They can also help loosen mucus from your lungs.  Diuretics. Diuretic medicines can help you breathe better by getting rid of extra fluid in your body.  Steroids. Steroid medicines can help decrease inflammation in your lungs.  Chest tube. If you have a collapsed lung (pneumothorax), a chest tube is placed to help reinflate the lung.  Non-invasive positive pressure ventilation (NPPV). This is a tight-fitting mask that goes over your nose and mouth. The mask has tubing that is attached to a machine. The machine blows air into the tubing, which helps to keep the tiny air sacs (alveoli) in your lungs open. This machine allows you to breathe on your own.  Ventilator. A ventilator is a breathing machine. When on a ventilator, a breathing tube is put into the lungs. A ventilator is used when you can no longer breathe well enough on your own.  You may have low oxygen levels or high carbon dioxide (CO2) levels in your blood. When you are on a ventilator, sedation and pain medicines are given to make you sleep so your lungs can heal. HOME CARE INSTRUCTIONS  Follow your  health care provider's directions about medicines and respiratory therapy.  Quit smoking if you smoke. SEEK MEDICAL CARE IF:  You have increasing shortness of breath and are less functional than you have been.  You have increased sputum, wheezing, coughing, or loss of energy.  You are on oxygen and are requiring more. SEEK IMMEDIATE MEDICAL CARE IF:  Your shortness of breath is significantly worse.  You are unable to say more than a few words without having to catch your breath.  You are much less functional. MAKE SURE YOU:  Understand these instructions.  Will watch your condition.  Will get help right away if you are not doing well or get worse. Document Released: 11/27/2005 Document Revised: 04/13/2014 Document Reviewed: 09/25/2013 [facility] Patient Information 2015 [place], [place]. This information is not intended to replace advice given to you by your health care provider. Make sure you discuss any questions you have with your health care provider.

## 2015-07-09 NOTE — Care Management (Signed)
Late entry- note omitted 7/28  Patient's wife declined skilled nursing and desired for patient to return home at discharge with home health.  Patient discharged home with new home 02 through [facility] and home nebulizer. Home Health SN PT OT and aide services.

## 2015-07-10 LAB — CULTURE, RESPIRATORY W GRAM STAIN

## 2015-07-10 LAB — CULTURE, RESPIRATORY: SPECIAL REQUESTS: NORMAL

## 2015-07-21 ENCOUNTER — Other Ambulatory Visit: Payer: Self-pay | Admitting: Family

## 2015-07-21 ENCOUNTER — Encounter: Payer: Self-pay | Admitting: Family

## 2015-07-21 ENCOUNTER — Ambulatory Visit: Payer: BLUE CROSS/BLUE SHIELD | Attending: Family | Admitting: Family

## 2015-07-21 ENCOUNTER — Telehealth: Payer: Self-pay | Admitting: Family

## 2015-07-21 VITALS — BP 127/84 | HR 66 | Resp 20 | Ht 72.0 in | Wt 190.0 lb

## 2015-07-21 DIAGNOSIS — Z87891 Personal history of nicotine dependence: Secondary | ICD-10-CM | POA: Insufficient documentation

## 2015-07-21 DIAGNOSIS — J449 Chronic obstructive pulmonary disease, unspecified: Secondary | ICD-10-CM | POA: Diagnosis not present

## 2015-07-21 DIAGNOSIS — I10 Essential (primary) hypertension: Secondary | ICD-10-CM | POA: Diagnosis not present

## 2015-07-21 DIAGNOSIS — I5032 Chronic diastolic (congestive) heart failure: Secondary | ICD-10-CM | POA: Insufficient documentation

## 2015-07-21 DIAGNOSIS — I4891 Unspecified atrial fibrillation: Secondary | ICD-10-CM | POA: Diagnosis not present

## 2015-07-21 DIAGNOSIS — Z86711 Personal history of pulmonary embolism: Secondary | ICD-10-CM | POA: Diagnosis not present

## 2015-07-21 DIAGNOSIS — K219 Gastro-esophageal reflux disease without esophagitis: Secondary | ICD-10-CM | POA: Insufficient documentation

## 2015-07-21 DIAGNOSIS — R0602 Shortness of breath: Secondary | ICD-10-CM | POA: Diagnosis not present

## 2015-07-21 DIAGNOSIS — Z79899 Other long term (current) drug therapy: Secondary | ICD-10-CM | POA: Diagnosis not present

## 2015-07-21 DIAGNOSIS — E119 Type 2 diabetes mellitus without complications: Secondary | ICD-10-CM | POA: Insufficient documentation

## 2015-07-21 DIAGNOSIS — E780 Pure hypercholesterolemia: Secondary | ICD-10-CM | POA: Insufficient documentation

## 2015-07-21 LAB — BASIC METABOLIC PANEL
ANION GAP: 11 (ref 5–15)
BUN: 18 mg/dL (ref 6–20)
CHLORIDE: 97 mmol/L — AB (ref 101–111)
CO2: 29 mmol/L (ref 22–32)
CREATININE: 1.5 mg/dL — AB (ref 0.61–1.24)
Calcium: 10.5 mg/dL — ABNORMAL HIGH (ref 8.9–10.3)
GFR calc Af Amer: 52 mL/min — ABNORMAL LOW (ref >60)
GFR calc non Af Amer: 44 mL/min — ABNORMAL LOW (ref >60)
GLUCOSE: 71 mg/dL (ref 65–99)
Potassium: 4.9 mmol/L (ref 3.5–5.1)
Sodium: 137 mmol/L (ref 135–145)

## 2015-07-21 MED ORDER — FUROSEMIDE 40 MG PO TABS: 40.0000 mg | ORAL_TABLET | Freq: Two times a day (BID) | ORAL | Status: DC

## 2015-07-21 NOTE — Progress Notes (Signed)
Subjective:    Patient ID: Austin [name], male    DOB: 1941-11-21, 74 y.o.   MRN: [id number]  Congestive Heart Failure Presents for initial visit. The disease course has been improving. Associated symptoms include edema, fatigue, palpitations ("sometimes") and shortness of breath. Pertinent negatives include no abdominal pain, chest pain, chest pressure or orthopnea. The symptoms have been improving. Past treatments include ACE inhibitors, salt and fluid restriction, oxygen and beta blockers. The treatment provided moderate relief. Compliance with prior treatments has been good. Past compliance problems: unable to read or write. His past medical history is significant for chronic lung disease, DM and HTN. Compliance with total regimen is 76-100%.    Past Medical History  Diagnosis Date  . COPD (chronic obstructive pulmonary disease)   . Gastric ulcer   . DVT (deep venous thrombosis)   . PE (pulmonary embolism)   . Hypertension   . Hypercholesteremia   . Diabetes mellitus without complication   . GERD (gastroesophageal reflux disease)   . Atrial fibrillation     Past Surgical History  Procedure Laterality Date  . Hernia repair    . Prostate surgery      Family History  Problem Relation Age of Onset  . CAD Brother   . Congestive Heart Failure Brother     Social History  Substance Use Topics  . Smoking status: Former Smoker -- 1.00 packs/day    Quit date: 07/04/2015  . Smokeless tobacco: Not on file  . Alcohol Use: No    Allergies  Allergen Reactions  . Contrast Media [Iodinated Diagnostic Agents] Nausea And Vomiting    Prior to Admission medications   Medication Sig Start Date End Date Taking? Authorizing Provider  albuterol (PROVENTIL) (2.5 MG/3ML) 0.083% nebulizer solution Take 3 mLs (2.5 mg total) by nebulization every 6 (six) hours as needed for wheezing or shortness of breath. 07/08/15  Yes Alford Highland, MD  albuterol-ipratropium (COMBIVENT) 18-103  MCG/ACT inhaler Inhale 2 puffs into the lungs 4 (four) times daily.   Yes Historical Provider, MD  budesonide-formoterol (SYMBICORT) 160-4.5 MCG/ACT inhaler Inhale 2 puffs into the lungs 2 (two) times daily.   Yes Historical Provider, MD  cefUROXime (CEFTIN) 500 MG tablet Take 1 tablet (500 mg total) by mouth 2 (two) times daily with a meal. 07/08/15  Yes Alford Highland, MD  furosemide (LASIX) 40 MG tablet Take 1 tablet (40 mg total) by mouth 2 (two) times daily. 07/21/15  Yes Delma Freeze, FNP  lisinopril (PRINIVIL,ZESTRIL) 40 MG tablet Take 40 mg by mouth daily.   Yes Historical Provider, MD  lovastatin (MEVACOR) 40 MG tablet Take 40 mg by mouth daily with supper. Pt should take with food.   Yes Historical Provider, MD  metFORMIN (GLUMETZA) 500 MG (MOD) 24 hr tablet Take 500 mg by mouth 2 (two) times daily with a meal.   Yes Historical Provider, MD  metoprolol tartrate (LOPRESSOR) 25 MG tablet Take 25 mg by mouth 2 (two) times daily.    Yes Historical Provider, MD  nicotine (NICODERM CQ - DOSED IN MG/24 HOURS) 21 mg/24hr patch Place 1 patch (21 mg total) onto the skin daily. 07/08/15  Yes Alford Highland, MD  nitroGLYCERIN (NITROSTAT) 0.4 MG SL tablet Place 0.4 mg under the tongue every 5 (five) minutes x 3 doses as needed for chest pain. If no relief call 911 or go to emergency room.   Yes Historical Provider, MD  omeprazole (PRILOSEC) 20 MG capsule Take 20 mg by mouth 2 (two)  times daily.   Yes Historical Provider, MD  potassium chloride SA (K-DUR,KLOR-CON) 20 MEQ tablet Take 1 tablet (20 mEq total) by mouth daily. 07/08/15  Yes Alford Highland, MD  predniSONE (DELTASONE) 5 MG tablet Take 1 tablet (5 mg total) by mouth daily with breakfast. 4 tabs day1; 3 tabs day2; 2 tabs day3; 1 tab day4,5 then stop 07/08/15  Yes Alford Highland, MD  sitaGLIPtin (JANUVIA) 100 MG tablet Take 100 mg by mouth daily.   Yes Historical Provider, MD  tiotropium (SPIRIVA) 18 MCG inhalation capsule Place 1 capsule (18 mcg  total) into inhaler and inhale daily. 07/08/15  Yes Alford Highland, MD     Review of Systems  Constitutional: Positive for fatigue. Negative for appetite change.  HENT: Positive for rhinorrhea. Negative for congestion and sore throat.   Eyes: Negative.   Respiratory: Positive for shortness of breath. Negative for cough, chest tightness and wheezing.   Cardiovascular: Positive for palpitations ("sometimes"). Negative for chest pain and leg swelling.  Gastrointestinal: Negative for abdominal pain and abdominal distention.  Endocrine: Negative.   Genitourinary: Negative.   Musculoskeletal: Negative for back pain and neck pain.  Skin: Negative.   Allergic/Immunologic: Negative.   Neurological: Negative for dizziness, light-headedness and headaches.  Hematological: Negative for adenopathy. Bruises/bleeds easily.  Psychiatric/Behavioral: Negative for sleep disturbance (sleeping on 2 pillows). The patient is not nervous/anxious.        Objective:   Physical Exam  Constitutional: He is oriented to person, place, and time. He appears well-developed and well-nourished.  HENT:  Head: Normocephalic and atraumatic.  Eyes: Conjunctivae are normal. Pupils are equal, round, and reactive to light.  Neck: Normal range of motion. Neck supple.  Cardiovascular: Normal rate and regular rhythm.   Pulmonary/Chest: Effort normal. He has no wheezes. He has rhonchi in the right upper field, the right lower field, the left upper field and the left lower field. He has no rales.  Abdominal: Soft. He exhibits no distension. There is no tenderness.  Musculoskeletal: He exhibits edema (2+ pitting edema in bilateral lower legs). He exhibits no tenderness.  Neurological: He is alert and oriented to person, place, and time.  Skin: Skin is warm and dry.  Psychiatric: He has a normal mood and affect. His behavior is normal. Thought content normal.  Nursing note and vitals reviewed.   BP 127/84 mmHg  Pulse 66   Resp 20  Ht 6' (1.829 m)  Wt 190 lb (86.183 kg)  BMI 25.76 kg/m2  SpO2 97%       Assessment & Plan:  1: Chronic heart failure with preserved ejection fraction- Patient presents with shortness of breath and fatigue with little exertion. He says that he did have symptoms walking from the scale to the exam room. Sometimes when he's getting dressed, he will have to sit down to rest. Once he sits down to rest, his symptoms resolve pretty quickly. He does wear oxygen at 2L around the clock which he says does help. He is already weighing himself daily and his weight chart shows a 5 pounds weight loss in the last 5 days. Discussed the importance of calling for an overnight weight gain of >2 pounds or a weekly weight gain of >5 pounds. He is not adding any salt to his food and uses Mrs. Dash for seasoning. His wife tends to cook with frozen food and will rinse off canned foods before cooking them. Written information was given to them about a 2000mg  sodium diet. A low sodium cookbook  was given to them. Is taking 40mg  furosemide twice daily per hospital instructions so a new prescription was sent in today. Will check labs today to evaluate renal function and potassium levels. Does have edema in bilateral lower legs and he admits that he doesn't elevate them like he should. Discussed the importance of elevating his legs when he's sitting down as well as to wear his TED hose daily with removing them at bedtime.  2: HTN- Blood pressure looks good today. Continue medications at this time. 3: COPD- Patient does have rhonchi throughout all lung fields. Is wearing oxgyen around the clock and using his nebulizer three times daily. Has not smoked since 07/07/15. Sees his PCP on 07/26/15. 4: Diabetes- Patient says that his glucose this morning was 98.   Return in 1 month or sooner for any questions/problems before the next office visit.

## 2015-07-21 NOTE — Telephone Encounter (Signed)
Spoke with granddaughter Solicitor) about patient's lab results. Potassium level is normal but renal function is a little worse from his hospital discharge back in July 2016. He has been taking furosemide  twice daily but she was instructed to have him start taking 1/2 tablet ( ) twice daily. Also advised her that should his weight go up >2 pounds overnight or >5 pounds in a week with the furosemide reduction, to call us back. Will plan on rechecking chemistry panel at his next visit in one month. Granddaughter verbalized understanding.

## 2015-07-21 NOTE — Patient Instructions (Signed)
Continue weighing daily and call for an overnight weight gain of > 2 pounds or a weekly weight gain of >5 pounds.  Low-Sodium Eating Plan Sodium raises blood pressure and causes water to be held in the body. Getting less sodium from food will help lower your blood pressure, reduce any swelling, and protect your heart, liver, and kidneys. We get sodium by adding salt (sodium chloride) to food. Most of our sodium comes from canned, boxed, and frozen foods. Restaurant foods, fast foods, and pizza are also very high in sodium. Even if you take medicine to lower your blood pressure or to reduce fluid in your body, getting less sodium from your food is important. WHAT IS MY PLAN? Most people should limit their sodium intake to 2,300 mg a day. Your health care provider recommends that you limit your sodium intake to __2000mg__ a day.  WHAT DO I NEED TO KNOW ABOUT THIS EATING PLAN? For the low-sodium eating plan, you will follow these general guidelines:  Choose foods with a % Daily Value for sodium of less than 5% (as listed on the food label).   Use salt-free seasonings or herbs instead of table salt or sea salt.   Check with your health care provider or pharmacist before using salt substitutes.   Eat fresh foods.  Eat more vegetables and fruits.  Limit canned vegetables. If you do use them, rinse them well to decrease the sodium.   Limit cheese to 1 oz (28 g) per day.   Eat lower-sodium products, often labeled as "lower sodium" or "no salt added."  Avoid foods that contain monosodium glutamate (MSG). MSG is sometimes added to Chinese food and some canned foods.  Check food labels (Nutrition Facts labels) on foods to learn how much sodium is in one serving.  Eat more home-cooked food and less restaurant, buffet, and fast food.  When eating at a restaurant, ask that your food be prepared with less salt or none, if possible.  HOW DO I READ FOOD LABELS FOR SODIUM INFORMATION? The  Nutrition Facts label lists the amount of sodium in one serving of the food. If you eat more than one serving, you must multiply the listed amount of sodium by the number of servings. Food labels may also identify foods as:  Sodium free--Less than 5 mg in a serving.  Very low sodium--35 mg or less in a serving.  Low sodium--140 mg or less in a serving.  Light in sodium--50% less sodium in a serving. For example, if a food that usually has 300 mg of sodium is changed to become light in sodium, it will have 150 mg of sodium.  Reduced sodium--25% less sodium in a serving. For example, if a food that usually has 400 mg of sodium is changed to reduced sodium, it will have 300 mg of sodium. WHAT FOODS CAN I EAT? Grains Low-sodium cereals, including oats, puffed wheat and rice, and shredded wheat cereals. Low-sodium crackers. Unsalted rice and pasta. Lower-sodium bread.  Vegetables Frozen or fresh vegetables. Low-sodium or reduced-sodium canned vegetables. Low-sodium or reduced-sodium tomato sauce and paste. Low-sodium or reduced-sodium tomato and vegetable juices.  Fruits Fresh, frozen, and canned fruit. Fruit juice.  Meat and Other Protein Products Low-sodium canned tuna and salmon. Fresh or frozen meat, poultry, seafood, and fish. Lamb. Unsalted nuts. Dried beans, peas, and lentils without added salt. Unsalted canned beans. Homemade soups without salt. Eggs.  Dairy Milk. Soy milk. Ricotta cheese. Low-sodium or reduced-sodium cheeses. Yogurt.  Condiments Fresh   and dried herbs and spices. Salt-free seasonings. Onion and garlic powders. Low-sodium varieties of mustard and ketchup. Lemon juice.  Fats and Oils Reduced-sodium salad dressings. Unsalted butter.  Other Unsalted popcorn and pretzels.  The items listed above may not be a complete list of recommended foods or beverages. Contact your dietitian for more options. WHAT FOODS ARE NOT RECOMMENDED? Grains Instant hot cereals.  Bread stuffing, pancake, and biscuit mixes. Croutons. Seasoned rice or pasta mixes. Noodle soup cups. Boxed or frozen macaroni and cheese. Self-rising flour. Regular salted crackers. Vegetables Regular canned vegetables. Regular canned tomato sauce and paste. Regular tomato and vegetable juices. Frozen vegetables in sauces. Salted french fries. Olives. Pickles. Relishes. Sauerkraut. Salsa. Meat and Other Protein Products Salted, canned, smoked, spiced, or pickled meats, seafood, or fish. Bacon, ham, sausage, hot dogs, corned beef, chipped beef, and packaged luncheon meats. Salt pork. Jerky. Pickled herring. Anchovies, regular canned tuna, and sardines. Salted nuts. Dairy Processed cheese and cheese spreads. Cheese curds. Blue cheese and cottage cheese. Buttermilk.  Condiments Onion and garlic salt, seasoned salt, table salt, and sea salt. Canned and packaged gravies. Worcestershire sauce. Tartar sauce. Barbecue sauce. Teriyaki sauce. Soy sauce, including reduced sodium. Steak sauce. Fish sauce. Oyster sauce. Cocktail sauce. Horseradish. Regular ketchup and mustard. Meat flavorings and tenderizers. Bouillon cubes. Hot sauce. Tabasco sauce. Marinades. Taco seasonings. Relishes. Fats and Oils Regular salad dressings. Salted butter. Margarine. Ghee. Bacon fat.  Other Potato and tortilla chips. Corn chips and puffs. Salted popcorn and pretzels. Canned or dried soups. Pizza. Frozen entrees and pot pies.  The items listed above may not be a complete list of foods and beverages to avoid. Contact your dietitian for more information. Document Released: 05/19/2002 Document Revised: 12/02/2013 Document Reviewed: 10/01/2013 [employer] Patient Information 2015 [employer]. This information is not intended to replace advice given to you by your health care provider. Make sure you discuss any questions you have with your health care provider.  

## 2015-08-15 ENCOUNTER — Emergency Department: Payer: BLUE CROSS/BLUE SHIELD

## 2015-08-15 ENCOUNTER — Encounter: Payer: Self-pay | Admitting: Medical Oncology

## 2015-08-15 ENCOUNTER — Observation Stay
Admission: EM | Admit: 2015-08-15 | Discharge: 2015-08-17 | Disposition: A | Discharge status: Home / Self Care | Payer: BLUE CROSS/BLUE SHIELD | Attending: Internal Medicine | Admitting: Internal Medicine

## 2015-08-15 DIAGNOSIS — I10 Essential (primary) hypertension: Secondary | ICD-10-CM | POA: Insufficient documentation

## 2015-08-15 DIAGNOSIS — J449 Chronic obstructive pulmonary disease, unspecified: Secondary | ICD-10-CM | POA: Insufficient documentation

## 2015-08-15 DIAGNOSIS — R079 Chest pain, unspecified: Secondary | ICD-10-CM | POA: Diagnosis present

## 2015-08-15 DIAGNOSIS — I4891 Unspecified atrial fibrillation: Secondary | ICD-10-CM | POA: Insufficient documentation

## 2015-08-15 DIAGNOSIS — Z86718 Personal history of other venous thrombosis and embolism: Secondary | ICD-10-CM | POA: Diagnosis not present

## 2015-08-15 DIAGNOSIS — F17210 Nicotine dependence, cigarettes, uncomplicated: Secondary | ICD-10-CM | POA: Diagnosis not present

## 2015-08-15 DIAGNOSIS — I5032 Chronic diastolic (congestive) heart failure: Secondary | ICD-10-CM | POA: Insufficient documentation

## 2015-08-15 DIAGNOSIS — E119 Type 2 diabetes mellitus without complications: Secondary | ICD-10-CM

## 2015-08-15 DIAGNOSIS — K219 Gastro-esophageal reflux disease without esophagitis: Secondary | ICD-10-CM | POA: Diagnosis not present

## 2015-08-15 DIAGNOSIS — N289 Disorder of kidney and ureter, unspecified: Secondary | ICD-10-CM | POA: Insufficient documentation

## 2015-08-15 DIAGNOSIS — R778 Other specified abnormalities of plasma proteins: Secondary | ICD-10-CM

## 2015-08-15 DIAGNOSIS — E785 Hyperlipidemia, unspecified: Secondary | ICD-10-CM | POA: Insufficient documentation

## 2015-08-15 DIAGNOSIS — R7989 Other specified abnormal findings of blood chemistry: Secondary | ICD-10-CM | POA: Diagnosis present

## 2015-08-15 DIAGNOSIS — R0789 Other chest pain: Principal | ICD-10-CM | POA: Insufficient documentation

## 2015-08-15 DIAGNOSIS — Z8719 Personal history of other diseases of the digestive system: Secondary | ICD-10-CM | POA: Insufficient documentation

## 2015-08-15 DIAGNOSIS — R0602 Shortness of breath: Secondary | ICD-10-CM | POA: Diagnosis not present

## 2015-08-15 DIAGNOSIS — Z79899 Other long term (current) drug therapy: Secondary | ICD-10-CM | POA: Diagnosis not present

## 2015-08-15 DIAGNOSIS — I5030 Unspecified diastolic (congestive) heart failure: Secondary | ICD-10-CM

## 2015-08-15 DIAGNOSIS — K5900 Constipation, unspecified: Secondary | ICD-10-CM | POA: Insufficient documentation

## 2015-08-15 DIAGNOSIS — Z92241 Personal history of systemic steroid therapy: Secondary | ICD-10-CM | POA: Diagnosis not present

## 2015-08-15 LAB — GLUCOSE, CAPILLARY: GLUCOSE-CAPILLARY: 63 mg/dL — AB (ref 65–99)

## 2015-08-15 MED ORDER — DOCUSATE SODIUM 100 MG PO CAPS
100.0000 mg | ORAL_CAPSULE | Freq: Once | ORAL | Status: AC
  Administered 2015-08-15: 100 mg via ORAL
  Filled 2015-08-15: qty 1

## 2015-08-15 MED ORDER — ONDANSETRON 4 MG PO TBDP
4.0000 mg | ORAL_TABLET | Freq: Once | ORAL | Status: AC | PRN
  Administered 2015-08-15: 4 mg via ORAL
  Filled 2015-08-15: qty 1

## 2015-08-15 MED ORDER — MAGNESIUM CITRATE PO SOLN
0.5000 | Freq: Once | ORAL | Status: AC
  Administered 2015-08-15: 0.5 via ORAL
  Filled 2015-08-15: qty 296

## 2015-08-15 NOTE — ED Notes (Signed)
Pt reports that he has not had BM in 3-4 days. Denies n/v. Denies other sx's.

## 2015-08-15 NOTE — ED Provider Notes (Signed)
[facility] Emergency Department Provider Note  ____________________________________________  Time seen: Approximately 9:44 PM  I have reviewed the triage vital signs and the nursing notes.   HISTORY  Chief Complaint Constipation    HPI Austin [name] is a 74 y.o. male with a history of multiple chronic medical problems who presents with constipation for about 4 days.  He has had multiple recent medication changes for his diabetes but has not been taking pain medication.  He denies N/V, just states he has not been able to have a bowel movement for four days. He said it hurts when he tries to go.  He also said he was able to "reach up in there" and pull out some hard stool yesterday, but not much came out.    In addition to the above, he also denies fever/chills, chest pain, shortness of breath, and abdominal pain except for the pain "around my butt hole" when he tries to have a bowel movement.     Past Medical History  Diagnosis Date  . COPD (chronic obstructive pulmonary disease)   . Gastric ulcer   . DVT (deep venous thrombosis)   . PE (pulmonary embolism)   . Hypertension   . Hypercholesteremia   . Diabetes mellitus without complication   . GERD (gastroesophageal reflux disease)   . Atrial fibrillation     Patient Active Problem List   Diagnosis Date Noted  . HTN (hypertension) 07/21/2015  . COPD (chronic obstructive pulmonary disease) with chronic bronchitis 07/21/2015  . CHF (NYHA class IV, ACC/AHA stage D) 07/04/2015  . CHF (congestive heart failure) 07/04/2015    Past Surgical History  Procedure Laterality Date  . Hernia repair    . Prostate surgery      Current Outpatient Rx  Name  Route  Sig  Dispense  Refill  . albuterol (PROVENTIL) (2.5 MG/3ML) 0.083% nebulizer solution   Nebulization   Take 3 mLs (2.5 mg total) by nebulization every 6 (six) hours as needed for wheezing or shortness of breath.   75 mL   12   .  albuterol-ipratropium (COMBIVENT) 18-103 MCG/ACT inhaler   Inhalation   Inhale 2 puffs into the lungs 4 (four) times daily.         . budesonide-formoterol (SYMBICORT) 160-4.5 MCG/ACT inhaler   Inhalation   Inhale 2 puffs into the lungs 2 (two) times daily.         . cefUROXime (CEFTIN) 500 MG tablet   Oral   Take 1 tablet (500 mg total) by mouth 2 (two) times daily with a meal.   16 tablet   0   . furosemide (LASIX) 40 MG tablet   Oral   Take 1 tablet (40 mg total) by mouth 2 (two) times daily.   60 tablet   5   . lisinopril (PRINIVIL,ZESTRIL) 40 MG tablet   Oral   Take 40 mg by mouth daily.         Marland Kitchen lovastatin (MEVACOR) 40 MG tablet   Oral   Take 40 mg by mouth daily with supper. Pt should take with food.         . metFORMIN (GLUMETZA) 500 MG (MOD) 24 hr tablet   Oral   Take 500 mg by mouth 2 (two) times daily with a meal.         . metoprolol tartrate (LOPRESSOR) 25 MG tablet   Oral   Take 25 mg by mouth 2 (two) times daily.          Marland Kitchen  nicotine (NICODERM CQ - DOSED IN MG/24 HOURS) 21 mg/24hr patch   Transdermal   Place 1 patch (21 mg total) onto the skin daily.   28 patch   0   . nitroGLYCERIN (NITROSTAT) 0.4 MG SL tablet   Sublingual   Place 0.4 mg under the tongue every 5 (five) minutes x 3 doses as needed for chest pain. If no relief call 911 or go to emergency room.         Marland Kitchen omeprazole (PRILOSEC) 20 MG capsule   Oral   Take 20 mg by mouth 2 (two) times daily.         . potassium chloride SA (K-DUR,KLOR-CON) 20 MEQ tablet   Oral   Take 1 tablet (20 mEq total) by mouth daily.   30 tablet   0   . predniSONE (DELTASONE) 5 MG tablet   Oral   Take 1 tablet (5 mg total) by mouth daily with breakfast. 4 tabs day1; 3 tabs day2; 2 tabs day3; 1 tab day4,5 then stop   11 tablet   0   . sitaGLIPtin (JANUVIA) 100 MG tablet   Oral   Take 100 mg by mouth daily.         Marland Kitchen tiotropium (SPIRIVA) 18 MCG inhalation capsule   Inhalation   Place  1 capsule (18 mcg total) into inhaler and inhale daily.   30 capsule   0     Allergies Contrast media  Family History  Problem Relation Age of Onset  . CAD Brother   . Congestive Heart Failure Brother     Social History Social History  Substance Use Topics  . Smoking status: Former Smoker -- 1.00 packs/day    Quit date: 07/04/2015  . Smokeless tobacco: None  . Alcohol Use: No    Review of Systems Constitutional: No fever/chills Eyes: No visual changes. ENT: No sore throat. Cardiovascular: Denies chest pain. Respiratory: Denies shortness of breath. Gastrointestinal: No abdominal pain.  No nausea, no vomiting.  No diarrhea.  Rectal pain when trying to have a bowel movement. Genitourinary: Negative for dysuria. Musculoskeletal: Negative for back pain. Skin: Negative for rash. Neurological: Negative for headaches, focal weakness or numbness.  10-point ROS otherwise negative.  ____________________________________________   PHYSICAL EXAM:  VITAL SIGNS: ED Triage Vitals  Enc Vitals Group     BP 08/15/15 1724 134/81 mmHg     Pulse Rate 08/15/15 1724 77     Resp 08/15/15 1724 20     Temp 08/15/15 1724 97.5 F (36.4 C)     Temp Source 08/15/15 1724 Oral     SpO2 08/15/15 1724 97 %     Weight 08/15/15 1724 193 lb (87.544 kg)     Height 08/15/15 1724  (1.778 m)     Head Cir --      Peak Flow --      Pain Score 08/15/15 1725 10     Pain Loc --      Pain Edu? --      Excl. in GC? --     Constitutional: Alert.  Ambulating to and from the toilet.  No acute distress. Eyes: Conjunctivae are normal. PERRL. EOMI. Mild scleral icterus Head: Atraumatic. Nose: No congestion/rhinnorhea. Mouth/Throat: Mucous membranes are moist.  Oropharynx non-erythematous.  Poor dentition. Neck: No stridor.   Cardiovascular: Normal rate, regular rhythm. Grossly normal heart sounds.  Good peripheral circulation. Respiratory: Normal respiratory effort.  No retractions. Lungs  CTAB. Gastrointestinal: Soft and nontender. No distention of  the lower abdomen feels "full". No abdominal bruits. No CVA tenderness.  Chronic abdominal hernia, soft, easily reducible, nontender. Musculoskeletal: No lower extremity tenderness nor edema.  No joint effusions. Neurologic:  Normal speech and language. No gross focal neurologic deficits are appreciated.  Skin:  Skin is warm, dry and intact. No rash noted. Psychiatric: Mood and affect are normal. Speech and behavior are normal.  ____________________________________________   LABS (all labs ordered are listed, but only abnormal results are displayed)  Labs Reviewed  GLUCOSE, CAPILLARY - Abnormal; Notable for the following:    Glucose-Capillary 63 (*)    All other components within normal limits  CBC WITH DIFFERENTIAL/PLATELET  COMPREHENSIVE METABOLIC PANEL  LIPASE, BLOOD  TROPONIN I  CBG MONITORING, ED   ____________________________________________  EKG  Ordered ____________________________________________  RADIOLOGY   Dg Abd 1 View  08/15/2015   CLINICAL DATA:  74 year old male with without bowel movement in 4 days. Initial encounter.  EXAM: ABDOMEN - 1 VIEW  COMPARISON:  CT Abdomen and Pelvis 04/11/2015.  FINDINGS: 2 supine views of the abdomen and pelvis. Lung bases appear negative. Cardiomegaly again noted. Stable cholecystectomy clips. Non obstructed bowel gas pattern. Retained stool throughout the colon. Evidence of left colon diverticulosis. No acute osseous abnormality identified. No definite pneumoperitoneum on these supine views.  IMPRESSION: Non obstructed bowel gas pattern with retained stool throughout the colon.   Electronically Signed   By: Odessa Fleming M.D.   On: 08/15/2015 18:07    ____________________________________________   PROCEDURES  Procedure(s) performed: None  Critical Care performed: No ____________________________________________   INITIAL IMPRESSION / ASSESSMENT AND PLAN / ED  COURSE  Pertinent labs & imaging results that were available during my care of the patient were reviewed by me and considered in my medical decision making (see chart for details).  The patient's vital signs are stable and he is in no acute distress and has no abdominal tenderness to palpation.  He has a history strongly suggestive of constipation and a 1 view abdominal x-ray that shows a large retained stool burden.  I discussed with the patient as wife the plan of care.  We decided to try a soapsuds enema (a "pink elephant") to see if this helps him.  Given all his medications or chronic medical problems, I will also send a basic metabolic panel given that the hypokalemia can be a cause of constipation, and he does take Lasix.  ----------------------------------------- 11:23 PM on 08/15/2015 -----------------------------------------  Patient feels much better after large volume stool (multiple) after digital disimpaction by nurse and after pink elephant enema.  However, given his age and co-morbidities, I will basic and abdominal labs, as well as a troponin and an ECG.  Transferring care to Dr. Dolores Frame to follow up, but I already spoke with the patient and family and they anticipate discharge if the lab workup is normal.  ____________________________________________  FINAL CLINICAL IMPRESSION(S) / ED DIAGNOSES  Final diagnoses:  Constipation, unspecified constipation type      NEW MEDICATIONS STARTED DURING THIS VISIT:  New Prescriptions   No medications on file     Loleta Rose, MD 08/15/15 2330

## 2015-08-15 NOTE — ED Notes (Signed)
Patient transported to X-ray 

## 2015-08-15 NOTE — ED Notes (Signed)
Helped pt. From bed to [facility]

## 2015-08-15 NOTE — ED Notes (Signed)
Family reported pt diabetic and had not eaten for some time but was experiencing nausea. CBG checked and hypoglycemia and nausea treated per protocol.

## 2015-08-16 DIAGNOSIS — K219 Gastro-esophageal reflux disease without esophagitis: Secondary | ICD-10-CM | POA: Diagnosis present

## 2015-08-16 DIAGNOSIS — I5030 Unspecified diastolic (congestive) heart failure: Secondary | ICD-10-CM

## 2015-08-16 DIAGNOSIS — E785 Hyperlipidemia, unspecified: Secondary | ICD-10-CM | POA: Diagnosis present

## 2015-08-16 DIAGNOSIS — K5900 Constipation, unspecified: Secondary | ICD-10-CM | POA: Diagnosis present

## 2015-08-16 DIAGNOSIS — E119 Type 2 diabetes mellitus without complications: Secondary | ICD-10-CM

## 2015-08-16 DIAGNOSIS — R079 Chest pain, unspecified: Secondary | ICD-10-CM | POA: Diagnosis present

## 2015-08-16 LAB — CBC WITH DIFFERENTIAL/PLATELET
BASOS ABS: 0.1 10*3/uL (ref 0–0.1)
BASOS PCT: 1 %
EOS PCT: 2 %
Eosinophils Absolute: 0.1 10*3/uL (ref 0–0.7)
HEMATOCRIT: 44.7 % (ref 40.0–52.0)
Hemoglobin: 14.3 g/dL (ref 13.0–18.0)
Lymphocytes Relative: 13 %
Lymphs Abs: 0.9 10*3/uL — ABNORMAL LOW (ref 1.0–3.6)
MCH: 30.3 pg (ref 26.0–34.0)
MCHC: 32 g/dL (ref 32.0–36.0)
MCV: 94.7 fL (ref 80.0–100.0)
MONO ABS: 0.8 10*3/uL (ref 0.2–1.0)
Monocytes Relative: 11 %
NEUTROS ABS: 5 10*3/uL (ref 1.4–6.5)
Neutrophils Relative %: 73 %
PLATELETS: 186 10*3/uL (ref 150–440)
RBC: 4.72 MIL/uL (ref 4.40–5.90)
RDW: 16.2 % — AB (ref 11.5–14.5)
WBC: 6.9 10*3/uL (ref 3.8–10.6)

## 2015-08-16 LAB — TROPONIN I
TROPONIN I: 0.05 ng/mL — AB (ref <0.031)
TROPONIN I: 0.05 ng/mL — AB (ref <0.031)
TROPONIN I: 0.05 ng/mL — AB (ref <0.031)
Troponin I: 0.05 ng/mL — ABNORMAL HIGH (ref <0.031)
Troponin I: 0.05 ng/mL — ABNORMAL HIGH (ref <0.031)

## 2015-08-16 LAB — COMPREHENSIVE METABOLIC PANEL
ALBUMIN: 4 g/dL (ref 3.5–5.0)
ALT: 10 U/L — ABNORMAL LOW (ref 17–63)
AST: 21 U/L (ref 15–41)
Alkaline Phosphatase: 84 U/L (ref 38–126)
Anion gap: 7 (ref 5–15)
BUN: 24 mg/dL — AB (ref 6–20)
CHLORIDE: 104 mmol/L (ref 101–111)
CO2: 25 mmol/L (ref 22–32)
Calcium: 10.7 mg/dL — ABNORMAL HIGH (ref 8.9–10.3)
Creatinine, Ser: 1.38 mg/dL — ABNORMAL HIGH (ref 0.61–1.24)
GFR calc Af Amer: 57 mL/min — ABNORMAL LOW (ref >60)
GFR calc non Af Amer: 49 mL/min — ABNORMAL LOW (ref >60)
GLUCOSE: 131 mg/dL — AB (ref 65–99)
POTASSIUM: 4.3 mmol/L (ref 3.5–5.1)
Sodium: 136 mmol/L (ref 135–145)
Total Bilirubin: 1.2 mg/dL (ref 0.3–1.2)
Total Protein: 6.9 g/dL (ref 6.5–8.1)

## 2015-08-16 LAB — LIPASE, BLOOD: Lipase: 20 U/L — ABNORMAL LOW (ref 22–51)

## 2015-08-16 LAB — GLUCOSE, CAPILLARY
GLUCOSE-CAPILLARY: 124 mg/dL — AB (ref 65–99)
Glucose-Capillary: 114 mg/dL — ABNORMAL HIGH (ref 65–99)
Glucose-Capillary: 131 mg/dL — ABNORMAL HIGH (ref 65–99)
Glucose-Capillary: 95 mg/dL (ref 65–99)

## 2015-08-16 MED ORDER — PANTOPRAZOLE SODIUM 40 MG PO TBEC
40.0000 mg | DELAYED_RELEASE_TABLET | Freq: Two times a day (BID) | ORAL | Status: DC
  Administered 2015-08-16 – 2015-08-17 (×4): 40 mg via ORAL
  Filled 2015-08-16 (×4): qty 1

## 2015-08-16 MED ORDER — SODIUM CHLORIDE 0.9 % IJ SOLN
3.0000 mL | Freq: Two times a day (BID) | INTRAMUSCULAR | Status: DC
  Administered 2015-08-16 – 2015-08-17 (×3): 3 mL via INTRAVENOUS

## 2015-08-16 MED ORDER — INSULIN ASPART 100 UNIT/ML ~~LOC~~ SOLN
0.0000 [IU] | Freq: Three times a day (TID) | SUBCUTANEOUS | Status: DC
  Administered 2015-08-16 (×2): 1 [IU] via SUBCUTANEOUS
  Administered 2015-08-17: 2 [IU] via SUBCUTANEOUS
  Filled 2015-08-16: qty 2
  Filled 2015-08-16 (×2): qty 1

## 2015-08-16 MED ORDER — LISINOPRIL 20 MG PO TABS
40.0000 mg | ORAL_TABLET | Freq: Every day | ORAL | Status: DC
  Administered 2015-08-16 – 2015-08-17 (×2): 40 mg via ORAL
  Filled 2015-08-16 (×2): qty 2

## 2015-08-16 MED ORDER — METOPROLOL TARTRATE 25 MG PO TABS
12.5000 mg | ORAL_TABLET | Freq: Two times a day (BID) | ORAL | Status: DC
  Administered 2015-08-16 – 2015-08-17 (×3): 12.5 mg via ORAL
  Filled 2015-08-16 (×3): qty 1

## 2015-08-16 MED ORDER — FUROSEMIDE 20 MG PO TABS
20.0000 mg | ORAL_TABLET | Freq: Two times a day (BID) | ORAL | Status: DC
  Administered 2015-08-16 – 2015-08-17 (×4): 20 mg via ORAL
  Filled 2015-08-16 (×4): qty 1

## 2015-08-16 MED ORDER — NITROGLYCERIN 0.4 MG SL SUBL: 0.4000 mg | SUBLINGUAL_TABLET | SUBLINGUAL | Status: DC | PRN

## 2015-08-16 MED ORDER — ONDANSETRON HCL 4 MG/2ML IJ SOLN: 4.0000 mg | Freq: Four times a day (QID) | INTRAMUSCULAR | Status: DC | PRN

## 2015-08-16 MED ORDER — PRAVASTATIN SODIUM 20 MG PO TABS
40.0000 mg | ORAL_TABLET | Freq: Every day | ORAL | Status: DC
  Administered 2015-08-16 – 2015-08-17 (×2): 40 mg via ORAL
  Filled 2015-08-16 (×2): qty 2

## 2015-08-16 MED ORDER — HEPARIN SODIUM (PORCINE) 5000 UNIT/ML IJ SOLN
5000.0000 [IU] | Freq: Three times a day (TID) | INTRAMUSCULAR | Status: DC
  Administered 2015-08-16 – 2015-08-17 (×5): 5000 [IU] via SUBCUTANEOUS
  Filled 2015-08-16 (×5): qty 1

## 2015-08-16 MED ORDER — DOCUSATE SODIUM 100 MG PO CAPS
100.0000 mg | ORAL_CAPSULE | Freq: Two times a day (BID) | ORAL | Status: DC
  Administered 2015-08-16 – 2015-08-17 (×3): 100 mg via ORAL
  Filled 2015-08-16 (×3): qty 1

## 2015-08-16 MED ORDER — ACETAMINOPHEN 650 MG RE SUPP: 650.0000 mg | Freq: Four times a day (QID) | RECTAL | Status: DC | PRN

## 2015-08-16 MED ORDER — ACETAMINOPHEN 325 MG PO TABS: 650.0000 mg | ORAL_TABLET | Freq: Four times a day (QID) | ORAL | Status: DC | PRN

## 2015-08-16 MED ORDER — ONDANSETRON HCL 4 MG PO TABS
4.0000 mg | ORAL_TABLET | Freq: Four times a day (QID) | ORAL | Status: DC | PRN
Start: 2015-08-16 — End: 2015-08-17

## 2015-08-16 MED ORDER — INSULIN ASPART 100 UNIT/ML ~~LOC~~ SOLN: 0.0000 [IU] | Freq: Every day | SUBCUTANEOUS | Status: DC

## 2015-08-16 MED ORDER — ASPIRIN 81 MG PO CHEW
324.0000 mg | CHEWABLE_TABLET | Freq: Once | ORAL | Status: AC
  Administered 2015-08-16: 324 mg via ORAL
  Filled 2015-08-16: qty 4

## 2015-08-16 MED ORDER — NITROGLYCERIN 2 % TD OINT
1.0000 [in_us] | TOPICAL_OINTMENT | Freq: Once | TRANSDERMAL | Status: AC
Start: 2015-08-16 — End: 2015-08-16
  Administered 2015-08-16: 1 [in_us] via TOPICAL
  Filled 2015-08-16: qty 1

## 2015-08-16 MED ORDER — SODIUM CHLORIDE 0.9 % IV BOLUS (SEPSIS)
250.0000 mL | Freq: Once | INTRAVENOUS | Status: AC
  Administered 2015-08-16: 250 mL via INTRAVENOUS

## 2015-08-16 NOTE — Consult Note (Signed)
[facility] Cardiology  CARDIOLOGY CONSULT NOTE  Patient ID: Austin [name] MRN: [id number] DOB/AGE: 73/12/1940 73 y.o.  Admit date: 08/15/2015 Referring Physician Nicki Reaper MD Primary Physician [facility] Primary Cardiologist Dorothyann Peng M.D. Reason for Consultation borderline elevated troponin  HPI: The patient is a 74 year old gentleman who presented to [facility] emergency room with chief complaint of constipation. Patient underwent disimpaction and enema with removal of large amount of stool. Initial lab work revealed borderline elevated troponin of 0.05. Follow-up troponin was 0.05. The patient reports a history of intermittent episodes of chest discomfort, short induration typically lasting less than a minute. These episodes appear to occur with or without exertion. The patient has known chronic diastolic congestive heart failure, COPD and ongoing tobacco abuse with exertional dyspnea. ECG revealed sinus rhythm with nonspecific T-wave abnormalities inferiorly. Patient is followed by Dr. Dorothyann Peng for chronic diastolic congestive heart failure and atrial fibrillation.  Review of systems complete and found to be negative unless listed above     Past Medical History  Diagnosis Date  . COPD (chronic obstructive pulmonary disease)   . Gastric ulcer   . DVT (deep venous thrombosis)   . PE (pulmonary embolism)   . Hypertension   . Hypercholesteremia   . Diabetes mellitus without complication   . GERD (gastroesophageal reflux disease)   . Atrial fibrillation     Past Surgical History  Procedure Laterality Date  . Hernia repair    . Prostate surgery      Prescriptions prior to admission  Medication Sig Dispense Refill Last Dose  . furosemide (LASIX) 40 MG tablet Take 1 tablet (40 mg total) by mouth 2 (two) times daily. (Patient taking differently: Take 20 mg by mouth 2 (two) times daily. ) 60 tablet 5 08/15/2015 at Unknown time  . glipiZIDE (GLUCOTROL) 5 MG tablet  Take 5 mg by mouth 2 (two) times daily before a meal.   08/15/2015 at Unknown time  . lisinopril (PRINIVIL,ZESTRIL) 40 MG tablet Take 40 mg by mouth daily.   08/15/2015 at Unknown time  . lovastatin (MEVACOR) 40 MG tablet Take 40 mg by mouth daily with supper. Pt should take with food.   08/15/2015 at Unknown time  . metoprolol tartrate (LOPRESSOR) 25 MG tablet Take 12.5 mg by mouth 2 (two) times daily.    08/15/2015 at Unknown time  . omeprazole (PRILOSEC) 20 MG capsule Take 20 mg by mouth 2 (two) times daily.   08/15/2015 at Unknown time  . albuterol (PROVENTIL) (2.5 MG/3ML) 0.083% nebulizer solution Take 3 mLs (2.5 mg total) by nebulization every 6 (six) hours as needed for wheezing or shortness of breath. 75 mL 12 Taking  . albuterol-ipratropium (COMBIVENT) 18-103 MCG/ACT inhaler Inhale 2 puffs into the lungs 4 (four) times daily.   Taking  . budesonide-formoterol (SYMBICORT) 160-4.5 MCG/ACT inhaler Inhale 2 puffs into the lungs 2 (two) times daily.   Taking  . cefUROXime (CEFTIN) 500 MG tablet Take 1 tablet (500 mg total) by mouth 2 (two) times daily with a meal. (Patient not taking: Reported on 08/16/2015) 16 tablet 0 Completed Course at Unknown time  . metFORMIN (GLUMETZA) 500 MG (MOD) 24 hr tablet Take 500 mg by mouth 2 (two) times daily with a meal.   Taking  . nicotine (NICODERM CQ - DOSED IN MG/24 HOURS) 21 mg/24hr patch Place 1 patch (21 mg total) onto the skin daily. 28 patch 0 Taking  . nitroGLYCERIN (NITROSTAT) 0.4 MG SL tablet Place 0.4 mg under the  tongue every 5 (five) minutes x 3 doses as needed for chest pain. If no relief call 911 or go to emergency room.   PRN at PRN  . potassium chloride SA (K-DUR,KLOR-CON) 20 MEQ tablet Take 1 tablet (20 mEq total) by mouth daily. 30 tablet 0 Taking  . predniSONE (DELTASONE) 5 MG tablet Take 1 tablet (5 mg total) by mouth daily with breakfast. 4 tabs day1; 3 tabs day2; 2 tabs day3; 1 tab day4,5 then stop 11 tablet 0 Taking  . tiotropium (SPIRIVA) 18 MCG  inhalation capsule Place 1 capsule (18 mcg total) into inhaler and inhale daily. 30 capsule 0 Taking   Social History   Social History  . Marital Status: Married    Spouse Name: N/A  . Number of Children: N/A  . Years of Education: N/A   Occupational History  . Not on file.   Social History Main Topics  . Smoking status: Former Smoker -- 1.00 packs/day    Quit date: 07/04/2015  . Smokeless tobacco: Not on file  . Alcohol Use: No  . Drug Use: No  . Sexual Activity: Not on file   Other Topics Concern  . Not on file   Social History Narrative    Family History  Problem Relation Age of Onset  . CAD Brother   . Congestive Heart Failure Brother       Review of systems complete and found to be negative unless listed above      PHYSICAL EXAM  General: Well developed, well nourished, in no acute distress HEENT:  Normocephalic and atramatic Neck:  No JVD.  Lungs: Clear bilaterally to auscultation and percussion. Heart: HRRR . Normal S1 and S2 without gallops or murmurs.  Abdomen: Bowel sounds are positive, abdomen soft and non-tender  Msk:  Back normal, normal gait. Normal strength and tone for age. Extremities: No clubbing, cyanosis or edema.   Neuro: Alert and oriented X 3. Psych:  Good affect, responds appropriately  Labs:   Lab Results  Component Value Date   WBC 6.9 08/15/2015   HGB 14.3 08/15/2015   HCT 44.7 08/15/2015   MCV 94.7 08/15/2015   PLT 186 08/15/2015    Recent Labs Lab 08/15/15 2345  NA 136  K 4.3  CL 104  CO2 25  BUN 24*  CREATININE 1.38*  CALCIUM 10.7*  PROT 6.9  BILITOT 1.2  ALKPHOS 84  ALT 10*  AST 21  GLUCOSE 131*   Lab Results  Component Value Date   TROPONINI 0.05* 08/16/2015    Lab Results  Component Value Date   CHOL 130 03/23/2014   Lab Results  Component Value Date   HDL 34* 03/23/2014   Lab Results  Component Value Date   LDLCALC 74 03/23/2014   Lab Results  Component Value Date   TRIG 111 03/23/2014    No results found for: CHOLHDL No results found for: LDLDIRECT    Radiology: Dg Abd 1 View  08/15/2015   CLINICAL DATA:  74 year old male with without bowel movement in 4 days. Initial encounter.  EXAM: ABDOMEN - 1 VIEW  COMPARISON:  CT Abdomen and Pelvis 04/11/2015.  FINDINGS: 2 supine views of the abdomen and pelvis. Lung bases appear negative. Cardiomegaly again noted. Stable cholecystectomy clips. Non obstructed bowel gas pattern. Retained stool throughout the colon. Evidence of left colon diverticulosis. No acute osseous abnormality identified. No definite pneumoperitoneum on these supine views.  IMPRESSION: Non obstructed bowel gas pattern with retained stool throughout the colon.  Electronically Signed   By: Odessa Fleming M.D.   On: 08/15/2015 18:07    EKG: Normal sinus rhythm with nonspecific T-wave abnormalities inferiorly  ASSESSMENT AND PLAN:   1. Borderline elevated troponin, without peak or trough, in the setting of fecal impaction, diastolic congestive heart failure, COPD and chronic kidney disease. I suspect elevated troponin is due to demand supply ischemia and not due to acute coronary syndrome. 2. Intermittent chest pain, with atypical features 3. Chronic diastolic congestive heart failure, currently appears clinically stable 4. COPD, with ongoing tobacco abuse  Recommendations  1. Agree with current therapy 2. Defer full dose anticoagulation at this time 3. Defer cardiac catheterization 4. Consider Lexiscan sestamibi study which could be done as outpatient  Signed: Aracelia Brinson MD,PhD, [facility] 08/16/2015, 9:43 AM

## 2015-08-16 NOTE — Progress Notes (Signed)
[facility] [place] [facility]   PATIENT NAME: Austin [name]    MR#:  [id number]  DATE OF BIRTH:  Sep 22, 1941  SUBJECTIVE:  CHIEF COMPLAINT:   Chief Complaint  Patient presents with  . Constipation   still continues to have intermittent chest pressure and abdominal discomfort REVIEW OF SYSTEMS:  Review of Systems  Constitutional: Negative for fever, weight loss, malaise/fatigue and diaphoresis.  HENT: Negative for ear discharge, ear pain, hearing loss, nosebleeds, sore throat and tinnitus.   Eyes: Negative for blurred vision and pain.  Respiratory: Negative for cough, hemoptysis, shortness of breath and wheezing.   Cardiovascular: Positive for chest pain. Negative for palpitations, orthopnea and leg swelling.  Gastrointestinal: Positive for constipation. Negative for heartburn, nausea, vomiting, abdominal pain, diarrhea and blood in stool.  Genitourinary: Negative for dysuria, urgency and frequency.  Musculoskeletal: Negative for myalgias and back pain.  Skin: Negative for itching and rash.  Neurological: Negative for dizziness, tingling, tremors, focal weakness, seizures, weakness and headaches.  Psychiatric/Behavioral: Negative for depression. The patient is not nervous/anxious.    DRUG ALLERGIES:   Allergies  Allergen Reactions  . Contrast Media [Iodinated Diagnostic Agents] Nausea And Vomiting   VITALS:  Blood pressure 112/74, pulse 64, temperature 98.3 F (36.8 C), temperature source Oral, resp. rate 22, height 5\' 10"  (1.778 m), weight 78.835 kg (173 lb 12.8 oz), SpO2 96 %. PHYSICAL EXAMINATION:  Physical Exam  Constitutional: He is oriented to person, place, and time and well-developed, well-nourished, and in no distress.  HENT:  Head: Normocephalic and atraumatic.  Eyes: Conjunctivae and EOM are normal. Pupils are equal, round, and reactive to light.  Neck: Normal range of motion. Neck supple. No tracheal deviation present. No thyromegaly  present.  Cardiovascular: Normal rate, regular rhythm and normal heart sounds.   Pulmonary/Chest: Effort normal and breath sounds normal. No respiratory distress. He has no wheezes. He exhibits no tenderness.  Abdominal: Soft. Bowel sounds are normal. He exhibits no distension. There is no tenderness.  Musculoskeletal: Normal range of motion.  Neurological: He is alert and oriented to person, place, and time. No cranial nerve deficit.  Skin: Skin is warm and dry. No rash noted.  Psychiatric: Mood and affect normal.   LABORATORY PANEL:   CBC  Recent Labs Lab 08/15/15 2345  WBC 6.9  HGB 14.3  HCT 44.7  PLT 186   ------------------------------------------------------------------------------------------------------------------ Chemistries   Recent Labs Lab 08/15/15 2345  NA 136  K 4.3  CL 104  CO2 25  GLUCOSE 131*  BUN 24*  CREATININE 1.38*  CALCIUM 10.7*  AST 21  ALT 10*  ALKPHOS 84  BILITOT 1.2   RADIOLOGY:  Dg Abd 1 View  08/15/2015   CLINICAL DATA:  74 year old male with without bowel movement in 4 days. Initial encounter.  EXAM: ABDOMEN - 1 VIEW  COMPARISON:  CT Abdomen and Pelvis 04/11/2015.  FINDINGS: 2 supine views of the abdomen and pelvis. Lung bases appear negative. Cardiomegaly again noted. Stable cholecystectomy clips. Non obstructed bowel gas pattern. Retained stool throughout the colon. Evidence of left colon diverticulosis. No acute osseous abnormality identified. No definite pneumoperitoneum on these supine views.  IMPRESSION: Non obstructed bowel gas pattern with retained stool throughout the colon.   Electronically Signed   By: Odessa Fleming M.D.   On: 08/15/2015 18:07   ASSESSMENT AND PLAN:  * Chest pain - monitor him on telemetry tonight.  His serial troponins so far seems negative, but he is continued to have  intermittent chest pressure, we will go and schedule Myoview for tomorrow.  Appreciate cardiology input  *Constipation - fecal disimpaction and enema  in the ED previous large volume stool. Patient states this pain is much improved.  We will start him on bowel regimen. Order Fleet enema  * HTN (hypertension) - currently controlled, continue home meds  * Chronic diastolic Heart failure with preserved left ventricular function - continue home dose Lasix with daily weights here, does not seem to be in exacerbation at this time. Patient does have some mild elevation in serum creatinine, though this is stable over the last 3 months or so. It is elevated from prior to that, but likely coincides with some increased Lasix use from heart failure exacerbation around that time. We'll monitor his serum creatinine is well to see if it will return any closer to normal.  * Type 2 diabetes mellitus - recently taken off of his home anti-glycemic, last A1c 2 months ago was controlled, we'll do sliding scale insulin with appropriate fingerstick glucose checks here on a carb modified diet.   * HLD (hyperlipidemia) - continue home meds   * GERD (gastroesophageal reflux disease) - home dose PPI    All the records are reviewed and case discussed with Care Management/Social Worker. Management plans discussed with the patient, family and they are in agreement.  CODE STATUS: Full code  TOTAL TIME TAKING CARE OF THIS PATIENT: 35 minutes.   More than 50% of the time was spent in counseling/coordination of care: YES  POSSIBLE D/C IN 1 DAYS, DEPENDING ON CLINICAL CONDITION.  Myoview results   [facility], Ilario Dhaliwal M.D on 08/16/2015 at 12:48 PM  Between 7am to 6pm - Pager - [phone]  After 6pm go to [email or web address] - password [email or web address] [facility]  [facility]  Office  [phone]  CC: Primary care physician; [facility]

## 2015-08-16 NOTE — H&P (Signed)
[facility] [place] [facility]   PATIENT NAME: Austin [name]    MR#:  [id number]  DATE OF BIRTH:  25-May-1941  DATE OF ADMISSION:  08/15/2015  PRIMARY CARE PHYSICIAN: [facility]   REQUESTING/REFERRING PHYSICIAN: Dolores Frame, M.D.  CHIEF COMPLAINT:   Chief Complaint  Patient presents with  . Constipation    HISTORY OF PRESENT ILLNESS:  Deiontae Rabel  is a 74 y.o. male who presents with significant constipation and fecal impaction. Patient initially got treatment in the ED with disimpaction and enema with removal of large volume stool. He then had routine labs performed, as well as cardiac enzymes after complaining to the ED physician of some recent intermittent left-sided chest pain. His troponin was found to be 0.05, barely positive. Patient states that his chest pain is intermittent, not inherently associated with activity, short in duration (lasting less than 1 minute), associated occasionally with some sweaty feeling, but otherwise denies headache, blurred vision, nausea, abdominal pain, radiation. He has a history of congestive heart failure, though his last echo on chart review showed an EF of 55%. Dr. Juliann Pares is his cardiologist. Hospitalists were called for admission for chest pain, to evaluate for ACS.  PAST MEDICAL HISTORY:   Past Medical History  Diagnosis Date  . COPD (chronic obstructive pulmonary disease)   . Gastric ulcer   . DVT (deep venous thrombosis)   . PE (pulmonary embolism)   . Hypertension   . Hypercholesteremia   . Diabetes mellitus without complication   . GERD (gastroesophageal reflux disease)   . Atrial fibrillation     PAST SURGICAL HISTORY:   Past Surgical History  Procedure Laterality Date  . Hernia repair    . Prostate surgery      SOCIAL HISTORY:   Social History  Substance Use Topics  . Smoking status: Former Smoker -- 1.00 packs/day    Quit date: 07/04/2015  . Smokeless tobacco: Not  on file  . Alcohol Use: No    FAMILY HISTORY:   Family History  Problem Relation Age of Onset  . CAD Brother   . Congestive Heart Failure Brother     DRUG ALLERGIES:   Allergies  Allergen Reactions  . Contrast Media [Iodinated Diagnostic Agents] Nausea And Vomiting    MEDICATIONS AT HOME:   Prior to Admission medications   Medication Sig Start Date End Date Taking? Authorizing Provider  furosemide (LASIX) 40 MG tablet Take 1 tablet (40 mg total) by mouth 2 (two) times daily. Patient taking differently: Take 20 mg by mouth 2 (two) times daily.  07/21/15  Yes Delma Freeze, FNP  glipiZIDE (GLUCOTROL) 5 MG tablet Take 5 mg by mouth 2 (two) times daily before a meal.   Yes Historical Provider, MD  lisinopril (PRINIVIL,ZESTRIL) 40 MG tablet Take 40 mg by mouth daily.   Yes Historical Provider, MD  lovastatin (MEVACOR) 40 MG tablet Take 40 mg by mouth daily with supper. Pt should take with food.   Yes Historical Provider, MD  metoprolol tartrate (LOPRESSOR) 25 MG tablet Take 12.5 mg by mouth 2 (two) times daily.    Yes Historical Provider, MD  omeprazole (PRILOSEC) 20 MG capsule Take 20 mg by mouth 2 (two) times daily.   Yes Historical Provider, MD  albuterol (PROVENTIL) (2.5 MG/3ML) 0.083% nebulizer solution Take 3 mLs (2.5 mg total) by nebulization every 6 (six) hours as needed for wheezing or shortness of breath. 07/08/15   Alford Highland, MD  albuterol-ipratropium (  COMBIVENT) 18-103 MCG/ACT inhaler Inhale 2 puffs into the lungs 4 (four) times daily.    Historical Provider, MD  budesonide-formoterol (SYMBICORT) 160-4.5 MCG/ACT inhaler Inhale 2 puffs into the lungs 2 (two) times daily.    Historical Provider, MD  cefUROXime (CEFTIN) 500 MG tablet Take 1 tablet (500 mg total) by mouth 2 (two) times daily with a meal. Patient not taking: Reported on 08/16/2015 07/08/15   Alford Highland, MD  metFORMIN (GLUMETZA) 500 MG (MOD) 24 hr tablet Take 500 mg by mouth 2 (two) times daily with a meal.     Historical Provider, MD  nicotine (NICODERM CQ - DOSED IN MG/24 HOURS) 21 mg/24hr patch Place 1 patch (21 mg total) onto the skin daily. 07/08/15   Alford Highland, MD  nitroGLYCERIN (NITROSTAT) 0.4 MG SL tablet Place 0.4 mg under the tongue every 5 (five) minutes x 3 doses as needed for chest pain. If no relief call 911 or go to emergency room.    Historical Provider, MD  potassium chloride SA (K-DUR,KLOR-CON) 20 MEQ tablet Take 1 tablet (20 mEq total) by mouth daily. 07/08/15   Alford Highland, MD  predniSONE (DELTASONE) 5 MG tablet Take 1 tablet (5 mg total) by mouth daily with breakfast. 4 tabs day1; 3 tabs day2; 2 tabs day3; 1 tab day4,5 then stop 07/08/15   Alford Highland, MD  tiotropium (SPIRIVA) 18 MCG inhalation capsule Place 1 capsule (18 mcg total) into inhaler and inhale daily. 07/08/15   Alford Highland, MD    REVIEW OF SYSTEMS:  Review of Systems  Constitutional: Negative for fever, chills, weight loss and malaise/fatigue.  HENT: Negative for ear pain, hearing loss and tinnitus.   Eyes: Negative for blurred vision, double vision, pain and redness.  Respiratory: Negative for cough, hemoptysis and shortness of breath.   Cardiovascular: Positive for chest pain. Negative for palpitations, orthopnea and leg swelling.  Gastrointestinal: Positive for nausea, abdominal pain and constipation. Negative for vomiting and diarrhea.  Genitourinary: Negative for dysuria, frequency and hematuria.  Musculoskeletal: Negative for back pain, joint pain and neck pain.  Skin:       No acne, rash, or lesions  Neurological: Negative for dizziness, tremors, focal weakness and weakness.  Endo/Heme/Allergies: Negative for polydipsia. Does not bruise/bleed easily.  Psychiatric/Behavioral: Negative for depression. The patient is not nervous/anxious and does not have insomnia.      VITAL SIGNS:   Filed Vitals:   08/15/15 2130 08/15/15 2330 08/16/15 0000 08/16/15 0100  BP: 114/93 115/97 135/94 125/87   Pulse: 63 76 75 74  Temp:      TempSrc:      Resp:      Height:      Weight:      SpO2: 98% 100% 100% 99%   Wt Readings from Last 3 Encounters:  08/15/15 87.544 kg (193 lb)  07/21/15 86.183 kg (190 lb)  07/08/15 102.105 kg (225 lb 1.6 oz)    PHYSICAL EXAMINATION:  Physical Exam  Vitals reviewed. Constitutional: He is oriented to person, place, and time. He appears well-developed and well-nourished. No distress.  HENT:  Head: Normocephalic and atraumatic.  Mouth/Throat: Oropharynx is clear and moist.  Eyes: Conjunctivae and EOM are normal. Pupils are equal, round, and reactive to light. No scleral icterus.  Neck: Normal range of motion. Neck supple. No JVD present. No thyromegaly present.  Cardiovascular: Normal rate, regular rhythm and intact distal pulses.  Exam reveals no gallop and no friction rub.   No murmur heard. Respiratory: Effort normal  and breath sounds normal. No respiratory distress. He has no wheezes. He has no rales.  GI: Soft. Bowel sounds are normal. He exhibits no distension. There is tenderness (mild right lower quadrant).  Musculoskeletal: Normal range of motion. He exhibits no edema.  No arthritis, no gout  Lymphadenopathy:    He has no cervical adenopathy.  Neurological: He is alert and oriented to person, place, and time. No cranial nerve deficit.  No dysarthria, no aphasia  Skin: Skin is warm and dry. No rash noted. No erythema.  Psychiatric: He has a normal mood and affect. His behavior is normal. Judgment and thought content normal.    LABORATORY PANEL:   CBC  Recent Labs Lab 08/15/15 2345  WBC 6.9  HGB 14.3  HCT 44.7  PLT 186   ------------------------------------------------------------------------------------------------------------------  Chemistries   Recent Labs Lab 08/15/15 2345  NA 136  K 4.3  CL 104  CO2 25  GLUCOSE 131*  BUN 24*  CREATININE 1.38*  CALCIUM 10.7*  AST 21  ALT 10*  ALKPHOS 84  BILITOT 1.2    ------------------------------------------------------------------------------------------------------------------  Cardiac Enzymes  Recent Labs Lab 08/15/15 2345  TROPONINI 0.05*   ------------------------------------------------------------------------------------------------------------------  RADIOLOGY:  Dg Abd 1 View  08/15/2015   CLINICAL DATA:  74 year old male with without bowel movement in 4 days. Initial encounter.  EXAM: ABDOMEN - 1 VIEW  COMPARISON:  CT Abdomen and Pelvis 04/11/2015.  FINDINGS: 2 supine views of the abdomen and pelvis. Lung bases appear negative. Cardiomegaly again noted. Stable cholecystectomy clips. Non obstructed bowel gas pattern. Retained stool throughout the colon. Evidence of left colon diverticulosis. No acute osseous abnormality identified. No definite pneumoperitoneum on these supine views.  IMPRESSION: Non obstructed bowel gas pattern with retained stool throughout the colon.   Electronically Signed   By: Odessa Fleming M.D.   On: 08/15/2015 18:07    EKG:   Orders placed or performed during the hospital encounter of 08/15/15  . ED EKG  . ED EKG  . EKG 12-Lead  . EKG 12-Lead    IMPRESSION AND PLAN:  Principal Problem:   Chest pain - see history of present illness for details on history, EKG without new changes, we'll trend his enzymes tonight. We'll have him on telemetry tonight. Consider cardiology consult to Dr. Juliann Pares in the morning if his enzymes rise, or if his symptoms return or progress. If he remains largely asymptomatic with no significant elevation of cardiac enzymes, could even consider outpatient evaluation of his chest pain. Active Problems:   Constipation - fecal disimpaction and enema in the ED previous large volume stool. Patient states this pain is much improved. He still has minimal discomfort, and if this persists through the morning could consider reevaluating him and possibly administering another enema.   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 Patient does have some mild elevation in serum creatinine, though this is stable over the last 3 months or so. It is elevated from prior to that, but likely coincides with some increased Lasix use from heart failure exacerbation around that time. We'll monitor his serum creatinine is well to see if it will return any closer to normal.   Type 2 diabetes mellitus - recently taken off of his home anti-glycemic, last A1c 2 months ago was controlled, we'll do sliding scale insulin with appropriate  fingerstick glucose checks here on a carb modified diet.   HLD (hyperlipidemia) - continue home meds   GERD (gastroesophageal reflux disease) - home dose PPI  All the records are reviewed and case discussed with ED provider. Management plans discussed with the patient and/or family.  DVT PROPHYLAXIS: SubQ heparin  ADMISSION STATUS: Observation  CODE STATUS: Full  TOTAL TIME TAKING CARE OF THIS PATIENT: 40 minutes.    Malissie Musgrave FIELDING 08/16/2015, 1:43 AM  [facility]  Office  [phone]  CC: Primary care physician; [facility]

## 2015-08-16 NOTE — ED Provider Notes (Signed)
-----------------------------------------   12:50 AM on 08/16/2015 -----------------------------------------  ED ECG REPORT I, SUNG,JADE J, the attending physician, personally viewed and interpreted this ECG.   Date: 08/16/2015  EKG Time: 0006  Rate: 76  Rhythm: normal EKG, normal sinus rhythm  Axis: Normal  Intervals:first-degree A-V block   ST&T Change: Inverted T waves inferior lateral leads  Patient with an elevated troponin and renal insufficiency. Patient tells me he has been experiencing intermittent left-sided chest pressure associated with diaphoresis and shortness of breath. We'll administer aspirin, nitroglycerin. Will initiate IV fluids and discuss with hospitalist for admission.   Irean Hong, MD 08/16/15 [phone]

## 2015-08-16 NOTE — ED Notes (Signed)
Informed Dr. Dolores Frame of  troponin level of 0.05

## 2015-08-17 ENCOUNTER — Observation Stay: Payer: BLUE CROSS/BLUE SHIELD

## 2015-08-17 LAB — NM MYOCAR MULTI W/SPECT W/WALL MOTION / EF
CHL CUP NUCLEAR SSS: 0
CHL CUP RESTING HR STRESS: 63 {beats}/min
CSEPPHR: 82 {beats}/min
LV dias vol: 82 mL
LVSYSVOL: 33 mL
SDS: 0
SRS: 0
TID: 0.97

## 2015-08-17 LAB — GLUCOSE, CAPILLARY
GLUCOSE-CAPILLARY: 68 mg/dL (ref 65–99)
GLUCOSE-CAPILLARY: 161 mg/dL — AB (ref 65–99)
Glucose-Capillary: 88 mg/dL (ref 65–99)

## 2015-08-17 MED ORDER — BISACODYL 10 MG RE SUPP
10.0000 mg | Freq: Once | RECTAL | Status: AC
  Administered 2015-08-17: 10 mg via RECTAL
  Filled 2015-08-17: qty 1

## 2015-08-17 MED ORDER — TECHNETIUM TC 99M SESTAMIBI GENERIC - CARDIOLITE
30.0000 | Freq: Once | INTRAVENOUS | Status: AC | PRN
  Administered 2015-08-17: 32.8 via INTRAVENOUS

## 2015-08-17 MED ORDER — REGADENOSON 0.4 MG/5ML IV SOLN
0.4000 mg | Freq: Once | INTRAVENOUS | Status: AC
  Administered 2015-08-17: 0.4 mg via INTRAVENOUS
  Filled 2015-08-17: qty 5

## 2015-08-17 MED ORDER — TECHNETIUM TC 99M SESTAMIBI GENERIC - CARDIOLITE
10.0000 | Freq: Once | INTRAVENOUS | Status: AC | PRN
  Administered 2015-08-17: 12.6 via INTRAVENOUS

## 2015-08-17 MED ORDER — POLYETHYLENE GLYCOL 3350 17 G PO PACK: 17.0000 g | PACK | Freq: Every day | ORAL | Status: AC | PRN

## 2015-08-17 NOTE — Progress Notes (Signed)
Patient is discharge home in a stable condition, denies pain and sob , no distress noted at time of discharge summary and f/u care given , verbalized understanding , left with wife

## 2015-08-17 NOTE — Care Management (Signed)
Patient admitted with chest pain and elevated troponin.      Patient presents from home and independent in all adls.   Has health insurance.  Denies issues accessing medical care, obtaining medications, maintaining housing, utilities and food.   No discharge needs identified at present time.

## 2015-08-17 NOTE — Discharge Instructions (Signed)
You were seen in the emergency department today for constipation.  We recommend that you use one or more of the following over-the-counter medications in the order described: °  °1)  Colace (or Dulcolax) 100 mg:  This is a stool softener, and you may take it once or twice a day as needed. °2)  Senna tablets:  This is a bowel stimulant that will help "push" out your stool. It is the next step to add after you have tried a stool softener. °3)  Miralax (powder):  This medication works by drawing additional fluid into your intestines and helps to flush out your stool.  Mix the powder with water or juice according to label instructions.  It may help if the Colace and Senna are not sufficient, but you must be sure to use the recommended amount of water or juice when you mix up the powder. °Remember that narcotic pain medications are constipating, so avoid them or minimize their use.  Drink plenty of fluids. ° °Please return to the Emergency Department immediately if you develop new or worsening symptoms that concern you, such as (but not limited to) fever > 101 degrees, severe abdominal pain, or persistent vomiting. ° ° °Constipation °Constipation is when a person has fewer than three bowel movements a week, has difficulty having a bowel movement, or has stools that are dry, hard, or larger than normal. As people grow older, constipation is more common. If you try to fix constipation with medicines that make you have a bowel movement (laxatives), the problem may get worse. Long-term laxative use may cause the muscles of the colon to become weak. A low-fiber diet, not taking in enough fluids, and taking certain medicines may make constipation worse.  °CAUSES  °Certain medicines, such as antidepressants, pain medicine, iron supplements, antacids, and water pills.   °Certain diseases, such as diabetes, irritable bowel syndrome (IBS), thyroid disease, or depression.   °Not drinking enough water.   °Not eating enough  fiber-rich foods.   °Stress or travel.   °Lack of physical activity or exercise.   °Ignoring the urge to have a bowel movement.   °Using laxatives too much.   °SIGNS AND SYMPTOMS  °Having fewer than three bowel movements a week.   °Straining to have a bowel movement.   °Having stools that are hard, dry, or larger than normal.   °Feeling full or bloated.   °Pain in the lower abdomen.   °Not feeling relief after having a bowel movement.   °DIAGNOSIS  °Your health care provider will take a medical history and perform a physical exam. Further testing may be done for severe constipation. Some tests may include: °A barium enema X-ray to examine your rectum, colon, and, sometimes, your small intestine.   °A sigmoidoscopy to examine your lower colon.   °A colonoscopy to examine your entire colon. °TREATMENT  °Treatment will depend on the severity of your constipation and what is causing it. Some dietary treatments include drinking more fluids and eating more fiber-rich foods. Lifestyle treatments may include regular exercise. If these diet and lifestyle recommendations do not help, your health care provider may recommend taking over-the-counter laxative medicines to help you have bowel movements. Prescription medicines may be prescribed if over-the-counter medicines do not work.  °HOME CARE INSTRUCTIONS  °Eat foods that have a lot of fiber, such as fruits, vegetables, whole grains, and beans. °Limit foods high in fat and processed sugars, such as french fries, hamburgers, cookies, candies, and soda.   °A   A fiber supplement may be added to your diet if you cannot get enough fiber from foods.   Drink enough fluids to keep your urine clear or pale yellow.   Exercise regularly or as directed by your health care provider.   Go to the restroom when you have the urge to go. Do not hold it.   Only take over-the-counter or prescription medicines as directed by your health care provider. Do  not take other medicines for constipation without talking to your health care provider first.  SEEK IMMEDIATE MEDICAL CARE IF:   You have bright red blood in your stool.   Your constipation lasts for more than 4 days or gets worse.   You have abdominal or rectal pain.   You have thin, pencil-like stools.   You have unexplained weight loss. MAKE SURE YOU:   Understand these instructions.  Will watch your condition.  Will get help right away if you are not doing well or get worse. Document Released: 08/25/2004 Document Revised: 12/02/2013 Document Reviewed: 09/08/2013 [facility] Patient Information 2015 [place], [place]. This information is not intended to replace advice given to you by your health care provider. Make sure you discuss any questions you have with your health care provider.   Chest Pain (Nonspecific) It is often hard to give a specific diagnosis for the cause of chest pain. There is always a chance that your pain could be related to something serious, such as a heart attack or a blood clot in the lungs. You need to follow up with your health care provider for further evaluation. CAUSES   Heartburn.  Pneumonia or bronchitis.  Anxiety or stress.  Inflammation around your heart (pericarditis) or lung (pleuritis or pleurisy).  A blood clot in the lung.  A collapsed lung (pneumothorax). It can develop suddenly on its own (spontaneous pneumothorax) or from trauma to the chest.  Shingles infection (herpes zoster virus). The chest wall is composed of bones, muscles, and cartilage. Any of these can be the source of the pain.  The bones can be bruised by injury.  The muscles or cartilage can be strained by coughing or overwork.  The cartilage can be affected by inflammation and become sore (costochondritis). DIAGNOSIS  Lab tests or other studies may be needed to find the cause of your pain. Your health care provider may have you take a test called an ambulatory  electrocardiogram (ECG). An ECG records your heartbeat patterns over a 24-hour period. You may also have other tests, such as:  Transthoracic echocardiogram (TTE). During echocardiography, sound waves are used to evaluate how blood flows through your heart.  Transesophageal echocardiogram (TEE).  Cardiac monitoring. This allows your health care provider to monitor your heart rate and rhythm in real time.  Holter monitor. This is a portable device that records your heartbeat and can help diagnose heart arrhythmias. It allows your health care provider to track your heart activity for several days, if needed.  Stress tests by exercise or by giving medicine that makes the heart beat faster. TREATMENT   Treatment depends on what may be causing your chest pain. Treatment may include:  Acid blockers for heartburn.  Anti-inflammatory medicine.  Pain medicine for inflammatory conditions.  Antibiotics if an infection is present.  You may be advised to change lifestyle habits. This includes stopping smoking and avoiding alcohol, caffeine, and chocolate.  You may be advised to keep your head raised (elevated) when sleeping. This reduces the chance of acid going backward from your  stomach into your esophagus. Most of the time, nonspecific chest pain will improve within 2-3 days with rest and mild pain medicine.  HOME CARE INSTRUCTIONS   If antibiotics were prescribed, take them as directed. Finish them even if you start to feel better.  For the next few days, avoid physical activities that bring on chest pain. Continue physical activities as directed.  Do not use any tobacco products, including cigarettes, chewing tobacco, or electronic cigarettes.  Avoid drinking alcohol.  Only take medicine as directed by your health care provider.  Follow your health care provider's suggestions for further testing if your chest pain does not go away.  Keep any follow-up appointments you made. If you do  not go to an appointment, you could develop lasting (chronic) problems with pain. If there is any problem keeping an appointment, call to reschedule. SEEK MEDICAL CARE IF:   Your chest pain does not go away, even after treatment.  You have a rash with blisters on your chest.  You have a fever. SEEK IMMEDIATE MEDICAL CARE IF:   You have increased chest pain or pain that spreads to your arm, neck, jaw, back, or abdomen.  You have shortness of breath.  You have an increasing cough, or you cough up blood.  You have severe back or abdominal pain.  You feel nauseous or vomit.  You have severe weakness.  You faint.  You have chills. This is an emergency. Do not wait to see if the pain will go away. Get medical help at once. Call your local emergency services (911 in [place] Do not drive yourself to the hospital. MAKE SURE YOU:   Understand these instructions.  Will watch your condition.  Will get help right away if you are not doing well or get worse. Document Released: 09/06/2005 Document Revised: 12/02/2013 Document Reviewed: 07/02/2008 [facility] Patient Information 2015 [place], [place]. This information is not intended to replace advice given to you by your health care provider. Make sure you discuss any questions you have with your health care provider.

## 2015-08-17 NOTE — Discharge Summary (Addendum)
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 COPD (chronic obstructive pulmonary disease)   . Gastric ulcer   . DVT (deep venous thrombosis)   . PE (pulmonary embolism)   . Hypertension   . Hypercholesteremia   . Diabetes mellitus without complication   . GERD (gastroesophageal reflux disease)   . Atrial fibrillation    HOSPITAL COURSE:  74 y.o. male who presents with significant constipation and fecal impaction. Patient initially got treatment in the ED with disimpaction and enema with removal of large volume stool. He then had routine labs performed, as well as cardiac enzymes after complaining to the ED physician of some recent intermittent left-sided chest pain. His troponin was found to be 0.05, barely positive.  Please see Dr. Clarisa Kindred dictated history and physical for further details.  Patient was ruled out with serial troponins.  Cardiology consultation was obtained with Dr.Paraschos.   Myoview Results showed:   There was no ST segment deviation noted during stress.  The study is normal.  This is a low risk study.  The left ventricular ejection fraction is normal (55-65%).  Recommend Myoview which was performed and has been negative  as listed above. Patient's chest pain management is otherwise feeling much better and is being discharged home in stable condition.  He is agreeable with the discharge plans.   DISCHARGE CONDITIONS:  Stable CONSULTS OBTAINED:  Treatment Team:  Marcina Millard, MD  DRUG ALLERGIES:   Allergies  Allergen Reactions  . Contrast Media [Iodinated Diagnostic Agents] Nausea And Vomiting   DISCHARGE MEDICATIONS:   Current Discharge Medication List    CONTINUE these medications which have NOT CHANGED   Details  furosemide (LASIX) 40 MG tablet Take 1 tablet (40 mg total) by mouth 2 (two) times daily. Qty: 60 tablet, Refills: 5    glipiZIDE (GLUCOTROL) 5 MG tablet Take 5 mg by mouth 2 (two) times daily before a meal.    lisinopril (PRINIVIL,ZESTRIL) 40 MG tablet Take 40 mg by mouth daily.    lovastatin (MEVACOR) 40 MG tablet Take 40 mg by mouth daily with supper. Pt should take with food.    metoprolol tartrate (LOPRESSOR) 25 MG tablet Take 12.5 mg by mouth 2 (two) times daily.     omeprazole (PRILOSEC) 20 MG capsule Take 20 mg by mouth 2 (two) times daily.    albuterol (PROVENTIL) (2.5 MG/3ML) 0.083% nebulizer solution Take 3 mLs (2.5 mg total) by nebulization every 6 (six) hours as needed for wheezing or shortness of breath. Qty: 75 mL, Refills: 12    albuterol-ipratropium (COMBIVENT) 18-103 MCG/ACT inhaler Inhale 2 puffs into the lungs 4 (four) times daily.    budesonide-formoterol (SYMBICORT) 160-4.5 MCG/ACT inhaler Inhale 2 puffs into the lungs 2 (two) times daily.    cefUROXime (CEFTIN) 500 MG  tablet Take 1 tablet (500 mg total) by mouth 2 (two) times daily with a meal. Qty: 16 tablet, Refills: 0    metFORMIN (GLUMETZA) 500 MG (MOD) 24 hr tablet Take 500 mg by mouth 2 (two) times daily with a meal.    nicotine (NICODERM CQ - DOSED IN MG/24 HOURS) 21 mg/24hr patch Place 1 patch (21 mg total) onto the skin daily. Qty: 28 patch, Refills: 0    nitroGLYCERIN (NITROSTAT) 0.4 MG  SL tablet Place 0.4 mg under the tongue every 5 (five) minutes x 3 doses as needed for chest pain. If no relief call 911 or go to emergency room.    potassium chloride SA (K-DUR,KLOR-CON) 20 MEQ tablet Take 1 tablet (20 mEq total) by mouth daily. Qty: 30 tablet, Refills: 0    predniSONE (DELTASONE) 5 MG tablet Take 1 tablet (5 mg total) by mouth daily with breakfast. 4 tabs day1; 3 tabs day2; 2 tabs day3; 1 tab day4,5 then stop Qty: 11 tablet, Refills: 0    tiotropium (SPIRIVA) 18 MCG inhalation capsule Place 1 capsule (18 mcg total) into inhaler and inhale daily. Qty: 30 capsule, Refills: 0       DISCHARGE INSTRUCTIONS:   DIET:  Cardiac diet  DISCHARGE CONDITION:  Good  ACTIVITY:  Activity as tolerated  OXYGEN:  Home Oxygen: No.   Oxygen Delivery: room air  DISCHARGE LOCATION:  home   If you experience worsening of your admission symptoms, develop shortness of breath, life threatening emergency, suicidal or homicidal thoughts you must seek medical attention immediately by calling 911 or calling your MD immediately  if symptoms less severe.  You Must read complete instructions/literature along with all the possible adverse reactions/side effects for all the Medicines you take and that have been prescribed to you. Take any new Medicines after you have completely understood and accpet all the possible adverse reactions/side effects.   Please note  You were cared for by a hospitalist during your hospital stay. If you have any questions about your discharge medications or the care you received while you were in the hospital after you are discharged, you can call the unit and asked to speak with the hospitalist on call if the hospitalist that took care of you is not available. Once you are discharged, your primary care physician will handle any further medical issues. Please note that NO REFILLS for any discharge medications will be authorized once you are discharged, as it is  imperative that you return to your primary care physician (or establish a relationship with a primary care physician if you do not have one) for your aftercare needs so that they can reassess your need for medications and monitor your lab values.    On the day of Discharge: VITAL SIGNS:  Blood pressure 135/80, pulse 68, temperature 97.5 F (36.4 C), temperature source Oral, resp. rate 20, height 5\' 10"  (1.778 m), weight 79.878 kg (176 lb 1.6 oz), SpO2 100 %. PHYSICAL EXAMINATION:  GENERAL:  74 y.o.-year-old patient lying in the bed with no acute distress.  EYES: Pupils equal, round, reactive to light and accommodation. No scleral icterus. Extraocular muscles intact.  HEENT: Head atraumatic, normocephalic. Oropharynx and nasopharynx clear.  NECK:  Supple, no jugular venous distention. No thyroid enlargement, no tenderness.  LUNGS: Normal breath sounds bilaterally, no wheezing, rales,rhonchi or crepitation. No use of accessory muscles of respiration.  CARDIOVASCULAR: S1, S2 normal. No murmurs, rubs, or gallops.  ABDOMEN: Soft, non-tender, non-distended. Bowel sounds present. No organomegaly or  mass.  EXTREMITIES: No pedal edema, cyanosis, or clubbing.  NEUROLOGIC: Cranial nerves II through XII are intact. Muscle strength 5/5 in all extremities. Sensation intact. Gait not checked.  PSYCHIATRIC: The patient is alert and oriented x 3.  SKIN: No obvious rash, lesion, or ulcer.  DATA REVIEW:   CBC  Recent Labs Lab 08/15/15 2345  WBC 6.9  HGB 14.3  HCT 44.7  PLT 186    Chemistries   Recent Labs Lab 08/15/15 2345  NA 136  K 4.3  CL 104  CO2 25  GLUCOSE 131*  BUN 24*  CREATININE 1.38*  CALCIUM 10.7*  AST 21  ALT 10*  ALKPHOS 84  BILITOT 1.2    Cardiac Enzymes  Recent Labs Lab 08/16/15 1854  TROPONINI 0.05*    RADIOLOGY:  Dg Abd 1 View  08/15/2015   CLINICAL DATA:  74 year old male with without bowel movement in 4 days. Initial encounter.  EXAM: ABDOMEN - 1 VIEW   COMPARISON:  CT Abdomen and Pelvis 04/11/2015.  FINDINGS: 2 supine views of the abdomen and pelvis. Lung bases appear negative. Cardiomegaly again noted. Stable cholecystectomy clips. Non obstructed bowel gas pattern. Retained stool throughout the colon. Evidence of left colon diverticulosis. No acute osseous abnormality identified. No definite pneumoperitoneum on these supine views.  IMPRESSION: Non obstructed bowel gas pattern with retained stool throughout the colon.   Electronically Signed   By: Odessa Fleming M.D.   On: 08/15/2015 18:07   Management plans discussed with the patient, family and they are in agreement.  CODE STATUS: full code   TOTAL TIME TAKING CARE OF THIS PATIENT: 55 minutes.    [facility], Edward Guthmiller M.D on 08/17/2015 at 3:14 PM  Between 7am to 6pm - Pager - [phone]  After 6pm go to [email or web address] - password [email or web address] [facility]  [place] [facility]  Office  [phone]  CC: Primary care physician; [facility] Marcina Millard, MD

## 2015-08-19 ENCOUNTER — Other Ambulatory Visit: Payer: Self-pay | Admitting: Specialist

## 2015-08-19 DIAGNOSIS — I272 Pulmonary hypertension, unspecified: Secondary | ICD-10-CM

## 2015-08-20 ENCOUNTER — Encounter: Admission: RE | Admit: 2015-08-20 | Payer: BLUE CROSS/BLUE SHIELD | Source: Ambulatory Visit

## 2015-08-23 ENCOUNTER — Ambulatory Visit
Admission: RE | Admit: 2015-08-23 | Discharge: 2015-08-23 | Disposition: A | Discharge status: Home / Self Care | Payer: BLUE CROSS/BLUE SHIELD | Source: Ambulatory Visit | Attending: Specialist | Admitting: Specialist

## 2015-08-23 ENCOUNTER — Ambulatory Visit: Payer: Medicare Other | Admitting: Family

## 2015-08-23 ENCOUNTER — Telehealth: Payer: Self-pay | Admitting: Family

## 2015-08-23 DIAGNOSIS — I272 Other secondary pulmonary hypertension: Secondary | ICD-10-CM | POA: Insufficient documentation

## 2015-08-23 DIAGNOSIS — I517 Cardiomegaly: Secondary | ICD-10-CM | POA: Diagnosis not present

## 2015-08-23 MED ORDER — TECHNETIUM TC 99M DIETHYLENETRIAME-PENTAACETIC ACID
44.5000 | Freq: Once | INTRAVENOUS | Status: DC | PRN
  Administered 2015-08-23: 44.5 via RESPIRATORY_TRACT
  Filled 2015-08-23: qty 44.5

## 2015-08-23 MED ORDER — TECHNETIUM TO 99M ALBUMIN AGGREGATED
4.4300 | Freq: Once | INTRAVENOUS | Status: AC | PRN
  Administered 2015-08-23: 4.43 via INTRAVENOUS

## 2015-08-23 NOTE — Telephone Encounter (Signed)
Patient did not show up for his appointment today (08/23/15) at [facility]. Will attempt to reschedule.

## 2015-08-24 ENCOUNTER — Encounter: Payer: Self-pay | Admitting: *Deleted

## 2015-08-24 ENCOUNTER — Inpatient Hospital Stay
Admission: AD | Admit: 2015-08-24 | Discharge: 2015-08-27 | DRG: 176 | Disposition: A | Discharge status: Home / Self Care | Payer: BLUE CROSS/BLUE SHIELD | Source: Ambulatory Visit | Attending: Internal Medicine | Admitting: Internal Medicine

## 2015-08-24 ENCOUNTER — Ambulatory Visit
Admission: RE | Admit: 2015-08-24 | Discharge: 2015-08-24 | Disposition: A | Discharge status: Home / Self Care | Payer: BLUE CROSS/BLUE SHIELD | Source: Ambulatory Visit | Attending: Specialist | Admitting: Specialist

## 2015-08-24 ENCOUNTER — Other Ambulatory Visit: Payer: Self-pay | Admitting: Specialist

## 2015-08-24 DIAGNOSIS — I2510 Atherosclerotic heart disease of native coronary artery without angina pectoris: Secondary | ICD-10-CM | POA: Diagnosis not present

## 2015-08-24 DIAGNOSIS — I4891 Unspecified atrial fibrillation: Secondary | ICD-10-CM | POA: Diagnosis present

## 2015-08-24 DIAGNOSIS — J9610 Chronic respiratory failure, unspecified whether with hypoxia or hypercapnia: Secondary | ICD-10-CM | POA: Diagnosis present

## 2015-08-24 DIAGNOSIS — Z87891 Personal history of nicotine dependence: Secondary | ICD-10-CM | POA: Diagnosis not present

## 2015-08-24 DIAGNOSIS — I82501 Chronic embolism and thrombosis of unspecified deep veins of right lower extremity: Secondary | ICD-10-CM | POA: Diagnosis present

## 2015-08-24 DIAGNOSIS — I82431 Acute embolism and thrombosis of right popliteal vein: Secondary | ICD-10-CM | POA: Diagnosis present

## 2015-08-24 DIAGNOSIS — K219 Gastro-esophageal reflux disease without esophagitis: Secondary | ICD-10-CM | POA: Diagnosis present

## 2015-08-24 DIAGNOSIS — R0602 Shortness of breath: Secondary | ICD-10-CM

## 2015-08-24 DIAGNOSIS — E119 Type 2 diabetes mellitus without complications: Secondary | ICD-10-CM | POA: Diagnosis present

## 2015-08-24 DIAGNOSIS — I2782 Chronic pulmonary embolism: Secondary | ICD-10-CM | POA: Diagnosis present

## 2015-08-24 DIAGNOSIS — J45909 Unspecified asthma, uncomplicated: Secondary | ICD-10-CM | POA: Diagnosis present

## 2015-08-24 DIAGNOSIS — Z794 Long term (current) use of insulin: Secondary | ICD-10-CM | POA: Diagnosis not present

## 2015-08-24 DIAGNOSIS — J449 Chronic obstructive pulmonary disease, unspecified: Secondary | ICD-10-CM | POA: Diagnosis present

## 2015-08-24 DIAGNOSIS — J439 Emphysema, unspecified: Secondary | ICD-10-CM | POA: Diagnosis not present

## 2015-08-24 DIAGNOSIS — I10 Essential (primary) hypertension: Secondary | ICD-10-CM | POA: Diagnosis present

## 2015-08-24 DIAGNOSIS — Z8711 Personal history of peptic ulcer disease: Secondary | ICD-10-CM | POA: Diagnosis not present

## 2015-08-24 DIAGNOSIS — K259 Gastric ulcer, unspecified as acute or chronic, without hemorrhage or perforation: Secondary | ICD-10-CM | POA: Diagnosis present

## 2015-08-24 DIAGNOSIS — E11649 Type 2 diabetes mellitus with hypoglycemia without coma: Secondary | ICD-10-CM | POA: Diagnosis present

## 2015-08-24 DIAGNOSIS — I272 Other secondary pulmonary hypertension: Secondary | ICD-10-CM | POA: Diagnosis present

## 2015-08-24 DIAGNOSIS — R609 Edema, unspecified: Secondary | ICD-10-CM

## 2015-08-24 DIAGNOSIS — E780 Pure hypercholesterolemia: Secondary | ICD-10-CM | POA: Diagnosis present

## 2015-08-24 DIAGNOSIS — J069 Acute upper respiratory infection, unspecified: Secondary | ICD-10-CM | POA: Diagnosis present

## 2015-08-24 DIAGNOSIS — I2699 Other pulmonary embolism without acute cor pulmonale: Secondary | ICD-10-CM | POA: Diagnosis present

## 2015-08-24 DIAGNOSIS — Z79899 Other long term (current) drug therapy: Secondary | ICD-10-CM | POA: Diagnosis not present

## 2015-08-24 HISTORY — DX: Unspecified asthma, uncomplicated: J45.909

## 2015-08-24 LAB — BASIC METABOLIC PANEL
Anion gap: 7 (ref 5–15)
BUN: 22 mg/dL — AB (ref 6–20)
CO2: 25 mmol/L (ref 22–32)
CREATININE: 1.41 mg/dL — AB (ref 0.61–1.24)
Calcium: 10.8 mg/dL — ABNORMAL HIGH (ref 8.9–10.3)
Chloride: 104 mmol/L (ref 101–111)
GFR calc Af Amer: 56 mL/min — ABNORMAL LOW (ref >60)
GFR, EST NON AFRICAN AMERICAN: 48 mL/min — AB (ref >60)
Glucose, Bld: 135 mg/dL — ABNORMAL HIGH (ref 65–99)
Potassium: 5.9 mmol/L — ABNORMAL HIGH (ref 3.5–5.1)
SODIUM: 136 mmol/L (ref 135–145)

## 2015-08-24 LAB — CBC WITH DIFFERENTIAL/PLATELET
Basophils Absolute: 0.1 10*3/uL (ref 0–0.1)
Basophils Relative: 1 %
EOS ABS: 0.2 10*3/uL (ref 0–0.7)
EOS PCT: 3 %
HCT: 45.8 % (ref 40.0–52.0)
Hemoglobin: 14.6 g/dL (ref 13.0–18.0)
LYMPHS ABS: 1.1 10*3/uL (ref 1.0–3.6)
Lymphocytes Relative: 22 %
MCH: 30.5 pg (ref 26.0–34.0)
MCHC: 32 g/dL (ref 32.0–36.0)
MCV: 95.3 fL (ref 80.0–100.0)
MONOS PCT: 11 %
Monocytes Absolute: 0.6 10*3/uL (ref 0.2–1.0)
Neutro Abs: 3.2 10*3/uL (ref 1.4–6.5)
Neutrophils Relative %: 63 %
PLATELETS: 156 10*3/uL (ref 150–440)
RBC: 4.81 MIL/uL (ref 4.40–5.90)
RDW: 17 % — AB (ref 11.5–14.5)
WBC: 5.2 10*3/uL (ref 3.8–10.6)

## 2015-08-24 LAB — APTT: aPTT: 31 seconds (ref 24–36)

## 2015-08-24 LAB — PROTIME-INR
INR: 1.23
PROTHROMBIN TIME: 15.7 s — AB (ref 11.4–15.0)

## 2015-08-24 LAB — GLUCOSE, CAPILLARY: Glucose-Capillary: 130 mg/dL — ABNORMAL HIGH (ref 65–99)

## 2015-08-24 MED ORDER — INSULIN ASPART 100 UNIT/ML ~~LOC~~ SOLN
0.0000 [IU] | Freq: Three times a day (TID) | SUBCUTANEOUS | Status: DC
  Administered 2015-08-25: 2 [IU] via SUBCUTANEOUS
  Filled 2015-08-24: qty 2

## 2015-08-24 MED ORDER — SODIUM CHLORIDE 0.9 % IJ SOLN
3.0000 mL | Freq: Two times a day (BID) | INTRAMUSCULAR | Status: DC
  Administered 2015-08-25 – 2015-08-26 (×2): 3 mL via INTRAVENOUS

## 2015-08-24 MED ORDER — ACETAMINOPHEN 325 MG PO TABS: 650.0000 mg | ORAL_TABLET | Freq: Four times a day (QID) | ORAL | Status: DC | PRN

## 2015-08-24 MED ORDER — PANTOPRAZOLE SODIUM 40 MG PO TBEC: 40.0000 mg | DELAYED_RELEASE_TABLET | Freq: Every day | ORAL | Status: DC

## 2015-08-24 MED ORDER — TIOTROPIUM BROMIDE MONOHYDRATE 18 MCG IN CAPS
18.0000 ug | ORAL_CAPSULE | Freq: Every day | RESPIRATORY_TRACT | Status: DC
  Administered 2015-08-25 – 2015-08-27 (×3): 18 ug via RESPIRATORY_TRACT
  Filled 2015-08-24: qty 5

## 2015-08-24 MED ORDER — CEFUROXIME AXETIL 500 MG PO TABS: 500.0000 mg | ORAL_TABLET | Freq: Two times a day (BID) | ORAL | Status: DC

## 2015-08-24 MED ORDER — BUDESONIDE-FORMOTEROL FUMARATE 160-4.5 MCG/ACT IN AERO
2.0000 | INHALATION_SPRAY | Freq: Two times a day (BID) | RESPIRATORY_TRACT | Status: DC
  Administered 2015-08-24 – 2015-08-27 (×6): 2 via RESPIRATORY_TRACT
  Filled 2015-08-24 (×2): qty 6

## 2015-08-24 MED ORDER — GLIPIZIDE 5 MG PO TABS: 5.0000 mg | ORAL_TABLET | Freq: Two times a day (BID) | ORAL | Status: DC

## 2015-08-24 MED ORDER — ALBUTEROL SULFATE (2.5 MG/3ML) 0.083% IN NEBU: 2.5000 mg | INHALATION_SOLUTION | Freq: Four times a day (QID) | RESPIRATORY_TRACT | Status: DC | PRN

## 2015-08-24 MED ORDER — SODIUM CHLORIDE 0.9 % IV SOLN
INTRAVENOUS | Status: DC
  Administered 2015-08-24 – 2015-08-26 (×3): via INTRAVENOUS

## 2015-08-24 MED ORDER — HEPARIN BOLUS VIA INFUSION
4000.0000 [IU] | Freq: Once | INTRAVENOUS | Status: AC
  Administered 2015-08-24: 4000 [IU] via INTRAVENOUS
  Filled 2015-08-24: qty 4000

## 2015-08-24 MED ORDER — NITROGLYCERIN 0.4 MG SL SUBL: 0.4000 mg | SUBLINGUAL_TABLET | SUBLINGUAL | Status: DC | PRN

## 2015-08-24 MED ORDER — IPRATROPIUM-ALBUTEROL 0.5-2.5 (3) MG/3ML IN SOLN
3.0000 mL | Freq: Four times a day (QID) | RESPIRATORY_TRACT | Status: DC
  Administered 2015-08-25 – 2015-08-27 (×10): 3 mL via RESPIRATORY_TRACT
  Filled 2015-08-24 (×11): qty 3

## 2015-08-24 MED ORDER — METFORMIN HCL ER 500 MG PO TB24
500.0000 mg | ORAL_TABLET | Freq: Two times a day (BID) | ORAL | Status: DC
  Administered 2015-08-25: 500 mg via ORAL
  Filled 2015-08-24 (×5): qty 1

## 2015-08-24 MED ORDER — ACETAMINOPHEN 650 MG RE SUPP: 650.0000 mg | Freq: Four times a day (QID) | RECTAL | Status: DC | PRN

## 2015-08-24 MED ORDER — NICOTINE 21 MG/24HR TD PT24
21.0000 mg | MEDICATED_PATCH | Freq: Every day | TRANSDERMAL | Status: DC
  Administered 2015-08-25 – 2015-08-27 (×3): 21 mg via TRANSDERMAL
  Filled 2015-08-24 (×4): qty 1

## 2015-08-24 MED ORDER — IPRATROPIUM-ALBUTEROL 0.5-2.5 (3) MG/3ML IN SOLN
3.0000 mL | Freq: Four times a day (QID) | RESPIRATORY_TRACT | Status: DC
  Administered 2015-08-24: 3 mL via RESPIRATORY_TRACT
  Filled 2015-08-24: qty 3

## 2015-08-24 MED ORDER — IOHEXOL 350 MG/ML SOLN
75.0000 mL | Freq: Once | INTRAVENOUS | Status: AC | PRN
  Administered 2015-08-24: 75 mL via INTRAVENOUS

## 2015-08-24 MED ORDER — POLYETHYLENE GLYCOL 3350 17 G PO PACK: 17.0000 g | PACK | Freq: Every day | ORAL | Status: DC | PRN

## 2015-08-24 MED ORDER — LISINOPRIL 20 MG PO TABS
40.0000 mg | ORAL_TABLET | Freq: Every day | ORAL | Status: DC
  Administered 2015-08-25 – 2015-08-27 (×3): 40 mg via ORAL
  Filled 2015-08-24 (×4): qty 2

## 2015-08-24 MED ORDER — GLIPIZIDE 5 MG PO TABS
5.0000 mg | ORAL_TABLET | Freq: Two times a day (BID) | ORAL | Status: DC
  Administered 2015-08-24 – 2015-08-25 (×2): 5 mg via ORAL
  Filled 2015-08-24 (×2): qty 1

## 2015-08-24 MED ORDER — POTASSIUM CHLORIDE CRYS ER 20 MEQ PO TBCR
20.0000 meq | EXTENDED_RELEASE_TABLET | Freq: Every day | ORAL | Status: DC
  Administered 2015-08-25 – 2015-08-27 (×3): 20 meq via ORAL
  Filled 2015-08-24 (×4): qty 1

## 2015-08-24 MED ORDER — PANTOPRAZOLE SODIUM 40 MG PO TBEC
40.0000 mg | DELAYED_RELEASE_TABLET | Freq: Two times a day (BID) | ORAL | Status: DC
  Administered 2015-08-24 – 2015-08-27 (×6): 40 mg via ORAL
  Filled 2015-08-24 (×6): qty 1

## 2015-08-24 MED ORDER — PRAVASTATIN SODIUM 10 MG PO TABS
10.0000 mg | ORAL_TABLET | Freq: Every day | ORAL | Status: DC
  Administered 2015-08-24 – 2015-08-26 (×3): 10 mg via ORAL
  Filled 2015-08-24 (×3): qty 1

## 2015-08-24 MED ORDER — METOPROLOL TARTRATE 25 MG PO TABS
12.5000 mg | ORAL_TABLET | Freq: Two times a day (BID) | ORAL | Status: DC
  Administered 2015-08-24 – 2015-08-27 (×6): 12.5 mg via ORAL
  Filled 2015-08-24 (×6): qty 1

## 2015-08-24 MED ORDER — ONDANSETRON HCL 4 MG PO TABS: 4.0000 mg | ORAL_TABLET | Freq: Four times a day (QID) | ORAL | Status: DC | PRN

## 2015-08-24 MED ORDER — ONDANSETRON HCL 4 MG/2ML IJ SOLN: 4.0000 mg | Freq: Four times a day (QID) | INTRAMUSCULAR | Status: DC | PRN

## 2015-08-24 MED ORDER — HEPARIN (PORCINE) IN NACL 100-0.45 UNIT/ML-% IJ SOLN
1300.0000 [IU]/h | INTRAMUSCULAR | Status: DC
  Administered 2015-08-24 – 2015-08-26 (×4): 1300 [IU]/h via INTRAVENOUS
  Filled 2015-08-24 (×6): qty 250

## 2015-08-24 NOTE — Progress Notes (Addendum)
ANTICOAGULATION CONSULT NOTE - Initial Consult  Pharmacy Consult for Heparin Indication: pulmonary embolus  Allergies  Allergen Reactions  . Contrast Media [Iodinated Diagnostic Agents] Nausea And Vomiting    Patient Measurements: Height: 6' (182.9 cm) Weight: 166 lb 1.6 oz (75.342 kg) IBW/kg (Calculated) : 77.6 Heparin Dosing Weight: 75.3 kg  Vital Signs: Temp: 98.3 F (36.8 C) (09/13 1640) BP: 123/84 mmHg (09/13 1640) Pulse Rate: 69 (09/13 1640)  Labs:  Recent Labs  08/24/15 1822  HGB 14.6  HCT 45.8  PLT 156    Estimated Creatinine Clearance: 50.8 mL/min (by C-G formula based on Cr of 1.38).   Medical History: Past Medical History  Diagnosis Date  . COPD (chronic obstructive pulmonary disease)   . Gastric ulcer   . DVT (deep venous thrombosis)   . PE (pulmonary embolism)   . Hypertension   . Hypercholesteremia   . Diabetes mellitus without complication   . GERD (gastroesophageal reflux disease)   . Atrial fibrillation   . Asthma     Medications:  Prescriptions prior to admission  Medication Sig Dispense Refill Last Dose  . albuterol (PROVENTIL) (2.5 MG/3ML) 0.083% nebulizer solution Take 3 mLs (2.5 mg total) by nebulization every 6 (six) hours as needed for wheezing or shortness of breath. 75 mL 12 Taking  . albuterol-ipratropium (COMBIVENT) 18-103 MCG/ACT inhaler Inhale 2 puffs into the lungs 4 (four) times daily.   Taking  . budesonide-formoterol (SYMBICORT) 160-4.5 MCG/ACT inhaler Inhale 2 puffs into the lungs 2 (two) times daily.   Taking  . cefUROXime (CEFTIN) 500 MG tablet Take 1 tablet (500 mg total) by mouth 2 (two) times daily with a meal. (Patient not taking: Reported on 08/16/2015) 16 tablet 0 Completed Course at Unknown time  . furosemide (LASIX) 40 MG tablet Take 1 tablet (40 mg total) by mouth 2 (two) times daily. (Patient taking differently: Take 20 mg by mouth 2 (two) times daily. ) 60 tablet 5 08/15/2015 at Unknown time  . glipiZIDE (GLUCOTROL)  5 MG tablet Take 5 mg by mouth 2 (two) times daily before a meal.   08/15/2015 at Unknown time  . lisinopril (PRINIVIL,ZESTRIL) 40 MG tablet Take 40 mg by mouth daily.   08/15/2015 at Unknown time  . lovastatin (MEVACOR) 40 MG tablet Take 40 mg by mouth daily with supper. Pt should take with food.   08/15/2015 at Unknown time  . metFORMIN (GLUMETZA) 500 MG (MOD) 24 hr tablet Take 500 mg by mouth 2 (two) times daily with a meal.   Taking  . metoprolol tartrate (LOPRESSOR) 25 MG tablet Take 12.5 mg by mouth 2 (two) times daily.    Not Taking at Unknown time  . nicotine (NICODERM CQ - DOSED IN MG/24 HOURS) 21 mg/24hr patch Place 1 patch (21 mg total) onto the skin daily. 28 patch 0 Taking  . nitroGLYCERIN (NITROSTAT) 0.4 MG SL tablet Place 0.4 mg under the tongue every 5 (five) minutes x 3 doses as needed for chest pain. If no relief call 911 or go to emergency room.   PRN at PRN  . omeprazole (PRILOSEC) 20 MG capsule Take 20 mg by mouth 2 (two) times daily.   08/15/2015 at Unknown time  . polyethylene glycol (MIRALAX) packet Take 17 g by mouth daily as needed for moderate constipation. 30 each 0   . potassium chloride SA (K-DUR,KLOR-CON) 20 MEQ tablet Take 1 tablet (20 mEq total) by mouth daily. 30 tablet 0 Taking  . predniSONE (DELTASONE) 5 MG tablet Take  1 tablet (5 mg total) by mouth daily with breakfast. 4 tabs day1; 3 tabs day2; 2 tabs day3; 1 tab day4,5 then stop 11 tablet 0 Taking  . tiotropium (SPIRIVA) 18 MCG inhalation capsule Place 1 capsule (18 mcg total) into inhaler and inhale daily. 30 capsule 0 Taking    Assessment: Pharmacy consulted to dose heparin in this 74 year old male with PE.  Goal of Therapy:  Heparin level 0.3-0.7 units/ml Monitor platelets by anticoagulation protocol: Yes   Plan:  Give 4000 units bolus x 1 Start heparin infusion at 1300 units/hr  Will order baseline aPTT. Will draw 1st HL 8 hrs after start of drip on 9/14 @ 4:00.   Austin [name] 08/24/2015,6:58 PM

## 2015-08-24 NOTE — H&P (Signed)
[facility] - [place] [facility]   PATIENT NAME: Austin [name]    MR#:  [id number]  DATE OF BIRTH:  02-05-1941  DATE OF ADMISSION:  08/24/2015  PRIMARY CARE PHYSICIAN: [facility]   REQUESTING/REFERRING PHYSICIAN: Dr. Wende Neighbors  CHIEF COMPLAINT:  No chief complaint on file.   HISTORY OF PRESENT ILLNESS: Austin [name]  is a 74 y.o. male with a known history of  COPD pulmonary embolism and DVT in the past was on Coumadin but this was discontinued due to a gastric ulcer. Patient was recently evaluated outpatient and was noted to have pulmonary hypertension and was referred to Dr. Reita Cliche. He initially went underwent a VQ scan which was abnormal. Patient subsequently had a CT per PE today which showed a acute and chronic pulmonary embolism and therefore he was referred by Dr. Reita Cliche office for admission. Patient has been on oxygen for a month and a half since she had a upper respiratory infection. He currently denies any shortness of breath or chest pain. He is mobile   PAST MEDICAL HISTORY:   Past Medical History  Diagnosis Date  . COPD (chronic obstructive pulmonary disease)   . Gastric ulcer   . DVT (deep venous thrombosis)   . PE (pulmonary embolism)   . Hypertension   . Hypercholesteremia   . Diabetes mellitus without complication   . GERD (gastroesophageal reflux disease)   . Atrial fibrillation   . Asthma     PAST SURGICAL HISTORY:  Past Surgical History  Procedure Laterality Date  . Hernia repair    . Prostate surgery      SOCIAL HISTORY:  Social History  Substance Use Topics  . Smoking status: Former Smoker -- 1.00 packs/day    Quit date: 07/04/2015  . Smokeless tobacco: Not on file  . Alcohol Use: No    FAMILY HISTORY:  Family History  Problem Relation Age of Onset  . CAD Brother   . Congestive Heart Failure Brother     DRUG ALLERGIES:  Allergies  Allergen Reactions  . Contrast Media [Iodinated  Diagnostic Agents] Nausea And Vomiting    REVIEW OF SYSTEMS:   CONSTITUTIONAL: No fever, fatigue or weakness.  EYES: No blurred or double vision.  EARS, NOSE, AND THROAT: No tinnitus or ear pain.  RESPIRATORY: No cough, shortness of breath, wheezing or hemoptysis.  CARDIOVASCULAR: No chest pain, orthopnea, edema.  GASTROINTESTINAL: No nausea, vomiting, diarrhea or abdominal pain.  GENITOURINARY: No dysuria, hematuria.  ENDOCRINE: No polyuria, nocturia,  HEMATOLOGY: No anemia, easy bruising or bleeding SKIN: No rash or lesion. MUSCULOSKELETAL: No joint pain or arthritis.   NEUROLOGIC: No tingling, numbness, weakness.  PSYCHIATRY: No anxiety or depression.   MEDICATIONS AT HOME:  Prior to Admission medications   Medication Sig Start Date End Date Taking? Authorizing Provider  albuterol (PROVENTIL) (2.5 MG/3ML) 0.083% nebulizer solution Take 3 mLs (2.5 mg total) by nebulization every 6 (six) hours as needed for wheezing or shortness of breath. 07/08/15   Alford Highland, MD  albuterol-ipratropium (COMBIVENT) 18-103 MCG/ACT inhaler Inhale 2 puffs into the lungs 4 (four) times daily.    Historical Provider, MD  budesonide-formoterol (SYMBICORT) 160-4.5 MCG/ACT inhaler Inhale 2 puffs into the lungs 2 (two) times daily.    Historical Provider, MD  cefUROXime (CEFTIN) 500 MG tablet Take 1 tablet (500 mg total) by mouth 2 (two) times daily with a meal. Patient not taking: Reported on 08/16/2015 07/08/15   Alford Highland, MD  furosemide (LASIX)  40 MG tablet Take 1 tablet (40 mg total) by mouth 2 (two) times daily. Patient taking differently: Take 20 mg by mouth 2 (two) times daily.  07/21/15   Delma Freeze, FNP  glipiZIDE (GLUCOTROL) 5 MG tablet Take 5 mg by mouth 2 (two) times daily before a meal.    Historical Provider, MD  lisinopril (PRINIVIL,ZESTRIL) 40 MG tablet Take 40 mg by mouth daily.    Historical Provider, MD  lovastatin (MEVACOR) 40 MG tablet Take 40 mg by mouth daily with supper. Pt  should take with food.    Historical Provider, MD  metFORMIN (GLUMETZA) 500 MG (MOD) 24 hr tablet Take 500 mg by mouth 2 (two) times daily with a meal.    Historical Provider, MD  metoprolol tartrate (LOPRESSOR) 25 MG tablet Take 12.5 mg by mouth 2 (two) times daily.     Historical Provider, MD  nicotine (NICODERM CQ - DOSED IN MG/24 HOURS) 21 mg/24hr patch Place 1 patch (21 mg total) onto the skin daily. 07/08/15   Alford Highland, MD  nitroGLYCERIN (NITROSTAT) 0.4 MG SL tablet Place 0.4 mg under the tongue every 5 (five) minutes x 3 doses as needed for chest pain. If no relief call 911 or go to emergency room.    Historical Provider, MD  omeprazole (PRILOSEC) 20 MG capsule Take 20 mg by mouth 2 (two) times daily.    Historical Provider, MD  polyethylene glycol (MIRALAX) packet Take 17 g by mouth daily as needed for moderate constipation. 08/17/15   Delfino Lovett, MD  potassium chloride SA (K-DUR,KLOR-CON) 20 MEQ tablet Take 1 tablet (20 mEq total) by mouth daily. 07/08/15   Alford Highland, MD  predniSONE (DELTASONE) 5 MG tablet Take 1 tablet (5 mg total) by mouth daily with breakfast. 4 tabs day1; 3 tabs day2; 2 tabs day3; 1 tab day4,5 then stop 07/08/15   Alford Highland, MD  tiotropium (SPIRIVA) 18 MCG inhalation capsule Place 1 capsule (18 mcg total) into inhaler and inhale daily. 07/08/15   Alford Highland, MD      PHYSICAL EXAMINATION:   VITAL SIGNS: Blood pressure 123/84, pulse 69, temperature 98.3 F (36.8 C), resp. rate 16, height 6' (1.829 m), weight 75.342 kg (166 lb 1.6 oz), SpO2 95 %.  GENERAL:  74 y.o.-year-old patient lying in the bed with no acute distress.  EYES: Pupils equal, round, reactive to light and accommodation. No scleral icterus. Extraocular muscles intact.  HEENT: Head atraumatic, normocephalic. Oropharynx and nasopharynx clear.  NECK:  Supple, no jugular venous distention. No thyroid enlargement, no tenderness.  LUNGS: Normal breath sounds bilaterally, no wheezing,  rales,rhonchi or crepitation. No use of accessory muscles of respiration.  CARDIOVASCULAR: S1, S2 normal. No murmurs, rubs, or gallops.  ABDOMEN: Soft, nontender, nondistended. Bowel sounds present. No organomegaly or mass.  EXTREMITIES: No pedal edema, cyanosis, or clubbing.  NEUROLOGIC: Cranial nerves II through XII are intact. Muscle strength 5/5 in all extremities. Sensation intact. Gait not checked.  PSYCHIATRIC: The patient is alert and oriented x 3.  SKIN: No obvious rash, lesion, or ulcer.   LABORATORY PANEL:   CBC No results for input(s): WBC, HGB, HCT, PLT, MCV, MCH, MCHC, RDW, LYMPHSABS, MONOABS, EOSABS, BASOSABS, BANDABS in the last 168 hours.  Invalid input(s): NEUTRABS, BANDSABD ------------------------------------------------------------------------------------------------------------------  Chemistries  No results for input(s): NA, K, CL, CO2, GLUCOSE, BUN, CREATININE, CALCIUM, MG, AST, ALT, ALKPHOS, BILITOT in the last 168 hours.  Invalid input(s): GFRCGP ------------------------------------------------------------------------------------------------------------------ estimated creatinine clearance is 50.8 mL/min (by C-G formula  based on Cr of 1.38). ------------------------------------------------------------------------------------------------------------------ No results for input(s): TSH, T4TOTAL, T3FREE, THYROIDAB in the last 72 hours.  Invalid input(s): FREET3   Coagulation profile No results for input(s): INR, PROTIME in the last 168 hours. ------------------------------------------------------------------------------------------------------------------- No results for input(s): DDIMER in the last 72 hours. -------------------------------------------------------------------------------------------------------------------  Cardiac Enzymes No results for input(s): CKMB, TROPONINI, MYOGLOBIN in the last 168 hours.  Invalid input(s):  CK ------------------------------------------------------------------------------------------------------------------ Invalid input(s): POCBNP  ---------------------------------------------------------------------------------------------------------------  Urinalysis    Component Value Date/Time   COLORURINE YELLOW* 04/11/2015 1225   COLORURINE Amber 03/19/2014 2110   APPEARANCEUR CLEAR* 04/11/2015 1225   APPEARANCEUR Hazy 03/19/2014 2110   LABSPEC 1.005 04/11/2015 1225   LABSPEC 1.018 03/19/2014 2110   PHURINE 7.0 04/11/2015 1225   PHURINE 5.0 03/19/2014 2110   GLUCOSEU NEGATIVE 04/11/2015 1225   GLUCOSEU Negative 03/19/2014 2110   HGBUR 3+* 04/11/2015 1225   HGBUR 2+ 03/19/2014 2110   BILIRUBINUR NEGATIVE 04/11/2015 1225   BILIRUBINUR Negative 03/19/2014 2110   KETONESUR NEGATIVE 04/11/2015 1225   KETONESUR Negative 03/19/2014 2110   PROTEINUR NEGATIVE 04/11/2015 1225   PROTEINUR 30 mg/dL 60/45/4098 1191   NITRITE NEGATIVE 04/11/2015 1225   NITRITE Negative 03/19/2014 2110   LEUKOCYTESUR NEGATIVE 04/11/2015 1225   LEUKOCYTESUR Negative 03/19/2014 2110     RADIOLOGY: Dg Chest 2 View  08/23/2015   CLINICAL DATA:  Pulmonary hypertension.  EXAM: CHEST  2 VIEW  COMPARISON:  07/05/2015.  FINDINGS: Mediastinum and hilar structures are normal. Cardiomegaly with normal pulmonary vascularity. Previously identified right base atelectasis/infiltrate and/or right pleural effusion has cleared. No acute bony abnormality.  IMPRESSION: 1. Interim clearing of previously identified right base atelectasis/infiltrates and/or right pleural effusion. No acute pulmonary abnormality identified. 2. Stable cardiomegaly.   Electronically Signed   By: Maisie Fus  Register   On: 08/23/2015 08:31   Ct Angio Chest Pe W/cm &/or Wo Cm  08/24/2015   CLINICAL DATA:  Chest pain, shortness of breath, nausea  EXAM: CT ANGIOGRAPHY CHEST WITH CONTRAST  TECHNIQUE: Multidetector CT imaging of the chest was performed using  the standard protocol during bolus administration of intravenous contrast. Multiplanar CT image reconstructions and MIPs were obtained to evaluate the vascular anatomy.  CONTRAST:  75mL OMNIPAQUE IOHEXOL 350 MG/ML SOLN  COMPARISON:  Chest x-ray of 08/23/2015 and CT chest of 11/24/2013  FINDINGS: The pulmonary arteries are well opacified. There are acute pulmonary emboli present primarily within the left pulmonary artery branch to the lingula as well as in the right main pulmonary artery. There is some webbing within branches to both lower lobes which may indicate chronic changes of pulmonary emboli as well with fibrosis. Therefore I believe this represents acute pulmonary emboli superimposed on chronic changes from prior pulmonary emboli. There is marked irregularity of the lumen of the right main pulmonary artery due to both chronic and acute disease. The RV/LV ratio is markedly elevated measuring 2.6. Positive for acute PE with CT evidence of right heart strain (RV/LV Ratio = 2.6) consistent with at least submassive (intermediate risk) PE. The presence of right heart strain has been associated with an increased risk of morbidity and mortality. The thoracic aorta is no no mediastinal adenopathy is noted. Coronary artery calcifications are present.  On lung window images, there are diffuse changes of centrilobular and paraseptal emphysema. A spiculated nodular lesion in the posterior inferior right upper lobe measuring 16 mm compared to 13 mm previously. This could represent a slow growing malignancy. In addition, there is a small similarly spiculated lesion within  the right middle lobe, measuring 11 mm compared to 7 mm on the prior CT, worrisome for either primary or metastatic disease. The central airway is patent. The thoracic vertebrae are in normal alignment with degenerative changes present.  Review of the MIP images confirms the above findings.  IMPRESSION: 1. Acute pulmonary emboli are noted superimposed on  chronic pulmonary emboli as described above. 2. Abnormal RV/LV ratio. Positive for acute PE with CTevidence of right heart strain (RV/LV Ratio = 2.6) consistent with at least submassive (intermediate risk) PE. The presence of right heart strain has been associated with an increased risk of morbidity and mortality. 3. Two spiculated lesions, 1 in the right upper lobe inferiorly and 1 in the right middle lobe worrisome for primary or metastatic disease. 4. Moderately severe centrilobular and paraseptal emphysema. Critical Value/emergent results were called by telephone at the time of interpretation on 08/24/2015 at 2:19 pm to Dr. Ned Clines , who verbally acknowledged these results.   Electronically Signed   By: Dwyane Dee M.D.   On: 08/24/2015 14:25   Nm Pulmonary Perf And Vent  08/23/2015   CLINICAL DATA:  Pulmonary hypertension.  EXAM: NUCLEAR MEDICINE VENTILATION - PERFUSION LUNG SCAN  TECHNIQUE: Ventilation images were obtained in multiple projections using inhaled aerosol Tc-81m DTPA. Perfusion images were obtained in multiple projections after intravenous injection of Tc-98m MAA.  RADIOPHARMACEUTICALS:  44.5 Technetium-39m DTPA aerosol inhalation and 4.4 Technetium-20m MAA IV  COMPARISON:  11/24/2013  FINDINGS: Remarkably heterogeneous lung perfusion with numerous small and at least 2 large segmental perfusion defects (posterior segment right upper lobe and lingula). The larger defects are matched. This is an intermediate probability pattern by PIOPED II. Appearance is stable from 2014. Ventilation is more heterogeneous than perfusion, with central deposition, correlating with advanced emphysema on 11/24/2013 chest CT.  IMPRESSION: Abnormal lung ventilation and perfusion, stable from 2014, at least partially from patient's emphysema. Superimposed chronic pulmonary embolism may be present.   Electronically Signed   By: Marnee Spring M.D.   On: 08/23/2015 09:49    EKG: Orders placed or performed  during the hospital encounter of 08/15/15  . ED EKG  . ED EKG  . EKG 12-Lead  . EKG 12-Lead  . EKG    IMPRESSION AND PLAN:  patient is a 74 year old with history of pulmonary embolism and DVT in the past was on Coumadin had to be stopped due to gastric ulcer now has recurrence of PE  1. Acute pulmonary embolism at this time we'll start him on heparin and then we can decide whether to use one of the newer agents. Patient is currently on PPI which we will continue. I will also Doppler his lower extremity  2.  Diabetes  He will  placed on sliding scale insulin. His metformin will be continued  3. COPD without any evidence of acute exasperation continue inhalers as taking at home  4. HTN continue lisinopril  5. Miscellaneous he'll be on heparin and PPI for GI prophylaxis     All the records are reviewed and case discussed with ED provider. Management plans discussed with the patient, family and they are in agreement.  CODE STATUS:    Code Status Orders        Start     Ordered   08/24/15 1742  Full code   Continuous     08/24/15 1742    Advance Directive Documentation        Most Recent Value   Type of Advance Directive  Healthcare Power of Attorney, Living will   Pre-existing out of facility DNR order (yellow form or pink MOST form)     "MOST" Form in Place?         TOTAL TIME TAKING CARE OF THIS PATIENT: 55 minutes.    Auburn Bilberry M.D on 08/24/2015 at 5:48 PM  Between 7am to 6pm - Pager - [phone]  After 6pm go to [email or web address] - password [email or web address] [facility]  [facility]  Office  [phone]  CC: Primary care physician; [facility]

## 2015-08-25 ENCOUNTER — Inpatient Hospital Stay: Payer: BLUE CROSS/BLUE SHIELD

## 2015-08-25 LAB — BASIC METABOLIC PANEL
ANION GAP: 5 (ref 5–15)
BUN: 19 mg/dL (ref 6–20)
CALCIUM: 10.5 mg/dL — AB (ref 8.9–10.3)
CO2: 26 mmol/L (ref 22–32)
Chloride: 108 mmol/L (ref 101–111)
Creatinine, Ser: 1.34 mg/dL — ABNORMAL HIGH (ref 0.61–1.24)
GFR calc Af Amer: 59 mL/min — ABNORMAL LOW (ref >60)
GFR, EST NON AFRICAN AMERICAN: 51 mL/min — AB (ref >60)
Glucose, Bld: 79 mg/dL (ref 65–99)
POTASSIUM: 4.6 mmol/L (ref 3.5–5.1)
SODIUM: 139 mmol/L (ref 135–145)

## 2015-08-25 LAB — CBC
HCT: 42.5 % (ref 40.0–52.0)
HEMOGLOBIN: 14.1 g/dL (ref 13.0–18.0)
MCH: 31.3 pg (ref 26.0–34.0)
MCHC: 33.2 g/dL (ref 32.0–36.0)
MCV: 94.2 fL (ref 80.0–100.0)
Platelets: 157 10*3/uL (ref 150–440)
RBC: 4.51 MIL/uL (ref 4.40–5.90)
RDW: 16.4 % — ABNORMAL HIGH (ref 11.5–14.5)
WBC: 4.7 10*3/uL (ref 3.8–10.6)

## 2015-08-25 LAB — HEPARIN LEVEL (UNFRACTIONATED)
Heparin Unfractionated: 0.62 IU/mL (ref 0.30–0.70)
Heparin Unfractionated: 0.68 IU/mL (ref 0.30–0.70)

## 2015-08-25 LAB — GLUCOSE, CAPILLARY
GLUCOSE-CAPILLARY: 61 mg/dL — AB (ref 65–99)
GLUCOSE-CAPILLARY: 171 mg/dL — AB (ref 65–99)
GLUCOSE-CAPILLARY: 109 mg/dL — AB (ref 65–99)
Glucose-Capillary: 115 mg/dL — ABNORMAL HIGH (ref 65–99)

## 2015-08-25 NOTE — Progress Notes (Signed)
ANTICOAGULATION CONSULT NOTE - Initial Consult  Pharmacy Consult for Heparin Indication: pulmonary embolus  Allergies  Allergen Reactions  . Contrast Media [Iodinated Diagnostic Agents] Nausea And Vomiting    Patient Measurements: Height: 6' (182.9 cm) Weight: 166 lb 1.6 oz (75.342 kg) IBW/kg (Calculated) : 77.6 Heparin Dosing Weight: 75.3 kg  Vital Signs: Temp: 97.4 F (36.3 C) (09/13 1945) Temp Source: Oral (09/13 1945) BP: 137/80 mmHg (09/13 1945) Pulse Rate: 77 (09/13 1945)  Labs:  Recent Labs  08/24/15 1822 08/24/15 1828 08/25/15 0407  HGB 14.6  --  14.1  HCT 45.8  --  42.5  PLT 156  --  157  APTT  --  31  --   LABPROT 15.7*  --   --   INR 1.23  --   --   HEPARINUNFRC  --   --  0.62  CREATININE 1.41*  --  1.34*    Estimated Creatinine Clearance: 52.3 mL/min (by C-G formula based on Cr of 1.34).   Medical History: Past Medical History  Diagnosis Date  . COPD (chronic obstructive pulmonary disease)   . Gastric ulcer   . DVT (deep venous thrombosis)   . PE (pulmonary embolism)   . Hypertension   . Hypercholesteremia   . Diabetes mellitus without complication   . GERD (gastroesophageal reflux disease)   . Atrial fibrillation   . Asthma     Medications:  Prescriptions prior to admission  Medication Sig Dispense Refill Last Dose  . albuterol (PROVENTIL) (2.5 MG/3ML) 0.083% nebulizer solution Take 3 mLs (2.5 mg total) by nebulization every 6 (six) hours as needed for wheezing or shortness of breath. 75 mL 12 Taking  . albuterol-ipratropium (COMBIVENT) 18-103 MCG/ACT inhaler Inhale 2 puffs into the lungs 4 (four) times daily.   Taking  . budesonide-formoterol (SYMBICORT) 160-4.5 MCG/ACT inhaler Inhale 2 puffs into the lungs 2 (two) times daily.   Taking  . cefUROXime (CEFTIN) 500 MG tablet Take 1 tablet (500 mg total) by mouth 2 (two) times daily with a meal. (Patient not taking: Reported on 08/16/2015) 16 tablet 0 Completed Course at Unknown time  .  furosemide (LASIX) 40 MG tablet Take 1 tablet (40 mg total) by mouth 2 (two) times daily. (Patient taking differently: Take 20 mg by mouth 2 (two) times daily. ) 60 tablet 5 08/15/2015 at Unknown time  . glipiZIDE (GLUCOTROL) 5 MG tablet Take 5 mg by mouth 2 (two) times daily before a meal.   08/15/2015 at Unknown time  . lisinopril (PRINIVIL,ZESTRIL) 40 MG tablet Take 40 mg by mouth daily.   08/15/2015 at Unknown time  . lovastatin (MEVACOR) 40 MG tablet Take 40 mg by mouth daily with supper. Pt should take with food.   08/15/2015 at Unknown time  . metFORMIN (GLUMETZA) 500 MG (MOD) 24 hr tablet Take 500 mg by mouth 2 (two) times daily with a meal.   Taking  . metoprolol tartrate (LOPRESSOR) 25 MG tablet Take 12.5 mg by mouth 2 (two) times daily.    Not Taking at Unknown time  . nicotine (NICODERM CQ - DOSED IN MG/24 HOURS) 21 mg/24hr patch Place 1 patch (21 mg total) onto the skin daily. 28 patch 0 Taking  . nitroGLYCERIN (NITROSTAT) 0.4 MG SL tablet Place 0.4 mg under the tongue every 5 (five) minutes x 3 doses as needed for chest pain. If no relief call 911 or go to emergency room.   PRN at PRN  . omeprazole (PRILOSEC) 20 MG capsule Take 20  mg by mouth 2 (two) times daily.   08/15/2015 at Unknown time  . polyethylene glycol (MIRALAX) packet Take 17 g by mouth daily as needed for moderate constipation. 30 each 0   . potassium chloride SA (K-DUR,KLOR-CON) 20 MEQ tablet Take 1 tablet (20 mEq total) by mouth daily. 30 tablet 0 Taking  . predniSONE (DELTASONE) 5 MG tablet Take 1 tablet (5 mg total) by mouth daily with breakfast. 4 tabs day1; 3 tabs day2; 2 tabs day3; 1 tab day4,5 then stop 11 tablet 0 Taking  . tiotropium (SPIRIVA) 18 MCG inhalation capsule Place 1 capsule (18 mcg total) into inhaler and inhale daily. 30 capsule 0 Taking    Assessment: Pharmacy consulted to dose heparin in this 74 year old male with PE.  Goal of Therapy:  Heparin level 0.3-0.7 units/ml Monitor platelets by anticoagulation  protocol: Yes   Plan:  Give 4000 units bolus x 1 Start heparin infusion at 1300 units/hr  Will order baseline aPTT. Will draw 1st HL 8 hrs after start of drip on 9/14 @ 4:00.   0914 [id number] heparin level therapeutic. Will order confirmatory level in 8 hours.  Carola Frost, Pharm.D. Clinical Pharmacist 08/25/2015,5:03 AM

## 2015-08-25 NOTE — Progress Notes (Signed)
ANTICOAGULATION CONSULT NOTE - Initial Consult  Pharmacy Consult for Heparin Indication: pulmonary embolus  Allergies  Allergen Reactions  . Contrast Media [Iodinated Diagnostic Agents] Nausea And Vomiting    Patient Measurements: Height: 6' (182.9 cm) Weight: 166 lb 1.6 oz (75.342 kg) IBW/kg (Calculated) : 77.6 Heparin Dosing Weight: 75.3 kg  Vital Signs: Temp: 98.1 F (36.7 C) (09/14 1152) Temp Source: Oral (09/14 0542) BP: 110/63 mmHg (09/14 1152) Pulse Rate: 68 (09/14 0542)  Labs:  Recent Labs  08/24/15 1822 08/24/15 1828 08/25/15 0407 08/25/15 1259  HGB 14.6  --  14.1  --   HCT 45.8  --  42.5  --   PLT 156  --  157  --   APTT  --  31  --   --   LABPROT 15.7*  --   --   --   INR 1.23  --   --   --   HEPARINUNFRC  --   --  0.62 0.68  CREATININE 1.41*  --  1.34*  --     Estimated Creatinine Clearance: 52.3 mL/min (by C-G formula based on Cr of 1.34).   Medical History: Past Medical History  Diagnosis Date  . COPD (chronic obstructive pulmonary disease)   . Gastric ulcer   . DVT (deep venous thrombosis)   . PE (pulmonary embolism)   . Hypertension   . Hypercholesteremia   . Diabetes mellitus without complication   . GERD (gastroesophageal reflux disease)   . Atrial fibrillation   . Asthma     Medications:  Prescriptions prior to admission  Medication Sig Dispense Refill Last Dose  . albuterol (PROVENTIL) (2.5 MG/3ML) 0.083% nebulizer solution Take 3 mLs (2.5 mg total) by nebulization every 6 (six) hours as needed for wheezing or shortness of breath. 75 mL 12 Taking  . albuterol-ipratropium (COMBIVENT) 18-103 MCG/ACT inhaler Inhale 2 puffs into the lungs 4 (four) times daily.   Taking  . budesonide-formoterol (SYMBICORT) 160-4.5 MCG/ACT inhaler Inhale 2 puffs into the lungs 2 (two) times daily.   Taking  . cefUROXime (CEFTIN) 500 MG tablet Take 1 tablet (500 mg total) by mouth 2 (two) times daily with a meal. (Patient not taking: Reported on 08/16/2015)  16 tablet 0 Completed Course at Unknown time  . furosemide (LASIX) 40 MG tablet Take 1 tablet (40 mg total) by mouth 2 (two) times daily. (Patient taking differently: Take 20 mg by mouth 2 (two) times daily. ) 60 tablet 5 08/15/2015 at Unknown time  . glipiZIDE (GLUCOTROL) 5 MG tablet Take 5 mg by mouth 2 (two) times daily before a meal.   08/15/2015 at Unknown time  . lisinopril (PRINIVIL,ZESTRIL) 40 MG tablet Take 40 mg by mouth daily.   08/15/2015 at Unknown time  . lovastatin (MEVACOR) 40 MG tablet Take 40 mg by mouth daily with supper. Pt should take with food.   08/15/2015 at Unknown time  . metFORMIN (GLUMETZA) 500 MG (MOD) 24 hr tablet Take 500 mg by mouth 2 (two) times daily with a meal.   Taking  . metoprolol tartrate (LOPRESSOR) 25 MG tablet Take 12.5 mg by mouth 2 (two) times daily.    Not Taking at Unknown time  . nicotine (NICODERM CQ - DOSED IN MG/24 HOURS) 21 mg/24hr patch Place 1 patch (21 mg total) onto the skin daily. 28 patch 0 Taking  . nitroGLYCERIN (NITROSTAT) 0.4 MG SL tablet Place 0.4 mg under the tongue every 5 (five) minutes x 3 doses as needed for chest pain.  If no relief call 911 or go to emergency room.   PRN at PRN  . omeprazole (PRILOSEC) 20 MG capsule Take 20 mg by mouth 2 (two) times daily.   08/15/2015 at Unknown time  . polyethylene glycol (MIRALAX) packet Take 17 g by mouth daily as needed for moderate constipation. 30 each 0   . potassium chloride SA (K-DUR,KLOR-CON) 20 MEQ tablet Take 1 tablet (20 mEq total) by mouth daily. 30 tablet 0 Taking  . predniSONE (DELTASONE) 5 MG tablet Take 1 tablet (5 mg total) by mouth daily with breakfast. 4 tabs day1; 3 tabs day2; 2 tabs day3; 1 tab day4,5 then stop 11 tablet 0 Taking  . tiotropium (SPIRIVA) 18 MCG inhalation capsule Place 1 capsule (18 mcg total) into inhaler and inhale daily. 30 capsule 0 Taking    Assessment: Pharmacy consulted to dose heparin in this 74 year old male with PE.  Goal of Therapy:  Heparin level 0.3-0.7  units/ml Monitor platelets by anticoagulation protocol: Yes   Plan:  Give 4000 units bolus x 1 Start heparin infusion at 1300 units/hr  Will order baseline aPTT. Will draw 1st HL 8 hrs after start of drip on 9/14 @ 4:00.   0914 [id number] heparin level therapeutic. Will order confirmatory level in 8 hours.  0914 1259 HL therapeutic .68 This is the second therapeutic level. Will recheck in AM along with CBC  Tryston Gilliam D Pressley Tadesse, Pharm.D. Clinical Pharmacist 08/25/2015,2:23 PM

## 2015-08-25 NOTE — Progress Notes (Signed)
Inpatient Diabetes Program Recommendations  AACE/ADA: New Consensus Statement on Inpatient Glycemic Control (2015)  Target Ranges:  Prepandial:   less than 140 mg/dL      Peak postprandial:   less than 180 mg/dL (1-2 hours)      Critically ill patients:  140 - 180 mg/dL   Results for Austin, [name] (MRN [id number]) as of 08/25/2015 10:32  Ref. Range 08/24/2015 19:59 08/25/2015 07:29  Glucose-Capillary Latest Ref Range: 65-99 mg/dL 782 (H) 61 (L)   Results for Austin, [name] (MRN [id number]) as of 08/25/2015 10:32  Ref. Range 07/04/2015 16:36  Hemoglobin A1C Latest Ref Range: 4.0-6.0 % 6.8 (H)   Review of Glycemic Control  Diabetes history: DM2 Outpatient Diabetes medications: Glipizide 5 mg BID, Metformin 500 mg BID Current orders for Inpatient glycemic control: Glipizide 5 mg BID, Metformin XR 500 mg BID, Novolog 0-9 units TID with meals  Inpatient Diabetes Program Recommendations: Oral Agents: Fasting glucose 61 mg/dl this morning. May want to consider decreasing Glipizide to 5 mg QAM to prevent further eipsodes of hypoglycemia.  Thanks, Orlando Penner, RN, MSN, CCRN, CDE Diabetes Coordinator Inpatient Diabetes Program [phone] (Team Pager from 8am to 5pm) [phone] ([facility] office) [phone] [facility] office) [phone] [facility] office)

## 2015-08-25 NOTE — Progress Notes (Signed)
[facility] [place] [facility]   PATIENT NAME: Austin [name]    MR#:  [id number]  DATE OF BIRTH:  1941-02-19  SUBJECTIVE:  CHIEF COMPLAINT:  Pt is resting comfortably. Denies any chest pain or sob  REVIEW OF SYSTEMS:  CONSTITUTIONAL: No fever, fatigue or weakness.  EYES: No blurred or double vision.  EARS, NOSE, AND THROAT: No tinnitus or ear pain.  RESPIRATORY: No cough, shortness of breath, wheezing or hemoptysis.  CARDIOVASCULAR: No chest pain, orthopnea, edema.  GASTROINTESTINAL: No nausea, vomiting, diarrhea or abdominal pain.  GENITOURINARY: No dysuria, hematuria.  ENDOCRINE: No polyuria, nocturia,  HEMATOLOGY: No anemia, easy bruising or bleeding SKIN: No rash or lesion. MUSCULOSKELETAL: No joint pain or arthritis.   NEUROLOGIC: No tingling, numbness, weakness.  PSYCHIATRY: No anxiety or depression.   DRUG ALLERGIES:   Allergies  Allergen Reactions  . Contrast Media [Iodinated Diagnostic Agents] Nausea And Vomiting    VITALS:  Blood pressure 110/63, pulse 68, temperature 98.1 F (36.7 C), temperature source Oral, resp. rate 17, height 6' (1.829 m), weight 75.342 kg (166 lb 1.6 oz), SpO2 100 %.  PHYSICAL EXAMINATION:  GENERAL:  74 y.o.-year-old patient lying in the bed with no acute distress.  EYES: Pupils equal, round, reactive to light and accommodation. No scleral icterus. Extraocular muscles intact.  HEENT: Head atraumatic, normocephalic. Oropharynx and nasopharynx clear.  NECK:  Supple, no jugular venous distention. No thyroid enlargement, no tenderness.  LUNGS: Normal breath sounds bilaterally, no wheezing, rales,rhonchi or crepitation. No use of accessory muscles of respiration.  CARDIOVASCULAR: S1, S2 normal. No murmurs, rubs, or gallops.  ABDOMEN: Soft, nontender, nondistended. Bowel sounds present. No organomegaly or mass.  EXTREMITIES: No pedal edema, cyanosis, or clubbing.  NEUROLOGIC: Cranial nerves II through XII are  intact. Muscle strength 5/5 in all extremities. Sensation intact. Gait not checked.  PSYCHIATRIC: The patient is alert and oriented x 3.  SKIN: No obvious rash, lesion, or ulcer.    LABORATORY PANEL:   CBC  Recent Labs Lab 08/25/15 0407  WBC 4.7  HGB 14.1  HCT 42.5  PLT 157   ------------------------------------------------------------------------------------------------------------------  Chemistries   Recent Labs Lab 08/25/15 0407  NA 139  K 4.6  CL 108  CO2 26  GLUCOSE 79  BUN 19  CREATININE 1.34*  CALCIUM 10.5*   ------------------------------------------------------------------------------------------------------------------  Cardiac Enzymes No results for input(s): TROPONINI in the last 168 hours. ------------------------------------------------------------------------------------------------------------------  RADIOLOGY:  Ct Angio Chest Pe W/cm &/or Wo Cm  08/24/2015   CLINICAL DATA:  Chest pain, shortness of breath, nausea  EXAM: CT ANGIOGRAPHY CHEST WITH CONTRAST  TECHNIQUE: Multidetector CT imaging of the chest was performed using the standard protocol during bolus administration of intravenous contrast. Multiplanar CT image reconstructions and MIPs were obtained to evaluate the vascular anatomy.  CONTRAST:  75mL OMNIPAQUE IOHEXOL 350 MG/ML SOLN  COMPARISON:  Chest x-ray of 08/23/2015 and CT chest of 11/24/2013  FINDINGS: The pulmonary arteries are well opacified. There are acute pulmonary emboli present primarily within the left pulmonary artery branch to the lingula as well as in the right main pulmonary artery. There is some webbing within branches to both lower lobes which may indicate chronic changes of pulmonary emboli as well with fibrosis. Therefore I believe this represents acute pulmonary emboli superimposed on chronic changes from prior pulmonary emboli. There is marked irregularity of the lumen of the right main pulmonary artery due to both chronic and  acute disease. The RV/LV ratio is markedly elevated measuring 2.6.  Positive for acute PE with CT evidence of right heart strain (RV/LV Ratio = 2.6) consistent with at least submassive (intermediate risk) PE. The presence of right heart strain has been associated with an increased risk of morbidity and mortality. The thoracic aorta is no no mediastinal adenopathy is noted. Coronary artery calcifications are present.  On lung window images, there are diffuse changes of centrilobular and paraseptal emphysema. A spiculated nodular lesion in the posterior inferior right upper lobe measuring 16 mm compared to 13 mm previously. This could represent a slow growing malignancy. In addition, there is a small similarly spiculated lesion within the right middle lobe, measuring 11 mm compared to 7 mm on the prior CT, worrisome for either primary or metastatic disease. The central airway is patent. The thoracic vertebrae are in normal alignment with degenerative changes present.  Review of the MIP images confirms the above findings.  IMPRESSION: 1. Acute pulmonary emboli are noted superimposed on chronic pulmonary emboli as described above. 2. Abnormal RV/LV ratio. Positive for acute PE with CTevidence of right heart strain (RV/LV Ratio = 2.6) consistent with at least submassive (intermediate risk) PE. The presence of right heart strain has been associated with an increased risk of morbidity and mortality. 3. Two spiculated lesions, 1 in the right upper lobe inferiorly and 1 in the right middle lobe worrisome for primary or metastatic disease. 4. Moderately severe centrilobular and paraseptal emphysema. Critical Value/emergent results were called by telephone at the time of interpretation on 08/24/2015 at 2:19 pm to Dr. Ned Clines , who verbally acknowledged these results.   Electronically Signed   By: Dwyane Dee M.D.   On: 08/24/2015 14:25   US Venous Img Lower Bilateral  08/25/2015   CLINICAL DATA:  74 year old male with  a history of swelling for a week.  EXAM: BILATERAL LOWER EXTREMITY VENOUS DOPPLER ULTRASOUND  TECHNIQUE: Gray-scale sonography with graded compression, as well as color Doppler and duplex ultrasound were performed to evaluate the lower extremity deep venous systems from the level of the common femoral vein and including the common femoral, femoral, profunda femoral, popliteal and calf veins including the posterior tibial, peroneal and gastrocnemius veins when visible. The superficial great saphenous vein was also interrogated. Spectral Doppler was utilized to evaluate flow at rest and with distal augmentation maneuvers in the common femoral, femoral and popliteal veins.  COMPARISON:  Duplex 04/11/2015  FINDINGS: RIGHT LOWER EXTREMITY  Common Femoral Vein: No evidence of thrombus. Normal compressibility, respiratory phasicity and response to augmentation.  Saphenofemoral Junction: No evidence of thrombus. Normal compressibility and flow on color Doppler imaging.  Profunda Femoral Vein: No evidence of thrombus. Normal compressibility and flow on color Doppler imaging.  Femoral Vein: No evidence of thrombus. Normal compressibility, respiratory phasicity and response to augmentation.  Popliteal Vein: Nonocclusive thrombus of the right popliteal vein which was present on the comparison of May 2016  Calf Veins: No evidence of thrombus. Normal compressibility and flow on color Doppler imaging.  Superficial Great Saphenous Vein: No evidence of thrombus. Normal compressibility and flow on color Doppler imaging.  Other Findings:  None.  LEFT LOWER EXTREMITY  Common Femoral Vein: No evidence of thrombus. Normal compressibility, respiratory phasicity and response to augmentation.  Saphenofemoral Junction: No evidence of thrombus. Normal compressibility and flow on color Doppler imaging.  Profunda Femoral Vein: No evidence of thrombus. Normal compressibility and flow on color Doppler imaging.  Femoral Vein: No evidence of  thrombus. Normal compressibility, respiratory phasicity and response to augmentation.  Popliteal Vein: No evidence of thrombus. Normal compressibility, respiratory phasicity and response to augmentation.  Calf Veins: No evidence of thrombus. Normal compressibility and flow on color Doppler imaging.  Superficial Great Saphenous Vein: No evidence of thrombus. Normal compressibility and flow on color Doppler imaging.  Other Findings:  None.  IMPRESSION: Sonographic survey negative for acute DVT of the bilateral lower extremity.  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  Signed,  Yvone Neu. Loreta Ave, DO  [facility]  [facility]   Electronically Signed   By: Gilmer Mor D.O.   On: 08/25/2015 09:48    EKG:   Orders placed or performed during the hospital encounter of 08/15/15  . ED EKG  . ED EKG  . EKG 12-Lead  . EKG 12-Lead  . 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 Acute on chronic pulmonary embolism  Continue on heparin gttt and then we can decide whether to use one of the PO newer agents.  Patient is currently on PPI .   Dopplers of his lower extremity neg for acute DVT but reveals ch Rt popliteal dvt  2. Diabetes He will placed on sliding scale insulin. His metformin will be continued  3. COPD without any evidence of acute exasperation continue inhalers as taking at home  4. HTN continue lisinopril  5. Miscellaneous he'll be on heparin and PPI for GI prophylaxis     All the records are reviewed and case discussed with Care Management/Social Workerr. Management plans discussed with the patient, family and they are in agreement.  CODE STATUS: full  TOTAL TIME TAKING CARE OF THIS PATIENT: 35 minutes.   POSSIBLE D/C IN  2-3 DAYS, DEPENDING ON CLINICAL CONDITION.   Ramonita Lab M.D on 08/25/2015 at 5:50 PM  Between 7am to 6pm - Pager - [phone] After 6pm go to [email or web address] - password [email or web address] [facility]  [facility]  Office  [phone]  CC: Primary care physician; [facility]

## 2015-08-26 LAB — CBC
HCT: 43.1 % (ref 40.0–52.0)
HEMOGLOBIN: 13.8 g/dL (ref 13.0–18.0)
MCH: 30.2 pg (ref 26.0–34.0)
MCHC: 32 g/dL (ref 32.0–36.0)
MCV: 94.5 fL (ref 80.0–100.0)
Platelets: 154 10*3/uL (ref 150–440)
RBC: 4.56 MIL/uL (ref 4.40–5.90)
RDW: 16.9 % — ABNORMAL HIGH (ref 11.5–14.5)
WBC: 4.9 10*3/uL (ref 3.8–10.6)

## 2015-08-26 LAB — GLUCOSE, CAPILLARY
GLUCOSE-CAPILLARY: 61 mg/dL — AB (ref 65–99)
GLUCOSE-CAPILLARY: 147 mg/dL — AB (ref 65–99)
Glucose-Capillary: 45 mg/dL — ABNORMAL LOW (ref 65–99)
Glucose-Capillary: 94 mg/dL (ref 65–99)
Glucose-Capillary: 124 mg/dL — ABNORMAL HIGH (ref 65–99)
Glucose-Capillary: 136 mg/dL — ABNORMAL HIGH (ref 65–99)

## 2015-08-26 LAB — HEPARIN LEVEL (UNFRACTIONATED): HEPARIN UNFRACTIONATED: 0.61 [IU]/mL (ref 0.30–0.70)

## 2015-08-26 MED ORDER — APIXABAN 5 MG PO TABS
10.0000 mg | ORAL_TABLET | Freq: Two times a day (BID) | ORAL | Status: DC
  Administered 2015-08-26 – 2015-08-27 (×3): 10 mg via ORAL
  Filled 2015-08-26 (×3): qty 2

## 2015-08-26 MED ORDER — DEXTROSE 50 % IV SOLN
1.0000 | Freq: Once | INTRAVENOUS | Status: AC
  Administered 2015-08-26: 50 mL via INTRAVENOUS

## 2015-08-26 MED ORDER — APIXABAN 5 MG PO TABS: 5.0000 mg | ORAL_TABLET | Freq: Two times a day (BID) | ORAL | Status: DC

## 2015-08-26 MED ORDER — DEXTROSE 50 % IV SOLN
INTRAVENOUS | Status: AC
  Administered 2015-08-26: 09:00:00
  Filled 2015-08-26: qty 50

## 2015-08-26 MED ORDER — METFORMIN HCL 500 MG PO TABS
500.0000 mg | ORAL_TABLET | Freq: Two times a day (BID) | ORAL | Status: DC
  Administered 2015-08-26 – 2015-08-27 (×2): 500 mg via ORAL
  Filled 2015-08-26 (×2): qty 1

## 2015-08-26 NOTE — Progress Notes (Signed)
FSBS 45, pt states "feels fine"

## 2015-08-26 NOTE — Progress Notes (Signed)
Repeat FSBS 94, pt ate all of breakfast.

## 2015-08-26 NOTE — Discharge Instructions (Addendum)
Heart Failure Clinic appointment on August 31, 2015 at 1:00pm with Clarisa Kindred, FNP. Please call [phone] to reschedule.    Information on my medicine - ELIQUIS (apixaban)  This medication education was reviewed with me or my healthcare representative as part of my discharge preparation.  The pharmacist that spoke with me during my hospital stay was:  Olene Floss, [facility]  Why was Eliquis prescribed for you? Eliquis was prescribed to treat blood clots that may have been found in the veins of your legs (deep vein thrombosis) or in your lungs (pulmonary embolism) and to reduce the risk of them occurring again.  What do You need to know about Eliquis ? The starting dose is 10 mg (two 5 mg tablets) taken TWICE daily for the FIRST SEVEN (7) DAYS, then on (enter date)  09/02/15  the dose is reduced to ONE 5 mg tablet taken TWICE daily.  Eliquis may be taken with or without food.   Try to take the dose about the same time in the morning and in the evening. If you have difficulty swallowing the tablet whole please discuss with your pharmacist how to take the medication safely.  Take Eliquis exactly as prescribed and DO NOT stop taking Eliquis without talking to the doctor who prescribed the medication.  Stopping may increase your risk of developing a new blood clot.  Refill your prescription before you run out.  After discharge, you should have regular check-up appointments with your healthcare provider that is prescribing your Eliquis.    What do you do if you miss a dose? If a dose of ELIQUIS is not taken at the scheduled time, take it as soon as possible on the same day and twice-daily administration should be resumed. The dose should not be doubled to make up for a missed dose.  Important Safety Information A possible side effect of Eliquis is bleeding. You should call your healthcare provider right away if you experience any of the following: ? Bleeding from an injury or your  nose that does not stop. ? Unusual colored urine (red or dark brown) or unusual colored stools (red or black). ? Unusual bruising for unknown reasons. ? A serious fall or if you hit your head (even if there is no bleeding).  Some medicines may interact with Eliquis and might increase your risk of bleeding or clotting while on Eliquis. To help avoid this, consult your healthcare provider or pharmacist prior to using any new prescription or non-prescription medications, including herbals, vitamins, non-steroidal anti-inflammatory drugs (NSAIDs) and supplements.  This website has more information on Eliquis (apixaban): http://www.eliquis.com/eliquis/home Information on my medicine - ELIQUIS (apixaban)

## 2015-08-26 NOTE — Progress Notes (Signed)
ANTICOAGULATION CONSULT NOTE - Initial Consult  Pharmacy Consult for Heparin Indication: pulmonary embolus  Allergies  Allergen Reactions  . Contrast Media [Iodinated Diagnostic Agents] Nausea And Vomiting    Patient Measurements: Height: 6' (182.9 cm) Weight: 166 lb 1.6 oz (75.342 kg) IBW/kg (Calculated) : 77.6 Heparin Dosing Weight: 75.3 kg  Vital Signs: Temp: 98.6 F (37 C) (09/15 0450) BP: 131/82 mmHg (09/15 0450) Pulse Rate: 70 (09/15 0450)  Labs:  Recent Labs  08/24/15 1822 08/24/15 1828 08/25/15 0407 08/25/15 1259 08/26/15 0504  HGB 14.6  --  14.1  --  13.8  HCT 45.8  --  42.5  --  43.1  PLT 156  --  157  --  154  APTT  --  31  --   --   --   LABPROT 15.7*  --   --   --   --   INR 1.23  --   --   --   --   HEPARINUNFRC  --   --  0.62 0.68 0.61  CREATININE 1.41*  --  1.34*  --   --     Estimated Creatinine Clearance: 52.3 mL/min (by C-G formula based on Cr of 1.34).   Medical History: Past Medical History  Diagnosis Date  . COPD (chronic obstructive pulmonary disease)   . Gastric ulcer   . DVT (deep venous thrombosis)   . PE (pulmonary embolism)   . Hypertension   . Hypercholesteremia   . Diabetes mellitus without complication   . GERD (gastroesophageal reflux disease)   . Atrial fibrillation   . Asthma     Medications:  Prescriptions prior to admission  Medication Sig Dispense Refill Last Dose  . albuterol (PROVENTIL) (2.5 MG/3ML) 0.083% nebulizer solution Take 3 mLs (2.5 mg total) by nebulization every 6 (six) hours as needed for wheezing or shortness of breath. 75 mL 12 Taking  . albuterol-ipratropium (COMBIVENT) 18-103 MCG/ACT inhaler Inhale 2 puffs into the lungs 4 (four) times daily.   Taking  . budesonide-formoterol (SYMBICORT) 160-4.5 MCG/ACT inhaler Inhale 2 puffs into the lungs 2 (two) times daily.   Taking  . cefUROXime (CEFTIN) 500 MG tablet Take 1 tablet (500 mg total) by mouth 2 (two) times daily with a meal. (Patient not taking:  Reported on 08/16/2015) 16 tablet 0 Completed Course at Unknown time  . furosemide (LASIX) 40 MG tablet Take 1 tablet (40 mg total) by mouth 2 (two) times daily. (Patient taking differently: Take 20 mg by mouth 2 (two) times daily. ) 60 tablet 5 08/15/2015 at Unknown time  . glipiZIDE (GLUCOTROL) 5 MG tablet Take 5 mg by mouth 2 (two) times daily before a meal.   08/15/2015 at Unknown time  . lisinopril (PRINIVIL,ZESTRIL) 40 MG tablet Take 40 mg by mouth daily.   08/15/2015 at Unknown time  . lovastatin (MEVACOR) 40 MG tablet Take 40 mg by mouth daily with supper. Pt should take with food.   08/15/2015 at Unknown time  . metFORMIN (GLUMETZA) 500 MG (MOD) 24 hr tablet Take 500 mg by mouth 2 (two) times daily with a meal.   Taking  . metoprolol tartrate (LOPRESSOR) 25 MG tablet Take 12.5 mg by mouth 2 (two) times daily.    Not Taking at Unknown time  . nicotine (NICODERM CQ - DOSED IN MG/24 HOURS) 21 mg/24hr patch Place 1 patch (21 mg total) onto the skin daily. 28 patch 0 Taking  . nitroGLYCERIN (NITROSTAT) 0.4 MG SL tablet Place 0.4 mg under the  tongue every 5 (five) minutes x 3 doses as needed for chest pain. If no relief call 911 or go to emergency room.   PRN at PRN  . omeprazole (PRILOSEC) 20 MG capsule Take 20 mg by mouth 2 (two) times daily.   08/15/2015 at Unknown time  . polyethylene glycol (MIRALAX) packet Take 17 g by mouth daily as needed for moderate constipation. 30 each 0   . potassium chloride SA (K-DUR,KLOR-CON) 20 MEQ tablet Take 1 tablet (20 mEq total) by mouth daily. 30 tablet 0 Taking  . predniSONE (DELTASONE) 5 MG tablet Take 1 tablet (5 mg total) by mouth daily with breakfast. 4 tabs day1; 3 tabs day2; 2 tabs day3; 1 tab day4,5 then stop 11 tablet 0 Taking  . tiotropium (SPIRIVA) 18 MCG inhalation capsule Place 1 capsule (18 mcg total) into inhaler and inhale daily. 30 capsule 0 Taking    Assessment: Pharmacy consulted to dose heparin in this 74 year old male with PE.  Goal of Therapy:   Heparin level 0.3-0.7 units/ml Monitor platelets by anticoagulation protocol: Yes   Plan:  Give 4000 units bolus x 1 Start heparin infusion at 1300 units/hr  Will order baseline aPTT. Will draw 1st HL 8 hrs after start of drip on 9/14 @ 4:00.   0914 0407 heparin level therapeutic. Will order confirmatory level in 8 hours.  0914 1259 HL therapeutic .68 This is the second therapeutic level. Will recheck in AM along with CBC  0915 0504 HL therapeutic. Will continue current rate and switch to daily monitoring.   Carola Frost, Pharm.D. Clinical Pharmacist 08/26/2015,5:59 AM

## 2015-08-26 NOTE — Consult Note (Signed)
ANTICOAGULATION CONSULT NOTE - Initial Consult  Pharmacy Consult for apixaban Indication: pulmonary embolus  Allergies  Allergen Reactions  . Contrast Media [Iodinated Diagnostic Agents] Nausea And Vomiting    Patient Measurements: Height: 6' (182.9 cm) Weight: 166 lb 1.6 oz (75.342 kg) IBW/kg (Calculated) : 77.6 Heparin Dosing Weight:   Vital Signs: Temp: 97.8 F (36.6 C) (09/15 0727) Temp Source: Oral (09/15 0727) BP: 141/94 mmHg (09/15 0727) Pulse Rate: 67 (09/15 0727)  Labs:  Recent Labs  08/24/15 1822 08/24/15 1828 08/25/15 0407 08/25/15 1259 08/26/15 0504  HGB 14.6  --  14.1  --  13.8  HCT 45.8  --  42.5  --  43.1  PLT 156  --  157  --  154  APTT  --  31  --   --   --   LABPROT 15.7*  --   --   --   --   INR 1.23  --   --   --   --   HEPARINUNFRC  --   --  0.62 0.68 0.61  CREATININE 1.41*  --  1.34*  --   --     Estimated Creatinine Clearance: 52.3 mL/min (by C-G formula based on Cr of 1.34).   Medical History: Past Medical History  Diagnosis Date  . COPD (chronic obstructive pulmonary disease)   . Gastric ulcer   . DVT (deep venous thrombosis)   . PE (pulmonary embolism)   . Hypertension   . Hypercholesteremia   . Diabetes mellitus without complication   . GERD (gastroesophageal reflux disease)   . Atrial fibrillation   . Asthma     Medications:  Scheduled:  . apixaban  10 mg Oral BID   Followed by  . [START ON 09/02/2015] apixaban  5 mg Oral BID  . budesonide-formoterol  2 puff Inhalation BID  . insulin aspart  0-9 Units Subcutaneous TID WC  . ipratropium-albuterol  3 mL Inhalation QID  . lisinopril  40 mg Oral Daily  . metoprolol tartrate  12.5 mg Oral BID  . nicotine  21 mg Transdermal Daily  . pantoprazole  40 mg Oral BID  . potassium chloride SA  20 mEq Oral Daily  . pravastatin  10 mg Oral q1800  . sodium chloride  3 mL Intravenous Q12H  . tiotropium  18 mcg Inhalation Daily    Assessment: Pt is a 74 year old male with a hx of  PE/DVT presenting with a PE. Patient has been on a heparin drip. Pharmacy was consulted to dose apixaban   Goal of Therapy:  tx and prevention of PE Monitor platelets by anticoagulation protocol: Yes   Plan:  Apixaban  bid for 7 days followed by  bid . Nurse called and instructed to shut off heparin drip when she gives the first dose of apixaban. Pharmacy will continue to monitor CBC and Scr  Melissa D Maccia, Pharm.D Clinical Pharmacist   08/26/2015,9:52 AM

## 2015-08-26 NOTE — Progress Notes (Signed)
[facility]   PATIENT NAME: Austin [name]    MR#:  [id number]  DATE OF BIRTH:  01/28/1941  SUBJECTIVE:  CHIEF COMPLAINT:   Patient's breathing is stable denies any chest pain  REVIEW OF SYSTEMS:  CONSTITUTIONAL: No fever, fatigue or weakness.  EYES: No blurred or double vision.  EARS, NOSE, AND THROAT: No tinnitus or ear pain.  RESPIRATORY: No cough, shortness of breath, wheezing or hemoptysis.  CARDIOVASCULAR: No chest pain, orthopnea, edema.  GASTROINTESTINAL: No nausea, vomiting, diarrhea or abdominal pain.  GENITOURINARY: No dysuria, hematuria.  ENDOCRINE: No polyuria, nocturia,  HEMATOLOGY: No anemia, easy bruising or bleeding SKIN: No rash or lesion. MUSCULOSKELETAL: No joint pain or arthritis.   NEUROLOGIC: No tingling, numbness, weakness.  PSYCHIATRY: No anxiety or depression.   DRUG ALLERGIES:   Allergies  Allergen Reactions  . Contrast Media [Iodinated Diagnostic Agents] Nausea And Vomiting    VITALS:  Blood pressure 131/87, pulse 67, temperature 97.8 F (36.6 C), temperature source Oral, resp. rate 22, height 6' (1.829 m), weight 75.342 kg (166 lb 1.6 oz), SpO2 97 %.  PHYSICAL EXAMINATION:  GENERAL:  74 y.o.-year-old patient lying in the bed with no acute distress.  EYES: Pupils equal, round, reactive to light and accommodation. No scleral icterus. Extraocular muscles intact.  HEENT: Head atraumatic, normocephalic. Oropharynx and nasopharynx clear.  NECK:  Supple, no jugular venous distention. No thyroid enlargement, no tenderness.  LUNGS: Normal breath sounds bilaterally, no wheezing, rales,rhonchi or crepitation. No use of accessory muscles of respiration.  CARDIOVASCULAR: S1, S2 normal. No murmurs, rubs, or gallops.  ABDOMEN: Soft, nontender, nondistended. Bowel sounds present. No organomegaly or mass.  EXTREMITIES: No pedal edema, cyanosis, or clubbing.  NEUROLOGIC: Cranial nerves II through XII are intact.  Muscle strength 5/5 in all extremities. Sensation intact. Gait not checked.  PSYCHIATRIC: The patient is alert and oriented x 3.  SKIN: No obvious rash, lesion, or ulcer.    LABORATORY PANEL:   CBC  Recent Labs Lab 08/26/15 0504  WBC 4.9  HGB 13.8  HCT 43.1  PLT 154   ------------------------------------------------------------------------------------------------------------------  Chemistries   Recent Labs Lab 08/25/15 0407  NA 139  K 4.6  CL 108  CO2 26  GLUCOSE 79  BUN 19  CREATININE 1.34*  CALCIUM 10.5*   ------------------------------------------------------------------------------------------------------------------  Cardiac Enzymes No results for input(s): TROPONINI in the last 168 hours. ------------------------------------------------------------------------------------------------------------------  RADIOLOGY:  Ct Angio Chest Pe W/cm &/or Wo Cm  08/24/2015   CLINICAL DATA:  Chest pain, shortness of breath, nausea  EXAM: CT ANGIOGRAPHY CHEST WITH CONTRAST  TECHNIQUE: Multidetector CT imaging of the chest was performed using the standard protocol during bolus administration of intravenous contrast. Multiplanar CT image reconstructions and MIPs were obtained to evaluate the vascular anatomy.  CONTRAST:  75mL OMNIPAQUE IOHEXOL 350 MG/ML SOLN  COMPARISON:  Chest x-ray of 08/23/2015 and CT chest of 11/24/2013  FINDINGS: The pulmonary arteries are well opacified. There are acute pulmonary emboli present primarily within the left pulmonary artery branch to the lingula as well as in the right main pulmonary artery. There is some webbing within branches to both lower lobes which may indicate chronic changes of pulmonary emboli as well with fibrosis. Therefore I believe this represents acute pulmonary emboli superimposed on chronic changes from prior pulmonary emboli. There is marked irregularity of the lumen of the right main pulmonary artery due to both chronic and acute  disease. The RV/LV ratio is markedly elevated measuring 2.6. Positive  for acute PE with CT evidence of right heart strain (RV/LV Ratio = 2.6) consistent with at least submassive (intermediate risk) PE. The presence of right heart strain has been associated with an increased risk of morbidity and mortality. The thoracic aorta is no no mediastinal adenopathy is noted. Coronary artery calcifications are present.  On lung window images, there are diffuse changes of centrilobular and paraseptal emphysema. A spiculated nodular lesion in the posterior inferior right upper lobe measuring 16 mm compared to 13 mm previously. This could represent a slow growing malignancy. In addition, there is a small similarly spiculated lesion within the right middle lobe, measuring 11 mm compared to 7 mm on the prior CT, worrisome for either primary or metastatic disease. The central airway is patent. The thoracic vertebrae are in normal alignment with degenerative changes present.  Review of the MIP images confirms the above findings.  IMPRESSION: 1. Acute pulmonary emboli are noted superimposed on chronic pulmonary emboli as described above. 2. Abnormal RV/LV ratio. Positive for acute PE with CTevidence of right heart strain (RV/LV Ratio = 2.6) consistent with at least submassive (intermediate risk) PE. The presence of right heart strain has been associated with an increased risk of morbidity and mortality. 3. Two spiculated lesions, 1 in the right upper lobe inferiorly and 1 in the right middle lobe worrisome for primary or metastatic disease. 4. Moderately severe centrilobular and paraseptal emphysema. Critical Value/emergent results were called by telephone at the time of interpretation on 08/24/2015 at 2:19 pm to Dr. Ned Clines , who verbally acknowledged these results.   Electronically Signed   By: Dwyane Dee M.D.   On: 08/24/2015 14:25   US Venous Img Lower Bilateral  08/25/2015   CLINICAL DATA:  74 year old male with a  history of swelling for a week.  EXAM: BILATERAL LOWER EXTREMITY VENOUS DOPPLER ULTRASOUND  TECHNIQUE: Gray-scale sonography with graded compression, as well as color Doppler and duplex ultrasound were performed to evaluate the lower extremity deep venous systems from the level of the common femoral vein and including the common femoral, femoral, profunda femoral, popliteal and calf veins including the posterior tibial, peroneal and gastrocnemius veins when visible. The superficial great saphenous vein was also interrogated. Spectral Doppler was utilized to evaluate flow at rest and with distal augmentation maneuvers in the common femoral, femoral and popliteal veins.  COMPARISON:  Duplex 04/11/2015  FINDINGS: RIGHT LOWER EXTREMITY  Common Femoral Vein: No evidence of thrombus. Normal compressibility, respiratory phasicity and response to augmentation.  Saphenofemoral Junction: No evidence of thrombus. Normal compressibility and flow on color Doppler imaging.  Profunda Femoral Vein: No evidence of thrombus. Normal compressibility and flow on color Doppler imaging.  Femoral Vein: No evidence of thrombus. Normal compressibility, respiratory phasicity and response to augmentation.  Popliteal Vein: Nonocclusive thrombus of the right popliteal vein which was present on the comparison of May 2016  Calf Veins: No evidence of thrombus. Normal compressibility and flow on color Doppler imaging.  Superficial Great Saphenous Vein: No evidence of thrombus. Normal compressibility and flow on color Doppler imaging.  Other Findings:  None.  LEFT LOWER EXTREMITY  Common Femoral Vein: No evidence of thrombus. Normal compressibility, respiratory phasicity and response to augmentation.  Saphenofemoral Junction: No evidence of thrombus. Normal compressibility and flow on color Doppler imaging.  Profunda Femoral Vein: No evidence of thrombus. Normal compressibility and flow on color Doppler imaging.  Femoral Vein: No evidence of  thrombus. Normal compressibility, respiratory phasicity and response to augmentation.  Popliteal  Vein: No evidence of thrombus. Normal compressibility, respiratory phasicity and response to augmentation.  Calf Veins: No evidence of thrombus. Normal compressibility and flow on color Doppler imaging.  Superficial Great Saphenous Vein: No evidence of thrombus. Normal compressibility and flow on color Doppler imaging.  Other Findings:  None.  IMPRESSION: Sonographic survey negative for acute DVT of the bilateral lower extremity.  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  Signed,  Yvone Neu. Loreta Ave, DO  [facility]  [facility]   Electronically Signed   By: Gilmer Mor D.O.   On: 08/25/2015 09:48    EKG:   Orders placed or performed during the hospital encounter of 08/15/15  . ED EKG  . ED EKG  . EKG 12-Lead  . EKG 12-Lead  . 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 Acute on chronic pulmonary embolism  Discontinue heparin, start on Eliquis  Patient is currently on PPI .  Chronic right lower extremity DVT  2. Diabetes hypoglycemia oral treatment on hold, continue metformin  3. COPD without any evidence of acute exasperation continue inhalers as taking at home  4. HTN continue lisinopril  5. Miscellaneous he'll be on heparin and PPI for GI prophylaxis      CODE STATUS: full  TOTAL TIME TAKING CARE OF THIS PATIENT: 35 minutes.   Discharge tomorrow   Auburn Bilberry M.D on 08/26/2015 at 1:14 PM  Between 7am to 6pm - Pager - [phone] After 6pm go to [email or web address] - password [email or web address] [facility]  [place]  Office  [phone]  CC: Primary care physician; [facility]

## 2015-08-26 NOTE — Care Management (Signed)
Patient has had a recent observation stay for elevated troponin.  Presents as a direct admission from pulmonologist office for acute on chronic pulmonary embolus.  Patient has been on coumadin the the past for DVT/PE but med was stopped due to gi bleeding.  Patient had home 02 set up in the home through [facility] 06/2015.  PT consult is pending.  Informed that patient will be sent hone on Eliquis which will be new.  Patient defers any discharge planning for his wife.  She is currently at work.  CM conatct information has been left in the room for patient's wife

## 2015-08-26 NOTE — Progress Notes (Signed)
PT Cancellation Note  Patient Details Name: Austin [name] MRN: [id number] DOB: 11-27-1941   Cancelled Treatment:    Reason Eval/Treat Not Completed: Patient not medically ready. Pt admitted with positive pulmonary embolism. Pt evaluation on hold due per policy of pt needing to be on blood thinners for at least 48 hours prior to evaluation. Heparin drip started at 19:37 on 9/13.    Isaiah Blakes, SPT 08/26/2015, 1:26 PM

## 2015-08-26 NOTE — Progress Notes (Signed)
FSBS 61, orange juice given, will monitor

## 2015-08-26 NOTE — Progress Notes (Signed)
1 amp D50 given, will recheck pt's FSBS

## 2015-08-26 NOTE — Plan of Care (Signed)
Problem: Phase I Progression Outcomes Goal: OOB as tolerated unless otherwise ordered Outcome: Progressing PT evaluation ordered

## 2015-08-27 LAB — CBC
HCT: 41.9 % (ref 40.0–52.0)
Hemoglobin: 13.7 g/dL (ref 13.0–18.0)
MCH: 31.2 pg (ref 26.0–34.0)
MCHC: 32.8 g/dL (ref 32.0–36.0)
MCV: 95.1 fL (ref 80.0–100.0)
PLATELETS: 153 10*3/uL (ref 150–440)
RBC: 4.41 MIL/uL (ref 4.40–5.90)
RDW: 16.5 % — AB (ref 11.5–14.5)
WBC: 5.3 10*3/uL (ref 3.8–10.6)

## 2015-08-27 LAB — GLUCOSE, CAPILLARY
GLUCOSE-CAPILLARY: 111 mg/dL — AB (ref 65–99)
Glucose-Capillary: 59 mg/dL — ABNORMAL LOW (ref 65–99)
Glucose-Capillary: 87 mg/dL (ref 65–99)

## 2015-08-27 LAB — CREATININE, SERUM
CREATININE: 1.21 mg/dL (ref 0.61–1.24)
GFR calc Af Amer: >60 mL/min (ref >60)
GFR, EST NON AFRICAN AMERICAN: 58 mL/min — AB (ref >60)

## 2015-08-27 MED ORDER — APIXABAN 5 MG PO TABS: 5.0000 mg | ORAL_TABLET | Freq: Two times a day (BID) | ORAL | Status: DC

## 2015-08-27 MED ORDER — APIXABAN 5 MG PO TABS: 10.0000 mg | ORAL_TABLET | Freq: Two times a day (BID) | ORAL | Status: DC

## 2015-08-27 NOTE — Plan of Care (Addendum)
Problem: Discharge Progression Outcomes Goal: Other Discharge Outcomes/Goals Outcome: Completed/Met Date Met:  08/27/15 Pt is alert and oriented x 4, denies pain, up in room with stand by assist, fair appetite, pt is d./c to home on eliquis, bm throughout shift. Pt and family educated on eliquis, hard copy of Rx provided to patient along with coupon. Pt has appt schedulded with pcp and heart failure clininic.

## 2015-08-27 NOTE — Care Management (Signed)
Provided patient with 30 day coupon for Eliquis for pulmonary embolus.  Agreeable to have home health nursing trough [facility] who provided services in the past.  No physical therapy is indicated.  Have left phone message for wife to speak with CM prior to transporting patient home

## 2015-08-27 NOTE — Evaluation (Signed)
Physical Therapy Evaluation Patient Details Name: Austin [name] MRN: [id number] DOB: 07/24/1941 Today's Date: 08/27/2015   History of Present Illness  presented to ER and admitted from pulmonologist office with acute/chronic PE; managed with heparin drip (started 9/13 at 1937), now transitioned to eliquis.  Clinical Impression  Upon evaluation, patient alert and oriented, follows all commands and demonstrates good safety awareness/insight.  Bilat UE/LE strength and ROM grossly WFL and symmetrical; no focal weakness noted.  Able to complete bed mobility indep; sit/stand, basic transfers and gait (400') without assist device, distant sup/mod indep.  Fair/good cadence (10' walk time, 5 seconds) without LOB or safety concern.  Demonstrates the ability to self-regulate and pace activity levels, initiating standing rest periods with gait as needed; maintains sats >96% on RA at rest and with exertion. Patient without acute PT needs identified at this time.  Currently mod indep for all mobility and appears at baseline level of functional ability. Patient in agreement and without concerns regarding mobility and upcoming discharge.  Will discontinue initial PT order at this time; please re-consult should needs change.    Follow Up Recommendations      Equipment Recommendations       Recommendations for Other Services       Precautions / Restrictions Precautions Precautions: Fall Restrictions Weight Bearing Restrictions: No      Mobility  Bed Mobility Overal bed mobility: Independent                Transfers Overall transfer level: Modified independent               General transfer comment: without assist, minimal use of UEs  Ambulation/Gait Ambulation/Gait assistance: Modified independent (Device/Increase time) Ambulation Distance (Feet): 400 Feet Assistive device: None       General Gait Details: reciprocal stepping pattern with fair/good step height/length,  fair/good cadence and gait speed.  Mildly excessive L lateral weight shift with mild forward trunk flexion, but no overt LOB or safety concerns noted.  Maintains sats >96% on RA with exertion; BORG 6-8/10 per patient report.  single standing rest period throughout gait distance (patient self-initiated and regulated)  Stairs            Wheelchair Mobility    Modified Rankin (Stroke Patients Only)       Balance Overall balance assessment: Modified Independent                                           Pertinent Vitals/Pain Pain Assessment: No/denies pain    Home Living Family/patient expects to be discharged to:: Private residence Living Arrangements: Spouse/significant other Available Help at Discharge: Family Type of Home: House Home Access: Stairs to enter   Secretary/administrator of Steps: 1 Home Layout: One level        Prior Function Level of Independence: Independent         Comments: Indep with household and community activities; denies fall history.  enjoys gardening.     Hand Dominance        Extremity/Trunk Assessment   Upper Extremity Assessment: Overall WFL for tasks assessed           Lower Extremity Assessment: Overall WFL for tasks assessed         Communication   Communication: No difficulties  Cognition Arousal/Alertness: Awake/alert Behavior During Therapy: WFL for tasks assessed/performed Overall Cognitive Status: Within Functional Limits  for tasks assessed                      General Comments      Exercises        Assessment/Plan    PT Assessment Patent does not need any further PT services  PT Diagnosis     PT Problem List    PT Treatment Interventions     PT Goals (Current goals can be found in the Care Plan section) Acute Rehab PT Goals Patient Stated Goal: "to get back to my garden" PT Goal Formulation: With patient Time For Goal Achievement: 08/28/15 Potential to Achieve Goals:  Good    Frequency     Barriers to discharge        Co-evaluation               End of Session Equipment Utilized During Treatment: Gait belt Activity Tolerance: Patient tolerated treatment well Patient left: in chair;with call bell/phone within reach (alarm pad under patient; box not available.  CNA informed/aware)           Time: 0981-1914 PT Time Calculation (min) (ACUTE ONLY): 19 min   Charges:   PT Evaluation $Initial PT Evaluation Tier I: 1 Procedure     PT G Codes:       Kristen H. Manson Passey, PT, DPT, NCS 08/27/2015, 11:27 AM [phone]

## 2015-08-27 NOTE — Discharge Summary (Signed)
Austin [name] [name], 74 y.o., DOB 06-23-1941, MRN [id number]. Admission date: 08/24/2015 Discharge Date 08/27/2015 [facility] Admitting Physician Auburn Bilberry, MD  Admission Diagnosis  pulmonary embolism bilateral   Discharge Diagnosis       SOB (shortness of breath)   Pulmonary embolism Chronic right lower extremity DVT COPD Gastric ulcer Hypertension Diabetes Atrial fibrillation Asthma Chronic respiratory failure        Hospital Course Austin [name] is a 74 y.o. male with a known history of COPD pulmonary embolism and DVT in the past was on Coumadin but this was discontinued due to a gastric ulcer. Patient was recently evaluated outpatient and was noted to have pulmonary hypertension and was referred to Dr. Reita Cliche. He initially went underwent a VQ scan which was abnormal. Patient subsequently had a CT per PE today which showed a acute and chronic pulmonary embolism and therefore he was referred for an admission. Patient was admitted and started on a heparin drip. He was kept on the heparin drip his breathing improved. Subsequently he was changed over to Eliquis. Patient does have chronic right lower extremity DVT.             Consults  None  Significant Tests:  See full reports for all details    Dg Chest 2 View  08/23/2015   CLINICAL DATA:  Pulmonary hypertension.  EXAM: CHEST  2 VIEW  COMPARISON:  07/05/2015.  FINDINGS: Mediastinum and hilar structures are normal. Cardiomegaly with normal pulmonary vascularity. Previously identified right base atelectasis/infiltrate and/or right pleural effusion has cleared. No acute bony abnormality.  IMPRESSION: 1. Interim clearing of previously identified right base atelectasis/infiltrates and/or right pleural effusion. No acute pulmonary abnormality identified. 2. Stable cardiomegaly.   Electronically Signed   By: Maisie Fus  Register   On: 08/23/2015 08:31   Dg Abd 1 View  08/15/2015    CLINICAL DATA:  74 year old male with without bowel movement in 4 days. Initial encounter.  EXAM: ABDOMEN - 1 VIEW  COMPARISON:  CT Abdomen and Pelvis 04/11/2015.  FINDINGS: 2 supine views of the abdomen and pelvis. Lung bases appear negative. Cardiomegaly again noted. Stable cholecystectomy clips. Non obstructed bowel gas pattern. Retained stool throughout the colon. Evidence of left colon diverticulosis. No acute osseous abnormality identified. No definite pneumoperitoneum on these supine views.  IMPRESSION: Non obstructed bowel gas pattern with retained stool throughout the colon.   Electronically Signed   By: Odessa Fleming M.D.   On: 08/15/2015 18:07   Ct Angio Chest Pe W/cm &/or Wo Cm  08/24/2015   CLINICAL DATA:  Chest pain, shortness of breath, nausea  EXAM: CT ANGIOGRAPHY CHEST WITH CONTRAST  TECHNIQUE: Multidetector CT imaging of the chest was performed using the standard protocol during bolus administration of intravenous contrast. Multiplanar CT image reconstructions and MIPs were obtained to evaluate the vascular anatomy.  CONTRAST:  75mL OMNIPAQUE IOHEXOL 350 MG/ML SOLN  COMPARISON:  Chest x-ray of 08/23/2015 and CT chest of 11/24/2013  FINDINGS: The pulmonary arteries are well opacified. There are acute pulmonary emboli present primarily within the left pulmonary artery branch to the lingula as well as in the right main pulmonary artery. There is some webbing within branches to both lower lobes which may indicate chronic changes of pulmonary emboli as well with fibrosis. Therefore I believe this represents acute pulmonary emboli superimposed on chronic changes from prior pulmonary emboli. There is marked irregularity of the lumen of the right main pulmonary artery due to both chronic  and acute disease. The RV/LV ratio is markedly elevated measuring 2.6. Positive for acute PE with CT evidence of right heart strain (RV/LV Ratio = 2.6) consistent with at least submassive (intermediate risk) PE. The presence  of right heart strain has been associated with an increased risk of morbidity and mortality. The thoracic aorta is no no mediastinal adenopathy is noted. Coronary artery calcifications are present.  On lung window images, there are diffuse changes of centrilobular and paraseptal emphysema. A spiculated nodular lesion in the posterior inferior right upper lobe measuring 16 mm compared to 13 mm previously. This could represent a slow growing malignancy. In addition, there is a small similarly spiculated lesion within the right middle lobe, measuring 11 mm compared to 7 mm on the prior CT, worrisome for either primary or metastatic disease. The central airway is patent. The thoracic vertebrae are in normal alignment with degenerative changes present.  Review of the MIP images confirms the above findings.  IMPRESSION: 1. Acute pulmonary emboli are noted superimposed on chronic pulmonary emboli as described above. 2. Abnormal RV/LV ratio. Positive for acute PE with CTevidence of right heart strain (RV/LV Ratio = 2.6) consistent with at least submassive (intermediate risk) PE. The presence of right heart strain has been associated with an increased risk of morbidity and mortality. 3. Two spiculated lesions, 1 in the right upper lobe inferiorly and 1 in the right middle lobe worrisome for primary or metastatic disease. 4. Moderately severe centrilobular and paraseptal emphysema. Critical Value/emergent results were called by telephone at the time of interpretation on 08/24/2015 at 2:19 pm to Dr. Ned Clines , who verbally acknowledged these results.   Electronically Signed   By: Dwyane Dee M.D.   On: 08/24/2015 14:25   Nm Myocar Multi W/spect W/wall Motion / Ef  08/17/2015    There was no ST segment deviation noted during stress.  The study is normal.  This is a low risk study.  The left ventricular ejection fraction is normal (55-65%).    Nm Pulmonary Perf And Vent  08/23/2015   CLINICAL DATA:  Pulmonary  hypertension.  EXAM: NUCLEAR MEDICINE VENTILATION - PERFUSION LUNG SCAN  TECHNIQUE: Ventilation images were obtained in multiple projections using inhaled aerosol Tc-24m DTPA. Perfusion images were obtained in multiple projections after intravenous injection of Tc-50m MAA.  RADIOPHARMACEUTICALS:  44.5 Technetium-83m DTPA aerosol inhalation and 4.4 Technetium-74m MAA IV  COMPARISON:  11/24/2013  FINDINGS: Remarkably heterogeneous lung perfusion with numerous small and at least 2 large segmental perfusion defects (posterior segment right upper lobe and lingula). The larger defects are matched. This is an intermediate probability pattern by PIOPED II. Appearance is stable from 2014. Ventilation is more heterogeneous than perfusion, with central deposition, correlating with advanced emphysema on 11/24/2013 chest CT.  IMPRESSION: Abnormal lung ventilation and perfusion, stable from 2014, at least partially from patient's emphysema. Superimposed chronic pulmonary embolism may be present.   Electronically Signed   By: Marnee Spring M.D.   On: 08/23/2015 09:49   US Venous Img Lower Bilateral  08/25/2015   CLINICAL DATA:  74 year old male with a history of swelling for a week.  EXAM: BILATERAL LOWER EXTREMITY VENOUS DOPPLER ULTRASOUND  TECHNIQUE: Gray-scale sonography with graded compression, as well as color Doppler and duplex ultrasound were performed to evaluate the lower extremity deep venous systems from the level of the common femoral vein and including the common femoral, femoral, profunda femoral, popliteal and calf veins including the posterior tibial, peroneal and gastrocnemius veins when visible.  The superficial great saphenous vein was also interrogated. Spectral Doppler was utilized to evaluate flow at rest and with distal augmentation maneuvers in the common femoral, femoral and popliteal veins.  COMPARISON:  Duplex 04/11/2015  FINDINGS: RIGHT LOWER EXTREMITY  Common Femoral Vein: No evidence of thrombus.  Normal compressibility, respiratory phasicity and response to augmentation.  Saphenofemoral Junction: No evidence of thrombus. Normal compressibility and flow on color Doppler imaging.  Profunda Femoral Vein: No evidence of thrombus. Normal compressibility and flow on color Doppler imaging.  Femoral Vein: No evidence of thrombus. Normal compressibility, respiratory phasicity and response to augmentation.  Popliteal Vein: Nonocclusive thrombus of the right popliteal vein which was present on the comparison of May 2016  Calf Veins: No evidence of thrombus. Normal compressibility and flow on color Doppler imaging.  Superficial Great Saphenous Vein: No evidence of thrombus. Normal compressibility and flow on color Doppler imaging.  Other Findings:  None.  LEFT LOWER EXTREMITY  Common Femoral Vein: No evidence of thrombus. Normal compressibility, respiratory phasicity and response to augmentation.  Saphenofemoral Junction: No evidence of thrombus. Normal compressibility and flow on color Doppler imaging.  Profunda Femoral Vein: No evidence of thrombus. Normal compressibility and flow on color Doppler imaging.  Femoral Vein: No evidence of thrombus. Normal compressibility, respiratory phasicity and response to augmentation.  Popliteal Vein: No evidence of thrombus. Normal compressibility, respiratory phasicity and response to augmentation.  Calf Veins: No evidence of thrombus. Normal compressibility and flow on color Doppler imaging.  Superficial Great Saphenous Vein: No evidence of thrombus. Normal compressibility and flow on color Doppler imaging.  Other Findings:  None.  IMPRESSION: Sonographic survey negative for acute DVT of the bilateral lower extremity.  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  Signed,  Yvone Neu. Loreta Ave, DO  [facility]  [facility]   Electronically Signed   By: Gilmer Mor D.O.   On: 08/25/2015 09:48        Today   Subjective:   [name] feels okay denies any chest pain or shortness of breath  Objective:   Blood pressure 137/94, pulse 95, temperature 97.4 F (36.3 C), temperature source Oral, resp. rate 22, height 6' (1.829 m), weight 75.342 kg (166 lb 1.6 oz), SpO2 96 %.  .  Intake/Output Summary (Last 24 hours) at 08/27/15 1326 Last data filed at 08/27/15 0900  Gross per 24 hour  Intake 983.33 ml  Output   1175 ml  Net -191.67 ml    Exam VITAL SIGNS: Blood pressure 137/94, pulse 95, temperature 97.4 F (36.3 C), temperature source Oral, resp. rate 22, height 6' (1.829 m), weight 75.342 kg (166 lb 1.6 oz), SpO2 96 %.  GENERAL:  74 y.o.-year-old patient lying in the bed with no acute distress.  EYES: Pupils equal, round, reactive to light and accommodation. No scleral icterus. Extraocular muscles intact.  HEENT: Head atraumatic, normocephalic. Oropharynx and nasopharynx clear.  NECK:  Supple, no jugular venous distention. No thyroid enlargement, no tenderness.  LUNGS: Normal breath sounds bilaterally, no wheezing, rales,rhonchi or crepitation. No use of accessory muscles of respiration.  CARDIOVASCULAR: S1, S2 normal. No murmurs, rubs, or gallops.  ABDOMEN: Soft, nontender, nondistended. Bowel sounds present. No organomegaly or mass.  EXTREMITIES: No pedal edema, cyanosis, or clubbing.  NEUROLOGIC: Cranial nerves II through XII are intact. Muscle strength 5/5 in all extremities. Sensation intact. Gait not checked.  PSYCHIATRIC: The patient is alert and oriented x 3.  SKIN: No obvious rash,  lesion, or ulcer.   Data Review     CBC w Diff: Lab Results  Component Value Date   WBC 5.3 08/27/2015   WBC 5.5 04/05/2015   HGB 13.7 08/27/2015   HGB 14.1 04/05/2015   HCT 41.9 08/27/2015   HCT 43.4 04/05/2015   PLT 153 08/27/2015   PLT 153 04/05/2015   LYMPHOPCT 22 08/24/2015   LYMPHOPCT 13.8 04/05/2015   MONOPCT 11 08/24/2015   MONOPCT 9.1 04/05/2015   EOSPCT 3  08/24/2015   EOSPCT 1.2 04/05/2015   BASOPCT 1 08/24/2015   BASOPCT 1.1 04/05/2015   CMP: Lab Results  Component Value Date   NA 139 08/25/2015   NA 141 04/05/2015   K 4.6 08/25/2015   K 3.9 04/05/2015   CL 108 08/25/2015   CL 104 04/05/2015   CO2 26 08/25/2015   CO2 30 04/05/2015   BUN 19 08/25/2015   BUN 12 04/05/2015   CREATININE 1.21 08/27/2015   CREATININE 1.11 04/05/2015   PROT 6.9 08/15/2015   PROT 6.5 03/06/2015   ALBUMIN 4.0 08/15/2015   ALBUMIN 3.8 03/06/2015   BILITOT 1.2 08/15/2015   BILITOT 1.1 03/06/2015   ALKPHOS 84 08/15/2015   ALKPHOS 67 03/06/2015   AST 21 08/15/2015   AST 20 03/06/2015   ALT 10* 08/15/2015   ALT 9* 03/06/2015  .  Micro Results No results found for this or any previous visit (from the past 240 hour(s)).      Code Status Orders        Start     Ordered   08/24/15 1742  Full code   Continuous     08/24/15 1742    Advance Directive Documentation        Most Recent Value   Type of Advance Directive  Healthcare Power of Attorney, Living will   Pre-existing out of facility DNR order (yellow form or pink MOST form)     "MOST" Form in Place?            Follow-up Information    Follow up with Delma Freeze, FNP. Go on 08/31/2015.   Specialty:  Family Medicine   Why:  at 1:00pm , to [facility]   Contact information:   [address] [place] [address] [phone]       Follow up with [facility]. Go on 09/02/2015.   Why:  at 2:30pm.    Contact information:   [address] [place] [address] [phone]       Discharge Medications     Medication List    TAKE these medications        albuterol (2.5 MG/3ML) 0.083% nebulizer solution  Commonly known as:  PROVENTIL  Take 3 mLs (2.5 mg total) by nebulization every 6 (six) hours as needed for wheezing or shortness of breath.     albuterol-ipratropium 18-103 MCG/ACT inhaler  Commonly known as:   COMBIVENT  Inhale 2 puffs into the lungs 4 (four) times daily.     apixaban 5 MG Tabs tablet  Commonly known as:  ELIQUIS  Take 2 tablets (10 mg total) by mouth 2 (two) times daily.     apixaban 5 MG Tabs tablet  Commonly known as:  ELIQUIS  Take 1 tablet (5 mg total) by mouth 2 (two) times daily.  Start taking on:  09/02/2015     budesonide-formoterol 160-4.5 MCG/ACT inhaler  Commonly known as:  SYMBICORT  Inhale 2  puffs into the lungs 2 (two) times daily.     cefUROXime 500 MG tablet  Commonly known as:  CEFTIN  Take 1 tablet (500 mg total) by mouth 2 (two) times daily with a meal.     furosemide 40 MG tablet  Commonly known as:  LASIX  Take 1 tablet (40 mg total) by mouth 2 (two) times daily.     glipiZIDE 5 MG tablet  Commonly known as:  GLUCOTROL  Take 5 mg by mouth 2 (two) times daily before a meal.     lisinopril 40 MG tablet  Commonly known as:  PRINIVIL,ZESTRIL  Take 40 mg by mouth daily.     lovastatin 40 MG tablet  Commonly known as:  MEVACOR  Take 40 mg by mouth daily with supper. Pt should take with food.     metFORMIN 500 MG (MOD) 24 hr tablet  Commonly known as:  GLUMETZA  Take 500 mg by mouth 2 (two) times daily with a meal.     metoprolol tartrate 25 MG tablet  Commonly known as:  LOPRESSOR  Take 12.5 mg by mouth 2 (two) times daily.     nicotine 21 mg/24hr patch  Commonly known as:  NICODERM CQ - dosed in mg/24 hours  Place 1 patch (21 mg total) onto the skin daily.     nitroGLYCERIN 0.4 MG SL tablet  Commonly known as:  NITROSTAT  Place 0.4 mg under the tongue every 5 (five) minutes x 3 doses as needed for chest pain. If no relief call 911 or go to emergency room.     omeprazole 20 MG capsule  Commonly known as:  PRILOSEC  Take 20 mg by mouth 2 (two) times daily.     polyethylene glycol packet  Commonly known as:  MIRALAX  Take 17 g by mouth daily as needed for moderate constipation.     potassium chloride SA 20 MEQ tablet  Commonly known  as:  K-DUR,KLOR-CON  Take 1 tablet (20 mEq total) by mouth daily.     predniSONE 5 MG tablet  Commonly known as:  DELTASONE  Take 1 tablet (5 mg total) by mouth daily with breakfast. 4 tabs day1; 3 tabs day2; 2 tabs day3; 1 tab day4,5 then stop     tiotropium 18 MCG inhalation capsule  Commonly known as:  SPIRIVA  Place 1 capsule (18 mcg total) into inhaler and inhale daily.           Total Time in preparing paper work, data evaluation and todays exam - 35 minutes  [place] Bilberry M.D on 08/27/2015 at 1:26 PM  [facility]   Office  [phone]

## 2015-08-27 NOTE — Care Management (Signed)
Patient's wife contacted CM to relay that she is now declining home health nursing.  Confirms that she has the Eliquis coupon.  Paged attending to inform that wife has declined services.  This is of concern due to potential for readm.  Explained to wife and she continues to decline service.

## 2015-08-31 ENCOUNTER — Encounter: Payer: Self-pay | Admitting: Family

## 2015-08-31 ENCOUNTER — Ambulatory Visit: Payer: BLUE CROSS/BLUE SHIELD | Attending: Family | Admitting: Family

## 2015-08-31 VITALS — BP 126/75 | HR 68 | Resp 20 | Ht 72.0 in | Wt 174.0 lb

## 2015-08-31 DIAGNOSIS — I10 Essential (primary) hypertension: Secondary | ICD-10-CM | POA: Diagnosis not present

## 2015-08-31 DIAGNOSIS — I4891 Unspecified atrial fibrillation: Secondary | ICD-10-CM | POA: Diagnosis not present

## 2015-08-31 DIAGNOSIS — E780 Pure hypercholesterolemia: Secondary | ICD-10-CM | POA: Insufficient documentation

## 2015-08-31 DIAGNOSIS — E119 Type 2 diabetes mellitus without complications: Secondary | ICD-10-CM | POA: Diagnosis not present

## 2015-08-31 DIAGNOSIS — I509 Heart failure, unspecified: Secondary | ICD-10-CM | POA: Insufficient documentation

## 2015-08-31 DIAGNOSIS — K259 Gastric ulcer, unspecified as acute or chronic, without hemorrhage or perforation: Secondary | ICD-10-CM | POA: Insufficient documentation

## 2015-08-31 DIAGNOSIS — J449 Chronic obstructive pulmonary disease, unspecified: Secondary | ICD-10-CM | POA: Insufficient documentation

## 2015-08-31 DIAGNOSIS — I2699 Other pulmonary embolism without acute cor pulmonale: Secondary | ICD-10-CM | POA: Diagnosis not present

## 2015-08-31 DIAGNOSIS — Z87891 Personal history of nicotine dependence: Secondary | ICD-10-CM | POA: Diagnosis not present

## 2015-08-31 DIAGNOSIS — Z79899 Other long term (current) drug therapy: Secondary | ICD-10-CM | POA: Insufficient documentation

## 2015-08-31 DIAGNOSIS — J45909 Unspecified asthma, uncomplicated: Secondary | ICD-10-CM | POA: Diagnosis not present

## 2015-08-31 DIAGNOSIS — Z86718 Personal history of other venous thrombosis and embolism: Secondary | ICD-10-CM | POA: Insufficient documentation

## 2015-08-31 DIAGNOSIS — K219 Gastro-esophageal reflux disease without esophagitis: Secondary | ICD-10-CM | POA: Insufficient documentation

## 2015-08-31 DIAGNOSIS — I5030 Unspecified diastolic (congestive) heart failure: Secondary | ICD-10-CM

## 2015-08-31 NOTE — Patient Instructions (Signed)
Continue weighing daily and call for an overnight weight gain of > 2 pounds or a weekly weight gain of >5 pounds. 

## 2015-08-31 NOTE — Progress Notes (Signed)
Subjective:    Patient ID: Austin [name], male    DOB: 10-28-1941, 74 y.o.   MRN: [id number]  Congestive Heart Failure Presents for follow-up visit. The disease course has been improving. Associated symptoms include abdominal pain, edema, fatigue, palpitations and shortness of breath. Pertinent negatives include no chest pain, chest pressure or orthopnea. The symptoms have been improving. Past treatments include salt and fluid restriction, oxygen, ACE inhibitors and beta blockers. The treatment provided moderate relief. Compliance with prior treatments has been good. His past medical history is significant for arrhythmia, chronic lung disease, DM and HTN. He has one 1st degree relative with heart disease. Compliance with total regimen is 76-100%.  Other This is a new (pulmonary embolus) problem. The current episode started 1 to 4 weeks ago. The problem occurs daily. The problem has been gradually improving. Associated symptoms include abdominal pain, congestion, coughing and fatigue. Pertinent negatives include no chest pain, fever, nausea, numbness, sore throat or vomiting. Nothing aggravates the symptoms. Treatments tried: blood thinners. The treatment provided moderate relief.    Past Medical History  Diagnosis Date  . COPD (chronic obstructive pulmonary disease)   . Gastric ulcer   . DVT (deep venous thrombosis)   . PE (pulmonary embolism)   . Hypertension   . Hypercholesteremia   . Diabetes mellitus without complication   . GERD (gastroesophageal reflux disease)   . Atrial fibrillation   . Asthma     Past Surgical History  Procedure Laterality Date  . Hernia repair    . Prostate surgery      Family History  Problem Relation Age of Onset  . CAD Brother   . Congestive Heart Failure Brother     Social History  Substance Use Topics  . Smoking status: Former Smoker -- 1.00 packs/day    Quit date: 07/04/2015  . Smokeless tobacco: Not on file  . Alcohol Use: No     Allergies  Allergen Reactions  . Contrast Media [Iodinated Diagnostic Agents] Nausea And Vomiting    Prior to Admission medications   Medication Sig Start Date End Date Taking? Authorizing Provider  albuterol (PROVENTIL) (2.5 MG/3ML) 0.083% nebulizer solution Take 3 mLs (2.5 mg total) by nebulization every 6 (six) hours as needed for wheezing or shortness of breath. 07/08/15  Yes Alford Highland, MD  albuterol-ipratropium (COMBIVENT) 18-103 MCG/ACT inhaler Inhale 2 puffs into the lungs 4 (four) times daily.   Yes Historical Provider, MD  apixaban (ELIQUIS) 5 MG TABS tablet Take 1 tablet (5 mg total) by mouth 2 (two) times daily. 09/02/15  Yes Auburn Bilberry, MD  budesonide-formoterol (SYMBICORT) 160-4.5 MCG/ACT inhaler Inhale 2 puffs into the lungs 2 (two) times daily.   Yes Historical Provider, MD  cefUROXime (CEFTIN) 500 MG tablet Take 1 tablet (500 mg total) by mouth 2 (two) times daily with a meal. 07/08/15  Yes Alford Highland, MD  furosemide (LASIX) 40 MG tablet Take 1 tablet (40 mg total) by mouth 2 (two) times daily. Patient taking differently: Take 20 mg by mouth 2 (two) times daily.  07/21/15  Yes Delma Freeze, FNP  lisinopril (PRINIVIL,ZESTRIL) 40 MG tablet Take 40 mg by mouth daily.   Yes Historical Provider, MD  lovastatin (MEVACOR) 40 MG tablet Take 40 mg by mouth daily with supper. Pt should take with food.   Yes Historical Provider, MD  metFORMIN (GLUMETZA) 500 MG (MOD) 24 hr tablet Take 500 mg by mouth 2 (two) times daily with a meal.   Yes Historical Provider,  MD  metoprolol tartrate (LOPRESSOR) 25 MG tablet Take 12.5 mg by mouth 2 (two) times daily.    Yes Historical Provider, MD  nicotine (NICODERM CQ - DOSED IN MG/24 HOURS) 21 mg/24hr patch Place 1 patch (21 mg total) onto the skin daily. 07/08/15  Yes Alford Highland, MD  nitroGLYCERIN (NITROSTAT) 0.4 MG SL tablet Place 0.4 mg under the tongue every 5 (five) minutes x 3 doses as needed for chest pain. If no relief call 911  or go to emergency room.   Yes Historical Provider, MD  omeprazole (PRILOSEC) 20 MG capsule Take 20 mg by mouth 2 (two) times daily.   Yes Historical Provider, MD  polyethylene glycol (MIRALAX) packet Take 17 g by mouth daily as needed for moderate constipation. 08/17/15  Yes Vipul Sherryll Burger, MD  potassium chloride SA (K-DUR,KLOR-CON) 20 MEQ tablet Take 1 tablet (20 mEq total) by mouth daily. 07/08/15  Yes Alford Highland, MD  predniSONE (DELTASONE) 5 MG tablet Take 1 tablet (5 mg total) by mouth daily with breakfast. 4 tabs day1; 3 tabs day2; 2 tabs day3; 1 tab day4,5 then stop 07/08/15  Yes Alford Highland, MD  tiotropium (SPIRIVA) 18 MCG inhalation capsule Place 1 capsule (18 mcg total) into inhaler and inhale daily. 07/08/15  Yes Alford Highland, MD     Review of Systems  Constitutional: Positive for fatigue. Negative for fever and appetite change.  HENT: Positive for congestion. Negative for postnasal drip and sore throat.   Eyes: Negative.   Respiratory: Positive for cough and shortness of breath. Negative for chest tightness and wheezing.   Cardiovascular: Positive for palpitations and leg swelling. Negative for chest pain.  Gastrointestinal: Positive for abdominal pain and constipation. Negative for nausea, vomiting and abdominal distention.  Endocrine: Negative.   Genitourinary: Negative.   Musculoskeletal: Positive for neck stiffness. Negative for back pain.  Skin: Negative.   Allergic/Immunologic: Negative.   Neurological: Positive for light-headedness. Negative for dizziness, facial asymmetry and numbness.  Hematological: Negative for adenopathy. Does not bruise/bleed easily.  Psychiatric/Behavioral: Positive for sleep disturbance (sleeping during the day on 2 pillows). Negative for dysphoric mood. The patient is not nervous/anxious.        Objective:   Physical Exam  Constitutional: He is oriented to person, place, and time. He appears well-developed and well-nourished.  HENT:   Head: Normocephalic and atraumatic.  Eyes: Conjunctivae are normal. Pupils are equal, round, and reactive to light.  Neck: Normal range of motion. Neck supple.  Cardiovascular: Normal rate.  An irregular rhythm present.  Pulmonary/Chest: Effort normal. He has no wheezes. He has no rales.  Abdominal: Soft. He exhibits no distension. There is no tenderness.  Musculoskeletal: He exhibits edema (1+ pitting edema in bilateral lower legs). He exhibits no tenderness.  Neurological: He is alert and oriented to person, place, and time.  Skin: Skin is warm and dry.  Psychiatric: He has a normal mood and affect. His behavior is normal. Thought content normal.  Nursing note and vitals reviewed.  BP 126/75 mmHg  Pulse 68  Resp 20  Ht 6' (1.829 m)  Wt 174 lb (78.926 kg)  BMI 23.59 kg/m2  SpO2 100%        Assessment & Plan:  1: Chronic heart failure with preserved ejection fraction- Patient presents after a recent hospitalization for a pulmonary embolus with some fatigue and shortness of breath upon exertion. He does wear oxygen at bedtime and during the day if needed. If so, it's set at 2L which does provide  some relief. He has been weighing himself daily and reports a stable weight. Reminded to call for an overnight weight gain of >2 pounds or a weekly weight gain of >5 pounds. Weight is down 16 pounds from his last visit here on 07/21/15. He is not adding any salt to his food and his wife does the cooking with Mrs. Dash seasoning. Continues to have some swelling in his lower legs but they look better. He admits to not elevating his legs much at home and he was encouraged to elevate them when he's home sitting down watching tv.  2: COPD- Appears to be stable at this time. Continues with his inhalers. Oxygen per above. 3: Pulmonary embolus- Patient is currently on eliquis twice daily. No increase in bruising/bleeding noted. Sees his cardiologist in Nov 2016. [facility] pharmacist is here to talk with patient  and granddaughter about his medications.   Return in 3 months or sooner for any questions/problems before then.

## 2015-09-15 ENCOUNTER — Observation Stay
Admission: EM | Admit: 2015-09-15 | Discharge: 2015-09-16 | Disposition: A | Discharge status: Home / Self Care | Payer: BLUE CROSS/BLUE SHIELD | Attending: Internal Medicine | Admitting: Internal Medicine

## 2015-09-15 ENCOUNTER — Encounter: Payer: Self-pay | Admitting: *Deleted

## 2015-09-15 ENCOUNTER — Emergency Department: Payer: BLUE CROSS/BLUE SHIELD

## 2015-09-15 DIAGNOSIS — K219 Gastro-esophageal reflux disease without esophagitis: Secondary | ICD-10-CM | POA: Insufficient documentation

## 2015-09-15 DIAGNOSIS — Z86711 Personal history of pulmonary embolism: Secondary | ICD-10-CM | POA: Insufficient documentation

## 2015-09-15 DIAGNOSIS — K5900 Constipation, unspecified: Secondary | ICD-10-CM | POA: Diagnosis not present

## 2015-09-15 DIAGNOSIS — J449 Chronic obstructive pulmonary disease, unspecified: Secondary | ICD-10-CM | POA: Insufficient documentation

## 2015-09-15 DIAGNOSIS — J45909 Unspecified asthma, uncomplicated: Secondary | ICD-10-CM | POA: Insufficient documentation

## 2015-09-15 DIAGNOSIS — R29810 Facial weakness: Secondary | ICD-10-CM | POA: Insufficient documentation

## 2015-09-15 DIAGNOSIS — Z9981 Dependence on supplemental oxygen: Secondary | ICD-10-CM | POA: Insufficient documentation

## 2015-09-15 DIAGNOSIS — Z7951 Long term (current) use of inhaled steroids: Secondary | ICD-10-CM | POA: Diagnosis not present

## 2015-09-15 DIAGNOSIS — E1122 Type 2 diabetes mellitus with diabetic chronic kidney disease: Secondary | ICD-10-CM | POA: Diagnosis not present

## 2015-09-15 DIAGNOSIS — Z8249 Family history of ischemic heart disease and other diseases of the circulatory system: Secondary | ICD-10-CM | POA: Insufficient documentation

## 2015-09-15 DIAGNOSIS — E7800 Pure hypercholesterolemia, unspecified: Secondary | ICD-10-CM | POA: Insufficient documentation

## 2015-09-15 DIAGNOSIS — R778 Other specified abnormalities of plasma proteins: Secondary | ICD-10-CM | POA: Diagnosis present

## 2015-09-15 DIAGNOSIS — R51 Headache: Secondary | ICD-10-CM | POA: Diagnosis not present

## 2015-09-15 DIAGNOSIS — Z87891 Personal history of nicotine dependence: Secondary | ICD-10-CM | POA: Insufficient documentation

## 2015-09-15 DIAGNOSIS — I130 Hypertensive heart and chronic kidney disease with heart failure and stage 1 through stage 4 chronic kidney disease, or unspecified chronic kidney disease: Secondary | ICD-10-CM | POA: Diagnosis not present

## 2015-09-15 DIAGNOSIS — Z86718 Personal history of other venous thrombosis and embolism: Secondary | ICD-10-CM | POA: Insufficient documentation

## 2015-09-15 DIAGNOSIS — R7989 Other specified abnormal findings of blood chemistry: Secondary | ICD-10-CM | POA: Diagnosis present

## 2015-09-15 DIAGNOSIS — N189 Chronic kidney disease, unspecified: Secondary | ICD-10-CM | POA: Insufficient documentation

## 2015-09-15 DIAGNOSIS — R079 Chest pain, unspecified: Secondary | ICD-10-CM | POA: Insufficient documentation

## 2015-09-15 DIAGNOSIS — Z91041 Radiographic dye allergy status: Secondary | ICD-10-CM | POA: Insufficient documentation

## 2015-09-15 DIAGNOSIS — Z7901 Long term (current) use of anticoagulants: Secondary | ICD-10-CM | POA: Diagnosis not present

## 2015-09-15 DIAGNOSIS — I4891 Unspecified atrial fibrillation: Secondary | ICD-10-CM | POA: Insufficient documentation

## 2015-09-15 DIAGNOSIS — I5032 Chronic diastolic (congestive) heart failure: Secondary | ICD-10-CM | POA: Diagnosis not present

## 2015-09-15 DIAGNOSIS — R748 Abnormal levels of other serum enzymes: Secondary | ICD-10-CM | POA: Diagnosis not present

## 2015-09-15 DIAGNOSIS — H1131 Conjunctival hemorrhage, right eye: Secondary | ICD-10-CM | POA: Diagnosis not present

## 2015-09-15 DIAGNOSIS — Z79899 Other long term (current) drug therapy: Secondary | ICD-10-CM | POA: Diagnosis not present

## 2015-09-15 LAB — DIFFERENTIAL
BASOS ABS: 0.1 10*3/uL (ref 0–0.1)
BASOS PCT: 1 %
EOS ABS: 0.1 10*3/uL (ref 0–0.7)
Eosinophils Relative: 1 %
Lymphocytes Relative: 13 %
Lymphs Abs: 0.9 10*3/uL — ABNORMAL LOW (ref 1.0–3.6)
Monocytes Absolute: 0.9 10*3/uL (ref 0.2–1.0)
Monocytes Relative: 12 %
NEUTROS PCT: 73 %
Neutro Abs: 5.3 10*3/uL (ref 1.4–6.5)

## 2015-09-15 LAB — CBC
HCT: 43.4 % (ref 40.0–52.0)
HEMOGLOBIN: 14.1 g/dL (ref 13.0–18.0)
MCH: 30.9 pg (ref 26.0–34.0)
MCHC: 32.5 g/dL (ref 32.0–36.0)
MCV: 95 fL (ref 80.0–100.0)
Platelets: 167 10*3/uL (ref 150–440)
RBC: 4.57 MIL/uL (ref 4.40–5.90)
RDW: 16.8 % — AB (ref 11.5–14.5)
WBC: 7.3 10*3/uL (ref 3.8–10.6)

## 2015-09-15 LAB — APTT: APTT: 33 s (ref 24–36)

## 2015-09-15 LAB — COMPREHENSIVE METABOLIC PANEL
ALBUMIN: 3.9 g/dL (ref 3.5–5.0)
ALT: 13 U/L — ABNORMAL LOW (ref 17–63)
AST: 24 U/L (ref 15–41)
Alkaline Phosphatase: 76 U/L (ref 38–126)
Anion gap: 7 (ref 5–15)
BUN: 19 mg/dL (ref 6–20)
CHLORIDE: 103 mmol/L (ref 101–111)
CO2: 23 mmol/L (ref 22–32)
Calcium: 10.2 mg/dL (ref 8.9–10.3)
Creatinine, Ser: 1.38 mg/dL — ABNORMAL HIGH (ref 0.61–1.24)
GFR calc Af Amer: 57 mL/min — ABNORMAL LOW (ref >60)
GFR calc non Af Amer: 49 mL/min — ABNORMAL LOW (ref >60)
GLUCOSE: 116 mg/dL — AB (ref 65–99)
POTASSIUM: 3.8 mmol/L (ref 3.5–5.1)
Sodium: 133 mmol/L — ABNORMAL LOW (ref 135–145)
Total Bilirubin: 0.7 mg/dL (ref 0.3–1.2)
Total Protein: 6.6 g/dL (ref 6.5–8.1)

## 2015-09-15 LAB — GLUCOSE, CAPILLARY
GLUCOSE-CAPILLARY: 78 mg/dL (ref 65–99)
Glucose-Capillary: 120 mg/dL — ABNORMAL HIGH (ref 65–99)

## 2015-09-15 LAB — TROPONIN I
Troponin I: 0.1 ng/mL — ABNORMAL HIGH (ref <0.031)
Troponin I: 0.11 ng/mL — ABNORMAL HIGH (ref <0.031)

## 2015-09-15 LAB — PROTIME-INR
INR: 1.4
Prothrombin Time: 17.4 seconds — ABNORMAL HIGH (ref 11.4–15.0)

## 2015-09-15 NOTE — ED Provider Notes (Signed)
[facility] Emergency Department Provider Note  ____________________________________________  Time seen: Approximately 7:51 PM  I have reviewed the triage vital signs and the nursing notes.   HISTORY  Chief Complaint Eye Problem    HPI Austin [name] is a 74 y.o. male patient reports she's been having pressure in the back of his head in the occiput for 2-3 days. Nothing seems to make it better or worse or worse today he woke up and noticed some subconjunctival hemorrhage in the right eye. He has no pain in the eye has no blurry vision and no pain with movement called his doctor and his doctor sent to the emergency room to be checked since she is on Eliquis for PE that he had. Nurse when she examined her patient in triage thought that the patient's cheek didn't puff out quite as well when he blew area to his cheeks and he came back into the emergency room was a code stroke. In the emergency room patient was able to puff out both cheeks equally and well. Patient's family said his smile which is little asymmetrical is normal for him. No numbness weakness or any other complaints. EKG was done which looked as though there might be some slight worsening of the ST segment depression and T waves in version so a troponin was ordered. The troponin was elevated at 0.1. I discussed this with Dr. Meryl Dare 8. Dr. Meryl Dare he was able to find 3 other troponins that were elevated the same amount or higher back in August.   Past Medical History  Diagnosis Date  . COPD (chronic obstructive pulmonary disease) (HCC)   . Gastric ulcer   . DVT (deep venous thrombosis) (HCC)   . PE (pulmonary embolism)   . Hypertension   . Hypercholesteremia   . Diabetes mellitus without complication (HCC)   . GERD (gastroesophageal reflux disease)   . Atrial fibrillation (HCC)   . Asthma     Patient Active Problem List   Diagnosis Date Noted  . SOB (shortness of breath) 08/24/2015  . Pulmonary  embolism (HCC) 08/24/2015  . 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 Type 2 diabetes mellitus (HCC) 08/16/2015  . HLD (hyperlipidemia) 08/16/2015  . GERD (gastroesophageal reflux disease) 08/16/2015  . Chest pain 08/16/2015  . Constipation 08/16/2015  . HTN (hypertension) 07/21/2015  . COPD (chronic obstructive pulmonary disease) with chronic bronchitis (HCC) 07/21/2015  . CHF (NYHA class IV, ACC/AHA stage D) (HCC) 07/04/2015  . CHF (congestive heart failure) (HCC) 07/04/2015    Past Surgical History  Procedure Laterality Date  . Hernia repair    . Prostate surgery      Current Outpatient Rx  Name  Route  Sig  Dispense  Refill  . albuterol (PROVENTIL) (2.5 MG/3ML) 0.083% nebulizer solution   Nebulization   Take 3 mLs (2.5 mg total) by nebulization every 6 (six) hours as needed for wheezing or shortness of breath.   75 mL   12   . albuterol-ipratropium (COMBIVENT) 18-103 MCG/ACT inhaler   Inhalation   Inhale 2 puffs into the lungs 4 (four) times daily.         Marland Kitchen apixaban (ELIQUIS) 5 MG TABS tablet   Oral   Take 1 tablet (5 mg total) by mouth 2 (two) times daily.   60 tablet   1   . budesonide-formoterol (SYMBICORT) 160-4.5 MCG/ACT inhaler   Inhalation   Inhale 2 puffs into the lungs 2 (two) times daily.         Marland Kitchen  furosemide (LASIX) 40 MG tablet   Oral   Take 1 tablet (40 mg total) by mouth 2 (two) times daily. Patient taking differently: Take 20 mg by mouth 2 (two) times daily.    60 tablet   5   . lisinopril (PRINIVIL,ZESTRIL) 40 MG tablet   Oral   Take 40 mg by mouth daily.         Marland Kitchen lovastatin (MEVACOR) 40 MG tablet   Oral   Take 40 mg by mouth daily with supper. Pt should take with food.         . metFORMIN (GLUMETZA) 500 MG (MOD) 24 hr tablet   Oral   Take 500 mg by mouth 2 (two) times daily with a meal.         . metoprolol tartrate (LOPRESSOR) 25 MG tablet   Oral   Take 12.5 mg by mouth 2 (two)  times daily.          . nicotine (NICODERM CQ - DOSED IN MG/24 HOURS) 21 mg/24hr patch   Transdermal   Place 1 patch (21 mg total) onto the skin daily.   28 patch   0   . nitroGLYCERIN (NITROSTAT) 0.4 MG SL tablet   Sublingual   Place 0.4 mg under the tongue every 5 (five) minutes x 3 doses as needed for chest pain. If no relief call 911 or go to emergency room.         Marland Kitchen omeprazole (PRILOSEC) 20 MG capsule   Oral   Take 20 mg by mouth 2 (two) times daily.         . polyethylene glycol (MIRALAX) packet   Oral   Take 17 g by mouth daily as needed for moderate constipation.   30 each   0   . potassium chloride SA (K-DUR,KLOR-CON) 20 MEQ tablet   Oral   Take 1 tablet (20 mEq total) by mouth daily.   30 tablet   0   . tiotropium (SPIRIVA) 18 MCG inhalation capsule   Inhalation   Place 1 capsule (18 mcg total) into inhaler and inhale daily.   30 capsule   0   . cefUROXime (CEFTIN) 500 MG tablet   Oral   Take 1 tablet (500 mg total) by mouth 2 (two) times daily with a meal.   16 tablet   0   . predniSONE (DELTASONE) 5 MG tablet   Oral   Take 1 tablet (5 mg total) by mouth daily with breakfast. 4 tabs day1; 3 tabs day2; 2 tabs day3; 1 tab day4,5 then stop   11 tablet   0     Allergies Contrast media  Family History  Problem Relation Age of Onset  . CAD Brother   . Congestive Heart Failure Brother     Social History Social History  Substance Use Topics  . Smoking status: Former Smoker -- 1.00 packs/day    Quit date: 07/04/2015  . Smokeless tobacco: None  . Alcohol Use: No    Review of Systems Constitutional: No fever/chills Eyes: No visual changes. ENT: No sore throat. Cardiovascular: Denies chest pain. Respiratory: Denies shortness of breath. Gastrointestinal: No abdominal pain.  No nausea, no vomiting.  No diarrhea.  No constipation. Genitourinary: Negative for dysuria. Musculoskeletal: Negative for back pain. Skin: Negative for  rash. Neurological: Negative for headaches, focal weakness or numbness  10-point ROS otherwise negative.  ____________________________________________   PHYSICAL EXAM:  VITAL SIGNS: ED Triage Vitals  Enc Vitals Group  BP 09/15/15 1713 124/82 mmHg     Pulse Rate 09/15/15 1713 71     Resp 09/15/15 1713 18     Temp 09/15/15 1713 97.7 F (36.5 C)     Temp Source 09/15/15 1713 Oral     SpO2 09/15/15 1713 96 %     Weight --      Height --      Head Cir --      Peak Flow --      Pain Score --      Pain Loc --      Pain Edu? --      Excl. in GC? --     Constitutional: Alert and oriented. Well appearing and in no acute distress. Eyes: Conjunctivae are normal except for a small subconjunctival hemorrhage in the right eye outer quadrant.Marland Kitchen PERRL. EOMI. Head: Atraumatic. Nose: No congestion/rhinnorhea. Mouth/Throat: Mucous membranes are moist.  Oropharynx non-erythematous. Neck: No stridor Cardiovascular: Normal rate, regular rhythm. Grossly normal heart sounds.  Good peripheral circulation. Respiratory: Normal respiratory effort.  No retractions. Lungs CTAB. Gastrointestinal: Soft and nontender. No distention. No abdominal bruits. No CVA tenderness. Musculoskeletal: No lower extremity tenderness nor edema.  No joint effusions. Neurologic:  Normal speech and language. No gross focal neurologic deficits are appreciated. No gait instability.. Cranial nerves II through XII are intact except for slight left facial droop which family reports is normal. Cerebellar finger-nose rapid alternating movements and hands are normal. Motor strength is 5 over 5 throughout sensation is intact throughout per patient. Skin:  Skin is warm, dry and intact. No rash noted.  ____________________________________________   LABS (all labs ordered are listed, but only abnormal results are displayed)  Labs Reviewed  PROTIME-INR - Abnormal; Notable for the following:    Prothrombin Time 17.4 (*)    All  other components within normal limits  CBC - Abnormal; Notable for the following:    RDW 16.8 (*)    All other components within normal limits  DIFFERENTIAL - Abnormal; Notable for the following:    Lymphs Abs 0.9 (*)    All other components within normal limits  COMPREHENSIVE METABOLIC PANEL - Abnormal; Notable for the following:    Sodium 133 (*)    Glucose, Bld 116 (*)    Creatinine, Ser 1.38 (*)    ALT 13 (*)    GFR calc non Af Amer 49 (*)    GFR calc Af Amer 57 (*)    All other components within normal limits  TROPONIN I - Abnormal; Notable for the following:    Troponin I 0.10 (*)    All other components within normal limits  GLUCOSE, CAPILLARY - Abnormal; Notable for the following:    Glucose-Capillary 120 (*)    All other components within normal limits  TROPONIN I - Abnormal; Notable for the following:    Troponin I 0.11 (*)    All other components within normal limits  APTT  GLUCOSE, CAPILLARY   ____________________________________________  EKG  EKG read and interpreted by me normal sinus rhythm at a rate of 68 left axis 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 It appears that this is somewhat worse than one done in September of this years beginning of September of this year. ____________________________________________  RADIOLOGY  CT of the head is read by radiology as no acute disease no stroke ____________________________________________   PROCEDURES  ____________________________________________   INITIAL IMPRESSION /  ASSESSMENT AND PLAN / ED COURSE  Pertinent labs & imaging results that were available during my care of the patient were reviewed by me and considered in my medical decision making (see chart for details).  ____________________________________________   FINAL CLINICAL IMPRESSION(S) / ED DIAGNOSES  Final diagnoses:  Troponin level elevated       Arnaldo Natal, MD 09/15/15 2320

## 2015-09-15 NOTE — ED Notes (Signed)
Dr. Darnelle Catalan notified of Troponin

## 2015-09-15 NOTE — ED Notes (Signed)
Spoke with dr. Darnelle Catalan about pt symptoms, pt transported to ct via wheelchair, charge nurse alerted to pt. Report given to primary rn

## 2015-09-15 NOTE — ED Notes (Signed)
CBG checked and noted to be 78. MD aware and meal tray given. Pt sitting up eating with assistance from family.

## 2015-09-15 NOTE — ED Notes (Signed)
Pt wife reports pt has had burst blood vessel in his right eye since about 2pm today. Pt is on coumadin. Pt doctor ([facility]) sent him here for further evaluation. No visual problems. Pt also c/o pain in the back of his head since yesterday, worse to movement of his head. 1 episode of nausea today, was resolved.

## 2015-09-16 DIAGNOSIS — R778 Other specified abnormalities of plasma proteins: Secondary | ICD-10-CM | POA: Diagnosis present

## 2015-09-16 DIAGNOSIS — R7989 Other specified abnormal findings of blood chemistry: Secondary | ICD-10-CM

## 2015-09-16 LAB — GLUCOSE, CAPILLARY
Glucose-Capillary: 88 mg/dL (ref 65–99)
Glucose-Capillary: 125 mg/dL — ABNORMAL HIGH (ref 65–99)

## 2015-09-16 LAB — TSH: TSH: 0.423 u[IU]/mL (ref 0.350–4.500)

## 2015-09-16 LAB — HEMOGLOBIN A1C: Hgb A1c MFr Bld: 6.6 % — ABNORMAL HIGH (ref 4.0–6.0)

## 2015-09-16 MED ORDER — ACETAMINOPHEN 325 MG PO TABS: 650.0000 mg | ORAL_TABLET | Freq: Four times a day (QID) | ORAL | Status: DC | PRN

## 2015-09-16 MED ORDER — SODIUM CHLORIDE 0.9 % IV SOLN
INTRAVENOUS | Status: DC
Start: 2015-09-16 — End: 2015-09-16
  Administered 2015-09-16: 03:00:00 via INTRAVENOUS

## 2015-09-16 MED ORDER — ONDANSETRON HCL 4 MG PO TABS
4.0000 mg | ORAL_TABLET | Freq: Four times a day (QID) | ORAL | Status: DC | PRN
Start: 2015-09-16 — End: 2015-09-16
  Administered 2015-09-16: 4 mg via ORAL
  Filled 2015-09-16: qty 1

## 2015-09-16 MED ORDER — MORPHINE SULFATE (PF) 2 MG/ML IV SOLN: 1.0000 mg | INTRAVENOUS | Status: DC | PRN

## 2015-09-16 MED ORDER — POTASSIUM CHLORIDE CRYS ER 20 MEQ PO TBCR
20.0000 meq | EXTENDED_RELEASE_TABLET | Freq: Every day | ORAL | Status: DC
  Administered 2015-09-16: 20 meq via ORAL
  Filled 2015-09-16: qty 1

## 2015-09-16 MED ORDER — NITROGLYCERIN 0.4 MG SL SUBL: 0.4000 mg | SUBLINGUAL_TABLET | SUBLINGUAL | Status: DC | PRN

## 2015-09-16 MED ORDER — TIOTROPIUM BROMIDE MONOHYDRATE 18 MCG IN CAPS
18.0000 ug | ORAL_CAPSULE | Freq: Every day | RESPIRATORY_TRACT | Status: DC
  Administered 2015-09-16: 18 ug via RESPIRATORY_TRACT
  Filled 2015-09-16 (×2): qty 5

## 2015-09-16 MED ORDER — ACETAMINOPHEN 650 MG RE SUPP: 650.0000 mg | Freq: Four times a day (QID) | RECTAL | Status: DC | PRN

## 2015-09-16 MED ORDER — NICOTINE 21 MG/24HR TD PT24
21.0000 mg | MEDICATED_PATCH | Freq: Every day | TRANSDERMAL | Status: DC
  Filled 2015-09-16: qty 1

## 2015-09-16 MED ORDER — ALBUTEROL SULFATE (2.5 MG/3ML) 0.083% IN NEBU: 2.5000 mg | INHALATION_SOLUTION | Freq: Four times a day (QID) | RESPIRATORY_TRACT | Status: DC | PRN

## 2015-09-16 MED ORDER — APIXABAN 5 MG PO TABS
5.0000 mg | ORAL_TABLET | Freq: Two times a day (BID) | ORAL | Status: DC
  Administered 2015-09-16 (×2): 5 mg via ORAL
  Filled 2015-09-16 (×2): qty 1

## 2015-09-16 MED ORDER — LISINOPRIL 20 MG PO TABS
40.0000 mg | ORAL_TABLET | Freq: Every day | ORAL | Status: DC
  Administered 2015-09-16: 40 mg via ORAL
  Filled 2015-09-16: qty 2

## 2015-09-16 MED ORDER — FUROSEMIDE 20 MG PO TABS
20.0000 mg | ORAL_TABLET | Freq: Two times a day (BID) | ORAL | Status: DC
  Administered 2015-09-16: 20 mg via ORAL
  Filled 2015-09-16: qty 1

## 2015-09-16 MED ORDER — POLYETHYLENE GLYCOL 3350 17 G PO PACK: 17.0000 g | PACK | Freq: Every day | ORAL | Status: DC | PRN

## 2015-09-16 MED ORDER — ONDANSETRON HCL 4 MG/2ML IJ SOLN: 4.0000 mg | Freq: Four times a day (QID) | INTRAMUSCULAR | Status: DC | PRN

## 2015-09-16 MED ORDER — DOCUSATE SODIUM 100 MG PO CAPS
100.0000 mg | ORAL_CAPSULE | Freq: Two times a day (BID) | ORAL | Status: DC
  Administered 2015-09-16 (×2): 100 mg via ORAL
  Filled 2015-09-16 (×2): qty 1

## 2015-09-16 MED ORDER — SODIUM CHLORIDE 0.9 % IJ SOLN
3.0000 mL | Freq: Two times a day (BID) | INTRAMUSCULAR | Status: DC
  Administered 2015-09-16 (×2): 3 mL via INTRAVENOUS

## 2015-09-16 MED ORDER — INSULIN ASPART 100 UNIT/ML ~~LOC~~ SOLN
0.0000 [IU] | Freq: Three times a day (TID) | SUBCUTANEOUS | Status: DC
  Administered 2015-09-16: 1 [IU] via SUBCUTANEOUS
  Filled 2015-09-16: qty 1

## 2015-09-16 MED ORDER — BUDESONIDE-FORMOTEROL FUMARATE 160-4.5 MCG/ACT IN AERO
2.0000 | INHALATION_SPRAY | Freq: Two times a day (BID) | RESPIRATORY_TRACT | Status: DC
  Administered 2015-09-16: 2 via RESPIRATORY_TRACT
  Filled 2015-09-16 (×2): qty 6

## 2015-09-16 MED ORDER — ALBUTEROL SULFATE (2.5 MG/3ML) 0.083% IN NEBU: 3.0000 mL | INHALATION_SOLUTION | RESPIRATORY_TRACT | Status: DC | PRN

## 2015-09-16 MED ORDER — PRAVASTATIN SODIUM 20 MG PO TABS: 40.0000 mg | ORAL_TABLET | Freq: Every day | ORAL | Status: DC

## 2015-09-16 MED ORDER — METOPROLOL TARTRATE 25 MG PO TABS
12.5000 mg | ORAL_TABLET | Freq: Two times a day (BID) | ORAL | Status: DC
  Filled 2015-09-16 (×2): qty 1

## 2015-09-16 MED ORDER — INSULIN ASPART 100 UNIT/ML ~~LOC~~ SOLN: 0.0000 [IU] | Freq: Every day | SUBCUTANEOUS | Status: DC

## 2015-09-16 NOTE — H&P (Signed)
Austin [name] is an 74 y.o. male.   Chief Complaint: Burst blood vessel in eye HPI: The patient presents emergency department upon the request of his primary care doctor due to a ruptured blood vessel in his right eye. The patient is on a lupus for history of pulmonary embolism. At the time of presentation the patient complained of some occipital headache but otherwise has no complaints. He denies chest pain, shortness of breath (although he does use supplemental oxygen at home if he is symptomatic), nausea, vomiting or diaphoresis. Routine laboratory violation revealed an elevation in the patient's troponin contact emergency Department to call for admission to rule out myocardial ischemia.  Past Medical History  Diagnosis Date  . COPD (chronic obstructive pulmonary disease) ([place])   . Gastric ulcer   . DVT (deep venous thrombosis) ([place])   . PE (pulmonary embolism)   . Hypertension   . Hypercholesteremia   . Diabetes mellitus without complication ([place])   . GERD (gastroesophageal reflux disease)   . Atrial fibrillation ([place])   . Asthma     Past Surgical History  Procedure Laterality Date  . Hernia repair    . Prostate surgery      Family History  Problem Relation Age of Onset  . CAD Brother   . Congestive Heart Failure Brother    Social History:  reports that he quit smoking about 2 months ago. He does not have any smokeless tobacco history on file. He reports that he does not drink alcohol or use illicit drugs.  Allergies:  Allergies  Allergen Reactions  . Contrast Media [Iodinated Diagnostic Agents] Nausea And Vomiting    Medications Prior to Admission  Medication Sig Dispense Refill  . albuterol (PROVENTIL) (2.5 MG/3ML) 0.083% nebulizer solution Take 3 mLs (2.5 mg total) by nebulization every 6 (six) hours as needed for wheezing or shortness of breath. 75 mL 12  . albuterol-ipratropium (COMBIVENT) 18-103 MCG/ACT inhaler Inhale 2 puffs into the lungs 4 (four) times  daily.    Marland Kitchen apixaban (ELIQUIS) 5 MG TABS tablet Take 1 tablet (5 mg total) by mouth 2 (two) times daily. 60 tablet 1  . budesonide-formoterol (SYMBICORT) 160-4.5 MCG/ACT inhaler Inhale 2 puffs into the lungs 2 (two) times daily.    . furosemide (LASIX) 40 MG tablet Take 1 tablet (40 mg total) by mouth 2 (two) times daily. (Patient taking differently: Take 20 mg by mouth 2 (two) times daily. ) 60 tablet 5  . glipiZIDE (GLUCOTROL XL) 2.5 MG 24 hr tablet Take 2.5 mg by mouth daily with breakfast.    . lisinopril (PRINIVIL,ZESTRIL) 40 MG tablet Take 40 mg by mouth daily.    Marland Kitchen lovastatin (MEVACOR) 40 MG tablet Take 40 mg by mouth daily with supper. Pt should take with food.    . metFORMIN (GLUMETZA) 500 MG (MOD) 24 hr tablet Take 500 mg by mouth 2 (two) times daily with a meal.    . metoprolol tartrate (LOPRESSOR) 25 MG tablet Take 12.5 mg by mouth 2 (two) times daily.     . nicotine (NICODERM CQ - DOSED IN MG/24 HOURS) 21 mg/24hr patch Place 1 patch (21 mg total) onto the skin daily. 28 patch 0  . nitroGLYCERIN (NITROSTAT) 0.4 MG SL tablet Place 0.4 mg under the tongue every 5 (five) minutes x 3 doses as needed for chest pain. If no relief call 911 or go to emergency room.    Marland Kitchen omeprazole (PRILOSEC) 20 MG capsule Take 20 mg by mouth 2 (two)  times daily.    . polyethylene glycol (MIRALAX) packet Take 17 g by mouth daily as needed for moderate constipation. 30 each 0  . potassium chloride SA (K-DUR,KLOR-CON) 20 MEQ tablet Take 1 tablet (20 mEq total) by mouth daily. 30 tablet 0  . tiotropium (SPIRIVA) 18 MCG inhalation capsule Place 1 capsule (18 mcg total) into inhaler and inhale daily. 30 capsule 0  . cefUROXime (CEFTIN) 500 MG tablet Take 1 tablet (500 mg total) by mouth 2 (two) times daily with a meal. 16 tablet 0  . predniSONE (DELTASONE) 5 MG tablet Take 1 tablet (5 mg total) by mouth daily with breakfast. 4 tabs day1; 3 tabs day2; 2 tabs day3; 1 tab day4,5 then stop 11 tablet 0    Results for  orders placed or performed during the hospital encounter of 09/15/15 (from the past 48 hour(s))  Glucose, capillary     Status: Abnormal   Collection Time: 09/15/15  5:28 PM  Result Value Ref Range   Glucose-Capillary 120 (H) 65 - 99 mg/dL  Protime-INR     Status: Abnormal   Collection Time: 09/15/15  5:33 PM  Result Value Ref Range   Prothrombin Time 17.4 (H) 11.4 - 15.0 seconds   INR 1.40   APTT     Status: None   Collection Time: 09/15/15  5:33 PM  Result Value Ref Range   aPTT 33 24 - 36 seconds  CBC     Status: Abnormal   Collection Time: 09/15/15  5:33 PM  Result Value Ref Range   WBC 7.3 3.8 - 10.6 K/uL   RBC 4.57 4.40 - 5.90 MIL/uL   Hemoglobin 14.1 13.0 - 18.0 g/dL   HCT 43.4 40.0 - 52.0 %   MCV 95.0 80.0 - 100.0 fL   MCH 30.9 26.0 - 34.0 pg   MCHC 32.5 32.0 - 36.0 g/dL   RDW 16.8 (H) 11.5 - 14.5 %   Platelets 167 150 - 440 K/uL  Differential     Status: Abnormal   Collection Time: 09/15/15  5:33 PM  Result Value Ref Range   Neutrophils Relative % 73 %   Neutro Abs 5.3 1.4 - 6.5 K/uL   Lymphocytes Relative 13 %   Lymphs Abs 0.9 (L) 1.0 - 3.6 K/uL   Monocytes Relative 12 %   Monocytes Absolute 0.9 0.2 - 1.0 K/uL   Eosinophils Relative 1 %   Eosinophils Absolute 0.1 0 - 0.7 K/uL   Basophils Relative 1 %   Basophils Absolute 0.1 0 - 0.1 K/uL  Comprehensive metabolic panel     Status: Abnormal   Collection Time: 09/15/15  5:33 PM  Result Value Ref Range   Sodium 133 (L) 135 - 145 mmol/L   Potassium 3.8 3.5 - 5.1 mmol/L   Chloride 103 101 - 111 mmol/L   CO2 23 22 - 32 mmol/L   Glucose, Bld 116 (H) 65 - 99 mg/dL   BUN 19 6 - 20 mg/dL   Creatinine, Ser 1.38 (H) 0.61 - 1.24 mg/dL   Calcium 10.2 8.9 - 10.3 mg/dL   Total Protein 6.6 6.5 - 8.1 g/dL   Albumin 3.9 3.5 - 5.0 g/dL   AST 24 15 - 41 U/L   ALT 13 (L) 17 - 63 U/L   Alkaline Phosphatase 76 38 - 126 U/L   Total Bilirubin 0.7 0.3 - 1.2 mg/dL   GFR calc non Af Amer 49 (L) >60 mL/min   GFR calc Af Amer 57  (L) >  60 mL/min    Comment: (NOTE) The eGFR has been calculated using the CKD EPI equation. This calculation has not been validated in all clinical situations. eGFR's persistently <60 mL/min signify possible Chronic Kidney Disease.    Anion gap 7 5 - 15  Troponin I     Status: Abnormal   Collection Time: 09/15/15  5:33 PM  Result Value Ref Range   Troponin I 0.10 (H) <0.031 ng/mL    Comment: READ BACK AND VERIFIED WITH JANEY  BOWEN AT 1830 09/15/15 SDR        PERSISTENTLY INCREASED TROPONIN VALUES IN THE RANGE OF 0.04-0.49 ng/mL CAN BE SEEN IN:       -UNSTABLE ANGINA       -CONGESTIVE HEART FAILURE       -MYOCARDITIS       -CHEST TRAUMA       -ARRYHTHMIAS       -LATE PRESENTING MYOCARDIAL INFARCTION       -COPD   CLINICAL FOLLOW-UP RECOMMENDED.   Troponin I     Status: Abnormal   Collection Time: 09/15/15  9:45 PM  Result Value Ref Range   Troponin I 0.11 (H) <0.031 ng/mL    Comment: RESULTS PREVIOUSLY CALLED 09/15/15 _0  BY SDR.Marland KitchenMarland KitchenAJO        PERSISTENTLY INCREASED TROPONIN VALUES IN THE RANGE OF 0.04-0.49 ng/mL CAN BE SEEN IN:       -UNSTABLE ANGINA       -CONGESTIVE HEART FAILURE       -MYOCARDITIS       -CHEST TRAUMA       -ARRYHTHMIAS       -LATE PRESENTING MYOCARDIAL INFARCTION       -COPD   CLINICAL FOLLOW-UP RECOMMENDED.   TSH     Status: None   Collection Time: 09/15/15  9:45 PM  Result Value Ref Range   TSH 0.423 0.350 - 4.500 uIU/mL  Glucose, capillary     Status: None   Collection Time: 09/15/15 10:00 PM  Result Value Ref Range   Glucose-Capillary 78 65 - 99 mg/dL   Ct Head Wo Contrast  09/15/2015   CLINICAL DATA:  Blood shot right eye and right-sided facial drooping starting around 2 o'clock today.  EXAM: CT HEAD WITHOUT CONTRAST  TECHNIQUE: Contiguous axial images were obtained from the base of the skull through the vertex without intravenous contrast.  COMPARISON:  09/11/2011  FINDINGS: Similar findings of mild atrophy with sulcal prominence and  mild centralized volume loss with commensurate ex vacuo dilatation of the ventricular system. The gray-white differentiation is otherwise well maintained without CT evidence of acute large territory infarct. No intraparenchymal or extra-axial mass or hemorrhage. Normal size and configuration of the ventricles and basilar cisterns. No midline shift. Limited visualization of the paranasal sinuses demonstrates postsurgical change involving the lateral walls of the bilateral ethmoidal air cells. No air-fluid levels. Regional soft tissues appear normal. No displaced calvarial fracture.  IMPRESSION: Similar findings of atrophy without acute intracranial process.  Critical Value/emergent results were called by telephone at the time of interpretation on 09/15/2015 at 5:28 pm to Dr. Conni Slipper , who verbally acknowledged these results.   Electronically Signed   By: Sandi Mariscal M.D.   On: 09/15/2015 17:28    Review of Systems  Constitutional: Negative for fever and chills.  HENT: Negative for sore throat and tinnitus.   Eyes: Negative for blurred vision and redness.  Respiratory: Negative for cough and shortness of breath.   Cardiovascular: Negative for  chest pain, palpitations, orthopnea and PND.  Gastrointestinal: Negative for nausea, vomiting, abdominal pain and diarrhea.  Genitourinary: Negative for dysuria, urgency and frequency.  Musculoskeletal: Negative for myalgias and joint pain.  Skin: Negative for rash.       No lesions  Neurological: Negative for speech change, focal weakness and weakness.  Endo/Heme/Allergies: Does not bruise/bleed easily.       No temperature intolerance  Psychiatric/Behavioral: Negative for depression and suicidal ideas.    Blood pressure 137/77, pulse 58, temperature 98 F (36.7 C), temperature source Oral, resp. rate 16, weight 78.608 kg (173 lb 4.8 oz), SpO2 93 %. Physical Exam  Nursing note and vitals reviewed. Constitutional: He is oriented to person, place, and  time. He appears well-developed and well-nourished. No distress.  HENT:  Head: Normocephalic and atraumatic.  Mouth/Throat: Oropharynx is clear and moist.  Eyes: Conjunctivae and EOM are normal. Pupils are equal, round, and reactive to light. No scleral icterus.  Neck: Normal range of motion. Neck supple. No JVD present. No tracheal deviation present. No thyromegaly present.  Cardiovascular: Normal rate and normal heart sounds.  Exam reveals no gallop and no friction rub.   No murmur heard. Respiratory: Effort normal and breath sounds normal. No respiratory distress.  GI: Soft. Bowel sounds are normal. He exhibits no distension. There is no tenderness.  Genitourinary:  Deferred  Musculoskeletal: Normal range of motion. He exhibits no edema.  Lymphadenopathy:    He has no cervical adenopathy.  Neurological: He is alert and oriented to person, place, and time. No cranial nerve deficit.  Skin: Skin is warm and dry. No rash noted. No erythema.  Psychiatric: He has a normal mood and affect. His behavior is normal. Judgment and thought content normal.     Assessment/Plan This is a 74 year old African American male admitted for elevated troponin. 1. Elevated troponin: The patient is chest pain-free. He denies symptoms associated with myocardial ischemia. Indeed he's had similar elevations in the past. Renal function likely contributes to his clearance of cardiac biomarkers. Continue to trend enzymes. I have placed a cardiology consult for the morning. Monitor telemetry. 2. Hypertension: Continue lisinopril 3. CHF: Stable, systolic. Continue Lasix 4. COPD: Continue Symbicort  5. Pulmonary embolism: Continue Eliquis 6. DVT prophylaxis: As above 7. GI prophylaxis: None The patient is a full code. Time spent on admission was approximately 35 minutes  Harrie Foreman 09/16/2015, 4:01 AM

## 2015-09-16 NOTE — Care Management Note (Signed)
Case Management Note  Patient Details  Name: Austin [name] MRN: [id number] Date of Birth: 1941/06/30  Subjective/Objective:                  Met with patient to discuss discharge planning. He states he has used [place] in the past but does not feel he need home health this visit. He states he is on Eliquis and he used a  30-day coupon last visit and was able to obtain it for free. I contacted patient's [place] [phone] to run a 30-day (Eliquis 57m BID #60) and after insurance it will cost him $7.40. Patient advised. He states he can afford his other medications. He states he lives with his wife and is independent with daily activities. [facility] that he routinely see his PCP.   Action/Plan: No RNCM needs. Case closed.   Expected Discharge Date:                  Expected Discharge Plan:     In-House Referral:     Discharge planning Services  CM Consult, Medication Assistance  Post Acute Care Choice:    Choice offered to:  Patient  DME Arranged:  N/A DME Agency:     HH [place]    [place]     Status of Service:  In process, will continue to follow  Medicare Important Message Given:    Date Medicare IM Given:    Medicare IM give by:    Date Additional Medicare IM Given:    Additional Medicare Important Message give by:     If discussed at [place], dates discussed:    Additional Comments:  AMarshell Garfinkel RN 09/16/2015, 1:46 PM

## 2015-09-16 NOTE — Progress Notes (Signed)
Patient is to be discharged today. Patient is in no acute distress at this time, and assessment is unchanged from this morning. Patient's IV is out, discharge paperwork has been discussed with patient/family and there are no questions or concerns at this time. Patient will be accompanied downstairs by staff and family via wheelchair.   

## 2015-09-16 NOTE — Progress Notes (Signed)
Patient admitted to room 235 with a diagnosis of elevated troponin. Alert and oriented and able to answer the RN questions. Denied any acute pain. No respiratory distress noted. Neuro checks in progress, no deficit noted except facial asymmetry ( which the patient stated is chronic). Skin assessment done with Adriennne W. RN. Noted abrasion on right lower extremities and bilateral dry flaky feet.  Patient HR ranges from 51-58, and has Metoprolol 12.5 mg oral to be administered. Dr. Clint Guy notified with a new order to hold the Metoprolol for tonight. The RN raised concern about patient being CHF and is scheduled to receive NS at 100 mL/hr. DR. Hower indicated ttat the NS should be D/C. Will continue to monitor.

## 2015-09-16 NOTE — Consult Note (Addendum)
[facility] CLINIC CARDIOLOGY [facility]  CARDIOLOGY CONSULT NOTE  Patient ID: Austin [name] MRN: [id number] DOB/AGE: Apr 24, 1941 74 y.o.  Admit date: 09/15/2015 Referring Physician Dr. Sherryll Burger Primary Physician [facility] Primary Cardiologist Dr. Juliann Pares Reason for Consultation Abnormal troponin  HPI: Patient is a 74 year old male with history of COPD as well as history of deep vein thromboses as well as history of hypertension and diabetes. He currently is on apixiban for the dvt. He presented to the er with complaints of drooping and difficulty seeing. He was concerned that he was bleeding. He denied any chest pain or shortness of breath. He is on home O2 and is followed by the heart failure clinic. He has a known ejection fraction of 55-65% consistent with diastolic heart failure. He had a recent functional study earlier in the fall with no evidence of ischemia. He had a mild serum troponin elevation is 0.1. He has had mild elevation of serum troponins in the past. He has no evidence of ischemia on electrocardiogram. He had no chest pain or shortness of breath. He feels back to his baseline is anxious to go home. CT of the brain revealed atrophy with no acute intracranial process. He has chronic kidney disease with a GFR of 57. His serum creatinine is 1.38. He currently denies any chest pain shortness of breath syncope or presyncope. EKG reveals normal sinus rhythm with first-degree AV block. There is no change from baseline. There is no arrhythmia.  ROS Review of Systems - History obtained from chart review and the patient General ROS: positive for  - Visual changes and swelling in his right eye Respiratory ROS: no cough, shortness of breath, or wheezing Cardiovascular ROS: no chest pain or dyspnea on exertion Musculoskeletal ROS: negative Neurological ROS: Some drooping of his eye with some visual changes which have stabilized   Past Medical History  Diagnosis  Date  . COPD (chronic obstructive pulmonary disease) ([facility])   . Gastric ulcer   . DVT (deep venous thrombosis) ([facility])   . PE (pulmonary embolism)   . Hypertension   . Hypercholesteremia   . Diabetes mellitus without complication ([facility])   . GERD (gastroesophageal reflux disease)   . Atrial fibrillation ([facility])   . Asthma     Family History  Problem Relation Age of Onset  . CAD Brother   . Congestive Heart Failure Brother     Social History   Social History  . Marital Status: Married    Spouse Name: N/A  . Number of Children: N/A  . Years of Education: N/A   Occupational History  . Not on file.   Social History Main Topics  . Smoking status: Former Smoker -- 1.00 packs/day    Quit date: 07/04/2015  . Smokeless tobacco: Not on file  . Alcohol Use: No  . Drug Use: No  . Sexual Activity: Not on file   Other Topics Concern  . Not on file   Social History Narrative    Past Surgical History  Procedure Laterality Date  . Hernia repair    . Prostate surgery       Prescriptions prior to admission  Medication Sig Dispense Refill Last Dose  . albuterol (PROVENTIL) (2.5 MG/3ML) 0.083% nebulizer solution Take 3 mLs (2.5 mg total) by nebulization every 6 (six) hours as needed for wheezing or shortness of breath. 75 mL 12 09/15/2015 at 0630am  . albuterol-ipratropium (COMBIVENT) 18-103 MCG/ACT inhaler Inhale 2 puffs into the lungs 4 (four) times daily.  09/15/2015 at Unknown time  . apixaban (ELIQUIS) 5 MG TABS tablet Take 1 tablet (5 mg total) by mouth 2 (two) times daily. 60 tablet 1 09/15/2015 at 063 am  . budesonide-formoterol (SYMBICORT) 160-4.5 MCG/ACT inhaler Inhale 2 puffs into the lungs 2 (two) times daily.   09/15/2015 at Unknown time  . furosemide (LASIX) 40 MG tablet Take 1 tablet (40 mg total) by mouth 2 (two) times daily. (Patient taking differently: Take 20 mg by mouth 2 (two) times daily. ) 60 tablet 5 09/15/2015 at 0630am  . glipiZIDE (GLUCOTROL XL) 2.5 MG 24 hr tablet  Take 2.5 mg by mouth daily with breakfast.   09/15/2015 at 0630  . lisinopril (PRINIVIL,ZESTRIL) 40 MG tablet Take 40 mg by mouth daily.   09/15/2015 at Unknown time  . lovastatin (MEVACOR) 40 MG tablet Take 40 mg by mouth daily with supper. Pt should take with food.   09/14/2015 at Unknown time  . metFORMIN (GLUMETZA) 500 MG (MOD) 24 hr tablet Take 500 mg by mouth 2 (two) times daily with a meal.   Taking  . metoprolol tartrate (LOPRESSOR) 25 MG tablet Take 12.5 mg by mouth 2 (two) times daily.    09/15/2015 at Unknown time  . nicotine (NICODERM CQ - DOSED IN MG/24 HOURS) 21 mg/24hr patch Place 1 patch (21 mg total) onto the skin daily. 28 patch 0 09/14/2015 at Unknown time  . nitroGLYCERIN (NITROSTAT) 0.4 MG SL tablet Place 0.4 mg under the tongue every 5 (five) minutes x 3 doses as needed for chest pain. If no relief call 911 or go to emergency room.   PRN  . omeprazole (PRILOSEC) 20 MG capsule Take 20 mg by mouth 2 (two) times daily.   09/15/2015 at Unknown time  . polyethylene glycol (MIRALAX) packet Take 17 g by mouth daily as needed for moderate constipation. 30 each 0 09/14/2015 at Unknown time  . potassium chloride SA (K-DUR,KLOR-CON) 20 MEQ tablet Take 1 tablet (20 mEq total) by mouth daily. 30 tablet 0 09/14/2015 at Unknown time  . tiotropium (SPIRIVA) 18 MCG inhalation capsule Place 1 capsule (18 mcg total) into inhaler and inhale daily. 30 capsule 0 09/14/2015 at Unknown time  . cefUROXime (CEFTIN) 500 MG tablet Take 1 tablet (500 mg total) by mouth 2 (two) times daily with a meal. 16 tablet 0 Taking  . predniSONE (DELTASONE) 5 MG tablet Take 1 tablet (5 mg total) by mouth daily with breakfast. 4 tabs day1; 3 tabs day2; 2 tabs day3; 1 tab day4,5 then stop 11 tablet 0 Taking    Physical Exam: Blood pressure 140/89, pulse 53, temperature 97.8 F (36.6 C), temperature source Oral, resp. rate 16, height 6' (1.829 m), weight 78.608 kg (173 lb 4.8 oz), SpO2 98 %.    General appearance: alert,  cooperative and appears stated age Eyes: positive findings: eyelids/periorbital: ptosis on the right and conjunctiva: 1+ allergic conjunctivitis Resp: clear to auscultation bilaterally Cardio: regular rate and rhythm GI: soft, non-tender; bowel sounds normal; no masses,  no organomegaly Extremities: extremities normal, atraumatic, no cyanosis or edema Neurologic: Grossly normal Labs:   Lab Results  Component Value Date   WBC 7.3 09/15/2015   HGB 14.1 09/15/2015   HCT 43.4 09/15/2015   MCV 95.0 09/15/2015   PLT 167 09/15/2015    Recent Labs Lab 09/15/15 1733  NA 133*  K 3.8  CL 103  CO2 23  BUN 19  CREATININE 1.38*  CALCIUM 10.2  PROT 6.6  BILITOT 0.7  ALKPHOS 76  ALT 13*  AST 24  GLUCOSE 116*   Lab Results  Component Value Date   TROPONINI 0.11* 09/15/2015      Radiology: Head CT revealed atrophy but no acute process.  EKG: Sinus rhythm with nonspecific ST-T wave changes no change from baseline of significance.  ASSESSMENT AND PLAN:  Patient with a history of congestive heart failure felt to be diastolic heart failure, chronic obstructive pulmonary disease on chronic oxygen and Lasix, history of atrial fibrillation and hypertension who was recently admitted with COPD flare. He had a functional study approximately one month ago showing no ischemia with preserved LV function. He was admitted with complaints of visual changes and he was concerned about possible intracranial bleed. Head CT was negative for this. He had a mild serum troponin elevation which appears to be demand ischemia in the face of chronic kidney disease. He does not appear to be acutely ischemic. He is on apixaban for DVT and is tolerating this well. No further cardiac workup appears indicated at present. Patient feels that his baseline is anxious to go home. Patient should remain on apixaban for DVT as he is currently doing. Would be okay for discharge from a cardiac standpoint. He can follow-up with Dr.  Nydia Bouton in one week. Signed: Dalia Heading MD, [facility] 09/16/2015, 12:44 PM

## 2015-09-16 NOTE — Progress Notes (Signed)
    Called for consult of elevated troponin in the setting of visual disturbance. Patient is established with Dr. Juliann Pares, MD. Notified 2A staff to change consult.   Eula Listen, PA-C 09/16/2015 9:36 AM

## 2015-09-18 NOTE — Discharge Summary (Signed)
[facility] - [place] at [facility]   PATIENT NAME: Austin [name]    MR#:  [id number]  DATE OF BIRTH:  1941/06/05  DATE OF ADMISSION:  09/15/2015 ADMITTING PHYSICIAN: Arnaldo Natal, MD  DATE OF DISCHARGE: 09/16/2015  3:21 PM  PRIMARY CARE PHYSICIAN: [facility]    ADMISSION DIAGNOSIS:  Troponin level elevated [R79.89] DISCHARGE DIAGNOSIS:  Active Problems:   Elevated troponin due to supply demand ischemia SECONDARY DIAGNOSIS:   Past Medical History  Diagnosis Date  . COPD (chronic obstructive pulmonary disease) (HCC)   . Gastric ulcer   . DVT (deep venous thrombosis) (HCC)   . PE (pulmonary embolism)   . Hypertension   . Hypercholesteremia   . Diabetes mellitus without complication (HCC)   . GERD (gastroesophageal reflux disease)   . Atrial fibrillation (HCC)   . Asthma    HOSPITAL COURSE:  74 year old African American male sent by his PCP's office for ruptured blood vessel in his right eye to ED and was found to have elevated troponin.for which he was admitted. Please see Dr Elias Else dictated [facility] & P for further details. He was ruled out with serial troponins and cardiology c/s was obtained with Dr Lady Gary. He had a functional study approximately one month ago showing no ischemia with preserved LV function so no further work up was recommended.  He was discharged home in stable condition and was agreeable with D/C plans. DISCHARGE CONDITIONS:  stable CONSULTS OBTAINED:  Treatment Team:  Dalia Heading, MD DRUG ALLERGIES:   Allergies  Allergen Reactions  . Contrast Media [Iodinated Diagnostic Agents] Nausea And Vomiting   DISCHARGE MEDICATIONS:   Discharge Medication List as of 09/16/2015  2:33 PM    CONTINUE these medications which have NOT CHANGED   Details  albuterol (PROVENTIL) (2.5 MG/3ML) 0.083% nebulizer solution Take 3 mLs (2.5 mg total) by nebulization every 6 (six) hours as needed for wheezing or  shortness of breath., Starting 07/08/2015, Until Discontinued, Normal    albuterol-ipratropium (COMBIVENT) 18-103 MCG/ACT inhaler Inhale 2 puffs into the lungs 4 (four) times daily., Until Discontinued, Historical Med    apixaban (ELIQUIS) 5 MG TABS tablet Take 1 tablet (5 mg total) by mouth 2 (two) times daily., Starting 09/02/2015, Until Discontinued, Print    budesonide-formoterol (SYMBICORT) 160-4.5 MCG/ACT inhaler Inhale 2 puffs into the lungs 2 (two) times daily., Until Discontinued, Historical Med    furosemide (LASIX) 40 MG tablet Take 1 tablet (40 mg total) by mouth 2 (two) times daily., Starting 07/21/2015, Until Discontinued, Normal    glipiZIDE (GLUCOTROL XL) 2.5 MG 24 hr tablet Take 2.5 mg by mouth daily with breakfast., Until Discontinued, Historical Med    lisinopril (PRINIVIL,ZESTRIL) 40 MG tablet Take 40 mg by mouth daily., Until Discontinued, Historical Med    lovastatin (MEVACOR) 40 MG tablet Take 40 mg by mouth daily with supper. Pt should take with food., Until Discontinued, Historical Med    metFORMIN (GLUMETZA) 500 MG (MOD) 24 hr tablet Take 500 mg by mouth 2 (two) times daily with a meal., Until Discontinued, Historical Med    metoprolol tartrate (LOPRESSOR) 25 MG tablet Take 12.5 mg by mouth 2 (two) times daily. , Until Discontinued, Historical Med    nicotine (NICODERM CQ - DOSED IN MG/24 HOURS) 21 mg/24hr patch Place 1 patch (21 mg total) onto the skin daily., Starting 07/08/2015, Until Discontinued, Normal    nitroGLYCERIN (NITROSTAT) 0.4 MG SL tablet Place 0.4 mg under the tongue every  5 (five) minutes x 3 doses as needed for chest pain. If no relief call 911 or go to emergency room., Until Discontinued, Historical Med    omeprazole (PRILOSEC) 20 MG capsule Take 20 mg by mouth 2 (two) times daily., Until Discontinued, Historical Med    polyethylene glycol (MIRALAX) packet Take 17 g by mouth daily as needed for moderate constipation., Starting 08/17/2015, Until  Discontinued, Normal    potassium chloride SA (K-DUR,KLOR-CON) 20 MEQ tablet Take 1 tablet (20 mEq total) by mouth daily., Starting 07/08/2015, Until Discontinued, Normal    tiotropium (SPIRIVA) 18 MCG inhalation capsule Place 1 capsule (18 mcg total) into inhaler and inhale daily., Starting 07/08/2015, Until Discontinued, Normal      STOP taking these medications     cefUROXime (CEFTIN) 500 MG tablet      predniSONE (DELTASONE) 5 MG tablet        DISCHARGE INSTRUCTIONS:   DIET:  Cardiac diet DISCHARGE CONDITION:  Good ACTIVITY:  Activity as tolerated OXYGEN:  Home Oxygen: No.  Oxygen Delivery: room air DISCHARGE LOCATION:  home   If you experience worsening of your admission symptoms, develop shortness of breath, life threatening emergency, suicidal or homicidal thoughts you must seek medical attention immediately by calling 911 or calling your MD immediately  if symptoms less severe.  You Must read complete instructions/literature along with all the possible adverse reactions/side effects for all the Medicines you take and that have been prescribed to you. Take any new Medicines after you have completely understood and accpet all the possible adverse reactions/side effects.   Please note  You were cared for by a hospitalist during your hospital stay. If you have any questions about your discharge medications or the care you received while you were in the hospital after you are discharged, you can call the unit and asked to speak with the hospitalist on call if the hospitalist that took care of you is not available. Once you are discharged, your primary care physician will handle any further medical issues. Please note that NO REFILLS for any discharge medications will be authorized once you are discharged, as it is imperative that you return to your primary care physician (or establish a relationship with a primary care physician if you do not have one) for your aftercare needs so  that they can reassess your need for medications and monitor your lab values.    On the day of Discharge:  VITAL SIGNS:  Blood pressure 140/89, pulse 53, temperature 97.8 F (36.6 C), temperature source Oral, resp. rate 16, height 6' (1.829 m), weight 78.608 kg (173 lb 4.8 oz), SpO2 98 %. PHYSICAL EXAMINATION:  GENERAL:  74 y.o.-year-old patient lying in the bed with no acute distress.  EYES: Pupils equal, round, reactive to light and accommodation. No scleral icterus. Extraocular muscles intact.  HEENT: Head atraumatic, normocephalic. Oropharynx and nasopharynx clear.  NECK:  Supple, no jugular venous distention. No thyroid enlargement, no tenderness.  LUNGS: Normal breath sounds bilaterally, no wheezing, rales,rhonchi or crepitation. No use of accessory muscles of respiration.  CARDIOVASCULAR: S1, S2 normal. No murmurs, rubs, or gallops.  ABDOMEN: Soft, non-tender, non-distended. Bowel sounds present. No organomegaly or mass.  EXTREMITIES: No pedal edema, cyanosis, or clubbing.  NEUROLOGIC: Cranial nerves II through XII are intact. Muscle strength 5/5 in all extremities. Sensation intact. Gait not checked.  PSYCHIATRIC: The patient is alert and oriented x 3.  SKIN: No obvious rash, lesion, or ulcer.  DATA REVIEW:   CBC  Recent  Labs Lab 09/15/15 1733  WBC 7.3  HGB 14.1  HCT 43.4  PLT 167    Chemistries   Recent Labs Lab 09/15/15 1733  NA 133*  K 3.8  CL 103  CO2 23  GLUCOSE 116*  BUN 19  CREATININE 1.38*  CALCIUM 10.2  AST 24  ALT 13*  ALKPHOS 76  BILITOT 0.7    Cardiac Enzymes  Recent Labs Lab 09/15/15 2145  TROPONINI 0.11*    Management plans discussed with the patient and he is in agreement.  CODE STATUS: Full Code  TOTAL TIME TAKING CARE OF THIS PATIENT: 55 minutes.    [facility], Wynette Jersey M.D on 09/18/2015 at 11:34 AM  Between 7am to 6pm - Pager - [phone]  After 6pm go to [email or web address] - password [email or web address] [facility]  [place] [facility]  Office   [phone]  CC: Primary care physician; [facility] Dalia Heading, MD

## 2015-10-31 ENCOUNTER — Encounter: Payer: Self-pay | Admitting: Emergency Medicine

## 2015-10-31 ENCOUNTER — Inpatient Hospital Stay
Admission: EM | Admit: 2015-10-31 | Discharge: 2015-11-03 | DRG: 167 | Disposition: A | Discharge status: Home Health | Payer: BLUE CROSS/BLUE SHIELD | Attending: Internal Medicine | Admitting: Internal Medicine

## 2015-10-31 ENCOUNTER — Inpatient Hospital Stay: Payer: BLUE CROSS/BLUE SHIELD

## 2015-10-31 ENCOUNTER — Emergency Department: Payer: BLUE CROSS/BLUE SHIELD

## 2015-10-31 DIAGNOSIS — E7800 Pure hypercholesterolemia, unspecified: Secondary | ICD-10-CM | POA: Diagnosis present

## 2015-10-31 DIAGNOSIS — I82531 Chronic embolism and thrombosis of right popliteal vein: Secondary | ICD-10-CM | POA: Diagnosis present

## 2015-10-31 DIAGNOSIS — T45515A Adverse effect of anticoagulants, initial encounter: Secondary | ICD-10-CM | POA: Diagnosis present

## 2015-10-31 DIAGNOSIS — Z720 Tobacco use: Secondary | ICD-10-CM

## 2015-10-31 DIAGNOSIS — Z7901 Long term (current) use of anticoagulants: Secondary | ICD-10-CM

## 2015-10-31 DIAGNOSIS — I5022 Chronic systolic (congestive) heart failure: Secondary | ICD-10-CM

## 2015-10-31 DIAGNOSIS — J9621 Acute and chronic respiratory failure with hypoxia: Secondary | ICD-10-CM | POA: Diagnosis not present

## 2015-10-31 DIAGNOSIS — R042 Hemoptysis: Principal | ICD-10-CM | POA: Diagnosis present

## 2015-10-31 DIAGNOSIS — Z91041 Radiographic dye allergy status: Secondary | ICD-10-CM

## 2015-10-31 DIAGNOSIS — Z86711 Personal history of pulmonary embolism: Secondary | ICD-10-CM

## 2015-10-31 DIAGNOSIS — R0902 Hypoxemia: Secondary | ICD-10-CM | POA: Diagnosis present

## 2015-10-31 DIAGNOSIS — R911 Solitary pulmonary nodule: Secondary | ICD-10-CM | POA: Diagnosis present

## 2015-10-31 DIAGNOSIS — I2782 Chronic pulmonary embolism: Secondary | ICD-10-CM | POA: Diagnosis present

## 2015-10-31 DIAGNOSIS — I4891 Unspecified atrial fibrillation: Secondary | ICD-10-CM | POA: Diagnosis present

## 2015-10-31 DIAGNOSIS — Z8711 Personal history of peptic ulcer disease: Secondary | ICD-10-CM | POA: Diagnosis not present

## 2015-10-31 DIAGNOSIS — J45909 Unspecified asthma, uncomplicated: Secondary | ICD-10-CM | POA: Diagnosis present

## 2015-10-31 DIAGNOSIS — J069 Acute upper respiratory infection, unspecified: Secondary | ICD-10-CM | POA: Diagnosis present

## 2015-10-31 DIAGNOSIS — I2699 Other pulmonary embolism without acute cor pulmonale: Secondary | ICD-10-CM

## 2015-10-31 DIAGNOSIS — I82439 Acute embolism and thrombosis of unspecified popliteal vein: Secondary | ICD-10-CM

## 2015-10-31 DIAGNOSIS — R918 Other nonspecific abnormal finding of lung field: Secondary | ICD-10-CM

## 2015-10-31 DIAGNOSIS — Z888 Allergy status to other drugs, medicaments and biological substances status: Secondary | ICD-10-CM

## 2015-10-31 DIAGNOSIS — Z95828 Presence of other vascular implants and grafts: Secondary | ICD-10-CM

## 2015-10-31 DIAGNOSIS — R590 Localized enlarged lymph nodes: Secondary | ICD-10-CM | POA: Diagnosis present

## 2015-10-31 DIAGNOSIS — J449 Chronic obstructive pulmonary disease, unspecified: Secondary | ICD-10-CM | POA: Diagnosis present

## 2015-10-31 DIAGNOSIS — I272 Other secondary pulmonary hypertension: Secondary | ICD-10-CM | POA: Diagnosis present

## 2015-10-31 DIAGNOSIS — E119 Type 2 diabetes mellitus without complications: Secondary | ICD-10-CM | POA: Diagnosis present

## 2015-10-31 DIAGNOSIS — I110 Hypertensive heart disease with heart failure: Secondary | ICD-10-CM | POA: Diagnosis present

## 2015-10-31 DIAGNOSIS — K219 Gastro-esophageal reflux disease without esophagitis: Secondary | ICD-10-CM | POA: Diagnosis present

## 2015-10-31 DIAGNOSIS — I10 Essential (primary) hypertension: Secondary | ICD-10-CM

## 2015-10-31 DIAGNOSIS — F17210 Nicotine dependence, cigarettes, uncomplicated: Secondary | ICD-10-CM | POA: Diagnosis present

## 2015-10-31 DIAGNOSIS — D6832 Hemorrhagic disorder due to extrinsic circulating anticoagulants: Secondary | ICD-10-CM | POA: Diagnosis present

## 2015-10-31 DIAGNOSIS — Y929 Unspecified place or not applicable: Secondary | ICD-10-CM

## 2015-10-31 HISTORY — DX: Heart failure, unspecified: I50.9

## 2015-10-31 LAB — MRSA PCR SCREENING: MRSA by PCR: NEGATIVE

## 2015-10-31 LAB — PROTIME-INR
INR: 1.32
Prothrombin Time: 16.5 seconds — ABNORMAL HIGH (ref 11.4–15.0)

## 2015-10-31 LAB — HEMOGLOBIN: Hemoglobin: 13.1 g/dL (ref 13.0–18.0)

## 2015-10-31 LAB — BASIC METABOLIC PANEL
Anion gap: 5 (ref 5–15)
BUN: 25 mg/dL — AB (ref 6–20)
CALCIUM: 10.2 mg/dL (ref 8.9–10.3)
CO2: 27 mmol/L (ref 22–32)
CREATININE: 1.54 mg/dL — AB (ref 0.61–1.24)
Chloride: 106 mmol/L (ref 101–111)
GFR calc Af Amer: 50 mL/min — ABNORMAL LOW (ref >60)
GFR calc non Af Amer: 43 mL/min — ABNORMAL LOW (ref >60)
GLUCOSE: 108 mg/dL — AB (ref 65–99)
Potassium: 4 mmol/L (ref 3.5–5.1)
Sodium: 138 mmol/L (ref 135–145)

## 2015-10-31 LAB — CBC
HCT: 42.3 % (ref 40.0–52.0)
Hemoglobin: 13.6 g/dL (ref 13.0–18.0)
MCH: 30.1 pg (ref 26.0–34.0)
MCHC: 32.2 g/dL (ref 32.0–36.0)
MCV: 93.5 fL (ref 80.0–100.0)
Platelets: 173 10*3/uL (ref 150–440)
RBC: 4.53 MIL/uL (ref 4.40–5.90)
RDW: 15.6 % — AB (ref 11.5–14.5)
WBC: 5.6 10*3/uL (ref 3.8–10.6)

## 2015-10-31 LAB — GLUCOSE, CAPILLARY
Glucose-Capillary: 68 mg/dL (ref 65–99)
Glucose-Capillary: 74 mg/dL (ref 65–99)
Glucose-Capillary: 70 mg/dL (ref 65–99)

## 2015-10-31 LAB — TROPONIN I: TROPONIN I: 0.1 ng/mL — AB (ref <0.031)

## 2015-10-31 LAB — HEMOGLOBIN A1C: Hgb A1c MFr Bld: 7.3 % — ABNORMAL HIGH (ref 4.0–6.0)

## 2015-10-31 LAB — APTT: aPTT: 34 seconds (ref 24–36)

## 2015-10-31 MED ORDER — DOCUSATE SODIUM 100 MG PO CAPS
100.0000 mg | ORAL_CAPSULE | Freq: Two times a day (BID) | ORAL | Status: DC
  Administered 2015-10-31 – 2015-11-02 (×4): 100 mg via ORAL
  Filled 2015-10-31 (×4): qty 1

## 2015-10-31 MED ORDER — MORPHINE SULFATE (PF) 2 MG/ML IV SOLN: 1.0000 mg | INTRAVENOUS | Status: DC | PRN

## 2015-10-31 MED ORDER — INSULIN ASPART 100 UNIT/ML ~~LOC~~ SOLN
0.0000 [IU] | Freq: Three times a day (TID) | SUBCUTANEOUS | Status: DC
  Administered 2015-11-02: 2 [IU] via SUBCUTANEOUS
  Administered 2015-11-02 – 2015-11-03 (×3): 1 [IU] via SUBCUTANEOUS
  Administered 2015-11-03: 2 [IU] via SUBCUTANEOUS
  Filled 2015-10-31: qty 2
  Filled 2015-10-31 (×2): qty 1
  Filled 2015-10-31: qty 2
  Filled 2015-10-31: qty 1

## 2015-10-31 MED ORDER — CETYLPYRIDINIUM CHLORIDE 0.05 % MT LIQD
7.0000 mL | Freq: Two times a day (BID) | OROMUCOSAL | Status: DC
  Administered 2015-10-31 – 2015-11-03 (×5): 7 mL via OROMUCOSAL

## 2015-10-31 MED ORDER — AZITHROMYCIN 250 MG PO TABS
500.0000 mg | ORAL_TABLET | Freq: Once | ORAL | Status: AC
  Administered 2015-10-31: 500 mg via ORAL
  Filled 2015-10-31: qty 2

## 2015-10-31 MED ORDER — LISINOPRIL 20 MG PO TABS
40.0000 mg | ORAL_TABLET | Freq: Every day | ORAL | Status: DC
  Administered 2015-10-31 – 2015-11-03 (×4): 40 mg via ORAL
  Filled 2015-10-31 (×4): qty 2

## 2015-10-31 MED ORDER — FUROSEMIDE 20 MG PO TABS
20.0000 mg | ORAL_TABLET | Freq: Every day | ORAL | Status: DC
  Administered 2015-11-01 – 2015-11-03 (×3): 20 mg via ORAL
  Filled 2015-10-31 (×3): qty 1

## 2015-10-31 MED ORDER — ACETAMINOPHEN 650 MG RE SUPP: 650.0000 mg | Freq: Four times a day (QID) | RECTAL | Status: DC | PRN

## 2015-10-31 MED ORDER — ACETAMINOPHEN 325 MG PO TABS: 650.0000 mg | ORAL_TABLET | Freq: Four times a day (QID) | ORAL | Status: DC | PRN

## 2015-10-31 MED ORDER — PANTOPRAZOLE SODIUM 40 MG PO TBEC
40.0000 mg | DELAYED_RELEASE_TABLET | Freq: Every day | ORAL | Status: DC
  Administered 2015-10-31 – 2015-11-03 (×4): 40 mg via ORAL
  Filled 2015-10-31 (×4): qty 1

## 2015-10-31 MED ORDER — TIOTROPIUM BROMIDE MONOHYDRATE 18 MCG IN CAPS
18.0000 ug | ORAL_CAPSULE | Freq: Every day | RESPIRATORY_TRACT | Status: DC
  Administered 2015-11-01 – 2015-11-03 (×3): 18 ug via RESPIRATORY_TRACT
  Filled 2015-10-31: qty 5

## 2015-10-31 MED ORDER — SODIUM CHLORIDE 0.9 % IJ SOLN
3.0000 mL | Freq: Two times a day (BID) | INTRAMUSCULAR | Status: DC
  Administered 2015-10-31 – 2015-11-01 (×3): 3 mL via INTRAVENOUS

## 2015-10-31 MED ORDER — SODIUM CHLORIDE 0.9 % IV SOLN
INTRAVENOUS | Status: DC
  Administered 2015-10-31 (×2): via INTRAVENOUS

## 2015-10-31 MED ORDER — ALBUTEROL SULFATE (2.5 MG/3ML) 0.083% IN NEBU
2.5000 mg | INHALATION_SOLUTION | Freq: Four times a day (QID) | RESPIRATORY_TRACT | Status: DC | PRN
  Administered 2015-11-02: 2.5 mg via RESPIRATORY_TRACT
  Filled 2015-10-31: qty 3

## 2015-10-31 MED ORDER — RACEPINEPHRINE HCL 2.25 % IN NEBU
0.5000 mL | INHALATION_SOLUTION | RESPIRATORY_TRACT | Status: DC
  Administered 2015-10-31 (×2): 0.5 mL via RESPIRATORY_TRACT
  Filled 2015-10-31 (×12): qty 0.5

## 2015-10-31 MED ORDER — MOMETASONE FURO-FORMOTEROL FUM 100-5 MCG/ACT IN AERO
2.0000 | INHALATION_SPRAY | Freq: Two times a day (BID) | RESPIRATORY_TRACT | Status: DC
  Administered 2015-10-31 – 2015-11-03 (×6): 2 via RESPIRATORY_TRACT
  Filled 2015-10-31: qty 8.8

## 2015-10-31 MED ORDER — NITROGLYCERIN 0.4 MG SL SUBL: 0.4000 mg | SUBLINGUAL_TABLET | SUBLINGUAL | Status: DC | PRN

## 2015-10-31 MED ORDER — ONDANSETRON HCL 4 MG PO TABS: 4.0000 mg | ORAL_TABLET | Freq: Four times a day (QID) | ORAL | Status: DC | PRN

## 2015-10-31 MED ORDER — FUROSEMIDE 40 MG PO TABS
40.0000 mg | ORAL_TABLET | Freq: Two times a day (BID) | ORAL | Status: DC
  Administered 2015-10-31: 40 mg via ORAL
  Filled 2015-10-31: qty 1

## 2015-10-31 MED ORDER — INSULIN ASPART 100 UNIT/ML ~~LOC~~ SOLN: 0.0000 [IU] | Freq: Every day | SUBCUTANEOUS | Status: DC

## 2015-10-31 MED ORDER — ONDANSETRON HCL 4 MG/2ML IJ SOLN
4.0000 mg | Freq: Four times a day (QID) | INTRAMUSCULAR | Status: DC | PRN
  Administered 2015-11-01: 4 mg via INTRAVENOUS
  Filled 2015-10-31: qty 2

## 2015-10-31 MED ORDER — POLYETHYLENE GLYCOL 3350 17 G PO PACK: 17.0000 g | PACK | Freq: Every day | ORAL | Status: DC | PRN

## 2015-10-31 MED ORDER — PRAVASTATIN SODIUM 40 MG PO TABS
40.0000 mg | ORAL_TABLET | Freq: Every day | ORAL | Status: DC
  Administered 2015-10-31 – 2015-11-02 (×3): 40 mg via ORAL
  Filled 2015-10-31 (×3): qty 1

## 2015-10-31 MED ORDER — IPRATROPIUM-ALBUTEROL 0.5-2.5 (3) MG/3ML IN SOLN
3.0000 mL | Freq: Once | RESPIRATORY_TRACT | Status: AC
  Administered 2015-10-31: 3 mL via RESPIRATORY_TRACT
  Filled 2015-10-31: qty 3

## 2015-10-31 MED ORDER — METOPROLOL TARTRATE 25 MG PO TABS
12.5000 mg | ORAL_TABLET | Freq: Two times a day (BID) | ORAL | Status: DC
  Administered 2015-10-31 – 2015-11-03 (×6): 12.5 mg via ORAL
  Filled 2015-10-31 (×7): qty 1

## 2015-10-31 MED ORDER — DEXTROSE-NACL 5-0.9 % IV SOLN
INTRAVENOUS | Status: DC
  Administered 2015-10-31 – 2015-11-03 (×4): via INTRAVENOUS

## 2015-10-31 NOTE — Progress Notes (Signed)
Patient transferred to the unit from CCU. Received report from Adam. Patient has no complaints of pain. Patient is coughing up bloody sputum. Skin was assessed with Jessica C. No signs of pressure ulcers or wounds. Patient has some discoloration on the lower extremities. Wife was at bedside.   Filed Vitals:   10/31/15 1400 10/31/15 1552  BP: 134/72 151/85  Pulse: 52 58  Temp:  97.8 F (36.6 C)  Resp: 15 16   Patient's O2 sats 96% on 2L via nasal cannula. CBG per glucometer was 74. Will continue to monitor patient. Rudean Haskellonnisha R Jaylend Reiland

## 2015-10-31 NOTE — Progress Notes (Signed)
Pulmonology called but could not be reached.

## 2015-10-31 NOTE — Progress Notes (Signed)
Patient has a history of pulmonary embolism on Eliquis, he presented with acute hemoptysis. Approximately 24 hours ago in onset. This now appears to be significantly improved, the patient is currently on 2 L of oxygen with oxygen saturations greater than 95%, he is breathing comfortably.  -Okay to transfer out of the intensive care unit from respiratory standpoint.  -Hold Eliquis.  -Patient sees Dr. Meredeth IdeFleming outpatient, will change. Pulmonary consult to Dr. Meredeth IdeFleming.  -Dr. Cherene Altesamachandran M.D.

## 2015-10-31 NOTE — Consult Note (Addendum)
[facility]      Assessment and Plan:  Acute hemoptysis.  --This is likely due to recent URTI while on Eliquis.  -We'll start the patient on azithromycin empirically for upper respiratory tract infection. -Given his recent history of venous thrombosis, and given that his current hemoptysis. Does not appear to be life-threatening, I will not attempt to reverse anticoagulation or give prothrombotic PCC concentrate at this time. -I've started the patient on nebulized racemic epinephrine, scheduled. -Is likely that he is experiencing bland diffuse pulmonary hemorrhage from the anticoagulation, will consider bronchoscopy if his bleeding continues after the Eliquis has worn off, which should be in approximately another 24 hours. (last dose was 8pm on 11/19)  Pulmonary embolism. 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 He would prefer to avoid this. However, he will consider it. I discussed the potential risks of both performing the procedure, as well as continuing off anticoagulation, which could result in recurrent pulmonary embolism and death.  Severe bullous emphysema. -As seen on CT chest, continue Advair and Spiriva.  Chronic thromboembolic pulmonary hypertension. -The patient may benefit from chronic therapy for his CTEPH. He will be following up Dr. Meredeth IdeFleming, who is his pulmonologist, and who will resume care of the patient on tomorrow once patient has been transferred to the floor.  Date: 10/31/2015  MRN# [id number] [facility]: Lambert Rhonda Wellars 1941-02-04  Referring Physician:   Benito MccreedyJames Allen Rozman is a 74 y.o. old male seen in consultation for chief complaint of:    Chief Complaint  Patient presents with  . Hemoptysis    HPI:   Loma MessingJames Reber is a 74 y.o. male with a known history of COPD pulmonary  embolism and DVT in the past was on Coumadin but this was discontinued due to a gastric ulcer. Patient was recently in September noted to have significant large pulmonary emboli, both acute and chronic, this began as an evaluation for pulmonary hypertension. He was subsequently discharged on Eliquis.  He has a history of DVT in the past.   He is noted to have URTI symptoms for the past week, approximately 3 days ago he started to cough up blood. This progressed and in the ER he was noted to be coughing up large amounts of bright red blood. The Eliquis was stopped.  Review of CT chest from chest images and report from 08/24/15; Acute pulmonary emboli, there is also mediastinal and hilar lymphadenopathy. There is also severe bullous emphysema.   PMHX:   Past Medical History  Diagnosis Date  . COPD (chronic obstructive pulmonary disease) (HCC)   . Gastric ulcer   . DVT (deep venous thrombosis) (HCC)   . PE (pulmonary embolism)   . Hypertension   . Hypercholesteremia   . Diabetes mellitus without complication (HCC)   . GERD (gastroesophageal reflux disease)   . Atrial fibrillation (HCC)   . Asthma   . CHF (congestive heart failure) (HCC)    Surgical Hx:  Past Surgical History  Procedure Laterality Date  . Hernia repair    . Prostate surgery     Family Hx:  Family History  Problem Relation Age of Onset  . CAD Brother   . Congestive Heart Failure Brother    Social Hx:   Social History  Substance Use Topics  . Smoking status: Current Every Day Smoker -- 0.50  packs/day for 45 years    Types: Cigarettes    Last Attempt to Quit: 07/04/2015  . Smokeless tobacco: None  . Alcohol Use: No   Medication:   No current outpatient prescriptions on file.    Allergies:  Contrast media and Other  Review of Systems: Gen:  Denies  fever, sweats, chills HEENT: Denies blurred vision, double vision. bleeds, sore throat Cvc:  No dizziness, chest pain. Resp:   Denies cough or sputum  porduction, shortness of breath Gi: Denies swallowing difficulty, stomach pain. Gu:  Denies bladder incontinence, burning urine Ext:   No Joint pain, stiffness. Skin: No skin rash,  hives Endoc:  No polyuria, polydipsia. Psych: No depression, insomnia. Other:  All other systems were reviewed with the patient and were negative other that what is mentioned in the HPI.   Physical Examination:   VS: BP 144/95 mmHg  Pulse 62  Temp(Src) 97.9 F (36.6 C) (Oral)  Resp 15  Ht 6' (1.829 m)  Wt 72.576 kg (160 lb)  BMI 21.70 kg/m2  SpO2 100%  General Appearance: No distress  Neuro:without focal findings,  speech normal,  HEENT: PERRLA, EOM intact.   Pulmonary: normal breath sounds, No wheezing.  CardiovascularNormal S1,S2.  No m/r/g.   Abdomen: Benign, Soft, non-tender. Renal:  No costovertebral tenderness  GU:  No performed at this time. Endoc: No evident thyromegaly, no signs of acromegaly. Skin:   warm, no rashes, no ecchymosis  Extremities: normal, no cyanosis, clubbing.  Other findings:    LABORATORY PANEL:   CBC  Recent Labs Lab 10/31/15 0241 10/31/15 0811  WBC 5.6  --   HGB 13.6 13.1  HCT 42.3  --   PLT 173  --    ------------------------------------------------------------------------------------------------------------------  Chemistries   Recent Labs Lab 10/31/15 0241  NA 138  K 4.0  CL 106  CO2 27  GLUCOSE 108*  BUN 25*  CREATININE 1.54*  CALCIUM 10.2   ------------------------------------------------------------------------------------------------------------------  Cardiac Enzymes  Recent Labs Lab 10/30/15 0400  TROPONINI 0.10*   ------------------------------------------------------------  RADIOLOGY:  Dg Chest 2 View  10/31/2015  CLINICAL DATA:  Hemoptysis.  Weakness and cough for 2 weeks. EXAM: CHEST  2 VIEW COMPARISON:  Radiographs and CT September 2016 FINDINGS: Stable cardiomegaly and mediastinal contours. Stable hyperinflation and  emphysema. The question right upper lobe nodule on prior CT is tentatively identified. The right middle lobe nodule on prior CT is not well seen. There is no consolidation to suggest pneumonia. No pulmonary edema, pleural effusion or pneumothorax. Osseous structures are stable. IMPRESSION: 1. Stable cardiomegaly. 2. Stable hyperinflation and emphysema. Right upper lobe nodule on prior CT is tentatively seen. Right middle lobe nodule is not identified. No superimposed acute process. Electronically Signed   By: Rubye Oaks M.D.   On: 10/31/2015 03:03       Thank  you for the consultation and for allowing [facility] to assist in the care of your patient. Our recommendations are noted above.  Please contact us if we can be of further service.   Wells Guiles, MD.  Board Certified in Internal Medicine, Pulmonary Medicine, Critical Care Medicine, and Sleep Medicine.  [facility] Office Number: [phone]  Santiago Glad, M.D.  Stephanie Acre, M.D.  Billy Fischer, M.D

## 2015-10-31 NOTE — ED Provider Notes (Signed)
[facility] Emergency Department Provider Note  ____________________________________________  Time seen: Approximately 3:07 AM  I have reviewed the triage vital signs and the nursing notes.   HISTORY  Chief Complaint Hemoptysis   HPI Austin [name] is a 74 y.o. male who comes into the hospital with hemoptysis. The patient has been coughing up blood since 11am. The patient has been doing a lot of coughing for the last week. He has been coughing up a lot of congestion and mucous. The patient's family reports that there has been blood in his mucous and it has been bright red. The patient feels that it is getting in his throat and choking him. He has been given robitussin for the cough but it has not been helping. The bleeding seemed to ease off a little but has been on and off since 7pm. The patient's family reports that it is like he is vomiting. He is not in any pain at this time. The patient has not had a fever, nausea vomiting or belly pain. The patient has felt SOB. He is also on Eliquis for DVT/PE.   Past Medical History  Diagnosis Date  . COPD (chronic obstructive pulmonary disease) (HCC)   . Gastric ulcer   . DVT (deep venous thrombosis) (HCC)   . PE (pulmonary embolism)   . Hypertension   . Hypercholesteremia   . Diabetes mellitus without complication (HCC)   . GERD (gastroesophageal reflux disease)   . Atrial fibrillation (HCC)   . Asthma   . CHF (congestive heart failure) [facility])     Patient Active Problem List   Diagnosis Date Noted  . Hemoptysis 10/31/2015  . Elevated troponin 09/16/2015  . SOB (shortness of breath) 08/24/2015  . Pulmonary embolism (HCC) 08/24/2015  . 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 Type 2 diabetes mellitus (HCC) 08/16/2015  . HLD (hyperlipidemia) 08/16/2015  . GERD (gastroesophageal reflux disease) 08/16/2015  . Chest pain 08/16/2015  . Constipation 08/16/2015  . HTN  (hypertension) 07/21/2015  . COPD (chronic obstructive pulmonary disease) with chronic bronchitis (HCC) 07/21/2015  . CHF (NYHA class IV, ACC/AHA stage D) (HCC) 07/04/2015  . CHF (congestive heart failure) (HCC) 07/04/2015    Past Surgical History  Procedure Laterality Date  . Hernia repair    . Prostate surgery      Current Outpatient Rx  Name  Route  Sig  Dispense  Refill  . albuterol (PROVENTIL) (2.5 MG/3ML) 0.083% nebulizer solution   Nebulization   Take 3 mLs (2.5 mg total) by nebulization every 6 (six) hours as needed for wheezing or shortness of breath.   75 mL   12   . albuterol-ipratropium (COMBIVENT) 18-103 MCG/ACT inhaler   Inhalation   Inhale 2 puffs into the lungs 4 (four) times daily.         Marland Kitchen apixaban (ELIQUIS) 5 MG TABS tablet   Oral   Take 1 tablet (5 mg total) by mouth 2 (two) times daily.   60 tablet   1   . Fluticasone-Salmeterol (ADVAIR) 100-50 MCG/DOSE AEPB   Inhalation   Inhale 1 puff into the lungs 2 (two) times daily.         . furosemide (LASIX) 40 MG tablet   Oral   Take 1 tablet (40 mg total) by mouth 2 (two) times daily.   60 tablet   5   . glipiZIDE (GLUCOTROL XL) 2.5 MG 24 hr tablet   Oral   Take 2.5  mg by mouth daily with breakfast.         . lisinopril (PRINIVIL,ZESTRIL) 40 MG tablet   Oral   Take 40 mg by mouth daily.         Marland Kitchen. lovastatin (MEVACOR) 40 MG tablet   Oral   Take 40 mg by mouth daily with supper. Pt should take with food.         . metoprolol tartrate (LOPRESSOR) 25 MG tablet   Oral   Take 12.5 mg by mouth 2 (two) times daily.          . nitroGLYCERIN (NITROSTAT) 0.4 MG SL tablet   Sublingual   Place 0.4 mg under the tongue every 5 (five) minutes x 3 doses as needed for chest pain. If no relief call 911 or go to emergency room.         Marland Kitchen. omeprazole (PRILOSEC) 40 MG capsule   Oral   Take 40 mg by mouth daily.         . polyethylene glycol (MIRALAX) packet   Oral   Take 17 g by mouth daily  as needed for moderate constipation.   30 each   0   . tiotropium (SPIRIVA) 18 MCG inhalation capsule   Inhalation   Place 1 capsule (18 mcg total) into inhaler and inhale daily.   30 capsule   0   . nicotine (NICODERM CQ - DOSED IN MG/24 HOURS) 21 mg/24hr patch   Transdermal   Place 1 patch (21 mg total) onto the skin daily. Patient not taking: Reported on 10/31/2015   28 patch   0   . potassium chloride SA (K-DUR,KLOR-CON) 20 MEQ tablet   Oral   Take 1 tablet (20 mEq total) by mouth daily. Patient not taking: Reported on 10/31/2015   30 tablet   0     Allergies Contrast media and Other  Family History  Problem Relation Age of Onset  . CAD Brother   . Congestive Heart Failure Brother     Social History Social History  Substance Use Topics  . Smoking status: Current Every Day Smoker -- 0.50 packs/day for 45 years    Types: Cigarettes    Last Attempt to Quit: 07/04/2015  . Smokeless tobacco: None  . Alcohol Use: No    Review of Systems Constitutional: No fever/chills Eyes: No visual changes. ENT: No sore throat. Cardiovascular: Denies chest pain. Respiratory: hemoptysis and shortness of breath. Gastrointestinal: No abdominal pain.  No nausea, no vomiting.  No diarrhea.  No constipation. Genitourinary: Negative for dysuria. Musculoskeletal: Negative for back pain. Skin: Negative for rash. Neurological: Negative for headaches, focal weakness or numbness.  10-point ROS otherwise negative.  ____________________________________________   PHYSICAL EXAM:  VITAL SIGNS: ED Triage Vitals  Enc Vitals Group     BP 10/31/15 0233 148/100 mmHg     Pulse Rate 10/31/15 0233 65     Resp 10/31/15 0233 22     Temp 10/31/15 0233 97.9 F (36.6 C)     Temp Source 10/31/15 0233 Oral     SpO2 10/31/15 0233 98 %     Weight 10/31/15 0233 160 lb (72.576 kg)     Height 10/31/15 0233 6' (1.829 m)     Head Cir --      Peak Flow --      Pain Score 10/31/15 0241 0      Pain Loc --      Pain Edu? --      Excl. in  GC? --     Constitutional: Alert and oriented. Well appearing and in mild distress. Eyes: Conjunctivae are normal. PERRL. EOMI. Head: Atraumatic. Nose: No congestion/rhinnorhea. Mouth/Throat: Mucous membranes are moist.  Oropharynx non-erythematous. Cardiovascular: Normal rate, regular rhythm. Grossly normal heart sounds.  Good peripheral circulation. Respiratory: Normal respiratory effort.  No retractions. Lungs CTAB. Gastrointestinal: Soft and nontender. No distention. Positive bowel sounds Musculoskeletal: No lower extremity tenderness nor edema.   Neurologic:  Normal speech and language.  Skin:  Skin is warm, dry and intact.  Psychiatric: Mood and affect are normal.   ____________________________________________   LABS (all labs ordered are listed, but only abnormal results are displayed)  Labs Reviewed  BASIC METABOLIC PANEL - Abnormal; Notable for the following:    Glucose, Bld 108 (*)    BUN 25 (*)    Creatinine, Ser 1.54 (*)    GFR calc non Af Amer 43 (*)    GFR calc Af Amer 50 (*)    All other components within normal limits  CBC - Abnormal; Notable for the following:    RDW 15.6 (*)    All other components within normal limits  TROPONIN I - Abnormal; Notable for the following:    Troponin I 0.10 (*)    All other components within normal limits  PROTIME-INR - Abnormal; Notable for the following:    Prothrombin Time 16.5 (*)    All other components within normal limits  APTT   ____________________________________________  EKG  ED ECG REPORT I, Rebecka Apley, the attending physician, personally viewed and interpreted this ECG.   Date: 10/31/2015  EKG Time: 234  Rate: 66  Rhythm: normal sinus rhythm  Axis: right axis deviation  Intervals:none  ST&T Change: flipped t waves II, III, avf, V3,V4, V5 same as 09/2015  ____________________________________________  RADIOLOGY  CXR: stable cardiomegaly, stable  hyperinflation and emphysema, Right upper lobe nodule on prior CT, tentatively, Right middle lobe nodule identified. No superimposed acute process ____________________________________________   PROCEDURES  Procedure(s) performed: None  Critical Care performed: No  ____________________________________________   INITIAL IMPRESSION / ASSESSMENT AND PLAN / ED COURSE  Pertinent labs & imaging results that were available during my care of the patient were reviewed by me and considered in my medical decision making (see chart for details).  This is a 74 year old male who comes in today with hemoptysis. The patient's initial x-rays and blood work is unremarkable but I was called into the patient's room as he had a larger amount of hemoptysis. The patient then became hypoxic with O2 sats in the 80s. I will contact pulmonology to determine the appropriate continued treatment for the patient and with the patient to the hospitalist service. ____________________________________________   FINAL CLINICAL IMPRESSION(S) / ED DIAGNOSES  Final diagnoses:  Hemoptysis  Hypoxia      Rebecka Apley, MD 10/31/15 [phone]

## 2015-10-31 NOTE — ED Notes (Signed)
Patient with cough and congestion times one week. Patient states that yesterday morning around 11:00 am he started coughing up bright red blood. Patient states that he is on a blood thinner.

## 2015-10-31 NOTE — Progress Notes (Signed)
RN notified of CBG of 70. Pt not treated with orange juice due to NPO status. MD notified, orders given to change fluids from NS to D5NS. Will continue to monitor. Syliva Overmanassie A Gervase Colberg, RN

## 2015-10-31 NOTE — H&P (Signed)
Austin [name] is an 74 y.o. male.   Chief Complaint: Coughing up blood HPI: The patient presents emergency department complaining of bloody sputum. The patient states that approximately 12 hours prior to arrival he began coughing up scant amounts of blood. The quantity increased progressively throughout the day. In the emergency department the patient was able to fill one of the emesis bags with blood over the course of an hour or so. He denies chest pain but admits to some shortness of breath. The patient uses oxygen as needed while at home but admits that he rarely does so. In the emergency department oxygen saturations dipped as low as 84% on room air. He increased to 93% on 2 L of oxygen via nasal cannula. 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 Due to ongoing hemoptysis and hypoxia the emergency department staff called for admission.  Past Medical History  Diagnosis Date  . COPD (chronic obstructive pulmonary disease) ([place])   . Gastric ulcer   . DVT (deep venous thrombosis) ([place])   . PE (pulmonary embolism)   . Hypertension   . Hypercholesteremia   . Diabetes mellitus without complication ([place])   . GERD (gastroesophageal reflux disease)   . Atrial fibrillation ([place])   . Asthma   . CHF (congestive heart failure) [facility])     Past Surgical History  Procedure Laterality Date  . Hernia repair    . Prostate surgery      Family History  Problem Relation Age of Onset  . CAD Brother   . Congestive Heart Failure Brother    Social History:  reports that he has been smoking Cigarettes.  He has a 22.5 pack-year smoking history. He does not have any smokeless tobacco history on file. He reports that he does not drink alcohol or use illicit drugs.  Allergies:  Allergies  Allergen Reactions  . Contrast Media [Iodinated Diagnostic Agents] Nausea And Vomiting  . Other Other (See Comments)    Cannot have "high doses of anesthesia because of his lungs"    Prior to  Admission medications   Medication Sig Start Date End Date Taking? Authorizing Provider  albuterol (PROVENTIL) (2.5 MG/3ML) 0.083% nebulizer solution Take 3 mLs (2.5 mg total) by nebulization every 6 (six) hours as needed for wheezing or shortness of breath. 07/08/15  Yes Loletha Grayer, MD  albuterol-ipratropium (COMBIVENT) 18-103 MCG/ACT inhaler Inhale 2 puffs into the lungs 4 (four) times daily.   Yes Historical Provider, MD  apixaban (ELIQUIS) 5 MG TABS tablet Take 1 tablet (5 mg total) by mouth 2 (two) times daily. 09/02/15  Yes Dustin Flock, MD  Fluticasone-Salmeterol (ADVAIR) 100-50 MCG/DOSE AEPB Inhale 1 puff into the lungs 2 (two) times daily.   Yes Historical Provider, MD  furosemide (LASIX) 40 MG tablet Take 1 tablet (40 mg total) by mouth 2 (two) times daily. 07/21/15  Yes Alisa Graff, FNP  glipiZIDE (GLUCOTROL XL) 2.5 MG 24 hr tablet Take 2.5 mg by mouth daily with breakfast.   Yes Historical Provider, MD  lisinopril (PRINIVIL,ZESTRIL) 40 MG tablet Take 40 mg by mouth daily.   Yes Historical Provider, MD  lovastatin (MEVACOR) 40 MG tablet Take 40 mg by mouth daily with supper. Pt should take with food.   Yes Historical Provider, MD  metoprolol tartrate (LOPRESSOR) 25 MG tablet Take 12.5 mg by mouth 2 (two) times daily.    Yes Historical Provider, MD  nitroGLYCERIN (NITROSTAT) 0.4 MG SL tablet Place 0.4 mg under the tongue every  5 (five) minutes x 3 doses as needed for chest pain. If no relief call 911 or go to emergency room.   Yes Historical Provider, MD  omeprazole (PRILOSEC) 40 MG capsule Take 40 mg by mouth daily.   Yes Historical Provider, MD  polyethylene glycol (MIRALAX) packet Take 17 g by mouth daily as needed for moderate constipation. 08/17/15  Yes Vipul Manuella Ghazi, MD  tiotropium (SPIRIVA) 18 MCG inhalation capsule Place 1 capsule (18 mcg total) into inhaler and inhale daily. 07/08/15  Yes Richard Leslye Peer, MD  nicotine (NICODERM CQ - DOSED IN MG/24 HOURS) 21 mg/24hr patch Place 1  patch (21 mg total) onto the skin daily. Patient not taking: Reported on 10/31/2015 07/08/15   Loletha Grayer, MD  potassium chloride SA (K-DUR,KLOR-CON) 20 MEQ tablet Take 1 tablet (20 mEq total) by mouth daily. Patient not taking: Reported on 10/31/2015 07/08/15   Loletha Grayer, MD     Results for orders placed or performed during the hospital encounter of 10/31/15 (from the past 48 hour(s))  Troponin I     Status: Abnormal   Collection Time: 10/30/15  4:00 AM  Result Value Ref Range   Troponin I 0.10 (H) <0.031 ng/mL    Comment: [place] KIM AT 6568 10/31/15.PMH        PERSISTENTLY INCREASED TROPONIN VALUES IN THE RANGE OF 0.04-0.49 ng/mL CAN BE SEEN IN:       -UNSTABLE ANGINA       -CONGESTIVE HEART FAILURE       -MYOCARDITIS       -CHEST TRAUMA       -ARRYHTHMIAS       -LATE PRESENTING MYOCARDIAL INFARCTION       -COPD   CLINICAL FOLLOW-UP RECOMMENDED.   Basic metabolic panel     Status: Abnormal   Collection Time: 10/31/15  2:41 AM  Result Value Ref Range   Sodium 138 135 - 145 mmol/L   Potassium 4.0 3.5 - 5.1 mmol/L   Chloride 106 101 - 111 mmol/L   CO2 27 22 - 32 mmol/L   Glucose, Bld 108 (H) 65 - 99 mg/dL   BUN 25 (H) 6 - 20 mg/dL   Creatinine, Ser 1.54 (H) 0.61 - 1.24 mg/dL   Calcium 10.2 8.9 - 10.3 mg/dL   GFR calc non Af Amer 43 (L) >60 mL/min   GFR calc Af Amer 50 (L) >60 mL/min    Comment: (NOTE) The eGFR has been calculated using the CKD EPI equation. This calculation has not been validated in all clinical situations. eGFR's persistently <60 mL/min signify possible Chronic Kidney Disease.    Anion gap 5 5 - 15  CBC     Status: Abnormal   Collection Time: 10/31/15  2:41 AM  Result Value Ref Range   WBC 5.6 3.8 - 10.6 K/uL   RBC 4.53 4.40 - 5.90 MIL/uL   Hemoglobin 13.6 13.0 - 18.0 g/dL   HCT 42.3 40.0 - 52.0 %   MCV 93.5 80.0 - 100.0 fL   MCH 30.1 26.0 - 34.0 pg   MCHC 32.2 32.0 - 36.0 g/dL   RDW 15.6 (H) 11.5 - 14.5  %   Platelets 173 150 - 440 K/uL  Protime-INR     Status: Abnormal   Collection Time: 10/31/15  2:41 AM  Result Value Ref Range   Prothrombin Time 16.5 (H) 11.4 - 15.0 seconds   INR 1.32   APTT     Status: None  Collection Time: 10/31/15  2:41 AM  Result Value Ref Range   aPTT 34 24 - 36 seconds   Dg Chest 2 View  10/31/2015  CLINICAL DATA:  Hemoptysis.  Weakness and cough for 2 weeks. EXAM: CHEST  2 VIEW COMPARISON:  Radiographs and CT September 2016 FINDINGS: Stable cardiomegaly and mediastinal contours. Stable hyperinflation and emphysema. The question right upper lobe nodule on prior CT is tentatively identified. The right middle lobe nodule on prior CT is not well seen. There is no consolidation to suggest pneumonia. No pulmonary edema, pleural effusion or pneumothorax. Osseous structures are stable. IMPRESSION: 1. Stable cardiomegaly. 2. Stable hyperinflation and emphysema. Right upper lobe nodule on prior CT is tentatively seen. Right middle lobe nodule is not identified. No superimposed acute process. Electronically Signed   By: Jeb Levering M.D.   On: 10/31/2015 03:03    Review of Systems  Constitutional: Negative for fever and chills.  HENT: Negative for sore throat and tinnitus.   Eyes: Negative for blurred vision and redness.  Respiratory: Positive for cough, hemoptysis and shortness of breath.   Cardiovascular: Negative for chest pain, palpitations, orthopnea and PND.  Gastrointestinal: Positive for nausea. Negative for vomiting, abdominal pain and diarrhea.  Genitourinary: Negative for dysuria, urgency and frequency.  Musculoskeletal: Negative for myalgias and joint pain.  Skin: Negative for rash.       No lesions  Neurological: Negative for speech change, focal weakness and weakness.  Endo/Heme/Allergies: Does not bruise/bleed easily.       No temperature intolerance  Psychiatric/Behavioral: Negative for depression and suicidal ideas.    Blood pressure 137/92,  pulse 61, temperature 97.9 F (36.6 C), temperature source Oral, resp. rate 19, height 6' (1.829 m), weight 72.576 kg (160 lb), SpO2 99 %. Physical Exam  Nursing note and vitals reviewed. Constitutional: He is oriented to person, place, and time. He appears well-developed and well-nourished. No distress.  HENT:  Head: Normocephalic and atraumatic.  Mouth/Throat: Oropharynx is clear and moist.  Eyes: Conjunctivae and EOM are normal. Pupils are equal, round, and reactive to light. No scleral icterus.  Neck: Normal range of motion. Neck supple. No JVD present. No tracheal deviation present. No thyromegaly present.  Cardiovascular: Normal rate, regular rhythm and normal heart sounds.  Exam reveals no gallop and no friction rub.   No murmur heard. Respiratory: Breath sounds normal. No respiratory distress. He has no wheezes. He has no rales.  Nasal cannula in place  GI: Soft. Bowel sounds are normal. He exhibits no distension. There is no tenderness.  Genitourinary:  Deferred  Musculoskeletal: Normal range of motion. He exhibits no edema.  Lymphadenopathy:    He has no cervical adenopathy.  Neurological: He is oriented to person, place, and time. No cranial nerve deficit.  Skin: Skin is warm and dry. No rash noted. No erythema.  Psychiatric: He has a normal mood and affect. His behavior is normal. Judgment and thought content normal.     Assessment/Plan This is a 74 year old Afro-American male with COPD and chronic DVT and PE is admitted for hemoptysis and hypoxia. 1. Hemoptysis: The patient denies emesis and routinely expectorates blood thus the source of bleeding is likely pulmonary. I have stopped his Eliquis. I have not ordered FFP at this time as the patient's bleeding has taken place over the course of the day and does not appear to be brisk or worsening. Nonetheless I have admitted him to the stepdown unit in case he requires rapid airway management. Pulmonology has  been consulted for  possible bronchoscopy. 2. COPD: Initially patient was given azithromycin for anti-inflammatory effect as his hemoptysis was thought to be scant and due to COPD exacerbation. At this time the patient does not appear to have sputum production consistent with COPD exacerbation. We'll defer to pulmonology for further guidance regarding acute on chronic respiratory failure. Continue inhalers per home regimen. 3. Hypertension: Controlled; continue lisinopril and metoprolol 4. Diabetes mellitus type 2: Hold oral hypoglycemic agents. Sliding scale insulin while hospitalized 5. CHF: Systolic; chronic. Continue home dose of furosemide. 6. DVT prophylaxis: SCDs 7. GI prophylaxis: Omeprazole per home regimen  The patient is a full code. Time spent on admission orders and critical patient care approximately 45 minutes  Harrie Foreman 10/31/2015, 6:00 AM

## 2015-10-31 NOTE — Progress Notes (Signed)
Per Dr. Laurence Alyam's verbal orders placed order for Racemic Epinephrine nebulizer q4hr

## 2015-10-31 NOTE — ED Notes (Signed)
Pt reports increased coughing up blood.  MD informed and at bedside.  Pt's o2sat drop to 80s and placed on 2L via [place].

## 2015-11-01 ENCOUNTER — Inpatient Hospital Stay: Payer: BLUE CROSS/BLUE SHIELD

## 2015-11-01 ENCOUNTER — Encounter: Payer: Self-pay | Admitting: Radiology

## 2015-11-01 DIAGNOSIS — R918 Other nonspecific abnormal finding of lung field: Secondary | ICD-10-CM

## 2015-11-01 LAB — BASIC METABOLIC PANEL
Anion gap: 6 (ref 5–15)
BUN: 22 mg/dL — AB (ref 6–20)
CHLORIDE: 109 mmol/L (ref 101–111)
CO2: 25 mmol/L (ref 22–32)
CREATININE: 1.29 mg/dL — AB (ref 0.61–1.24)
Calcium: 9.9 mg/dL (ref 8.9–10.3)
GFR, EST NON AFRICAN AMERICAN: 53 mL/min — AB (ref >60)
Glucose, Bld: 78 mg/dL (ref 65–99)
POTASSIUM: 4.3 mmol/L (ref 3.5–5.1)
SODIUM: 140 mmol/L (ref 135–145)

## 2015-11-01 LAB — CBC
HEMATOCRIT: 41.8 % (ref 40.0–52.0)
HEMOGLOBIN: 13.7 g/dL (ref 13.0–18.0)
MCH: 30.5 pg (ref 26.0–34.0)
MCHC: 32.7 g/dL (ref 32.0–36.0)
MCV: 93.4 fL (ref 80.0–100.0)
PLATELETS: 166 10*3/uL (ref 150–440)
RBC: 4.48 MIL/uL (ref 4.40–5.90)
RDW: 15.3 % — ABNORMAL HIGH (ref 11.5–14.5)
WBC: 4.8 10*3/uL (ref 3.8–10.6)

## 2015-11-01 LAB — GLUCOSE, CAPILLARY
Glucose-Capillary: 111 mg/dL — ABNORMAL HIGH (ref 65–99)
Glucose-Capillary: 66 mg/dL (ref 65–99)
Glucose-Capillary: 91 mg/dL (ref 65–99)
Glucose-Capillary: 150 mg/dL — ABNORMAL HIGH (ref 65–99)
Glucose-Capillary: 149 mg/dL — ABNORMAL HIGH (ref 65–99)

## 2015-11-01 MED ORDER — IOHEXOL 300 MG/ML  SOLN: 75.0000 mL | Freq: Once | INTRAMUSCULAR | Status: DC | PRN

## 2015-11-01 MED ORDER — IOHEXOL 350 MG/ML SOLN: 75.0000 mL | Freq: Once | INTRAVENOUS | Status: DC | PRN

## 2015-11-01 MED ORDER — POLYETHYLENE GLYCOL 3350 17 G PO PACK: 17.0000 g | PACK | Freq: Every day | ORAL | Status: DC | PRN

## 2015-11-01 MED ORDER — DEXTROSE 50 % IV SOLN
INTRAVENOUS | Status: AC
  Administered 2015-11-01: 25 mL
  Filled 2015-11-01: qty 50

## 2015-11-01 MED ORDER — DOCUSATE SODIUM 100 MG PO CAPS
200.0000 mg | ORAL_CAPSULE | Freq: Two times a day (BID) | ORAL | Status: DC
  Administered 2015-11-01 – 2015-11-03 (×4): 200 mg via ORAL
  Filled 2015-11-01 (×4): qty 2

## 2015-11-01 MED ORDER — RACEPINEPHRINE HCL 2.25 % IN NEBU
0.5000 mL | INHALATION_SOLUTION | RESPIRATORY_TRACT | Status: DC | PRN
  Filled 2015-11-01: qty 0.5

## 2015-11-01 NOTE — Progress Notes (Signed)
Hypoglycemic Event  CBG: 66  Treatment: D50 IV 25 mL  Symptoms: Hungry  Follow-up CBG: Time:0827 CBG Result:111  Possible Reasons for Event: Inadequate meal intake  Comments/MD notified: no, pt asymptomatic at this time; will address diet orders when MD rounds    Joycelyn ManHimes, Taelyr Jantz M 8:30 AM  11/01/2015

## 2015-11-01 NOTE — Progress Notes (Signed)
Pt is refusing bed alarm at this time. Educated on safety and fall prevention, non-skid socks and calling prior to getting up. Pt verbalizes understanding of this and reports he will call before he gets oob.

## 2015-11-01 NOTE — Progress Notes (Signed)
[facility] [place] at [facility]   PATIENT NAME: Austin MessingJames [name]    MR#:  [id number]  DATE OF BIRTH:  [birth year]  SUBJECTIVE:  CHIEF COMPLAINT:   Chief Complaint  Patient presents with  . Hemoptysis  Still has hemoptysis. No SOB.  REVIEW OF SYSTEMS:    Review of Systems  Constitutional: Positive for malaise/fatigue. Negative for fever and chills.  HENT: Negative for sore throat.   Eyes: Negative for blurred vision, double vision and pain.  Respiratory: Positive for cough, hemoptysis and shortness of breath. Negative for wheezing.   Cardiovascular: Negative for chest pain, palpitations, orthopnea and leg swelling.  Gastrointestinal: Negative for heartburn, nausea, vomiting, abdominal pain, diarrhea and constipation.  Genitourinary: Negative for dysuria and hematuria.  Musculoskeletal: Negative for back pain and joint pain.  Skin: Negative for rash.  Neurological: Negative for sensory change, speech change, focal weakness and headaches.  Endo/Heme/Allergies: Does not bruise/bleed easily.  Psychiatric/Behavioral: Negative for depression. The patient is not nervous/anxious.       DRUG ALLERGIES:   Allergies  Allergen Reactions  . Contrast Media [Iodinated Diagnostic Agents] Nausea And Vomiting  . Other Other (See Comments)    Cannot have "high doses of anesthesia because of his lungs"    VITALS:  Blood pressure 126/67, pulse 56, temperature 97.6 F (36.4 C), temperature source Oral, resp. rate 16, height 6' (1.829 m), weight 72.984 kg (160 lb 14.4 oz), SpO2 96 %.  PHYSICAL EXAMINATION:   Physical Exam  GENERAL:  74 y.o.-year-old patient lying in the bed with no acute distress.  EYES: Pupils equal, round, reactive to light and accommodation. No scleral icterus. Extraocular muscles intact.  HEENT: Head atraumatic, normocephalic. Oropharynx and nasopharynx clear.  NECK:  Supple, no jugular venous distention. No thyroid enlargement, no  tenderness.  LUNGS: Normal breath sounds bilaterally, no wheezing, rales, rhonchi. No use of accessory muscles of respiration.  CARDIOVASCULAR: S1, S2 normal. No murmurs, rubs, or gallops.  ABDOMEN: Soft, nontender, nondistended. Bowel sounds present. No organomegaly or mass.  EXTREMITIES: No cyanosis, clubbing or edema b/l.    NEUROLOGIC: Cranial nerves II through XII are intact. No focal Motor or sensory deficits b/l.   PSYCHIATRIC: The patient is alert and oriented x 3.  SKIN: No obvious rash, lesion, or ulcer.    LABORATORY PANEL:   CBC  Recent Labs Lab 11/01/15 0437  WBC 4.8  HGB 13.7  HCT 41.8  PLT 166   ------------------------------------------------------------------------------------------------------------------  Chemistries   Recent Labs Lab 11/01/15 0437  NA 140  K 4.3  CL 109  CO2 25  GLUCOSE 78  BUN 22*  CREATININE 1.29*  CALCIUM 9.9   ------------------------------------------------------------------------------------------------------------------  Cardiac Enzymes  Recent Labs Lab 10/30/15 0400  TROPONINI 0.10*   ------------------------------------------------------------------------------------------------------------------  RADIOLOGY:  Dg Chest 2 View  10/31/2015  CLINICAL DATA:  Hemoptysis.  Weakness and cough for 2 weeks. EXAM: CHEST  2 VIEW COMPARISON:  Radiographs and CT September 2016 FINDINGS: Stable cardiomegaly and mediastinal contours. Stable hyperinflation and emphysema. The question right upper lobe nodule on prior CT is tentatively identified. The right middle lobe nodule on prior CT is not well seen. There is no consolidation to suggest pneumonia. No pulmonary edema, pleural effusion or pneumothorax. Osseous structures are stable. IMPRESSION: 1. Stable cardiomegaly. 2. Stable hyperinflation and emphysema. Right upper lobe nodule on prior CT is tentatively seen. Right middle lobe nodule is not identified. No superimposed acute  process. Electronically Signed   By: Lujean RaveMelanie  Ehinger M.D.  On: 10/31/2015 03:03   US Venous Img Lower Bilateral  10/31/2015  CLINICAL DATA:  Pulmonary embolus. EXAM: BILATERAL LOWER EXTREMITY VENOUS DOPPLER ULTRASOUND TECHNIQUE: Gray-scale sonography with graded compression, as well as color Doppler and duplex ultrasound were performed to evaluate the lower extremity deep venous systems from the level of the common femoral vein and including the common femoral, femoral, profunda femoral, popliteal and calf veins including the posterior tibial, peroneal and gastrocnemius veins when visible. The superficial great saphenous vein was also interrogated. Spectral Doppler was utilized to evaluate flow at rest and with distal augmentation maneuvers in the common femoral, femoral and popliteal veins. COMPARISON:  None. FINDINGS: RIGHT LOWER EXTREMITY Common Femoral Vein: No evidence of thrombus. Normal compressibility, respiratory phasicity and response to augmentation. Saphenofemoral Junction: No evidence of thrombus. Normal compressibility and flow on color Doppler imaging. Profunda Femoral Vein: No evidence of thrombus. Normal compressibility and flow on color Doppler imaging. Femoral Vein: No evidence of thrombus. Normal compressibility, respiratory phasicity and response to augmentation. Popliteal Vein: Nonocclusive thrombus, unchanged from previous studies. This was present 04/11/2015 and 08/25/2015. Calf Veins: No evidence of thrombus. Normal compressibility and flow on color Doppler imaging. Superficial Great Saphenous Vein: No evidence of thrombus. Normal compressibility and flow on color Doppler imaging. Venous Reflux:  None. Other Findings:  None. LEFT LOWER EXTREMITY Common Femoral Vein: No evidence of thrombus. Normal compressibility, respiratory phasicity and response to augmentation. Saphenofemoral Junction: No evidence of thrombus. Normal compressibility and flow on color Doppler imaging. Profunda  Femoral Vein: No evidence of thrombus. Normal compressibility and flow on color Doppler imaging. Femoral Vein: No evidence of thrombus. Normal compressibility, respiratory phasicity and response to augmentation. Popliteal Vein: No evidence of thrombus. Normal compressibility, respiratory phasicity and response to augmentation. Calf Veins: No evidence of thrombus. Normal compressibility and flow on color Doppler imaging. Superficial Great Saphenous Vein: No evidence of thrombus. Normal compressibility and flow on color Doppler imaging. Venous Reflux:  None. Other Findings:  None. 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 No acute DVT. Electronically Signed   By: Marin Roberts M.D.   On: 10/31/2015 18:39     ASSESSMENT AND PLAN:   This is a 74 year old Afro-American male with COPD and chronic DVT and PE is admitted for hemoptysis and hypoxia. 1. Hemoptysis: Etiology unclear. Does have a RUL nodule on recent CT. Could be due to being on Eliquis for recent PE. CT chest being repeated. Wait for results. Appreciate pulmonary help.  2. COPD Continue inhalers and Nebs PRN  3. Hypertension: Controlled; continue lisinopril and metoprolol  4. Diabetes mellitus type 2:  SSI  5. CHF: Systolic; chronic. Continue home dose of furosemide.   All the records are reviewed and case discussed with Care Management/Social Workerr. Management plans discussed with the patient, family and they are in agreement.  CODE STATUS: FULL  DVT Prophylaxis: SCDs  TOTAL TIME TAKING CARE OF THIS PATIENT: 35 minutes.   POSSIBLE D/C IN 2-3 DAYS, DEPENDING ON CLINICAL CONDITION.   Milagros Loll R M.D on 11/01/2015 at 2:33 PM  Between 7am to 6pm - Pager - [phone]  After 6pm go to [email or web address] - password [email or web address] [facility]  [place] [facility]  Office  [phone]  CC: Primary care physician; [facility]    Note: This dictation  was prepared with Dragon dictation along with smaller phrase technology. Any transcriptional errors that result from this process are unintentional.

## 2015-11-01 NOTE — Progress Notes (Signed)
No distress Still with mild to moderate hemoptysis No new complaints  Filed Vitals:   10/31/15 2000 11/01/15 0516 11/01/15 0856 11/01/15 1128  BP: 134/65 116/56 118/68 126/67  Pulse: 63 60 55 56  Temp: 97.7 F (36.5 C) 98.3 F (36.8 C)  97.6 F (36.4 C)  TempSrc: Oral Oral  Oral  Resp: 18 20  16   Height:      Weight:  72.984 kg (160 lb 14.4 oz)    SpO2: 97% 91%  96%   NAD HEENT: very poor dentition No JVD, LAN noted Mild scattered rhonchi, no wheezes Reg, no M NABS, soft No C/C/E  Prior CXRs and CT chest from 08/2015 reviewed  IMPRESSION: Recent PE Emphysema, COPD Pulmonary nodules on prior CT chest with appearance worrisome for malignancy Acute hemoptysis - acute on chronic bronchitis vs malignancy, exacerbated by apixaban. Doubt recurrent PE as he was fully anticoagulated  PLAN/REC: CT chest with contrast to reassess pulmonary nodules and try to identify site of bleeding. Can also assess status of prior PEs. He had an adverse reaction to contrast media previously but it was not true allergy. Was N/V.  Dr Meredeth IdeFleming has seen pt in office in past. I will notify him and we will sort out who should follow up with him  Billy Fischeravid Simonds, MD PCCM service Mobile [phone] Pager [phone]

## 2015-11-01 NOTE — Progress Notes (Signed)
Date: 11/01/2015,   MRN# [id number] Austin [name] [birth year] Code Status:     Code Status Orders        Start     Ordered   10/31/15 0935  Full code   Continuous     10/31/15 0934     HPI: Continued hemotypsis, mild cough, no syncope, pleurisy, w/u in progress.   PMHX:   Past Medical History  Diagnosis Date  . COPD (chronic obstructive pulmonary disease) (HCC)   . Gastric ulcer   . DVT (deep venous thrombosis) (HCC)   . PE (pulmonary embolism)   . Hypertension   . Hypercholesteremia   . Diabetes mellitus without complication (HCC)   . GERD (gastroesophageal reflux disease)   . Atrial fibrillation (HCC)   . Asthma   . CHF (congestive heart failure) (HCC)    Surgical Hx:  Past Surgical History  Procedure Laterality Date  . Hernia repair    . Prostate surgery     Family Hx:  Family History  Problem Relation Age of Onset  . CAD Brother   . Congestive Heart Failure Brother    Social Hx:   Social History  Substance Use Topics  . Smoking status: Current Every Day Smoker -- 0.50 packs/day for 45 years    Types: Cigarettes    Last Attempt to Quit: 07/04/2015  . Smokeless tobacco: None  . Alcohol Use: No   Medication:    Home Medication:  No current outpatient prescriptions on file.  Current Medication: @   Allergies:  Contrast media and Other  Review of Systems: Gen:  Denies  fever, sweats, chills HEENT: Denies blurred vision, double vision, ear pain, eye pain, hearing loss, nose bleeds, sore throat Cvc:  No dizziness, chest pain or heaviness Resp:  Still bringing up blood, no worse,   Gi: Denies swallowing difficulty, stomach pain, nausea or vomiting, diarrhea, constipation, bowel incontinence Gu:  Denies bladder incontinence, burning urine Ext:   No Joint pain, stiffness or swelling Skin: No skin rash, easy bruising or bleeding or hives Endoc:  No polyuria, polydipsia , polyphagia or weight change Psych: No depression, insomnia or  hallucinations  Other:  All other systems negative  Physical Examination:   VS: BP 126/67 mmHg  Pulse 56  Temp(Src) 97.6 F (36.4 C) (Oral)  Resp 16  Ht 6' (1.829 m)  Wt 160 lb 14.4 oz (72.984 kg)  BMI 21.82 kg/m2  SpO2 96%  General Appearance: No distress  Neuro/psych without focal findings, mental status, speech normal, alert and oriented, cranial nerves 2-12 intact, reflexes normal and symmetric, sensation grossly normal  HEENT: PERRLA, EOM intact, no ptosis, no other lesions noticed Neck; Supple,  jvd, stridor  Pulmonary: rare wheezing, No rales  Mild rhonchi   Cardiovascular:  Normal S1,S2.  No m/r/g.   Abdomen:Benign, Soft, non-tender, No masses, hepatosplenomegaly, No lymphadenopathy Endoc: No evident thyromegaly, no signs of acromegaly or Cushing features Skin:   warm, no rashes, no ecchymosis  Extremities: normal, no cyanosis, clubbing, no edema, warm with normal capillary refill.   Labs results:   Recent Labs     10/31/15  0241  10/31/15  0811  11/01/15  0437  HGB  13.6  13.1  13.7  HCT  42.3   --   41.8  MCV  93.5   --   93.4  WBC  5.6   --   4.8  BUN  25*   --   22*  CREATININE  1.54*   --  1.29*  GLUCOSE  108*   --   78  CALCIUM  10.2   --   9.9  INR  1.32   --    --   ,  Rad results:   Ct Angio Chest Pe W/cm &/or Wo Cm  11/01/2015  CLINICAL DATA:  Hemoptysis EXAM: CT ANGIOGRAPHY CHEST WITH CONTRAST TECHNIQUE: Multidetector CT imaging of the chest was performed using the standard protocol during bolus administration of intravenous contrast. Multiplanar CT image reconstructions and MIPs were obtained to evaluate the vascular anatomy. CONTRAST:  1 OMNIPAQUE IOHEXOL 300 MG/ML  SOLN COMPARISON:  08/24/2015 FINDINGS: There is sequela of prior thromboembolism. There is filling defect along the wall of the right main and lower lobe pulmonary arteries. There has been further remodeling of this thrombus compared to the prior study as the contour ir of the mural  thrombus is smoother. There is a web-like area of filling defect in a right upper lobe pulmonary artery branch on image 77. There is also web-like low density in the pulmonary artery branch to the lingula. There are no new filling defects in the pulmonary arterial tree to suggest new or acute pulmonary thromboembolism. Small mediastinal nodes are stable. No pericardial effusion. There continues to be abnormal dilatation of the right ventricle with respect to the left ventricle. No pneumothorax.  Small left pleural effusion. Stable 16 mm spiculated right upper lobe pulmonary nodule. New spiculated sub solid nodule in the right upper lobe on image 65 measures 8 mm. 11 mm spiculated right middle lobe nodule is stable on image 96. Advanced centrilobular emphysema. 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 Review of the MIP images confirms the above findings. IMPRESSION: No evidence of acute pulmonary thromboembolism. Sequela of prior pulmonary embolism is noted. 16 mm right upper lobe nodule and 11 mm right middle lobe nodule are stable. There is a new sub solid nodule in the right upper lobe on image 65 measuring 8 mm. Again, malignancy is a strong concern. PET-CT may be helpful. 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 Electronically Signed   By: Jolaine ClickArthur  Hoss M.D.   On: 11/01/2015 15:49   [place] Venous Img Lower Bilateral  10/31/2015  CLINICAL DATA:  Pulmonary embolus. EXAM: BILATERAL LOWER EXTREMITY VENOUS DOPPLER ULTRASOUND TECHNIQUE: Gray-scale sonography with graded compression, as well as color Doppler and duplex ultrasound were performed to evaluate the lower extremity deep venous systems from the level of the common femoral vein and including the common femoral, femoral, profunda femoral, popliteal and calf veins including the posterior tibial, peroneal and gastrocnemius veins when visible. The superficial great saphenous vein was also interrogated. Spectral Doppler was  utilized to evaluate flow at rest and with distal augmentation maneuvers in the common femoral, femoral and popliteal veins. COMPARISON:  None. FINDINGS: RIGHT LOWER EXTREMITY Common Femoral Vein: No evidence of thrombus. Normal compressibility, respiratory phasicity and response to augmentation. Saphenofemoral Junction: No evidence of thrombus. Normal compressibility and flow on color Doppler imaging. Profunda Femoral Vein: No evidence of thrombus. Normal compressibility and flow on color Doppler imaging. Femoral Vein: No evidence of thrombus. Normal compressibility, respiratory phasicity and response to augmentation. Popliteal Vein: Nonocclusive thrombus, unchanged from previous studies. This was present 04/11/2015 and 08/25/2015. Calf Veins: No evidence of thrombus. Normal compressibility and flow on color Doppler imaging. Superficial Great Saphenous Vein: No evidence of thrombus. Normal compressibility and flow on color Doppler imaging. Venous Reflux:  None. Other Findings:  None. LEFT  LOWER EXTREMITY Common Femoral Vein: No evidence of thrombus. Normal compressibility, respiratory phasicity and response to augmentation. Saphenofemoral Junction: No evidence of thrombus. Normal compressibility and flow on color Doppler imaging. Profunda Femoral Vein: No evidence of thrombus. Normal compressibility and flow on color Doppler imaging. Femoral Vein: No evidence of thrombus. Normal compressibility, respiratory phasicity and response to augmentation. Popliteal Vein: No evidence of thrombus. Normal compressibility, respiratory phasicity and response to augmentation. Calf Veins: No evidence of thrombus. Normal compressibility and flow on color Doppler imaging. Superficial Great Saphenous Vein: No evidence of thrombus. Normal compressibility and flow on color Doppler imaging. Venous Reflux:  None. Other Findings:  None. 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 No acute DVT.  Electronically Signed   By: Marin Roberts M.D.   On: 10/31/2015 18:39     Assessment and Plan: Non massive hemoptysis, most likely due to preceeding cough and on Apixaban.  No acute pulmonary embplism/dvt. Two nodules from September (16 mm RLL, 11 mm RML nodule) stable but a new 8 mm RUL nodule was also noted  -suppress cough -pred taper -emperic antibiotics -hold apixaban -consider pet scan, ? Tissue dx -family aware this could be a malignant process -advised to stop smoking -will follow -appreciate Dr. Sharol Harness help   I have personally obtained a history, examined the patient, evaluated laboratory and imaging results, formulated the assessment and plan and placed orders.  The Patient requires high complexity decision making for assessment and support, frequent evaluation and titration of therapies, application of advanced monitoring technologies and extensive interpretation of multiple databases.   Marleny Faller,M.D. Pulmonary & Critical care Medicine [facility]

## 2015-11-01 NOTE — Progress Notes (Signed)
Initial Nutrition Assessment  DOCUMENTATION CODES:      INTERVENTION:  Meals and snacks: Cater to pt preferences, menu options discussed with pt and family.  Medical Nutrition Supplement Therapy: If unable to meet nutritional needs will add supplement   NUTRITION DIAGNOSIS:    (none at this time) related to   as evidenced by  .    GOAL:   Patient will meet greater than or equal to 90% of their needs    MONITOR:    (Energy intake, Glucose profile, Pulmonary profile)  REASON FOR ASSESSMENT:   Malnutrition Screening Tool    ASSESSMENT:      Pt admitted with hemoptysis on eliquis for recent PE.  Per MD note, noted RUL nodule on CT  Past Medical History  Diagnosis Date  . COPD (chronic obstructive pulmonary disease) (HCC)   . Gastric ulcer   . DVT (deep venous thrombosis) (HCC)   . PE (pulmonary embolism)   . Hypertension   . Hypercholesteremia   . Diabetes mellitus without complication (HCC)   . GERD (gastroesophageal reflux disease)   . Atrial fibrillation (HCC)   . Asthma   . CHF (congestive heart failure) (HCC)     Current Nutrition: ate 100% of lunch today, meat, 2 veggies and dessert per family  Food/Nutrition-Related History: pt and family report good appetite prior to admission, eating 3 meals per day   Scheduled Medications:  . antiseptic oral rinse  7 mL Mouth Rinse BID  . docusate sodium  100 mg Oral BID  . docusate sodium  200 mg Oral BID  . furosemide  20 mg Oral Daily  . insulin aspart  0-5 Units Subcutaneous QHS  . insulin aspart  0-9 Units Subcutaneous TID WC  . lisinopril  40 mg Oral Daily  . metoprolol tartrate  12.5 mg Oral BID  . mometasone-formoterol  2 puff Inhalation BID  . pantoprazole  40 mg Oral Daily  . pravastatin  40 mg Oral q1800  . sodium chloride  3 mL Intravenous Q12H  . tiotropium  18 mcg Inhalation Daily    Continuous Medications:  . dextrose 5 % and 0.9% NaCl 50 mL/hr at 10/31/15 2140     Electrolyte/Renal  Profile and Glucose Profile:   Recent Labs Lab 10/31/15 0241 11/01/15 0437  NA 138 140  K 4.0 4.3  CL 106 109  CO2 27 25  BUN 25* 22*  CREATININE 1.54* 1.29*  CALCIUM 10.2 9.9  GLUCOSE 108* 78    Gastrointestinal Profile: Last BM:11/19      Weight Change: wt stable prior to admission per pt    Diet Order:  Diet heart healthy/carb modified Room service appropriate?: Yes; Fluid consistency:: Thin  Skin:   reviewed   Height:   Ht Readings from Last 1 Encounters:  10/31/15 6' (1.829 m)    Weight:   Wt Readings from Last 1 Encounters:  11/01/15 160 lb 14.4 oz (72.984 kg)    Ideal Body Weight:     BMI:  Body mass index is 21.82 kg/(m^2).   EDUCATION NEEDS:   No education needs identified at this time  LOW Care Level  Yaritza Leist B. Freida BusmanAllen, RD, LDN [phone])

## 2015-11-02 LAB — GLUCOSE, CAPILLARY
Glucose-Capillary: 150 mg/dL — ABNORMAL HIGH (ref 65–99)
Glucose-Capillary: 145 mg/dL — ABNORMAL HIGH (ref 65–99)
Glucose-Capillary: 154 mg/dL — ABNORMAL HIGH (ref 65–99)
Glucose-Capillary: 129 mg/dL — ABNORMAL HIGH (ref 65–99)

## 2015-11-02 NOTE — Care Management (Signed)
Patient presents from home.  His wife makes most of his decisions for patient.  At last discharge, wife declined having any home health set up.  Patient admitted with hemoptysis.  His Eliquis is on hold which he was taking for pulmonary embolus.  Patient had chest CT which shows some pulmonary nodules

## 2015-11-02 NOTE — Progress Notes (Signed)
[facility] - [place] at [facility]   PATIENT NAME: Austin [name]    MR#:  [id number]  DATE OF BIRTH:  1941-07-26  SUBJECTIVE:  CHIEF COMPLAINT:   Chief Complaint  Patient presents with  . Hemoptysis  Still has hemoptysis. No SOB. Off eliquis. Some wheezing. Constipated  REVIEW OF SYSTEMS:    Review of Systems  Constitutional: Positive for malaise/fatigue. Negative for fever and chills.  HENT: Negative for sore throat.   Eyes: Negative for blurred vision, double vision and pain.  Respiratory: Positive for cough, hemoptysis and shortness of breath. Negative for wheezing.   Cardiovascular: Negative for chest pain, palpitations, orthopnea and leg swelling.  Gastrointestinal: Negative for heartburn, nausea, vomiting, abdominal pain, diarrhea and constipation.  Genitourinary: Negative for dysuria and hematuria.  Musculoskeletal: Negative for back pain and joint pain.  Skin: Negative for rash.  Neurological: Negative for sensory change, speech change, focal weakness and headaches.  Endo/Heme/Allergies: Does not bruise/bleed easily.  Psychiatric/Behavioral: Negative for depression. The patient is not nervous/anxious.       DRUG ALLERGIES:   Allergies  Allergen Reactions  . Contrast Media [Iodinated Diagnostic Agents] Nausea And Vomiting  . Other Other (See Comments)    Cannot have "high doses of anesthesia because of his lungs"    VITALS:  Blood pressure 128/76, pulse 59, temperature 97.3 F (36.3 C), temperature source Axillary, resp. rate 16, height 6' (1.829 m), weight 71.45 kg (157 lb 8.3 oz), SpO2 95 %.  PHYSICAL EXAMINATION:   Physical Exam  GENERAL:  73 y.o.-year-old patient lying in the bed with no acute distress.  EYES: Pupils equal, round, reactive to light and accommodation. No scleral icterus. Extraocular muscles intact.  HEENT: Head atraumatic, normocephalic. Oropharynx and nasopharynx clear.  NECK:  Supple, no jugular venous  distention. No thyroid enlargement, no tenderness.  LUNGS: Normal breath sounds bilaterally, no wheezing, rales, rhonchi. No use of accessory muscles of respiration.  CARDIOVASCULAR: S1, S2 normal. No murmurs, rubs, or gallops.  ABDOMEN: Soft, nontender, nondistended. Bowel sounds present. No organomegaly or mass.  EXTREMITIES: No cyanosis, clubbing or edema b/l.    NEUROLOGIC: Cranial nerves II through XII are intact. No focal Motor or sensory deficits b/l.   PSYCHIATRIC: The patient is alert and oriented x 3.  SKIN: No obvious rash, lesion, or ulcer.    LABORATORY PANEL:   CBC  Recent Labs Lab 11/01/15 0437  WBC 4.8  HGB 13.7  HCT 41.8  PLT 166   ------------------------------------------------------------------------------------------------------------------  Chemistries   Recent Labs Lab 11/01/15 0437  NA 140  K 4.3  CL 109  CO2 25  GLUCOSE 78  BUN 22*  CREATININE 1.29*  CALCIUM 9.9   ------------------------------------------------------------------------------------------------------------------  Cardiac Enzymes  Recent Labs Lab 10/30/15 0400  TROPONINI 0.10*   ------------------------------------------------------------------------------------------------------------------  RADIOLOGY:  Ct Angio Chest Pe W/cm &/or Wo Cm  11/01/2015  CLINICAL DATA:  Hemoptysis EXAM: CT ANGIOGRAPHY CHEST WITH CONTRAST TECHNIQUE: Multidetector CT imaging of the chest was performed using the standard protocol during bolus administration of intravenous contrast. Multiplanar CT image reconstructions and MIPs were obtained to evaluate the vascular anatomy. CONTRAST:  1 OMNIPAQUE IOHEXOL 300 MG/ML  SOLN COMPARISON:  08/24/2015 FINDINGS: There is sequela of prior thromboembolism. There is filling defect along the wall of the right main and lower lobe pulmonary arteries. There has been further remodeling of this thrombus compared to the prior study as the contour ir of the mural  thrombus is smoother. There is a web-like area of  filling defect in a right upper lobe pulmonary artery branch on image 77. There is also web-like low density in the pulmonary artery branch to the lingula. There are no new filling defects in the pulmonary arterial tree to suggest new or acute pulmonary thromboembolism. Small mediastinal nodes are stable. No pericardial effusion. There continues to be abnormal dilatation of the right ventricle with respect to the left ventricle. No pneumothorax.  Small left pleural effusion. Stable 16 mm spiculated right upper lobe pulmonary nodule. New spiculated sub solid nodule in the right upper lobe on image 65 measures 8 mm. 11 mm spiculated right middle lobe nodule is stable on image 96. Advanced centrilobular emphysema. 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 Review of the MIP images confirms the above findings. IMPRESSION: No evidence of acute pulmonary thromboembolism. Sequela of prior pulmonary embolism is noted. 16 mm right upper lobe nodule and 11 mm right middle lobe nodule are stable. There is a new sub solid nodule in the right upper lobe on image 65 measuring 8 mm. Again, malignancy is a strong concern. PET-CT may be helpful. 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 Electronically Signed   By: Jolaine Click M.D.   On: 11/01/2015 15:49   US Venous Img Lower Bilateral  10/31/2015  CLINICAL DATA:  Pulmonary embolus. EXAM: BILATERAL LOWER EXTREMITY VENOUS DOPPLER ULTRASOUND TECHNIQUE: Gray-scale sonography with graded compression, as well as color Doppler and duplex ultrasound were performed to evaluate the lower extremity deep venous systems from the level of the common femoral vein and including the common femoral, femoral, profunda femoral, popliteal and calf veins including the posterior tibial, peroneal and gastrocnemius veins when visible. The superficial great saphenous vein was also interrogated. Spectral Doppler was  utilized to evaluate flow at rest and with distal augmentation maneuvers in the common femoral, femoral and popliteal veins. COMPARISON:  None. FINDINGS: RIGHT LOWER EXTREMITY Common Femoral Vein: No evidence of thrombus. Normal compressibility, respiratory phasicity and response to augmentation. Saphenofemoral Junction: No evidence of thrombus. Normal compressibility and flow on color Doppler imaging. Profunda Femoral Vein: No evidence of thrombus. Normal compressibility and flow on color Doppler imaging. Femoral Vein: No evidence of thrombus. Normal compressibility, respiratory phasicity and response to augmentation. Popliteal Vein: Nonocclusive thrombus, unchanged from previous studies. This was present 04/11/2015 and 08/25/2015. Calf Veins: No evidence of thrombus. Normal compressibility and flow on color Doppler imaging. Superficial Great Saphenous Vein: No evidence of thrombus. Normal compressibility and flow on color Doppler imaging. Venous Reflux:  None. Other Findings:  None. LEFT LOWER EXTREMITY Common Femoral Vein: No evidence of thrombus. Normal compressibility, respiratory phasicity and response to augmentation. Saphenofemoral Junction: No evidence of thrombus. Normal compressibility and flow on color Doppler imaging. Profunda Femoral Vein: No evidence of thrombus. Normal compressibility and flow on color Doppler imaging. Femoral Vein: No evidence of thrombus. Normal compressibility, respiratory phasicity and response to augmentation. Popliteal Vein: No evidence of thrombus. Normal compressibility, respiratory phasicity and response to augmentation. Calf Veins: No evidence of thrombus. Normal compressibility and flow on color Doppler imaging. Superficial Great Saphenous Vein: No evidence of thrombus. Normal compressibility and flow on color Doppler imaging. Venous Reflux:  None. Other Findings:  None. 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 No acute DVT.  Electronically Signed   By: Marin Roberts M.D.   On: 10/31/2015 18:39     ASSESSMENT AND PLAN:   This is a 74 year old Afro-American male with COPD  and chronic DVT and PE is admitted for hemoptysis and hypoxia.  1. Hemoptysis: Etiology unclear. Does have a  nodules on recent CT chest concerning for malignancy. Will need tissue biopsy as OP when better. Could be due to being on Eliquis for recent PE. No PE on repeat CTA chest. Chronic non occlusive popliteal DVT. Appreciate pulmonary help. Still has some hemoptysis. D/C in 1-2 days once improved. Waiting for pulmonary input regarding eliquis continuation after d/c.  2. COPD Continue inhalers and Nebs PRN  3. Hypertension: Controlled; continue lisinopril and metoprolol  4. Diabetes mellitus type 2:  SSI  5. CHF: Systolic; chronic. Continue home dose of furosemide.  All the records are reviewed and case discussed with Care Management/Social Workerr. Management plans discussed with the patient, family and they are in agreement.  CODE STATUS: FULL  DVT Prophylaxis: SCDs  TOTAL TIME TAKING CARE OF THIS PATIENT: 35 minutes.   POSSIBLE D/C IN 2-3 DAYS, DEPENDING ON CLINICAL CONDITION.   Milagros LollSudini, Vaanya Shambaugh R M.D on 11/02/2015 at 12:40 PM  Between 7am to 6pm - Pager - [phone]  After 6pm go to [email or web address] - password [email or web address] [email or web address]  [facility]  Office  [phone]  CC: Primary care physician; [facility]    Note: This dictation was prepared with Dragon dictation along with smaller phrase technology. Any transcriptional errors that result from this process are unintentional.

## 2015-11-02 NOTE — Progress Notes (Signed)
RN offered several times to ambulate patient around the unit. Patient declined both times this was offered. RN will attempt again.

## 2015-11-03 ENCOUNTER — Encounter: Payer: Self-pay | Admitting: Vascular Surgery

## 2015-11-03 ENCOUNTER — Encounter
Admission: EM | Disposition: A | Discharge status: Home Health | Payer: Self-pay | Source: Home / Self Care | Attending: Internal Medicine

## 2015-11-03 DIAGNOSIS — I82439 Acute embolism and thrombosis of unspecified popliteal vein: Secondary | ICD-10-CM

## 2015-11-03 DIAGNOSIS — I5022 Chronic systolic (congestive) heart failure: Secondary | ICD-10-CM

## 2015-11-03 DIAGNOSIS — J449 Chronic obstructive pulmonary disease, unspecified: Secondary | ICD-10-CM

## 2015-11-03 DIAGNOSIS — I2782 Chronic pulmonary embolism: Secondary | ICD-10-CM

## 2015-11-03 DIAGNOSIS — E119 Type 2 diabetes mellitus without complications: Secondary | ICD-10-CM

## 2015-11-03 DIAGNOSIS — Z720 Tobacco use: Secondary | ICD-10-CM

## 2015-11-03 DIAGNOSIS — I10 Essential (primary) hypertension: Secondary | ICD-10-CM

## 2015-11-03 DIAGNOSIS — Z95828 Presence of other vascular implants and grafts: Secondary | ICD-10-CM

## 2015-11-03 DIAGNOSIS — R0902 Hypoxemia: Secondary | ICD-10-CM

## 2015-11-03 HISTORY — PX: PERIPHERAL VASCULAR CATHETERIZATION: SHX172C

## 2015-11-03 LAB — GLUCOSE, CAPILLARY
Glucose-Capillary: 56 mg/dL — ABNORMAL LOW (ref 65–99)
Glucose-Capillary: 144 mg/dL — ABNORMAL HIGH (ref 65–99)
Glucose-Capillary: 169 mg/dL — ABNORMAL HIGH (ref 65–99)
Glucose-Capillary: 104 mg/dL — ABNORMAL HIGH (ref 65–99)

## 2015-11-03 SURGERY — IVC FILTER INSERTION
Anesthesia: Moderate Sedation | Wound class: Clean

## 2015-11-03 MED ORDER — CHLORHEXIDINE GLUCONATE CLOTH 2 % EX PADS
6.0000 | MEDICATED_PAD | Freq: Once | CUTANEOUS | Status: AC
  Administered 2015-11-03: 6 via TOPICAL

## 2015-11-03 MED ORDER — SODIUM CHLORIDE 0.9 % IV SOLN: INTRAVENOUS | Status: DC

## 2015-11-03 MED ORDER — LIDOCAINE-EPINEPHRINE (PF) 1 %-1:200000 IJ SOLN
INTRAMUSCULAR | Status: AC
  Filled 2015-11-03: qty 30

## 2015-11-03 MED ORDER — BISACODYL 10 MG RE SUPP
10.0000 mg | Freq: Once | RECTAL | Status: DC
Start: 2015-11-03 — End: 2015-11-03

## 2015-11-03 MED ORDER — MIDAZOLAM HCL 2 MG/2ML IJ SOLN
INTRAMUSCULAR | Status: DC | PRN
  Administered 2015-11-03: 2 mg via INTRAVENOUS

## 2015-11-03 MED ORDER — MIDAZOLAM HCL 2 MG/2ML IJ SOLN
INTRAMUSCULAR | Status: AC
  Filled 2015-11-03: qty 2

## 2015-11-03 MED ORDER — FENTANYL CITRATE (PF) 100 MCG/2ML IJ SOLN
INTRAMUSCULAR | Status: DC | PRN
  Administered 2015-11-03: 100 ug via INTRAVENOUS

## 2015-11-03 MED ORDER — FENTANYL CITRATE (PF) 100 MCG/2ML IJ SOLN
INTRAMUSCULAR | Status: AC
  Filled 2015-11-03: qty 2

## 2015-11-03 MED ORDER — FAMOTIDINE 20 MG PO TABS
ORAL_TABLET | ORAL | Status: AC
  Administered 2015-11-03: 20 mg
  Filled 2015-11-03: qty 1

## 2015-11-03 MED ORDER — IOHEXOL 300 MG/ML  SOLN
INTRAMUSCULAR | Status: DC | PRN
  Administered 2015-11-03: 15 mL via INTRAVENOUS

## 2015-11-03 MED ORDER — CEFAZOLIN SODIUM 1-5 GM-% IV SOLN
1.0000 g | Freq: Once | INTRAVENOUS | Status: DC
  Filled 2015-11-03: qty 50

## 2015-11-03 MED ORDER — LIDOCAINE-EPINEPHRINE (PF) 1 %-1:200000 IJ SOLN
INTRAMUSCULAR | Status: DC | PRN
  Administered 2015-11-03: 10 mL via INTRADERMAL

## 2015-11-03 MED ORDER — METHYLPREDNISOLONE SODIUM SUCC 125 MG IJ SOLR
INTRAMUSCULAR | Status: AC
  Administered 2015-11-03: 125 mg
  Filled 2015-11-03: qty 2

## 2015-11-03 MED ORDER — HEPARIN SODIUM (PORCINE) 10000 UNIT/ML IJ SOLN
INTRAMUSCULAR | Status: AC
  Filled 2015-11-03: qty 1

## 2015-11-03 MED ORDER — CEFAZOLIN SODIUM 1-5 GM-% IV SOLN
1.0000 g | Freq: Once | INTRAVENOUS | Status: AC
  Administered 2015-11-03: 1 g via INTRAVENOUS
  Filled 2015-11-03: qty 50

## 2015-11-03 SURGICAL SUPPLY — 4 items
FILTER VC CELECT-FEMORAL (Filter) ×3 IMPLANT
PACK ANGIOGRAPHY (CUSTOM PROCEDURE TRAY) ×3 IMPLANT
TOWEL OR 17X26 4PK STRL BLUE (TOWEL DISPOSABLE) ×3 IMPLANT
WIRE J 3MM .035X145CM (WIRE) ×3 IMPLANT

## 2015-11-03 NOTE — Discharge Summary (Signed)
[facility]   PATIENT NAME: Austin [name]    MR#:  [id number]  DATE OF BIRTH:  [birth year]  DATE OF ADMISSION:  10/31/2015 ADMITTING PHYSICIAN: Arnaldo Natal, MD  DATE OF DISCHARGE: 11/03/2015  5:11 PM  PRIMARY CARE PHYSICIAN: [facility]     ADMISSION DIAGNOSIS:  Hypoxia [R09.02] Hemoptysis [R04.2]  DISCHARGE DIAGNOSIS:  Principal Problem:   Hemoptysis Active Problems:   Hypoxia   COPD (chronic obstructive pulmonary disease) (HCC)   DVT of popliteal vein (HCC)   Continuous tobacco abuse   Chronic pulmonary embolism (HCC)   S/P insertion of IVC (inferior vena caval) filter   Chronic systolic CHF (congestive heart failure) (HCC)   Essential hypertension   Diabetes mellitus (HCC)   SECONDARY DIAGNOSIS:   Past Medical History  Diagnosis Date  . COPD (chronic obstructive pulmonary disease) (HCC)   . Gastric ulcer   . DVT (deep venous thrombosis) (HCC)   . PE (pulmonary embolism)   . Hypertension   . Hypercholesteremia   . Diabetes mellitus without complication (HCC)   . GERD (gastroesophageal reflux disease)   . Atrial fibrillation (HCC)   . Asthma   . CHF (congestive heart failure) (HCC)     .pro HOSPITAL COURSE:   Patient is 74 year old African-American male with past medical history significant for history of COPD, DVT, pulmonary embolism who presents to the hospital with complaints of hemoptysis. Chest x-ray revealed stable hyperinflation and emphysema in the right upper lobe nodule. Doppler ultrasound revealed right popliteal vein. Chronic nonocclusive thrombus. CT angiogram of chest showed no acute pulmonary embolism. Bifascicular of prior pulmonary embolism and 16 mm right upper lobe nodule as well as 11 mm right middle lobe nodule. There was new solid nodule in the right upper lobe measuring 8 mm, concerning for malignancy. PET/CT scan was recommended. Patient was seen by  pulmonologist, Dr. Meredeth Ide, who recommended to stop Eliquis. Stopping anticoagulation,  Patient's hemoptysis subsided . Patient was seen by vascular surgeon and recommended IVC filter placement due to nonocclusive DVT. This was performed on 11/03/2015. Post procedure, patient feels satisfactory. However, admitted to feeling somewhat weak. He was recommended home health physical therapy as well as RN, which were prescribed for him upon discharge Discussion by problem #1, hemoptysis, likely due to anticoagulation with Eliquis. CT is concerning for malignancy . Pulmonologist, Dr. Meredeth Ide recommends outpatient PET/CT scan #2. Chronic popliteal DVT, seen by the vascular surgery , status post IVC filter placement 23rd of November 2016   #3. COPD patient is to continue his inhalers as well as nebulizers #4. Essential hypertension, well-controlled on lisinopril as well as metoprolol to be continued as outpatient, #5  DM, type 2,  continue outpatient medications #6 chronic systolic CHF, continue outpatient medications. No changes #7. Generalized weakness, home health physical therapy as well as RN   DISCHARGE CONDITIONS:  Stable   DRUG ALLERGIES:   Allergies  Allergen Reactions  . Contrast Media [Iodinated Diagnostic Agents] Nausea And Vomiting  . Other Other (See Comments)    Cannot have "high doses of anesthesia because of his lungs"    DISCHARGE MEDICATIONS:   Discharge Medication List as of 11/03/2015  4:27 PM    CONTINUE these medications which have NOT CHANGED   Details  albuterol (PROVENTIL) (2.5 MG/3ML) 0.083% nebulizer solution Take 3 mLs (2.5 mg total) by nebulization every 6 (six) hours as needed for wheezing or shortness of breath., Starting 07/08/2015, Until Discontinued, Normal  albuterol-ipratropium (COMBIVENT) 18-103 MCG/ACT inhaler Inhale 2 puffs into the lungs 4 (four) times daily., Until Discontinued, Historical Med    Fluticasone-Salmeterol (ADVAIR) 100-50 MCG/DOSE AEPB  Inhale 1 puff into the lungs 2 (two) times daily., Until Discontinued, Historical Med    furosemide (LASIX) 40 MG tablet Take 1 tablet (40 mg total) by mouth 2 (two) times daily., Starting 07/21/2015, Until Discontinued, Normal    glipiZIDE (GLUCOTROL XL) 2.5 MG 24 hr tablet Take 2.5 mg by mouth daily with breakfast., Until Discontinued, Historical Med    lisinopril (PRINIVIL,ZESTRIL) 40 MG tablet Take 40 mg by mouth daily., Until Discontinued, Historical Med    lovastatin (MEVACOR) 40 MG tablet Take 40 mg by mouth daily with supper. Pt should take with food., Until Discontinued, Historical Med    metoprolol tartrate (LOPRESSOR) 25 MG tablet Take 12.5 mg by mouth 2 (two) times daily. , Until Discontinued, Historical Med    nitroGLYCERIN (NITROSTAT) 0.4 MG SL tablet Place 0.4 mg under the tongue every 5 (five) minutes x 3 doses as needed for chest pain. If no relief call 911 or go to emergency room., Until Discontinued, Historical Med    omeprazole (PRILOSEC) 40 MG capsule Take 40 mg by mouth daily., Until Discontinued, Historical Med    polyethylene glycol (MIRALAX) packet Take 17 g by mouth daily as needed for moderate constipation., Starting 08/17/2015, Until Discontinued, Normal    tiotropium (SPIRIVA) 18 MCG inhalation capsule Place 1 capsule (18 mcg total) into inhaler and inhale daily., Starting 07/08/2015, Until Discontinued, Normal    nicotine (NICODERM CQ - DOSED IN MG/24 HOURS) 21 mg/24hr patch Place 1 patch (21 mg total) onto the skin daily., Starting 07/08/2015, Until Discontinued, Normal      STOP taking these medications     apixaban (ELIQUIS) 5 MG TABS tablet      potassium chloride SA (K-DUR,KLOR-CON) 20 MEQ tablet          DISCHARGE INSTRUCTIONS:    patient is to follow-up with primary care physician and pulmonologist as outpatient  If you experience worsening of your admission symptoms, develop shortness of breath, life threatening emergency, suicidal or homicidal  thoughts you must seek medical attention immediately by calling 911 or calling your MD immediately  if symptoms less severe.  You Must read complete instructions/literature along with all the possible adverse reactions/side effects for all the Medicines you take and that have been prescribed to you. Take any new Medicines after you have completely understood and accept all the possible adverse reactions/side effects.   Please note  You were cared for by a hospitalist during your hospital stay. If you have any questions about your discharge medications or the care you received while you were in the hospital after you are discharged, you can call the unit and asked to speak with the hospitalist on call if the hospitalist that took care of you is not available. Once you are discharged, your primary care physician will handle any further medical issues. Please note that NO REFILLS for any discharge medications will be authorized once you are discharged, as it is imperative that you return to your primary care physician (or establish a relationship with a primary care physician if you do not have one) for your aftercare needs so that they can reassess your need for medications and monitor your lab values.    Today   CHIEF COMPLAINT:   Chief Complaint  Patient presents with  . Hemoptysis    HISTORY OF PRESENT ILLNESS:  Austin [name]  is a 74 y.o. male with a known history of  COPD, DVT, pulmonary embolism who presents to the hospital with complaints of hemoptysis. Chest x-ray revealed stable hyperinflation and emphysema in the right upper lobe nodule. Doppler ultrasound revealed right popliteal vein. Chronic nonocclusive thrombus. CT angiogram of chest showed no acute pulmonary embolism. Bifascicular of prior pulmonary embolism and 16 mm right upper lobe nodule as well as 11 mm right middle lobe nodule. There was new solid nodule in the right upper lobe measuring 8 mm, concerning for malignancy. PET/CT  scan was recommended. Patient was seen by pulmonologist, Dr. Meredeth IdeFleming, who recommended to stop Eliquis. Stopping anticoagulation,  Patient's hemoptysis subsided . Patient was seen by vascular surgeon and recommended IVC filter placement due to nonocclusive DVT. This was performed on 11/03/2015. Post procedure, patient feels satisfactory. However, admitted to feeling somewhat weak. He was recommended home health physical therapy as well as RN, which were prescribed for him upon discharge Discussion by problem #1, hemoptysis, likely due to anticoagulation with Eliquis. CT is concerning for malignancy . Pulmonologist, Dr. Meredeth IdeFleming recommends outpatient PET/CT scan #2. Chronic popliteal DVT, seen by the vascular surgery , status post IVC filter placement 23rd of November 2016   #3. COPD patient is to continue his inhalers as well as nebulizers #4. Essential hypertension, well-controlled on lisinopril as well as metoprolol to be continued as outpatient, #5  DM, type 2,  continue outpatient medications #6 chronic systolic CHF, continue outpatient medications. No changes #7. Generalized weakness, home health physical therapy as well as RN     VITAL SIGNS:  Blood pressure 132/79, pulse 58, temperature 97.6 F (36.4 C), temperature source Oral, resp. rate 18, height 6' (1.829 m), weight 78.019 kg (172 lb), SpO2 97 %.  I/O:   Intake/Output Summary (Last 24 hours) at 11/03/15 1726 Last data filed at 11/03/15 1645  Gross per 24 hour  Intake 1679.17 ml  Output   1750 ml  Net -70.83 ml    PHYSICAL EXAMINATION:  GENERAL:  74 y.o.-year-old patient lying in the bed with no acute distress.  EYES: Pupils equal, round, reactive to light and accommodation. No scleral icterus. Extraocular muscles intact.  HEENT: Head atraumatic, normocephalic. Oropharynx and nasopharynx clear.  NECK:  Supple, no jugular venous distention. No thyroid enlargement, no tenderness.  LUNGS: Normal breath sounds bilaterally, no  wheezing, rales,rhonchi or crepitation. No use of accessory muscles of respiration.  CARDIOVASCULAR: S1, S2 normal. No murmurs, rubs, or gallops.  ABDOMEN: Soft, non-tender, non-distended. Bowel sounds present. No organomegaly or mass.  EXTREMITIES: No pedal edema, cyanosis, or clubbing.  NEUROLOGIC: Cranial nerves II through XII are intact. Muscle strength 5/5 in all extremities. Sensation intact. Gait not checked.  PSYCHIATRIC: The patient is alert and oriented x 3.  SKIN: No obvious rash, lesion, or ulcer.   DATA REVIEW:   CBC  Recent Labs Lab 11/01/15 0437  WBC 4.8  HGB 13.7  HCT 41.8  PLT 166    Chemistries   Recent Labs Lab 11/01/15 0437  NA 140  K 4.3  CL 109  CO2 25  GLUCOSE 78  BUN 22*  CREATININE 1.29*  CALCIUM 9.9    Cardiac Enzymes  Recent Labs Lab 10/30/15 0400  TROPONINI 0.10*    Microbiology Results  Results for orders placed or performed during the hospital encounter of 10/31/15  MRSA PCR Screening     Status: None   Collection Time: 10/31/15  9:25 AM  Result Value Ref Range  Status   MRSA by PCR NEGATIVE NEGATIVE Final    Comment:        The GeneXpert MRSA Assay (FDA approved for NASAL specimens only), is one component of a comprehensive MRSA colonization surveillance program. It is not intended to diagnose MRSA infection nor to guide or monitor treatment for MRSA infections.     RADIOLOGY:  No results found.  EKG:   Orders placed or performed during the hospital encounter of 10/31/15  . EKG 12-Lead  . EKG 12-Lead  . ED EKG  . ED EKG  . EKG 12-Lead  . EKG 12-Lead      Management plans discussed with the patient, family and they are in agreement.  CODE STATUS:     Code Status Orders        Start     Ordered   10/31/15 0935  Full code   Continuous     10/31/15 0934      TOTAL TIME TAKING CARE OF THIS PATIE40 minutes.    Katharina Caper M.D on 11/03/2015 at 5:26 PM  Between 7am to 6pm - Pager -  [phone]  After 6pm go to [email or web address] - password [email or web address] [facility]  [place]  Office  [phone]  CC: Primary care physician; [facility]

## 2015-11-03 NOTE — Progress Notes (Addendum)
Pt up time at 1610, slightly past up time of 1545. RN ambulated with patient 1x around RN station with gait belt and wife. Pt tolerated well, though does report he feels slightly weaker than normal. No report of pain, SOB, or dizziness. CM asked about poss. HH/PT set up.   MD updated on pt status and MD reports she will put in HH/PT orders. Per MD, tell pt to not take lasix tonight.

## 2015-11-03 NOTE — Discharge Instructions (Signed)
[facility] [phone].  AGENCY IS CHECKING WITH INSURANCE COMPANY FOR APPROVAL AND WILL CALL MRS Talamante

## 2015-11-03 NOTE — Op Note (Signed)
[facility]   OPERATIVE NOTE    PRE-OPERATIVE DIAGNOSIS: PE and residual LE DVT with need to stop Eliquis due to hemoptysis and possible lung mass  POST-OPERATIVE DIAGNOSIS: same as above  PROCEDURE: 1.   Ultrasound guidance for vascular access to the right femoral vein 2.   Catheter placement into the inferior vena cava 3.   Inferior venacavogram 4.   Placement of a Cook Celect IVC filter  SURGEON: Festus BarrenJason Takyia Sindt, MD  ASSISTANT(S): None  ANESTHESIA: local/sedation  ESTIMATED BLOOD LOSS: minimal  FINDING(S): 1.  Patent IVC  SPECIMEN(S):  none  INDICATIONS:   Austin MccreedyJames Allen [name] is a 74 y.o. male who presents with hemoptysis and a possible lung mass.  He was on Eliquis for a PE two months ago, and this now needs to be stopped.  Inferior vena cava filter is indicated for this reason.  Risks and benefits including filter thrombosis, migration, fracture, bleeding, and infection were all discussed.  We discussed that all IVC filters that we place can be removed if desired from the patient once the need for the filter has passed.    DESCRIPTION: After obtaining full informed written consent, the patient was brought back to the vascular suite. The skin was sterilely prepped and draped in a sterile surgical field was created. The right femoral vein was accessed under direct ultrasound guidance without difficulty with a Seldinger needle and a J-wire was then placed. After skin nick and dilatation, the delivery sheath was placed into the inferior vena cava and an inferior venacavogram was performed. This demonstrated a patent IVC with the level of the renal veins at the top of L2.  The filter was then deployed into the inferior vena cava at the level of the bottom of L2 just below the renal veins. The delivery sheath was then removed. Pressure was held. Sterile dressings were placed. The patient tolerated the procedure well and was taken to the recovery room in stable  condition.  COMPLICATIONS: None  CONDITION: Stable  [name]  11/03/2015, 2:34 PM

## 2015-11-03 NOTE — Progress Notes (Signed)
Date: 11/03/2015,   MRN# [id number] Austin BasemanJames Allen [name] 10-Jan-1941 Code Status:     Code Status Orders        Start     Ordered   10/31/15 0935  Full code   Continuous     10/31/15 0934     Hosp day:@LENGTHOFSTAYDAYS @ .  HPI: Hemoptysis is much improved, for ivc filter, home today, out pt f/u visit with me planned.  PMHX:   Past Medical History  Diagnosis Date  . COPD (chronic obstructive pulmonary disease) (HCC)   . Gastric ulcer   . DVT (deep venous thrombosis) (HCC)   . PE (pulmonary embolism)   . Hypertension   . Hypercholesteremia   . Diabetes mellitus without complication (HCC)   . GERD (gastroesophageal reflux disease)   . Atrial fibrillation (HCC)   . Asthma   . CHF (congestive heart failure) (HCC)    Surgical Hx:  Past Surgical History  Procedure Laterality Date  . Hernia repair    . Prostate surgery     Family Hx:  Family History  Problem Relation Age of Onset  . CAD Brother   . Congestive Heart Failure Brother    Social Hx:   Social History  Substance Use Topics  . Smoking status: Current Every Day Smoker -- 0.50 packs/day for 45 years    Types: Cigarettes    Last Attempt to Quit: 07/04/2015  . Smokeless tobacco: None  . Alcohol Use: No   Medication:    Home Medication:  No current outpatient prescriptions on file.  Current Medication: @CURMEDTAB @   Allergies:  Contrast media and Other  Review of Systems: Gen:  Denies  fever, sweats, chills HEENT: Denies blurred vision, double vision, ear pain, eye pain, hearing loss, nose bleeds, sore throat Cvc:  No dizziness, chest pain or heaviness Resp:    Gi: Denies swallowing difficulty, stomach pain, nausea or vomiting, diarrhea, constipation, bowel incontinence Gu:  Denies bladder incontinence, burning urine Ext:   No Joint pain, stiffness or swelling Skin: No skin rash, easy bruising or bleeding or hives Endoc:  No polyuria, polydipsia , polyphagia or weight change Psych: No depression,  insomnia or hallucinations  Other:  All other systems negative  Physical Examination:   VS: BP 146/89 mmHg  Pulse 52  Temp(Src) 99.6 F (37.6 C) (Oral)  Resp 19  Ht 6' (1.829 m)  Wt 172 lb (78.019 kg)  BMI 23.32 kg/m2  SpO2 97%  General Appearance: No distress  Neuro: without focal findings, mental status, speech normal, alert and oriented, cranial nerves 2-12 intact, reflexes normal and symmetric, sensation grossly normal  HEENT: PERRLA, EOM intact, no ptosis, no other lesions noticed, Mallampati: Pulmonary:.No wheezing, No rales  Sputum Production:   Cardiovascular:  Normal S1,S2.  No m/r/g.  Abdominal aorta pulsation normal.    Abdomen:Benign, Soft, non-tender, No masses, hepatosplenomegaly, No lymphadenopathy Endoc: No evident thyromegaly, no signs of acromegaly or Cushing features Skin:   warm, no rashes, no ecchymosis  Extremities: normal, no cyanosis, clubbing, no edema, warm with normal capillary refill. Other findings:   Labs results:   Recent Labs     11/01/15  0437  HGB  13.7  HCT  41.8  MCV  93.4  WBC  4.8  BUN  22*  CREATININE  1.29*  GLUCOSE  78  CALCIUM  9.9  ,    RAD results:  10/31/2015 CLINICAL DATA: Pulmonary embolus. EXAM: BILATERAL LOWER EXTREMITY VENOUS DOPPLER ULTRASOUND TECHNIQUE: Gray-scale sonography with graded compression, as well  as color Doppler and duplex ultrasound were performed to evaluate the lower extremity deep venous systems from the level of the common femoral vein and including the common femoral, femoral, profunda femoral, popliteal and calf veins including the posterior tibial, peroneal and gastrocnemius veins when visible. The superficial great saphenous vein was also interrogated. Spectral Doppler was utilized to evaluate flow at rest and with distal augmentation maneuvers in the common femoral, femoral and popliteal veins. COMPARISON: None. FINDINGS: RIGHT LOWER EXTREMITY Common Femoral Vein: No evidence of thrombus. Normal  compressibility, respiratory phasicity and response to augmentation. Saphenofemoral Junction: No evidence of thrombus. Normal compressibility and flow on color Doppler imaging. Profunda Femoral Vein: No evidence of thrombus. Normal compressibility and flow on color Doppler imaging. Femoral Vein: No evidence of thrombus. Normal compressibility, respiratory phasicity and response to augmentation. Popliteal Vein: Nonocclusive thrombus, unchanged from previous studies. This was present 04/11/2015 and 08/25/2015. Calf Veins: No evidence of thrombus. Normal compressibility and flow on color Doppler imaging. Superficial Great Saphenous Vein: No evidence of thrombus. Normal compressibility and flow on color Doppler imaging. Venous Reflux: None. Other Findings: None. LEFT LOWER EXTREMITY Common Femoral Vein: No evidence of thrombus. Normal compressibility, respiratory phasicity and response to augmentation. Saphenofemoral Junction: No evidence of thrombus. Normal compressibility and flow on color Doppler imaging. Profunda Femoral Vein: No evidence of thrombus. Normal compressibility and flow on color Doppler imaging. Femoral Vein: No evidence of thrombus. Normal compressibility, respiratory phasicity and response to augmentation. Popliteal Vein: No evidence of thrombus. Normal compressibility, respiratory phasicity and response to augmentation. Calf Veins: No evidence of thrombus. Normal compressibility and flow on color Doppler imaging. Superficial Great Saphenous Vein: No evidence of thrombus. Normal compressibility and flow on color Doppler imaging. Venous Reflux: None. Other Findings: None. 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 No acute DVT. Electronically Signed By: Marin Roberts M.D. On: 10/31/2015 18:39    Assessment and Plan: Non massive hemoptysis, most likely due to preceeding cough and on Apixaban. No acute pulmonary embplism/dvt on repeat studies. Two  nodules from September (16 mm RLL, 11 mm RML nodule) stable but a new 8 mm RUL nodule was also noted  -suppress cough -pred taper -emperic antibiotics -stop apixaban -consider out patient  pet scan, ? Tissue dx -family aware this could be a malignant process -advised to stop smoking -IVC filter to be placed -will follow   I have personally obtained a history, examined the patient, evaluated laboratory and imaging results, formulated the assessment and plan and placed orders.  The Patient requires high complexity decision making for assessment and support, frequent evaluation and titration of therapies, application of advanced monitoring technologies and extensive interpretation of multiple databases.   Mario Coronado,M.D. Pulmonary & Critical care Medicine [facility]

## 2015-11-03 NOTE — Progress Notes (Signed)
Pt. Discharged to home w HHPT via wc. Discharge instructions and medication regimen reviewed at bedside with patient, wife and family. Family verbalizes understanding of instructions and medication regimen.  Patient assessment unchanged from this morning. TELE and IV discontinued per policy.

## 2015-11-03 NOTE — Progress Notes (Signed)
RN offered to ambulate with patient this AM, patient had suggested this earlier. Patient says he wants to wait. RN will attempt again.

## 2015-11-03 NOTE — Progress Notes (Addendum)
Consent signed and placed in chart. Patient does not write, so an 'x' marks his personal signature and consent.

## 2015-11-03 NOTE — Consult Note (Signed)
[facility] Vascular Consult Note  MRN : [id number]  Austin [name] is a 74 y.o. (10-31-1941) male who presents with chief complaint of  Chief Complaint  Patient presents with  . Hemoptysis  .  History of Present Illness: Patient presents with couple day history of coughing up blood.  Has chronic lung disease and now concern for a malignancy.  Was on Eliquis for PE two months ago and this has been stopped.  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  Planning discharge for the patient and outpatient follow up with pulmonary to consider Bronchoscopy to evaluate the possible mass as a cause of his hemoptysis so will not be sent home on anticoagulation. 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    Current Facility-Administered Medications  Medication Dose Route Frequency Provider Last Rate Last Dose  . acetaminophen (TYLENOL) tablet 650 mg  650 mg Oral Q6H PRN Arnaldo Natal, MD       Or  . acetaminophen (TYLENOL) suppository 650 mg  650 mg Rectal Q6H PRN Arnaldo Natal, MD      . albuterol (PROVENTIL) (2.5 MG/3ML) 0.083% nebulizer solution 2.5 mg  2.5 mg Nebulization Q6H PRN Arnaldo Natal, MD   2.5 mg at 11/02/15 1659  . antiseptic oral rinse (CPC / CETYLPYRIDINIUM CHLORIDE 0.05%) solution 7 mL  7 mL Mouth Rinse BID Milagros Loll, MD   7 mL at 11/03/15 0926  . dextrose 5 %-0.9 % sodium chloride infusion   Intravenous Continuous Wyatt Haste, MD 50 mL/hr at 11/03/15 [phone]    . docusate sodium (COLACE) capsule 200 mg  200 mg Oral BID Milagros Loll, MD   200 mg at 11/03/15 0925  . furosemide (LASIX) tablet 20 mg  20 mg Oral Daily Shane Crutch, MD   20 mg at 11/03/15 0925  . insulin aspart (novoLOG) injection 0-5 Units  0-5 Units Subcutaneous QHS Arnaldo Natal, MD   0 Units at 10/31/15 2200  . insulin aspart (novoLOG) injection 0-9 Units  0-9 Units Subcutaneous TID WC Arnaldo Natal, MD   1 Units at  11/03/15 [phone]  . iohexol (OMNIPAQUE) 300 MG/ML solution 75 mL  75 mL Intravenous Once PRN Milagros Loll, MD   75 mL at 11/01/15 1506  . iohexol (OMNIPAQUE) 350 MG/ML injection 75 mL  75 mL Intravenous Once PRN Srikar Sudini, MD      . lisinopril (PRINIVIL,ZESTRIL) tablet 40 mg  40 mg Oral Daily Arnaldo Natal, MD   40 mg at 11/03/15 0925  . metoprolol tartrate (LOPRESSOR) tablet 12.5 mg  12.5 mg Oral BID Arnaldo Natal, MD   12.5 mg at 11/03/15 0925  . mometasone-formoterol (DULERA) 100-5 MCG/ACT inhaler 2 puff  2 puff Inhalation BID Arnaldo Natal, MD   2 puff at 11/03/15 [phone]  . morphine 2 MG/ML injection 1 mg  1 mg Intravenous Q3H PRN Arnaldo Natal, MD      . nitroGLYCERIN (NITROSTAT) SL tablet 0.4 mg  0.4 mg Sublingual Q5 Min x 3 PRN Arnaldo Natal, MD      . ondansetron [facility]) tablet 4 mg  4 mg Oral Q6H PRN Arnaldo Natal, MD       Or  . ondansetron [facility]) injection 4 mg  4 mg Intravenous Q6H PRN Arnaldo Natal, MD   4 mg at 11/01/15 1411  . pantoprazole (PROTONIX) EC tablet 40 mg  40 mg  Oral Daily Arnaldo Natal, MD   40 mg at 11/03/15 1610  . polyethylene glycol (MIRALAX / GLYCOLAX) packet 17 g  17 g Oral Daily PRN Arnaldo Natal, MD      . polyethylene glycol (MIRALAX / GLYCOLAX) packet 17 g  17 g Oral Daily PRN Srikar Sudini, MD      . pravastatin (PRAVACHOL) tablet 40 mg  40 mg Oral q1800 Arnaldo Natal, MD   40 mg at 11/02/15 1659  . sodium chloride 0.9 % injection 3 mL  3 mL Intravenous Q12H Arnaldo Natal, MD   3 mL at 11/01/15 1000  . tiotropium (SPIRIVA) inhalation capsule 18 mcg  18 mcg Inhalation Daily Arnaldo Natal, MD   18 mcg at 11/03/15 9604    Past Medical History  Diagnosis Date  . COPD (chronic obstructive pulmonary disease) (HCC)   . Gastric ulcer   . DVT (deep venous thrombosis) (HCC)   . PE (pulmonary embolism)   . Hypertension   . Hypercholesteremia   . Diabetes mellitus without complication (HCC)   . GERD  (gastroesophageal reflux disease)   . Atrial fibrillation (HCC)   . Asthma   . CHF (congestive heart failure) [facility])     Past Surgical History  Procedure Laterality Date  . Hernia repair    . Prostate surgery      Social History Social History  Substance Use Topics  . Smoking status: Current Every Day Smoker -- 0.50 packs/day for 45 years    Types: Cigarettes    Last Attempt to Quit: 07/04/2015  . Smokeless tobacco: None  . Alcohol Use: No  No IVDU  Family History Family History  Problem Relation Age of Onset  . CAD Brother   . Congestive Heart Failure Brother   no bleeding disorders or clotting disorders  Allergies  Allergen Reactions  . Contrast Media [Iodinated Diagnostic Agents] Nausea And Vomiting  . Other Other (See Comments)    Cannot have "high doses of anesthesia because of his lungs"     REVIEW OF SYSTEMS (Negative unless checked)  Constitutional: [] Weight loss  [] Fever  [] Chills Cardiac: [] Chest pain   [] Chest pressure   [] Palpitations   [] Shortness of breath when laying flat   [] Shortness of breath at rest   [] Shortness of breath with exertion. Vascular:  [] Pain in legs with walking   [] Pain in legs at rest   [] Pain in legs when laying flat   [] Claudication   [] Pain in feet when walking  [] Pain in feet at rest  [] Pain in feet when laying flat   [x] History of DVT   [x] Phlebitis   [] Swelling in legs   [] Varicose veins   [] Non-healing ulcers Pulmonary:   [] Uses home oxygen   [x] Productive cough   [x] Hemoptysis   [] Wheeze  [x] COPD   [] Asthma Neurologic:  [] Dizziness  [] Blackouts   [] Seizures   [] History of stroke   [] History of TIA  [] Aphasia   [] Temporary blindness   [] Dysphagia   [] Weakness or numbness in arms   [] Weakness or numbness in legs Musculoskeletal:  [] Arthritis   [] Joint swelling   [] Joint pain   [] Low back pain Hematologic:  [] Easy bruising  [x] Easy bleeding   [] Hypercoagulable state   [] Anemic  [] Hepatitis Gastrointestinal:  [] Blood in stool    [] Vomiting blood  [] Gastroesophageal reflux/heartburn   [] Difficulty swallowing. Genitourinary:  [] Chronic kidney disease   [] Difficult urination  [] Frequent urination  [] Burning with urination   [] Blood in urine Skin:  [] Rashes   []   Ulcers   [] Wounds Psychological:  [] History of anxiety   []  History of major depression.  Physical Examination  Filed Vitals:   11/02/15 1117 11/02/15 2004 11/02/15 2215 11/03/15 0502  BP: 128/76 135/82 112/76 147/83  Pulse: 59 63 60 50  Temp: 97.3 F (36.3 C) 97.5 F (36.4 C)  98.1 F (36.7 C)  TempSrc: Axillary Oral    Resp: 16 18  18   Height:      Weight:    78.019 kg (172 lb)  SpO2: 95% 95%  96%   Body mass index is 23.32 kg/(m^2). Gen:  WD/WN, NAD Head: [place]/AT, No temporalis wasting. Prominent temp pulse not noted. Ear/Nose/Throat: Hearing grossly intact, nares w/o erythema or drainage, poor dentition Eyes: PERRLA, EOMI.  Neck: Supple, no nuchal rigidity.  No JVD.  Pulmonary:  Good air movement, clear to auscultation bilaterally.  Cardiac: RRR, normal S1, S2 Vascular:  Vessel Right Left  Radial Palpable Palpable  Ulnar Palpable Palpable  Brachial Palpable Palpable  Carotid Palpable, without bruit Palpable, without bruit  Aorta Not palpable N/A  Femoral Palpable Palpable  Popliteal Palpable Palpable  PT Not Palpable Palpable  DP Palpable Not Palpable   Gastrointestinal: soft, non-tender/non-distended. No guarding/reflex. No masses, surgical incisions, or scars. Musculoskeletal: M/S 5/5 throughout.  Extremities without ischemic changes.  No deformity or atrophy. Mild RLE swelling Neurologic: CN 2-12 intact. Pain and light touch intact in extremities.  Symmetrical.  Speech is fluent. Motor exam as listed above. Psychiatric: Judgment intact, Mood & affect appropriate for pt's clinical situation. Dermatologic: No rashes or ulcers noted.  No cellulitis or open wounds. Lymph : No Cervical, Axillary, or Inguinal  lymphadenopathy.       CBC Lab Results  Component Value Date   WBC 4.8 11/01/2015   HGB 13.7 11/01/2015   HCT 41.8 11/01/2015   MCV 93.4 11/01/2015   PLT 166 11/01/2015    BMET    Component Value Date/Time   NA 140 11/01/2015 0437   NA 141 04/05/2015 1353   K 4.3 11/01/2015 0437   K 3.9 04/05/2015 1353   CL 109 11/01/2015 0437   CL 104 04/05/2015 1353   CO2 25 11/01/2015 0437   CO2 30 04/05/2015 1353   GLUCOSE 78 11/01/2015 0437   GLUCOSE 108* 04/05/2015 1353   BUN 22* 11/01/2015 0437   BUN 12 04/05/2015 1353   CREATININE 1.29* 11/01/2015 0437   CREATININE 1.11 04/05/2015 1353   CALCIUM 9.9 11/01/2015 0437   CALCIUM 10.0 04/05/2015 1353   GFRNONAA 53* 11/01/2015 0437   GFRNONAA >60 04/05/2015 1353   GFRAA >60 11/01/2015 0437   GFRAA >60 04/05/2015 1353   Estimated Creatinine Clearance: 56 mL/min (by C-G formula based on Cr of 1.29).  COAG Lab Results  Component Value Date   INR 1.32 10/31/2015   INR 1.40 09/15/2015   INR 1.23 08/24/2015    Radiology Dg Chest 2 View  10/31/2015  CLINICAL DATA:  Hemoptysis.  Weakness and cough for 2 weeks. EXAM: CHEST  2 VIEW COMPARISON:  Radiographs and CT September 2016 FINDINGS: Stable cardiomegaly and mediastinal contours. Stable hyperinflation and emphysema. The question right upper lobe nodule on prior CT is tentatively identified. The right middle lobe nodule on prior CT is not well seen. There is no consolidation to suggest pneumonia. No pulmonary edema, pleural effusion or pneumothorax. Osseous structures are stable. IMPRESSION: 1. Stable cardiomegaly. 2. Stable hyperinflation and emphysema. Right upper lobe nodule on prior CT is tentatively seen. Right middle lobe nodule  is not identified. No superimposed acute process. Electronically Signed   By: Rubye Oaks M.D.   On: 10/31/2015 03:03   Ct Angio Chest Pe W/cm &/or Wo Cm  11/01/2015  CLINICAL DATA:  Hemoptysis EXAM: CT ANGIOGRAPHY CHEST WITH CONTRAST TECHNIQUE:  Multidetector CT imaging of the chest was performed using the standard protocol during bolus administration of intravenous contrast. Multiplanar CT image reconstructions and MIPs were obtained to evaluate the vascular anatomy. CONTRAST:  1 OMNIPAQUE IOHEXOL 300 MG/ML  SOLN COMPARISON:  08/24/2015 FINDINGS: There is sequela of prior thromboembolism. There is filling defect along the wall of the right main and lower lobe pulmonary arteries. There has been further remodeling of this thrombus compared to the prior study as the contour ir of the mural thrombus is smoother. There is a web-like area of filling defect in a right upper lobe pulmonary artery branch on image 77. There is also web-like low density in the pulmonary artery branch to the lingula. There are no new filling defects in the pulmonary arterial tree to suggest new or acute pulmonary thromboembolism. Small mediastinal nodes are stable. No pericardial effusion. There continues to be abnormal dilatation of the right ventricle with respect to the left ventricle. No pneumothorax.  Small left pleural effusion. Stable 16 mm spiculated right upper lobe pulmonary nodule. New spiculated sub solid nodule in the right upper lobe on image 65 measures 8 mm. 11 mm spiculated right middle lobe nodule is stable on image 96. Advanced centrilobular emphysema. 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 Review of the MIP images confirms the above findings. IMPRESSION: No evidence of acute pulmonary thromboembolism. Sequela of prior pulmonary embolism is noted. 16 mm right upper lobe nodule and 11 mm right middle lobe nodule are stable. There is a new sub solid nodule in the right upper lobe on image 65 measuring 8 mm. Again, malignancy is a strong concern. PET-CT may be helpful. 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 Electronically Signed   By: Jolaine Click M.D.   On: 11/01/2015 15:49   US Venous Img Lower Bilateral  10/31/2015   CLINICAL DATA:  Pulmonary embolus. EXAM: BILATERAL LOWER EXTREMITY VENOUS DOPPLER ULTRASOUND TECHNIQUE: Gray-scale sonography with graded compression, as well as color Doppler and duplex ultrasound were performed to evaluate the lower extremity deep venous systems from the level of the common femoral vein and including the common femoral, femoral, profunda femoral, popliteal and calf veins including the posterior tibial, peroneal and gastrocnemius veins when visible. The superficial great saphenous vein was also interrogated. Spectral Doppler was utilized to evaluate flow at rest and with distal augmentation maneuvers in the common femoral, femoral and popliteal veins. COMPARISON:  None. FINDINGS: RIGHT LOWER EXTREMITY Common Femoral Vein: No evidence of thrombus. Normal compressibility, respiratory phasicity and response to augmentation. Saphenofemoral Junction: No evidence of thrombus. Normal compressibility and flow on color Doppler imaging. Profunda Femoral Vein: No evidence of thrombus. Normal compressibility and flow on color Doppler imaging. Femoral Vein: No evidence of thrombus. Normal compressibility, respiratory phasicity and response to augmentation. Popliteal Vein: Nonocclusive thrombus, unchanged from previous studies. This was present 04/11/2015 and 08/25/2015. Calf Veins: No evidence of thrombus. Normal compressibility and flow on color Doppler imaging. Superficial Great Saphenous Vein: No evidence of thrombus. Normal compressibility and flow on color Doppler imaging. Venous Reflux:  None. Other Findings:  None. LEFT LOWER EXTREMITY Common Femoral Vein: No evidence of thrombus. Normal compressibility, respiratory phasicity and response to augmentation.  Saphenofemoral Junction: No evidence of thrombus. Normal compressibility and flow on color Doppler imaging. Profunda Femoral Vein: No evidence of thrombus. Normal compressibility and flow on color Doppler imaging. Femoral Vein: No evidence of thrombus.  Normal compressibility, respiratory phasicity and response to augmentation. Popliteal Vein: No evidence of thrombus. Normal compressibility, respiratory phasicity and response to augmentation. Calf Veins: No evidence of thrombus. Normal compressibility and flow on color Doppler imaging. Superficial Great Saphenous Vein: No evidence of thrombus. Normal compressibility and flow on color Doppler imaging. Venous Reflux:  None. Other Findings:  None. 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 No acute DVT. Electronically Signed   By: Marin Robertshristopher  Mattern M.D.   On: 10/31/2015 18:39      Assessment/Plan 1. Recent PE with hemoptysis prompting cessation of anticoagulation. Would be a clear indication for IVC filter placement.  Have discussed with patient and his wife and they are agreeable to proceed with filter placement today.  Risks and benefits discussed.  Discussed that filter could be removed in 3-6 months 2. Residual LE DVT.  Appears more chronic.  That will likely result in some pain and swelling long term without anticoagulation, and so would recommend compression stockings and elevation.   3. Hemoptysis. Outpatient work up with pulmonary planned.  This is precluding continued Eliquis.   Shavon Ashmore, MD  11/03/2015 11:38 AM

## 2015-11-03 NOTE — Care Management (Signed)
Patient will have IVC filter placed today and attending anticipates patient will discharge after the procedure if there are no complications.  Spoke with patient granddaughter Barnett ApplebaumShanice (patient's wife is at work).  Shanice stays with the patient and says patient is never left alone.  Confirmed he still has continuous 02 in the home but patient only uses it when he feels winded.  Discussed at discharge portable tank must be brought to the hospital for transport home.  Shanice does not feel that will need home health nurse.  Advised her to speak with patient's wife.

## 2015-11-03 NOTE — Care Management (Signed)
Anticipate IVC filter placement but vascular.  Discussed during progression of need to prevent decline in funational status during this hospitalization

## 2015-11-03 NOTE — Care Management (Signed)
Md has ordered home health nurse and physical therapy.  Wife is now on the unit and verbalizes concern that [employer] will not cover home health.  Medicare is patient's secondary insurance.  Agency preference is [facility].  Informed wife that [facility] would check for insurance coverage and contact her.  Discussed that home visit may be delayed due to the holiday and insurance approval.  Trenton GammonVerbalizes understanding

## 2015-11-09 ENCOUNTER — Other Ambulatory Visit: Payer: Self-pay | Admitting: Specialist

## 2015-11-09 DIAGNOSIS — R918 Other nonspecific abnormal finding of lung field: Secondary | ICD-10-CM

## 2015-11-30 ENCOUNTER — Ambulatory Visit: Payer: Medicare Other | Admitting: Family

## 2015-12-17 ENCOUNTER — Ambulatory Visit: Payer: Medicare Other | Admitting: Family

## 2016-03-10 ENCOUNTER — Ambulatory Visit
Admission: RE | Admit: 2016-03-10 | Discharge: 2016-03-10 | Disposition: A | Discharge status: Home / Self Care | Payer: Medicare Other | Source: Ambulatory Visit | Attending: Specialist | Admitting: Specialist

## 2016-03-10 DIAGNOSIS — I272 Other secondary pulmonary hypertension: Secondary | ICD-10-CM | POA: Insufficient documentation

## 2016-03-10 DIAGNOSIS — J439 Emphysema, unspecified: Secondary | ICD-10-CM | POA: Diagnosis not present

## 2016-03-10 DIAGNOSIS — I700 Atherosclerosis of aorta: Secondary | ICD-10-CM | POA: Diagnosis not present

## 2016-03-10 DIAGNOSIS — I517 Cardiomegaly: Secondary | ICD-10-CM | POA: Diagnosis not present

## 2016-03-10 DIAGNOSIS — I2510 Atherosclerotic heart disease of native coronary artery without angina pectoris: Secondary | ICD-10-CM | POA: Insufficient documentation

## 2016-03-10 DIAGNOSIS — R918 Other nonspecific abnormal finding of lung field: Secondary | ICD-10-CM | POA: Diagnosis not present

## 2016-07-18 ENCOUNTER — Other Ambulatory Visit: Payer: Self-pay | Admitting: Vascular Surgery

## 2016-07-24 ENCOUNTER — Ambulatory Visit
Admission: RE | Admit: 2016-07-24 | Discharge: 2016-07-24 | Disposition: A | Discharge status: Home / Self Care | Payer: Medicare Other | Source: Ambulatory Visit | Attending: Vascular Surgery | Admitting: Vascular Surgery

## 2016-07-24 ENCOUNTER — Encounter: Payer: Self-pay | Admitting: *Deleted

## 2016-07-24 ENCOUNTER — Encounter
Admission: RE | Disposition: A | Discharge status: Home / Self Care | Payer: Self-pay | Source: Ambulatory Visit | Attending: Vascular Surgery

## 2016-07-24 DIAGNOSIS — I872 Venous insufficiency (chronic) (peripheral): Secondary | ICD-10-CM | POA: Insufficient documentation

## 2016-07-24 DIAGNOSIS — I499 Cardiac arrhythmia, unspecified: Secondary | ICD-10-CM | POA: Insufficient documentation

## 2016-07-24 DIAGNOSIS — I509 Heart failure, unspecified: Secondary | ICD-10-CM | POA: Insufficient documentation

## 2016-07-24 DIAGNOSIS — I110 Hypertensive heart disease with heart failure: Secondary | ICD-10-CM | POA: Diagnosis not present

## 2016-07-24 DIAGNOSIS — M7989 Other specified soft tissue disorders: Secondary | ICD-10-CM | POA: Insufficient documentation

## 2016-07-24 DIAGNOSIS — Z91041 Radiographic dye allergy status: Secondary | ICD-10-CM | POA: Insufficient documentation

## 2016-07-24 DIAGNOSIS — E785 Hyperlipidemia, unspecified: Secondary | ICD-10-CM | POA: Diagnosis not present

## 2016-07-24 DIAGNOSIS — J439 Emphysema, unspecified: Secondary | ICD-10-CM | POA: Insufficient documentation

## 2016-07-24 DIAGNOSIS — I82409 Acute embolism and thrombosis of unspecified deep veins of unspecified lower extremity: Secondary | ICD-10-CM | POA: Insufficient documentation

## 2016-07-24 DIAGNOSIS — I999 Unspecified disorder of circulatory system: Secondary | ICD-10-CM | POA: Diagnosis not present

## 2016-07-24 DIAGNOSIS — Z87891 Personal history of nicotine dependence: Secondary | ICD-10-CM | POA: Insufficient documentation

## 2016-07-24 DIAGNOSIS — R042 Hemoptysis: Secondary | ICD-10-CM | POA: Diagnosis not present

## 2016-07-24 DIAGNOSIS — Z9889 Other specified postprocedural states: Secondary | ICD-10-CM | POA: Diagnosis not present

## 2016-07-24 DIAGNOSIS — E669 Obesity, unspecified: Secondary | ICD-10-CM | POA: Insufficient documentation

## 2016-07-24 DIAGNOSIS — Z7951 Long term (current) use of inhaled steroids: Secondary | ICD-10-CM | POA: Insufficient documentation

## 2016-07-24 DIAGNOSIS — M79604 Pain in right leg: Secondary | ICD-10-CM | POA: Insufficient documentation

## 2016-07-24 DIAGNOSIS — I739 Peripheral vascular disease, unspecified: Secondary | ICD-10-CM | POA: Diagnosis not present

## 2016-07-24 DIAGNOSIS — I87091 Postthrombotic syndrome with other complications of right lower extremity: Secondary | ICD-10-CM | POA: Insufficient documentation

## 2016-07-24 DIAGNOSIS — E119 Type 2 diabetes mellitus without complications: Secondary | ICD-10-CM | POA: Diagnosis not present

## 2016-07-24 DIAGNOSIS — M79605 Pain in left leg: Secondary | ICD-10-CM | POA: Diagnosis not present

## 2016-07-24 DIAGNOSIS — I2699 Other pulmonary embolism without acute cor pulmonale: Secondary | ICD-10-CM | POA: Diagnosis not present

## 2016-07-24 DIAGNOSIS — Z452 Encounter for adjustment and management of vascular access device: Secondary | ICD-10-CM | POA: Insufficient documentation

## 2016-07-24 DIAGNOSIS — Z9049 Acquired absence of other specified parts of digestive tract: Secondary | ICD-10-CM | POA: Diagnosis not present

## 2016-07-24 DIAGNOSIS — Z7982 Long term (current) use of aspirin: Secondary | ICD-10-CM | POA: Insufficient documentation

## 2016-07-24 HISTORY — PX: PERIPHERAL VASCULAR CATHETERIZATION: SHX172C

## 2016-07-24 LAB — GLUCOSE, CAPILLARY: Glucose-Capillary: 288 mg/dL — ABNORMAL HIGH (ref 65–99)

## 2016-07-24 SURGERY — IVC FILTER REMOVAL
Anesthesia: Moderate Sedation

## 2016-07-24 MED ORDER — HYDROMORPHONE HCL 1 MG/ML IJ SOLN: 1.0000 mg | Freq: Once | INTRAMUSCULAR | Status: DC

## 2016-07-24 MED ORDER — FAMOTIDINE 20 MG PO TABS
ORAL_TABLET | ORAL | Status: AC
  Administered 2016-07-24: 20 mg
  Filled 2016-07-24: qty 1

## 2016-07-24 MED ORDER — METHYLPREDNISOLONE SODIUM SUCC 125 MG IJ SOLR
INTRAMUSCULAR | Status: AC
  Administered 2016-07-24: 125 mg
  Filled 2016-07-24: qty 2

## 2016-07-24 MED ORDER — FENTANYL CITRATE (PF) 100 MCG/2ML IJ SOLN
INTRAMUSCULAR | Status: AC
  Filled 2016-07-24: qty 2

## 2016-07-24 MED ORDER — MIDAZOLAM HCL 5 MG/5ML IJ SOLN
INTRAMUSCULAR | Status: AC
  Filled 2016-07-24: qty 5

## 2016-07-24 MED ORDER — IOPAMIDOL (ISOVUE-300) INJECTION 61%
INTRAVENOUS | Status: DC | PRN
  Administered 2016-07-24: 15 mL via INTRA_ARTERIAL

## 2016-07-24 MED ORDER — FENTANYL CITRATE (PF) 100 MCG/2ML IJ SOLN
INTRAMUSCULAR | Status: DC | PRN
  Administered 2016-07-24: 50 ug via INTRAVENOUS

## 2016-07-24 MED ORDER — MIDAZOLAM HCL 2 MG/2ML IJ SOLN
INTRAMUSCULAR | Status: DC | PRN
  Administered 2016-07-24: 2 mg via INTRAVENOUS

## 2016-07-24 MED ORDER — DEXTROSE 5 % IV SOLN
INTRAVENOUS | Status: AC
  Filled 2016-07-24 (×28): qty 1.5

## 2016-07-24 MED ORDER — DEXTROSE 5 % IV SOLN: 1.5000 g | INTRAVENOUS | Status: DC

## 2016-07-24 MED ORDER — HEPARIN (PORCINE) IN NACL 2-0.9 UNIT/ML-% IJ SOLN
INTRAMUSCULAR | Status: AC
  Filled 2016-07-24: qty 500

## 2016-07-24 MED ORDER — SODIUM CHLORIDE 0.9 % IV SOLN
INTRAVENOUS | Status: DC
  Administered 2016-07-24: 08:00:00 via INTRAVENOUS

## 2016-07-24 MED ORDER — ONDANSETRON HCL 4 MG/2ML IJ SOLN: 4.0000 mg | Freq: Four times a day (QID) | INTRAMUSCULAR | Status: DC | PRN

## 2016-07-24 MED ORDER — LIDOCAINE-EPINEPHRINE (PF) 1 %-1:200000 IJ SOLN
INTRAMUSCULAR | Status: AC
  Filled 2016-07-24: qty 30

## 2016-07-24 SURGICAL SUPPLY — 5 items
ENSNARE 18-30 (MISCELLANEOUS) ×3 IMPLANT
PACK ANGIOGRAPHY (CUSTOM PROCEDURE TRAY) ×3 IMPLANT
SET VENACAVA FILTER RETRIEVAL (MISCELLANEOUS) ×3 IMPLANT
TOWEL OR 17X26 4PK STRL BLUE (TOWEL DISPOSABLE) ×3 IMPLANT
WIRE J 3MM .035X145CM (WIRE) ×3 IMPLANT

## 2016-07-24 NOTE — Op Note (Signed)
[facility]   OPERATIVE NOTE    PRE-OPERATIVE DIAGNOSIS:  1. DVT 2. status post IVC filter placement  POST-OPERATIVE DIAGNOSIS: Same as above  PROCEDURE: 1. Ultrasound guidance for vascular access right jugular vein 2. Catheter placement into inferior vena cava from right jugular vein 3. Inferior venacavogram 4. Retrieval of Cook Celect IVC filter  SURGEON: Festus BarrenJason Tennelle Taflinger, MD  ASSISTANT(S): None  ANESTHESIA: Local with moderate conscious sedation for approximately 20 minutes using 2 mg of Versed and 50 mcg of Fentanyl  ESTIMATED BLOOD LOSS: minimal  FINDING(S): 1. small amount of thrombus on top of the filter  SPECIMEN(S): IVC filter  INDICATIONS:  Patient is a 75 yo AAM who presents with a previous history of IVC filter placement. Patient has no acute DVT and no longer needs this filter. The patient remains on anticoagulation. Risks and benefits were discussed, and informed consent was obtained.  DESCRIPTION: After obtaining full informed written consent, the patient was brought back to the vascular suite and placed supine upon the table.Moderate conscious sedation was administered during a face to face encounter with the patient throughout the procedure with my supervision of the RN administering medicines and monitoring the patient's vital signs, pulse oximetry, telemetry and mental status throughout from the start of the procedure until the patient was taken to the recovery room.  After obtaining adequate anesthesia, the patient was prepped and draped in the standard fashion. The right jugular vein was visualized with ultrasound and found to be widely patent. It was then accessed under direct ultrasound guidance without difficulty with the Seldinger needle and a permanent image was recorded. A J-wire was placed. After skin nick and dilatation, the retrieval sheath was placed over the wire and advanced into the inferior vena cava. Inferior vena  cava was imaged and found to be widely patent on inferior venacavogram with the exception of a small amount of thrombus on the top of th filter. The filter was reasonably straight in its orientation. The retrieval snare was then placed through the sheath and the hook of the filter was snared without difficulty. The sheath was then advanced, and the filter was collapsed and brought into the sheath in its entirety. It was then removed from the body in its entirety. The retrieval sheath was then removed. Pressure was held at the access site and sterile dressing was placed. The patient was taken to the recovery room in stable condition having tolerated the procedure well.  COMPLICATIONS: None  CONDITION: Stable   Lydell Moga 07/24/2016 9:03 AM

## 2016-07-24 NOTE — H&P (Signed)
  [facility] History & Physical Update  The patient was interviewed and re-examined.  The patient's previous History and Physical has been reviewed and is unchanged.  There is no change in the plan of care. We plan to proceed with the scheduled procedure.  DEW,JASON, MD  07/24/2016, 8:12 AM

## 2016-07-24 NOTE — Discharge Instructions (Signed)
Inferior Vena Cava Filter Insertion, Care After  Refer to this sheet in the next few weeks. These instructions provide you with information on caring for yourself after your procedure. Your health care provider may also give you more specific instructions. Your treatment has been planned according to current medical practices, but problems sometimes occur. Call your health care provider if you have any problems or questions after your procedure.  WHAT TO EXPECT AFTER THE PROCEDURE  After your procedure, it is typical to have the following:   Mild pain in the area where the filter was inserted.   Mild bruising in the area where the filter was inserted.  HOME CARE INSTRUCTIONS   You will be given medicine to control pain. Only take over-the-counter or prescription medicines for pain, fever, or discomfort as directed by your health care provider.   A bandage (dressing) has been placed over the insertion site. Follow your health care provider's instructions on how to care for it.   Keep the insertion site clean and dry.   Do not soak in a bath tub or pool until the filter insertion site has healed.   Do not drive if you are taking narcotic pain medicines. Follow your health care provider's instructions about driving.   Do not return to work or school until your health care provider says it is okay.    Keep all follow-up appointments.   SEEK IMMEDIATE MEDICAL CARE IF:   You develop swelling and discoloration or pain in the legs.   Your legs become pale and cold or blue.   You develop shortness of breath, feel faint, or pass out.   You develop chest pain, a cough, or difficulty breathing.   You cough up blood.   You develop a rash or feel you are having problems that may be a side effect of medicines.   You develop weakness, difficulty moving your arms or legs, or balance problems.   You develop problems with speech or vision.     This information is not intended to replace advice given to you by your  health care provider. Make sure you discuss any questions you have with your health care provider.     Document Released: 09/17/2013 Document Reviewed: 09/17/2013  Elsevier Interactive Patient Education 2016 [employer]

## 2016-12-26 ENCOUNTER — Emergency Department: Payer: Medicare Other

## 2016-12-26 ENCOUNTER — Inpatient Hospital Stay
Admission: EM | Admit: 2016-12-26 | Discharge: 2016-12-27 | DRG: 192 | Disposition: A | Discharge status: Home / Self Care | Payer: Medicare Other | Attending: Internal Medicine | Admitting: Internal Medicine

## 2016-12-26 ENCOUNTER — Encounter: Payer: Self-pay | Admitting: Emergency Medicine

## 2016-12-26 DIAGNOSIS — Z91041 Radiographic dye allergy status: Secondary | ICD-10-CM

## 2016-12-26 DIAGNOSIS — E785 Hyperlipidemia, unspecified: Secondary | ICD-10-CM | POA: Diagnosis not present

## 2016-12-26 DIAGNOSIS — J441 Chronic obstructive pulmonary disease with (acute) exacerbation: Secondary | ICD-10-CM | POA: Diagnosis present

## 2016-12-26 DIAGNOSIS — Y92009 Unspecified place in unspecified non-institutional (private) residence as the place of occurrence of the external cause: Secondary | ICD-10-CM

## 2016-12-26 DIAGNOSIS — R55 Syncope and collapse: Secondary | ICD-10-CM | POA: Diagnosis not present

## 2016-12-26 DIAGNOSIS — Z9981 Dependence on supplemental oxygen: Secondary | ICD-10-CM | POA: Diagnosis not present

## 2016-12-26 DIAGNOSIS — R0789 Other chest pain: Secondary | ICD-10-CM | POA: Diagnosis not present

## 2016-12-26 DIAGNOSIS — Z8249 Family history of ischemic heart disease and other diseases of the circulatory system: Secondary | ICD-10-CM | POA: Diagnosis not present

## 2016-12-26 DIAGNOSIS — J101 Influenza due to other identified influenza virus with other respiratory manifestations: Secondary | ICD-10-CM | POA: Diagnosis not present

## 2016-12-26 DIAGNOSIS — E119 Type 2 diabetes mellitus without complications: Secondary | ICD-10-CM | POA: Diagnosis not present

## 2016-12-26 DIAGNOSIS — R778 Other specified abnormalities of plasma proteins: Secondary | ICD-10-CM

## 2016-12-26 DIAGNOSIS — W19XXXA Unspecified fall, initial encounter: Secondary | ICD-10-CM | POA: Diagnosis present

## 2016-12-26 DIAGNOSIS — R7989 Other specified abnormal findings of blood chemistry: Secondary | ICD-10-CM | POA: Diagnosis present

## 2016-12-26 DIAGNOSIS — I4891 Unspecified atrial fibrillation: Secondary | ICD-10-CM | POA: Diagnosis present

## 2016-12-26 DIAGNOSIS — Z86711 Personal history of pulmonary embolism: Secondary | ICD-10-CM

## 2016-12-26 DIAGNOSIS — Z86718 Personal history of other venous thrombosis and embolism: Secondary | ICD-10-CM

## 2016-12-26 DIAGNOSIS — I110 Hypertensive heart disease with heart failure: Secondary | ICD-10-CM | POA: Diagnosis not present

## 2016-12-26 DIAGNOSIS — Z884 Allergy status to anesthetic agent status: Secondary | ICD-10-CM

## 2016-12-26 DIAGNOSIS — Z7984 Long term (current) use of oral hypoglycemic drugs: Secondary | ICD-10-CM

## 2016-12-26 DIAGNOSIS — Z7982 Long term (current) use of aspirin: Secondary | ICD-10-CM

## 2016-12-26 DIAGNOSIS — J209 Acute bronchitis, unspecified: Secondary | ICD-10-CM | POA: Diagnosis not present

## 2016-12-26 DIAGNOSIS — F17210 Nicotine dependence, cigarettes, uncomplicated: Secondary | ICD-10-CM | POA: Diagnosis not present

## 2016-12-26 DIAGNOSIS — J440 Chronic obstructive pulmonary disease with acute lower respiratory infection: Secondary | ICD-10-CM | POA: Diagnosis not present

## 2016-12-26 DIAGNOSIS — Z79899 Other long term (current) drug therapy: Secondary | ICD-10-CM

## 2016-12-26 DIAGNOSIS — I509 Heart failure, unspecified: Secondary | ICD-10-CM | POA: Diagnosis not present

## 2016-12-26 LAB — BASIC METABOLIC PANEL
Anion gap: 7 (ref 5–15)
BUN: 23 mg/dL — AB (ref 6–20)
CALCIUM: 9.5 mg/dL (ref 8.9–10.3)
CHLORIDE: 99 mmol/L — AB (ref 101–111)
CO2: 27 mmol/L (ref 22–32)
Creatinine, Ser: 1.65 mg/dL — ABNORMAL HIGH (ref 0.61–1.24)
GFR calc Af Amer: 46 mL/min — ABNORMAL LOW (ref >60)
GFR calc non Af Amer: 39 mL/min — ABNORMAL LOW (ref >60)
GLUCOSE: 255 mg/dL — AB (ref 65–99)
Potassium: 4.2 mmol/L (ref 3.5–5.1)
Sodium: 133 mmol/L — ABNORMAL LOW (ref 135–145)

## 2016-12-26 LAB — BRAIN NATRIURETIC PEPTIDE: B Natriuretic Peptide: 1229 pg/mL — ABNORMAL HIGH (ref 0.0–100.0)

## 2016-12-26 LAB — PROTIME-INR
INR: 1.45
Prothrombin Time: 17.8 seconds — ABNORMAL HIGH (ref 11.4–15.2)

## 2016-12-26 LAB — CBC
HCT: 43.2 % (ref 40.0–52.0)
Hemoglobin: 13.8 g/dL (ref 13.0–18.0)
MCH: 28 pg (ref 26.0–34.0)
MCHC: 32.1 g/dL (ref 32.0–36.0)
MCV: 87.4 fL (ref 80.0–100.0)
PLATELETS: 155 10*3/uL (ref 150–440)
RBC: 4.94 MIL/uL (ref 4.40–5.90)
RDW: 17.1 % — ABNORMAL HIGH (ref 11.5–14.5)
WBC: 4.8 10*3/uL (ref 3.8–10.6)

## 2016-12-26 LAB — TROPONIN I: Troponin I: 0.11 ng/mL (ref <0.03)

## 2016-12-26 MED ORDER — ACETAMINOPHEN 650 MG RE SUPP: 650.0000 mg | Freq: Four times a day (QID) | RECTAL | Status: DC | PRN

## 2016-12-26 MED ORDER — ASPIRIN 81 MG PO CHEW
81.0000 mg | CHEWABLE_TABLET | Freq: Every day | ORAL | Status: DC
Start: 2016-12-26 — End: 2016-12-27
  Administered 2016-12-27: 81 mg via ORAL
  Filled 2016-12-26: qty 1

## 2016-12-26 MED ORDER — HYDROCODONE-ACETAMINOPHEN 5-325 MG PO TABS: 1.0000 | ORAL_TABLET | ORAL | Status: DC | PRN

## 2016-12-26 MED ORDER — POLYETHYLENE GLYCOL 3350 17 G PO PACK: 17.0000 g | PACK | Freq: Every day | ORAL | Status: DC | PRN

## 2016-12-26 MED ORDER — ENOXAPARIN SODIUM 40 MG/0.4ML ~~LOC~~ SOLN
40.0000 mg | Freq: Every day | SUBCUTANEOUS | Status: DC
  Administered 2016-12-26: 40 mg via SUBCUTANEOUS
  Filled 2016-12-26: qty 0.4

## 2016-12-26 MED ORDER — IPRATROPIUM-ALBUTEROL 0.5-2.5 (3) MG/3ML IN SOLN
3.0000 mL | Freq: Four times a day (QID) | RESPIRATORY_TRACT | Status: DC
  Administered 2016-12-27: 3 mL via RESPIRATORY_TRACT
  Filled 2016-12-26 (×2): qty 3

## 2016-12-26 MED ORDER — METHYLPREDNISOLONE SODIUM SUCC 125 MG IJ SOLR
60.0000 mg | Freq: Four times a day (QID) | INTRAMUSCULAR | Status: DC
Start: 2016-12-26 — End: 2016-12-27
  Administered 2016-12-26 – 2016-12-27 (×4): 60 mg via INTRAVENOUS
  Filled 2016-12-26 (×4): qty 2

## 2016-12-26 MED ORDER — SODIUM CHLORIDE 0.9% FLUSH
3.0000 mL | Freq: Two times a day (BID) | INTRAVENOUS | Status: DC
  Administered 2016-12-26 – 2016-12-27 (×2): 3 mL via INTRAVENOUS

## 2016-12-26 MED ORDER — GLIPIZIDE ER 5 MG PO TB24
5.0000 mg | ORAL_TABLET | Freq: Every day | ORAL | Status: DC
  Administered 2016-12-27: 5 mg via ORAL
  Filled 2016-12-26: qty 1

## 2016-12-26 MED ORDER — METOPROLOL TARTRATE 25 MG PO TABS
12.5000 mg | ORAL_TABLET | Freq: Two times a day (BID) | ORAL | Status: DC
  Administered 2016-12-27: 12.5 mg via ORAL
  Filled 2016-12-26 (×2): qty 1

## 2016-12-26 MED ORDER — ONDANSETRON HCL 4 MG PO TABS: 4.0000 mg | ORAL_TABLET | Freq: Four times a day (QID) | ORAL | Status: DC | PRN

## 2016-12-26 MED ORDER — TIOTROPIUM BROMIDE MONOHYDRATE 18 MCG IN CAPS
18.0000 ug | ORAL_CAPSULE | Freq: Every day | RESPIRATORY_TRACT | Status: DC
  Administered 2016-12-27: 18 ug via RESPIRATORY_TRACT
  Filled 2016-12-26: qty 5

## 2016-12-26 MED ORDER — ASPIRIN 81 MG PO CHEW
324.0000 mg | CHEWABLE_TABLET | Freq: Once | ORAL | Status: AC
  Administered 2016-12-26: 324 mg via ORAL
  Filled 2016-12-26: qty 4

## 2016-12-26 MED ORDER — GUAIFENESIN ER 600 MG PO TB12
600.0000 mg | ORAL_TABLET | Freq: Two times a day (BID) | ORAL | Status: DC
  Administered 2016-12-26 – 2016-12-27 (×2): 600 mg via ORAL
  Filled 2016-12-26 (×3): qty 1

## 2016-12-26 MED ORDER — ACETAMINOPHEN 325 MG PO TABS: 650.0000 mg | ORAL_TABLET | Freq: Four times a day (QID) | ORAL | Status: DC | PRN

## 2016-12-26 MED ORDER — NITROGLYCERIN 0.4 MG SL SUBL: 0.4000 mg | SUBLINGUAL_TABLET | SUBLINGUAL | Status: DC | PRN

## 2016-12-26 MED ORDER — SODIUM CHLORIDE 0.9% FLUSH: 3.0000 mL | INTRAVENOUS | Status: DC | PRN

## 2016-12-26 MED ORDER — ONDANSETRON HCL 4 MG/2ML IJ SOLN: 4.0000 mg | Freq: Four times a day (QID) | INTRAMUSCULAR | Status: DC | PRN

## 2016-12-26 MED ORDER — SODIUM CHLORIDE 0.9 % IV SOLN: 250.0000 mL | INTRAVENOUS | Status: DC | PRN

## 2016-12-26 MED ORDER — AZITHROMYCIN 500 MG PO TABS
500.0000 mg | ORAL_TABLET | Freq: Every day | ORAL | Status: DC
  Administered 2016-12-26: 500 mg via ORAL
  Filled 2016-12-26: qty 1

## 2016-12-26 MED ORDER — PRAVASTATIN SODIUM 20 MG PO TABS: 40.0000 mg | ORAL_TABLET | Freq: Every day | ORAL | Status: DC

## 2016-12-26 MED ORDER — INSULIN ASPART 100 UNIT/ML ~~LOC~~ SOLN
0.0000 [IU] | Freq: Three times a day (TID) | SUBCUTANEOUS | Status: DC
  Administered 2016-12-27: 2 [IU] via SUBCUTANEOUS
  Administered 2016-12-27: 5 [IU] via SUBCUTANEOUS
  Filled 2016-12-26: qty 5
  Filled 2016-12-26: qty 2

## 2016-12-26 MED ORDER — LISINOPRIL 20 MG PO TABS
40.0000 mg | ORAL_TABLET | Freq: Every day | ORAL | Status: DC
  Administered 2016-12-27: 40 mg via ORAL
  Filled 2016-12-26: qty 2

## 2016-12-26 MED ORDER — FUROSEMIDE 40 MG PO TABS
40.0000 mg | ORAL_TABLET | Freq: Two times a day (BID) | ORAL | Status: DC
  Administered 2016-12-27: 40 mg via ORAL
  Filled 2016-12-26: qty 1

## 2016-12-26 MED ORDER — IPRATROPIUM-ALBUTEROL 0.5-2.5 (3) MG/3ML IN SOLN
3.0000 mL | Freq: Once | RESPIRATORY_TRACT | Status: AC
  Administered 2016-12-26: 3 mL via RESPIRATORY_TRACT
  Filled 2016-12-26: qty 3

## 2016-12-26 NOTE — ED Notes (Signed)
Patient transported to X-ray 

## 2016-12-26 NOTE — ED Triage Notes (Signed)
Pt presents with shortness of breath and chest pain after getting some wood for the stove to head his house today and has also had a cough. Pt does use oxygen PRN.

## 2016-12-26 NOTE — H&P (Signed)
[employer] - [place] at [facility]   PATIENT NAME: Austin [name]    MR#:  [id number]  DATE OF BIRTH:  [birth year]  DATE OF ADMISSION:  12/26/2016  PRIMARY CARE PHYSICIAN: [facility]   REQUESTING/REFERRING PHYSICIAN: Willy Eddy  CHIEF COMPLAINT:   Chief Complaint  Patient presents with  . Chest Pain  . Shortness of Breath    HISTORY OF PRESENT ILLNESS: Austin [name]  is a 76 y.o. male with a known history of  Asthma, COPD, atrial fibrillation, diabetes type 2, history of DVT, GERD, hypercholesterolemia, essential hypertension as well as pulmonary embolism we uses oxygen at nighttime and when necessary oxygen during the day. Who is presenting to the emergency room complaining of shortness of breath and chest pain. Patient has had a cough ongoing for the past few days associated with shortness of breath he also has had some wheezing. Patient today was trying to get some log for the fireplace when he came back inside the house and had a episode where he fell forward therefore was brought to the hospital. He also has been having complaint of tightness in his chest. Currently he has no chest pain or tightness. Denies any nausea vomiting has had upper respiratory symptoms and nasal congestion. PAST MEDICAL HISTORY:   Past Medical History:  Diagnosis Date  . Asthma   . Atrial fibrillation (HCC)   . CHF (congestive heart failure) (HCC)   . COPD (chronic obstructive pulmonary disease) (HCC)   . Diabetes mellitus without complication (HCC)   . DVT (deep venous thrombosis) (HCC)   . Gastric ulcer   . GERD (gastroesophageal reflux disease)   . Hypercholesteremia   . Hypertension   . PE (pulmonary embolism)     PAST SURGICAL HISTORY: Past Surgical History:  Procedure Laterality Date  . HERNIA REPAIR    . PERIPHERAL VASCULAR CATHETERIZATION N/A 11/03/2015   Procedure: IVC Filter Insertion;  Surgeon: Annice Needy, MD;  Location: [facility] INVASIVE CV  LAB;  Service: Cardiovascular;  Laterality: N/A;  . PERIPHERAL VASCULAR CATHETERIZATION N/A 07/24/2016   Procedure: IVC Filter Removal;  Surgeon: Annice Needy, MD;  Location: [facility] INVASIVE CV LAB;  Service: Cardiovascular;  Laterality: N/A;  . PROSTATE SURGERY      SOCIAL HISTORY:  Social History  Substance Use Topics  . Smoking status: Current Every Day Smoker    Packs/day: 0.50    Years: 45.00    Types: Cigarettes    Last attempt to quit: 07/04/2015  . Smokeless tobacco: Never Used  . Alcohol use No    FAMILY HISTORY:  Family History  Problem Relation Age of Onset  . CAD Brother   . Congestive Heart Failure Brother     DRUG ALLERGIES:  Allergies  Allergen Reactions  . Contrast Media [Iodinated Diagnostic Agents] Nausea And Vomiting  . Other Other (See Comments)    Cannot have "high doses of anesthesia because of his lungs"    REVIEW OF SYSTEMS:   CONSTITUTIONAL: No fever, positive fatigue and weakness.  EYES: No blurred or double vision.  EARS, NOSE, AND THROAT: No tinnitus or ear pain.  RESPIRATORY: Positive cough, positive shortness of breath, positive wheezing or no hemoptysis.  CARDIOVASCULAR: No chest pain, orthopnea, edema.  GASTROINTESTINAL: No nausea, vomiting, diarrhea or abdominal pain.  GENITOURINARY: No dysuria, hematuria.  ENDOCRINE: No polyuria, nocturia,  HEMATOLOGY: No anemia, easy bruising or bleeding SKIN: No rash or lesion. MUSCULOSKELETAL: No joint pain or arthritis.   NEUROLOGIC:  No tingling, numbness, weakness.  PSYCHIATRY: No anxiety or depression.   MEDICATIONS AT HOME:  Prior to Admission medications   I have reviewed patient's home medication personally Glipizide XL 5 mg daily Lovastatin 40 daily Aspirin 81 mg 1 By mouth twice a day Lasix 40 one tab by mouth twice a day Metoprolol 25 mg half tab by mouth twice a day Lisinopril 40 daily Spiriva daily Albuterol MDI every 6 when necessary Albuterol nebulizers every 6 when necessary     PHYSICAL EXAMINATION:   VITAL SIGNS: Blood pressure 127/88, pulse 63, temperature 99.5 F (37.5 C), temperature source Oral, resp. rate (!) 26, height 6\' 1"  (1.854 m), weight 183 lb (83 kg), SpO2 99 %.  GENERAL:  76 y.o.-year-old patient lying in the bed with no acute distress.  EYES: Pupils equal, round, reactive to light and accommodation. No scleral icterus. Extraocular muscles intact.  HEENT: Head atraumatic, normocephalic. Oropharynx and nasopharynx clear.  NECK:  Supple, no jugular venous distention. No thyroid enlargement, no tenderness.  LUNGS: Bilateral wheezing throughout both lungs no rhonchi or crackles no sensory muscle usage CARDIOVASCULAR: S1, S2 normal. No murmurs, rubs, or gallops.  ABDOMEN: Soft, nontender, nondistended. Bowel sounds present. No organomegaly or mass.  EXTREMITIES: Chronic lower extremity swelling bilaterally  NEUROLOGIC: Cranial nerves II through XII are intact. Muscle strength 5/5 in all extremities. Sensation intact. Gait not checked.  PSYCHIATRIC: The patient is alert and oriented x 3.  SKIN: No obvious rash, lesion, or ulcer.   LABORATORY PANEL:   CBC  Recent Labs Lab 12/26/16 1838  WBC 4.8  HGB 13.8  HCT 43.2  PLT 155  MCV 87.4  MCH 28.0  MCHC 32.1  RDW 17.1*   ------------------------------------------------------------------------------------------------------------------  Chemistries   Recent Labs Lab 12/26/16 1838  NA 133*  K 4.2  CL 99*  CO2 27  GLUCOSE 255*  BUN 23*  CREATININE 1.65*  CALCIUM 9.5   ------------------------------------------------------------------------------------------------------------------ estimated creatinine clearance is 44.4 mL/min (by C-G formula based on SCr of 1.65 mg/dL (H)). ------------------------------------------------------------------------------------------------------------------ No results for input(s): TSH, T4TOTAL, T3FREE, THYROIDAB in the last 72 hours.  Invalid input(s):  FREET3   Coagulation profile No results for input(s): INR, PROTIME in the last 168 hours. ------------------------------------------------------------------------------------------------------------------- No results for input(s): DDIMER in the last 72 hours. -------------------------------------------------------------------------------------------------------------------  Cardiac Enzymes  Recent Labs Lab 12/26/16 1838  TROPONINI 0.11*   ------------------------------------------------------------------------------------------------------------------ Invalid input(s): POCBNP  ---------------------------------------------------------------------------------------------------------------  Urinalysis    Component Value Date/Time   COLORURINE YELLOW (A) 04/11/2015 1225   APPEARANCEUR CLEAR (A) 04/11/2015 1225   APPEARANCEUR Hazy 03/19/2014 2110   LABSPEC 1.005 04/11/2015 1225   LABSPEC 1.018 03/19/2014 2110   PHURINE 7.0 04/11/2015 1225   GLUCOSEU NEGATIVE 04/11/2015 1225   GLUCOSEU Negative 03/19/2014 2110   HGBUR 3+ (A) 04/11/2015 1225   BILIRUBINUR NEGATIVE 04/11/2015 1225   BILIRUBINUR Negative 03/19/2014 2110   KETONESUR NEGATIVE 04/11/2015 1225   PROTEINUR NEGATIVE 04/11/2015 1225   NITRITE NEGATIVE 04/11/2015 1225   LEUKOCYTESUR NEGATIVE 04/11/2015 1225   LEUKOCYTESUR Negative 03/19/2014 2110     RADIOLOGY: Dg Chest 2 View  Result Date: 12/26/2016 CLINICAL DATA:  Shortness of breath and chest pain EXAM: CHEST  2 VIEW COMPARISON:  03/10/2016, 10/31/2015 FINDINGS: Hyperinflation is present. No acute consolidation or effusion. Probable nipple shadow right lower lung. Moderate cardiomegaly without overt failure. Known right upper lobe pulmonary nodule is not well visualized today. No pneumothorax. Degenerative changes of the spine. IMPRESSION: 1. Hyperinflation without focal infiltrate 2. Stable cardiomegaly 3. Probable  nipple shadow in the right lower lung  Electronically Signed   By: Jasmine PangKim  Fujinaga M.D.   On: 12/26/2016 19:32   Ct Head Wo Contrast  Result Date: 12/26/2016 CLINICAL DATA:  Syncope. Head injury in shortness of breath. Chest pain. EXAM: CT HEAD WITHOUT CONTRAST TECHNIQUE: Contiguous axial images were obtained from the base of the skull through the vertex without intravenous contrast. 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 Periventricular white matter and corona radiata hypodensities favor chronic ischemic microvascular white matter disease. Otherwise, the brainstem, cerebellum, cerebral peduncles, thalami, basal ganglia, basilar cisterns, and ventricular system appear within normal limits. No intracranial hemorrhage, mass lesion, or acute CVA. Vascular: There is atherosclerotic calcification of the cavernous carotid arteries bilaterally. Skull: Unremarkable Sinuses/Orbits: Small metal fixators/devices along the medial orbits bilaterally, unchanged from prior. Minimal chronic ethmoid sinusitis. Other: No supplemental non-categorized findings. IMPRESSION: 1. No acute intracranial findings. 2. 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 Periventricular white matter and corona radiata hypodensities favor chronic ischemic microvascular white matter disease. Electronically Signed   By: Gaylyn RongWalter  Liebkemann M.D.   On: 12/26/2016 20:08    EKG: Orders placed or performed during the hospital encounter of 12/26/16  . ED EKG within 10 minutes  . ED EKG within 10 minutes  . EKG 12-Lead  . EKG 12-Lead    IMPRESSION AND PLAN: Patient is 76 year old with history of COPD presenting with shortness of breath cough congestion elevated troponin  1. Acute on chronic COPD exasperation We will treat with nebulizer therapy Solu-Medrol mucolytics and antibiotics.  2. Chest pressure with elevated troponin Continue aspirin will cycle cardiac enzymes ask his cardiologist to see  3.  Diabetes type 2 I will continue his oral glipizide placement sliding scale insulin  4. Essential hypertension continue lisinopril  5. Hyperlipidemia unspecified continue lovastatin  6. Miscellaneous Lovenox for DVT prophylaxis   All the records are reviewed and case discussed with ED provider. Management plans discussed with the patient, family and they are in agreement.  CODE STATUS: Code Status History    Date Active Date Inactive Code Status Order ID Comments User Context   10/31/2015  9:34 AM 11/03/2015  8:11 PM Full Code [id number]  Arnaldo NatalMichael S Diamond, MD Inpatient   09/16/2015  1:49 AM 09/16/2015  6:22 PM Full Code [id number]  Arnaldo NatalMichael S Diamond, MD ED   08/24/2015  5:42 PM 08/27/2015  4:10 PM Full Code [id number]  Auburn BilberryShreyang Coralyn Roselli, MD Inpatient   08/16/2015  3:55 AM 08/17/2015  8:22 PM Full Code [id number]  Oralia Manisavid Willis, MD Inpatient   07/04/2015  2:22 PM 07/08/2015  4:04 PM Full Code [id number]  Altamese DillingVaibhavkumar Vachhani, MD Inpatient       TOTAL TIME TAKING CARE OF THIS PATIENT: 55minutes.    Auburn BilberryPATEL, Terrilynn Postell M.D on 12/26/2016 at 8:55 PM  Between 7am to 6pm - Pager - [phone]  After 6pm go to [email or web address] - password [email or web address] [facility]  [facility]  Office  [phone]  CC: Primary care physician; [facility]

## 2016-12-26 NOTE — ED Notes (Signed)
Transporting patient to room 257-2A

## 2016-12-26 NOTE — ED Provider Notes (Signed)
[facility] Emergency Department Provider Note    None    (approximate)  I have reviewed the triage vital signs and the nursing notes.   HISTORY  Chief Complaint Chest Pain and Shortness of Breath    HPI Austin [name] is a 76 y.o. male with a history of extensive cardiac disease as well as A. fib and remote pulmonary embolism not on any blood thinners at this time presents with shortness of breath and chest pain after the patient was getting would further woodstove. States he has had a cough over the past 2 days. Wears supplemental oxygen as needed. Admits to a productive cough. No lower extremity swelling. No worsening orthopnea. States he is having midsternal pressure and pain that occurred after getting the wood. While getting back in to the house the patient had chest pain that worsened and the patient had a syncopal event where he fell and hit his left forehead.   Past Medical History:  Diagnosis Date  . Asthma   . Atrial fibrillation (HCC)   . CHF (congestive heart failure) (HCC)   . COPD (chronic obstructive pulmonary disease) (HCC)   . Diabetes mellitus without complication (HCC)   . DVT (deep venous thrombosis) (HCC)   . Gastric ulcer   . GERD (gastroesophageal reflux disease)   . Hypercholesteremia   . Hypertension   . PE (pulmonary embolism)    Family History  Problem Relation Age of Onset  . CAD Brother   . Congestive Heart Failure Brother    Past Surgical History:  Procedure Laterality Date  . HERNIA REPAIR    . PERIPHERAL VASCULAR CATHETERIZATION N/A 11/03/2015   Procedure: IVC Filter Insertion;  Surgeon: Annice NeedyJason S Dew, MD;  Location: [facility] INVASIVE CV LAB;  Service: Cardiovascular;  Laterality: N/A;  . PERIPHERAL VASCULAR CATHETERIZATION N/A 07/24/2016   Procedure: IVC Filter Removal;  Surgeon: Annice NeedyJason S Dew, MD;  Location: [facility] INVASIVE CV LAB;  Service: Cardiovascular;  Laterality: N/A;  . PROSTATE SURGERY     Patient Active  Problem List   Diagnosis Date Noted  . Hypoxia 11/03/2015  . COPD (chronic obstructive pulmonary disease) (HCC) 11/03/2015  . DVT of popliteal vein (HCC) 11/03/2015  . Chronic systolic CHF (congestive heart failure) (HCC) 11/03/2015  . Essential hypertension 11/03/2015  . Diabetes mellitus (HCC) 11/03/2015  . Continuous tobacco abuse 11/03/2015  . Chronic pulmonary embolism (HCC) 11/03/2015  . 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 Hemoptysis 10/31/2015  . Elevated troponin 09/16/2015  . SOB (shortness of breath) 08/24/2015  . Pulmonary embolism (HCC) 08/24/2015  . 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 Type 2 diabetes mellitus (HCC) 08/16/2015  . HLD (hyperlipidemia) 08/16/2015  . GERD (gastroesophageal reflux disease) 08/16/2015  . Chest pain 08/16/2015  . Constipation 08/16/2015  . HTN (hypertension) 07/21/2015  . COPD (chronic obstructive pulmonary disease) with chronic bronchitis (HCC) 07/21/2015  . CHF (NYHA class IV, ACC/AHA stage D) (HCC) 07/04/2015  . CHF (congestive heart failure) (HCC) 07/04/2015      Prior to Admission medications   Medication Sig Start Date End Date Taking? Authorizing Provider  albuterol (PROVENTIL) (2.5 MG/3ML) 0.083% nebulizer solution Take 3 mLs (2.5 mg total) by nebulization every 6 (six) hours as needed for wheezing or shortness of breath. 07/08/15   Alford Highlandichard Wieting, MD  albuterol-ipratropium (COMBIVENT) 18-103 MCG/ACT inhaler Inhale 2 puffs into the lungs 4 (four) times daily.    Historical Provider, MD  budesonide-formoterol (SYMBICORT) 160-4.5 MCG/ACT inhaler Inhale 2 puffs into the lungs 2 (two) times daily.    Historical Provider, MD  furosemide (LASIX) 40 MG tablet Take 1 tablet (40 mg total) by mouth 2 (two) times daily. 07/21/15   Delma Freeze, FNP  glipiZIDE (GLUCOTROL XL) 2.5 MG 24 hr tablet Take 2.5 mg by mouth daily with breakfast.    Historical Provider, MD    lisinopril (PRINIVIL,ZESTRIL) 40 MG tablet Take 40 mg by mouth daily.    Historical Provider, MD  lovastatin (MEVACOR) 40 MG tablet Take 40 mg by mouth daily with supper. Pt should take with food.    Historical Provider, MD  metoprolol tartrate (LOPRESSOR) 25 MG tablet Take 12.5 mg by mouth 2 (two) times daily.     Historical Provider, MD  nitroGLYCERIN (NITROSTAT) 0.4 MG SL tablet Place 0.4 mg under the tongue every 5 (five) minutes x 3 doses as needed for chest pain. If no relief call 911 or go to emergency room.    Historical Provider, MD  omeprazole (PRILOSEC) 40 MG capsule Take 40 mg by mouth daily.    Historical Provider, MD  polyethylene glycol (MIRALAX) packet Take 17 g by mouth daily as needed for moderate constipation. 08/17/15   Delfino Lovett, MD  tiotropium (SPIRIVA) 18 MCG inhalation capsule Place 1 capsule (18 mcg total) into inhaler and inhale daily. 07/08/15   Alford Highland, MD    Allergies Contrast media [iodinated diagnostic agents] and Other    Social History Social History  Substance Use Topics  . Smoking status: Current Every Day Smoker    Packs/day: 0.50    Years: 45.00    Types: Cigarettes    Last attempt to quit: 07/04/2015  . Smokeless tobacco: Never Used  . Alcohol use No    Review of Systems Patient denies headaches, rhinorrhea, blurry vision, numbness, shortness of breath, chest pain, edema, cough, abdominal pain, nausea, vomiting, diarrhea, dysuria, fevers, rashes or hallucinations unless otherwise stated above in HPI. ____________________________________________   PHYSICAL EXAM:  VITAL SIGNS: Vitals:   12/26/16 1822  BP: 119/71  Pulse: 77  Resp: 18  Temp: 99.5 F (37.5 C)    Constitutional: Alert and oriented. ill appearing but in no acute distress. Eyes: Conjunctivae are normal. PERRL. EOMI. Head: Atraumatic. Nose: No congestion/rhinnorhea. Mouth/Throat: Mucous membranes are moist.  Oropharynx non-erythematous. Neck: No stridor. Painless  ROM. No cervical spine tenderness to palpation Hematological/Lymphatic/Immunilogical: No cervical lymphadenopathy. Cardiovascular: Normal rate, regular rhythm. Grossly normal heart sounds.  Good peripheral circulation. Respiratory: mild tachypnea with boarse bronchial breath sounds and expiratroy wheezing Gastrointestinal: Soft and nontender. No distention. No abdominal bruits. No CVA tenderness. Musculoskeletal: No lower extremity tenderness nor edema.  No joint effusions. Neurologic:  Normal speech and language. No gross focal neurologic deficits are appreciated. No gait instability. Skin:  Skin is warm, dry and intact. No rash noted. Psychiatric: Mood and affect are normal. Speech and behavior are normal.  ____________________________________________   LABS (all labs ordered are listed, but only abnormal results are displayed)  Results for orders placed or performed during the hospital encounter of 12/26/16 (from the past 24 hour(s))  Basic metabolic panel     Status: Abnormal   Collection Time: 12/26/16  6:38 PM  Result Value Ref Range   Sodium 133 (L) 135 - 145 mmol/L   Potassium 4.2 3.5 - 5.1 mmol/L   Chloride 99 (L) 101 - 111 mmol/L   CO2 27 22 - 32 mmol/L   Glucose, Bld 255 (  H) 65 - 99 mg/dL   BUN 23 (H) 6 - 20 mg/dL   Creatinine, Ser 1.61 (H) 0.61 - 1.24 mg/dL   Calcium 9.5 8.9 - 09.6 mg/dL   GFR calc non Af Amer 39 (L) >60 mL/min   GFR calc Af Amer 46 (L) >60 mL/min   Anion gap 7 5 - 15  CBC     Status: Abnormal   Collection Time: 12/26/16  6:38 PM  Result Value Ref Range   WBC 4.8 3.8 - 10.6 K/uL   RBC 4.94 4.40 - 5.90 MIL/uL   Hemoglobin 13.8 13.0 - 18.0 g/dL   HCT 04.5 40.9 - 81.1 %   MCV 87.4 80.0 - 100.0 fL   MCH 28.0 26.0 - 34.0 pg   MCHC 32.1 32.0 - 36.0 g/dL   RDW 91.4 (H) 78.2 - 95.6 %   Platelets 155 150 - 440 K/uL  Troponin I     Status: Abnormal   Collection Time: 12/26/16  6:38 PM  Result Value Ref Range   Troponin I 0.11 (HH) <0.03 ng/mL    ____________________________________________  EKG My review and personal interpretation at Time: 18:42   Indication: chest pain  Rate: 70  Rhythm: sinus Axis: left Other: t wave inversion in inferior distribution with st depressions consistent with previous 10/27/15 ____________________________________________  RADIOLOGY  I personally reviewed all radiographic images ordered to evaluate for the above acute complaints and reviewed radiology reports and findings.  These findings were personally discussed with the patient.  Please see medical record for radiology report.  ____________________________________________   PROCEDURES  Procedure(s) performed:  Procedures    Critical Care performed: no ____________________________________________   INITIAL IMPRESSION / ASSESSMENT AND PLAN / ED COURSE  Pertinent labs & imaging results that were available during my care of the patient were reviewed by me and considered in my medical decision making (see chart for details).  DDX: ACS, pericarditis, esophagitis, boerhaaves, pe, dissection, pna, bronchitis, costochondritis   Austin [name] is a 76 y.o. who presents to the ED with syncopal event.  Patient is AFVSS in ED. Exam as above. Given current presentation have considered the above differential.  CT head ordered to evaluate for acute head trauma.  Ct with NAICA.  CXR with no consolidation.  Clinically patient appears to have some component of COPD exacerbation.  Given nebulizers and steroid.  EKG with ischemic changes but appears consistent with previous EKG and patient is pain free now.  Trop elevated to 0.11.  Probable NSTEMI.   Concern for dysrhythmic event causing syncope.  Do feel patient will require admission for further evaluation and management.  Spoke with Dr. Allena Katz who kindly agrees to admit patient to the hospital for further monitoring and management.  Have discussed with the patient and available family all diagnostics  and treatments performed thus far and all questions were answered to the best of my ability. The patient demonstrates understanding and agreement with plan.    Clinical Course      ____________________________________________   FINAL CLINICAL IMPRESSION(S) / ED DIAGNOSES  Final diagnoses:  Syncope and collapse  COPD exacerbation (HCC)  Elevated troponin I level      NEW MEDICATIONS STARTED DURING THIS VISIT:  New Prescriptions   No medications on file     Note:  This document was prepared using Dragon voice recognition software and may include unintentional dictation errors.    Willy Eddy, MD 12/26/16 2225

## 2016-12-27 DIAGNOSIS — J441 Chronic obstructive pulmonary disease with (acute) exacerbation: Secondary | ICD-10-CM | POA: Diagnosis not present

## 2016-12-27 LAB — INFLUENZA PANEL BY PCR (TYPE A & B)
Influenza A By PCR: POSITIVE — AB
Influenza B By PCR: NEGATIVE

## 2016-12-27 LAB — GLUCOSE, CAPILLARY
GLUCOSE-CAPILLARY: 193 mg/dL — AB (ref 65–99)
GLUCOSE-CAPILLARY: 299 mg/dL — AB (ref 65–99)
GLUCOSE-CAPILLARY: 299 mg/dL — AB (ref 65–99)
Glucose-Capillary: 177 mg/dL — ABNORMAL HIGH (ref 65–99)

## 2016-12-27 LAB — TROPONIN I
TROPONIN I: 0.09 ng/mL — AB (ref <0.03)
TROPONIN I: 0.1 ng/mL — AB (ref <0.03)
TROPONIN I: 0.1 ng/mL — AB (ref <0.03)

## 2016-12-27 MED ORDER — PREDNISONE 50 MG PO TABS: 50.0000 mg | ORAL_TABLET | Freq: Every day | ORAL | 0 refills | Status: DC

## 2016-12-27 MED ORDER — OSELTAMIVIR PHOSPHATE 75 MG PO CAPS
75.0000 mg | ORAL_CAPSULE | Freq: Two times a day (BID) | ORAL | Status: DC
  Administered 2016-12-27: 75 mg via ORAL
  Filled 2016-12-27: qty 1

## 2016-12-27 MED ORDER — POLYETHYLENE GLYCOL 3350 17 G PO PACK
17.0000 g | PACK | Freq: Once | ORAL | Status: AC
  Administered 2016-12-27: 17 g via ORAL
  Filled 2016-12-27: qty 1

## 2016-12-27 MED ORDER — OSELTAMIVIR PHOSPHATE 30 MG PO CAPS: 30.0000 mg | ORAL_CAPSULE | Freq: Two times a day (BID) | ORAL | Status: DC

## 2016-12-27 MED ORDER — IPRATROPIUM-ALBUTEROL 0.5-2.5 (3) MG/3ML IN SOLN: 3.0000 mL | Freq: Four times a day (QID) | RESPIRATORY_TRACT | Status: DC | PRN

## 2016-12-27 MED ORDER — OSELTAMIVIR PHOSPHATE 75 MG PO CAPS: 75.0000 mg | ORAL_CAPSULE | Freq: Two times a day (BID) | ORAL | 0 refills | Status: DC

## 2016-12-27 NOTE — Progress Notes (Signed)
Pt arrived from ED alert and oriented. Telemetry and skin verified with Lexi RN . No c/o pain. VS stable. Pt flu swab returned positive for influenza A. Droplet precautions initiated. Pt verbalized that flu symptoms started Saturday. MD aware.

## 2016-12-27 NOTE — Progress Notes (Signed)
Pt. States "I been having black stools but I havent told anyone the last time I went was a couple days ago" Dr. Elpidio AnisSudini notified pt. hgb 13.8 orders for Miralax and to follow up with PCP

## 2016-12-27 NOTE — Care Management Important Message (Signed)
Important Message  Patient Details  Name: Austin [name] MRN: [id number] Date of Birth: [birth year]   Medicare Important Message Given:  Yes Initial signed IM printed from Epic and given to patient.   Eber HongGreene, Tivon Lemoine R, RN 12/27/2016, 1:44 PM

## 2016-12-27 NOTE — Consult Note (Signed)
[facility]  CARDIOLOGY CONSULT NOTE  Patient ID: Austin MccreedyJames Allen [name] MRN: [id number] DOB/AGE: August 19, 1941 76 y.o.  Admit date: 12/26/2016 Referring Physician Sud[facility] Primary Physician [facility] Reason for Consultation Chest pain  HPI: 76 year old gentleman referred for evaluation of chest pain. The patient has known history of atrial fibrillation. 2-D echocardiogram 03/31/2015 revealed normal left ventricular function. Lexiscan Cardiolite study 04/01/2015 revealed no evidence for scar or ischemia. The patient is admitted at this time with chief complaint of shortness of breath. He also complains of chest pain, converted to recurrent, productive cough. Admission labs were notable for troponin 0.11 and 0.10. ECG was nondiagnostic. The patient is positive for influenza A.  Review of systems complete and found to be negative unless listed above     Past Medical History:  Diagnosis Date  . Asthma   . Atrial fibrillation (HCC)   . CHF (congestive heart failure) (HCC)   . COPD (chronic obstructive pulmonary disease) (HCC)   . Diabetes mellitus without complication (HCC)   . DVT (deep venous thrombosis) (HCC)   . Gastric ulcer   . GERD (gastroesophageal reflux disease)   . Hypercholesteremia   . Hypertension   . PE (pulmonary embolism)     Past Surgical History:  Procedure Laterality Date  . HERNIA REPAIR    . PERIPHERAL VASCULAR CATHETERIZATION N/A 11/03/2015   Procedure: IVC Filter Insertion;  Surgeon: Annice NeedyJason S Dew, MD;  Location: [facility] INVASIVE CV LAB;  Service: Cardiovascular;  Laterality: N/A;  . PERIPHERAL VASCULAR CATHETERIZATION N/A 07/24/2016   Procedure: IVC Filter Removal;  Surgeon: Annice NeedyJason S Dew, MD;  Location: [facility] INVASIVE CV LAB;  Service: Cardiovascular;  Laterality: N/A;  . PROSTATE SURGERY      Prescriptions Prior to Admission  Medication Sig Dispense Refill Last Dose  . albuterol (PROVENTIL) (2.5 MG/3ML) 0.083% nebulizer solution Take  3 mLs (2.5 mg total) by nebulization every 6 (six) hours as needed for wheezing or shortness of breath. 75 mL 12 unknown at unknown  . furosemide (LASIX) 40 MG tablet Take 1 tablet (40 mg total) by mouth 2 (two) times daily. 60 tablet 5 unknown at unknown  . glipiZIDE (GLUCOTROL) 5 MG tablet Take 5 mg by mouth 2 (two) times daily.   unknown at unknown  . lisinopril (PRINIVIL,ZESTRIL) 40 MG tablet Take 40 mg by mouth daily.   unknown at unknown  . lovastatin (MEVACOR) 40 MG tablet Take 40 mg by mouth at bedtime. Pt should take with food.    unknown at unknown  . metoprolol tartrate (LOPRESSOR) 25 MG tablet Take 12.5 mg by mouth 2 (two) times daily.    unknown at unknown  . omeprazole (PRILOSEC) 40 MG capsule Take 40 mg by mouth daily.   unknown at unknown  . polyethylene glycol (MIRALAX) packet Take 17 g by mouth daily as needed for moderate constipation. 30 each 0 unknown at unknown  . Tiotropium Bromide Monohydrate (SPIRIVA RESPIMAT) 2.5 MCG/ACT AERS Inhale 2 puffs into the lungs daily.   unknown at unknown  . albuterol-ipratropium (COMBIVENT) 18-103 MCG/ACT inhaler Inhale 2 puffs into the lungs 2 (two) times daily as needed.    Not Taking  . budesonide-formoterol (SYMBICORT) 160-4.5 MCG/ACT inhaler Inhale 2 puffs into the lungs 2 (two) times daily.   Not Taking at Unknown time  . fluticasone furoate-vilanterol (BREO ELLIPTA) 100-25 MCG/INH AEPB Inhale 1 puff into the lungs daily.   Not Taking at Unknown time  . nitroGLYCERIN (NITROSTAT) 0.4 MG SL tablet Place 0.4  mg under the tongue every 5 (five) minutes x 3 doses as needed for chest pain. If no relief call 911 or go to emergency room.   prn at prn   Social History   Social History  . Marital status: Married    Spouse name: N/A  . Number of children: N/A  . Years of education: N/A   Occupational History  . Not on file.   Social History Main Topics  . Smoking status: Current Every Day Smoker    Packs/day: 0.50    Years: 45.00    Types:  Cigarettes    Last attempt to quit: 07/04/2015  . Smokeless tobacco: Never Used  . Alcohol use No  . Drug use: No  . Sexual activity: Not on file   Other Topics Concern  . Not on file   Social History Narrative  . No narrative on file    Family History  Problem Relation Age of Onset  . CAD Brother   . Congestive Heart Failure Brother       Review of systems complete and found to be negative unless listed above      PHYSICAL EXAM  General: Well developed, well nourished, in no acute distress HEENT:  Normocephalic and atramatic Neck:  No JVD.  Lungs: Clear bilaterally to auscultation and percussion. Heart: HRRR . Normal S1 and S2 without gallops or murmurs.  Abdomen: Bowel sounds are positive, abdomen soft and non-tender  Msk:  Back normal, normal gait. Normal strength and tone for age. Extremities: No clubbing, cyanosis or edema.   Neuro: Alert and oriented X 3. Psych:  Good affect, responds appropriately  Labs:   Lab Results  Component Value Date   WBC 4.8 12/26/2016   HGB 13.8 12/26/2016   HCT 43.2 12/26/2016   MCV 87.4 12/26/2016   PLT 155 12/26/2016    Recent Labs Lab 12/26/16 1838  NA 133*  K 4.2  CL 99*  CO2 27  BUN 23*  CREATININE 1.65*  CALCIUM 9.5  GLUCOSE 255*   Lab Results  Component Value Date   CKMB 2.2 03/20/2014   TROPONINI 0.09 (HH) 12/27/2016    Lab Results  Component Value Date   CHOL 130 03/23/2014   Lab Results  Component Value Date   HDL 34 (L) 03/23/2014   Lab Results  Component Value Date   LDLCALC 74 03/23/2014   Lab Results  Component Value Date   TRIG 111 03/23/2014   No results found for: CHOLHDL No results found for: LDLDIRECT    Radiology: Dg Chest 2 View  Result Date: 12/26/2016 CLINICAL DATA:  Shortness of breath and chest pain EXAM: CHEST  2 VIEW COMPARISON:  03/10/2016, 10/31/2015 FINDINGS: Hyperinflation is present. No acute consolidation or effusion. Probable nipple shadow right lower lung. Moderate  cardiomegaly without overt failure. Known right upper lobe pulmonary nodule is not well visualized today. No pneumothorax. Degenerative changes of the spine. IMPRESSION: 1. Hyperinflation without focal infiltrate 2. Stable cardiomegaly 3. Probable nipple shadow in the right lower lung Electronically Signed   By: Jasmine Pang M.D.   On: 12/26/2016 19:32   Ct Head Wo Contrast  Result Date: 12/26/2016 CLINICAL DATA:  Syncope. Head injury in shortness of breath. Chest pain. EXAM: CT HEAD WITHOUT CONTRAST TECHNIQUE: Contiguous axial images were obtained from the base of the skull through the vertex without intravenous contrast. 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 Periventricular white matter and corona radiata  hypodensities favor chronic ischemic microvascular white matter disease. Otherwise, the brainstem, cerebellum, cerebral peduncles, thalami, basal ganglia, basilar cisterns, and ventricular system appear within normal limits. No intracranial hemorrhage, mass lesion, or acute CVA. Vascular: There is atherosclerotic calcification of the cavernous carotid arteries bilaterally. Skull: Unremarkable Sinuses/Orbits: Small metal fixators/devices along the medial orbits bilaterally, unchanged from prior. Minimal chronic ethmoid sinusitis. Other: No supplemental non-categorized findings. IMPRESSION: 1. No acute intracranial findings. 2. 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 Periventricular white matter and corona radiata hypodensities favor chronic ischemic microvascular white matter disease. Electronically Signed   By: Gaylyn Rong M.D.   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 Chest pain, atypical, secondary to cough, nondiagnostic ECG, borderline elevated troponin without peak or trough 2. COPD  exacerbation with influenza A  Recommendations  1. Agree with current therapy 2. Defer full dose anticoagulation 3. Defer cardiac diagnostics at this time  Signed off for now, please call if any questions  Signed: Marcina Millard MD,PhD, [facility] 12/27/2016, 10:06 AM

## 2016-12-27 NOTE — Discharge Instructions (Signed)
Activity and diet as tolerated.    

## 2016-12-27 NOTE — Care Management (Addendum)
patient presents from home with shortness of breath.  He uses nocturnal oxygen only  supplied by [employer].  Patient is positive for influenza. Lives with his wife and independent in his adls.  Informed CM that he is experiencing significant nausea and that he has been having dark stools but had not told the physician.   A CM informed primary nurse of gi sx

## 2016-12-28 NOTE — Progress Notes (Signed)
Patient discharged home ,alert and oriented,hemodynamically stable,escorted by staff member and family via wheel chair

## 2017-01-02 NOTE — Discharge Summary (Signed)
[facility] - [place] at [facility]   PATIENT NAME: Austin MessingJames Kreitzer    MR#:  [id number]  DATE OF BIRTH:  [birth year]  DATE OF ADMISSION:  12/26/2016 ADMITTING PHYSICIAN: Auburn BilberryShreyang Patel, MD  DATE OF DISCHARGE: 12/27/2016  5:29 PM  PRIMARY CARE PHYSICIAN: [facility]   ADMISSION DIAGNOSIS:  Syncope and collapse [R55] COPD exacerbation (HCC) [J44.1] Elevated troponin I level [R74.8]  DISCHARGE DIAGNOSIS:  Active Problems:   COPD exacerbation (HCC)   SECONDARY DIAGNOSIS:   Past Medical History:  Diagnosis Date  . Asthma   . Atrial fibrillation (HCC)   . CHF (congestive heart failure) (HCC)   . COPD (chronic obstructive pulmonary disease) (HCC)   . Diabetes mellitus without complication (HCC)   . DVT (deep venous thrombosis) (HCC)   . Gastric ulcer   . GERD (gastroesophageal reflux disease)   . Hypercholesteremia   . Hypertension   . PE (pulmonary embolism)      ADMITTING HISTORY   HISTORY OF PRESENT ILLNESS: Austin [name]  is a 76 y.o. male with a known history of  Asthma, COPD, atrial fibrillation, diabetes type 2, history of DVT, GERD, hypercholesterolemia, essential hypertension as well as pulmonary embolism we uses oxygen at nighttime and when necessary oxygen during the day. Who is presenting to the emergency room complaining of shortness of breath and chest pain. Patient has had a cough ongoing for the past few days associated with shortness of breath he also has had some wheezing. Patient today was trying to get some log for the fireplace when he came back inside the house and had a episode where he fell forward therefore was brought to the hospital. He also has been having complaint of tightness in his chest. Currently he has no chest pain or tightness. Denies any nausea vomiting has had upper respiratory symptoms and nasal congestion.  HOSPITAL COURSE:   * Acute bronchitis with influenza a Patient was treated with Tamiflu,  steroids, breathing treatments. Did not need any oxygen by time of discharge. No wheezing on examination. Patient felt back to baseline with his breathing. Had mild pleuritic chest pain for which he was seen by cardiology. His mild elevation troponin was thought to be due to demand ischemia. No further investigations ordered. Patient will be discharged home on Tamiflu, oral prednisone and inhalers.  Stable for discharge home.  CONSULTS OBTAINED:  Treatment Team:  Marcina MillardAlexander Paraschos, MD  DRUG ALLERGIES:   Allergies  Allergen Reactions  . Contrast Media [Iodinated Diagnostic Agents] Nausea And Vomiting  . Other Other (See Comments)    Cannot have "high doses of anesthesia because of his lungs"    DISCHARGE MEDICATIONS:   Discharge Medication List as of 12/27/2016  4:49 PM    START taking these medications   Details  oseltamivir (TAMIFLU) 75 MG capsule Take 1 capsule (75 mg total) by mouth 2 (two) times daily., Starting Wed 12/27/2016, Normal    predniSONE (DELTASONE) 50 MG tablet Take 1 tablet (50 mg total) by mouth daily with breakfast., Starting Wed 12/27/2016, Normal      CONTINUE these medications which have NOT CHANGED   Details  albuterol (PROVENTIL) (2.5 MG/3ML) 0.083% nebulizer solution Take 3 mLs (2.5 mg total) by nebulization every 6 (six) hours as needed for wheezing or shortness of breath., Starting Thu 07/08/2015, Normal    furosemide (LASIX) 40 MG tablet Take 1 tablet (40 mg total) by mouth 2 (two) times daily., Starting Wed 07/21/2015, Normal  glipiZIDE (GLUCOTROL) 5 MG tablet Take 5 mg by mouth 2 (two) times daily., Historical Med    lisinopril (PRINIVIL,ZESTRIL) 40 MG tablet Take 40 mg by mouth daily., Historical Med    lovastatin (MEVACOR) 40 MG tablet Take 40 mg by mouth at bedtime. Pt should take with food. , Historical Med    metoprolol tartrate (LOPRESSOR) 25 MG tablet Take 12.5 mg by mouth 2 (two) times daily. , Historical Med    omeprazole (PRILOSEC) 40  MG capsule Take 40 mg by mouth daily., Historical Med    polyethylene glycol (MIRALAX) packet Take 17 g by mouth daily as needed for moderate constipation., Starting Tue 08/17/2015, Normal    Tiotropium Bromide Monohydrate (SPIRIVA RESPIMAT) 2.5 MCG/ACT AERS Inhale 2 puffs into the lungs daily., Historical Med    albuterol-ipratropium (COMBIVENT) 18-103 MCG/ACT inhaler Inhale 2 puffs into the lungs 2 (two) times daily as needed. , Historical Med    budesonide-formoterol (SYMBICORT) 160-4.5 MCG/ACT inhaler Inhale 2 puffs into the lungs 2 (two) times daily., Historical Med    fluticasone furoate-vilanterol (BREO ELLIPTA) 100-25 MCG/INH AEPB Inhale 1 puff into the lungs daily., Historical Med    nitroGLYCERIN (NITROSTAT) 0.4 MG SL tablet Place 0.4 mg under the tongue every 5 (five) minutes x 3 doses as needed for chest pain. If no relief call 911 or go to emergency room., Historical Med      STOP taking these medications     glipiZIDE (GLUCOTROL XL) 2.5 MG 24 hr tablet      tiotropium (SPIRIVA) 18 MCG inhalation capsule         Today   VITAL SIGNS:  Blood pressure 117/65, pulse (!) 52, temperature 97.6 F (36.4 C), temperature source Oral, resp. rate 16, height 6\' 1"  (1.854 m), weight 84.9 kg (187 lb 1.6 oz), SpO2 95 %.  I/O:  No intake or output data in the 24 hours ending 01/02/17 1353  PHYSICAL EXAMINATION:  Physical Exam  GENERAL:  76 y.o.-year-old patient lying in the bed with no acute distress.  LUNGS: Normal breath sounds bilaterally, no wheezing, rales,rhonchi or crepitation. No use of accessory muscles of respiration.  CARDIOVASCULAR: S1, S2 normal. No murmurs, rubs, or gallops.  ABDOMEN: Soft, non-tender, non-distended. Bowel sounds present. No organomegaly or mass.  NEUROLOGIC: Moves all 4 extremities. PSYCHIATRIC: The patient is alert and oriented x 3.  SKIN: No obvious rash, lesion, or ulcer.   DATA REVIEW:   CBC  Recent Labs Lab 12/26/16 1838  WBC 4.8  HGB  13.8  HCT 43.2  PLT 155    Chemistries   Recent Labs Lab 12/26/16 1838  NA 133*  K 4.2  CL 99*  CO2 27  GLUCOSE 255*  BUN 23*  CREATININE 1.65*  CALCIUM 9.5    Cardiac Enzymes  Recent Labs Lab 12/27/16 1239  TROPONINI 0.10*    Microbiology Results  Results for orders placed or performed during the hospital encounter of 10/31/15  MRSA PCR Screening     Status: None   Collection Time: 10/31/15  9:25 AM  Result Value Ref Range Status   MRSA by PCR NEGATIVE NEGATIVE Final    Comment:        The GeneXpert MRSA Assay (FDA approved for NASAL specimens only), is one component of a comprehensive MRSA colonization surveillance program. It is not intended to diagnose MRSA infection nor to guide or monitor treatment for MRSA infections.     RADIOLOGY:  No results found.  Follow up with PCP in  1 week.  Management plans discussed with the patient, family and they are in agreement.  CODE STATUS:  Code Status History    Date Active Date Inactive Code Status Order ID Comments User Context   12/26/2016 11:05 PM 12/27/2016  8:34 PM Full Code [id number]  Auburn Bilberry, MD Inpatient   10/31/2015  9:34 AM 11/03/2015  8:11 PM Full Code [id number]  Arnaldo Natal, MD Inpatient   09/16/2015  1:49 AM 09/16/2015  6:22 PM Full Code [id number]  Arnaldo Natal, MD ED   08/24/2015  5:42 PM 08/27/2015  4:10 PM Full Code [id number]  Auburn Bilberry, MD Inpatient   08/16/2015  3:55 AM 08/17/2015  8:22 PM Full Code [id number]  Oralia Manis, MD Inpatient   07/04/2015  2:22 PM 07/08/2015  4:04 PM Full Code [id number]  Altamese Dilling, MD Inpatient      TOTAL TIME TAKING CARE OF THIS PATIENT ON DAY OF DISCHARGE: more than 30 minutes.   Milagros Loll R M.D on 01/02/2017 at 1:53 PM  Between 7am to 6pm - Pager - [phone]  After 6pm go to [email or web address] - password [email or web address] [facility]  [facility]  Office  [phone]  CC: Primary care physician; [facility]  Note: This dictation was prepared with Dragon dictation along with smaller phrase technology. Any transcriptional errors that result from this process are unintentional.

## 2017-07-13 ENCOUNTER — Emergency Department (HOSPITAL_COMMUNITY): Payer: Medicare Other

## 2017-07-13 ENCOUNTER — Encounter (HOSPITAL_COMMUNITY): Payer: Self-pay | Admitting: Emergency Medicine

## 2017-07-13 ENCOUNTER — Inpatient Hospital Stay (HOSPITAL_COMMUNITY)
Admission: EM | Admit: 2017-07-13 | Discharge: 2017-07-21 | DRG: 871 | Disposition: A | Discharge status: Home Health | Payer: Medicare Other | Attending: Internal Medicine | Admitting: Internal Medicine

## 2017-07-13 DIAGNOSIS — Z7982 Long term (current) use of aspirin: Secondary | ICD-10-CM

## 2017-07-13 DIAGNOSIS — I2729 Other secondary pulmonary hypertension: Secondary | ICD-10-CM | POA: Diagnosis present

## 2017-07-13 DIAGNOSIS — I5031 Acute diastolic (congestive) heart failure: Secondary | ICD-10-CM

## 2017-07-13 DIAGNOSIS — I10 Essential (primary) hypertension: Secondary | ICD-10-CM | POA: Diagnosis not present

## 2017-07-13 DIAGNOSIS — E872 Acidosis, unspecified: Secondary | ICD-10-CM | POA: Diagnosis present

## 2017-07-13 DIAGNOSIS — I2782 Chronic pulmonary embolism: Secondary | ICD-10-CM | POA: Diagnosis present

## 2017-07-13 DIAGNOSIS — Y92096 Garden or yard of other non-institutional residence as the place of occurrence of the external cause: Secondary | ICD-10-CM | POA: Diagnosis not present

## 2017-07-13 DIAGNOSIS — I4510 Unspecified right bundle-branch block: Secondary | ICD-10-CM | POA: Diagnosis present

## 2017-07-13 DIAGNOSIS — I272 Pulmonary hypertension, unspecified: Secondary | ICD-10-CM | POA: Diagnosis not present

## 2017-07-13 DIAGNOSIS — K279 Peptic ulcer, site unspecified, unspecified as acute or chronic, without hemorrhage or perforation: Secondary | ICD-10-CM | POA: Diagnosis present

## 2017-07-13 DIAGNOSIS — I482 Chronic atrial fibrillation, unspecified: Secondary | ICD-10-CM | POA: Diagnosis present

## 2017-07-13 DIAGNOSIS — E785 Hyperlipidemia, unspecified: Secondary | ICD-10-CM | POA: Diagnosis present

## 2017-07-13 DIAGNOSIS — I361 Nonrheumatic tricuspid (valve) insufficiency: Secondary | ICD-10-CM | POA: Diagnosis not present

## 2017-07-13 DIAGNOSIS — I7121 Aneurysm of the ascending aorta, without rupture: Secondary | ICD-10-CM | POA: Diagnosis present

## 2017-07-13 DIAGNOSIS — E119 Type 2 diabetes mellitus without complications: Secondary | ICD-10-CM

## 2017-07-13 DIAGNOSIS — R918 Other nonspecific abnormal finding of lung field: Secondary | ICD-10-CM | POA: Diagnosis present

## 2017-07-13 DIAGNOSIS — R17 Unspecified jaundice: Secondary | ICD-10-CM | POA: Diagnosis present

## 2017-07-13 DIAGNOSIS — N182 Chronic kidney disease, stage 2 (mild): Secondary | ICD-10-CM | POA: Diagnosis present

## 2017-07-13 DIAGNOSIS — I130 Hypertensive heart and chronic kidney disease with heart failure and stage 1 through stage 4 chronic kidney disease, or unspecified chronic kidney disease: Secondary | ICD-10-CM | POA: Diagnosis present

## 2017-07-13 DIAGNOSIS — I50812 Chronic right heart failure: Secondary | ICD-10-CM | POA: Diagnosis present

## 2017-07-13 DIAGNOSIS — N390 Urinary tract infection, site not specified: Secondary | ICD-10-CM | POA: Clinically undetermined

## 2017-07-13 DIAGNOSIS — I5033 Acute on chronic diastolic (congestive) heart failure: Secondary | ICD-10-CM | POA: Diagnosis present

## 2017-07-13 DIAGNOSIS — Z91041 Radiographic dye allergy status: Secondary | ICD-10-CM

## 2017-07-13 DIAGNOSIS — J9601 Acute respiratory failure with hypoxia: Secondary | ICD-10-CM

## 2017-07-13 DIAGNOSIS — T380X5A Adverse effect of glucocorticoids and synthetic analogues, initial encounter: Secondary | ICD-10-CM | POA: Diagnosis present

## 2017-07-13 DIAGNOSIS — N200 Calculus of kidney: Secondary | ICD-10-CM

## 2017-07-13 DIAGNOSIS — W19XXXA Unspecified fall, initial encounter: Secondary | ICD-10-CM

## 2017-07-13 DIAGNOSIS — I712 Thoracic aortic aneurysm, without rupture: Secondary | ICD-10-CM | POA: Diagnosis present

## 2017-07-13 DIAGNOSIS — J9621 Acute and chronic respiratory failure with hypoxia: Secondary | ICD-10-CM | POA: Diagnosis present

## 2017-07-13 DIAGNOSIS — Z862 Personal history of diseases of the blood and blood-forming organs and certain disorders involving the immune mechanism: Secondary | ICD-10-CM

## 2017-07-13 DIAGNOSIS — J441 Chronic obstructive pulmonary disease with (acute) exacerbation: Secondary | ICD-10-CM | POA: Diagnosis present

## 2017-07-13 DIAGNOSIS — R6520 Severe sepsis without septic shock: Secondary | ICD-10-CM

## 2017-07-13 DIAGNOSIS — J9600 Acute respiratory failure, unspecified whether with hypoxia or hypercapnia: Secondary | ICD-10-CM

## 2017-07-13 DIAGNOSIS — K219 Gastro-esophageal reflux disease without esophagitis: Secondary | ICD-10-CM | POA: Diagnosis present

## 2017-07-13 DIAGNOSIS — Z9981 Dependence on supplemental oxygen: Secondary | ICD-10-CM | POA: Diagnosis not present

## 2017-07-13 DIAGNOSIS — Z7984 Long term (current) use of oral hypoglycemic drugs: Secondary | ICD-10-CM

## 2017-07-13 DIAGNOSIS — E1122 Type 2 diabetes mellitus with diabetic chronic kidney disease: Secondary | ICD-10-CM | POA: Diagnosis present

## 2017-07-13 DIAGNOSIS — I5082 Biventricular heart failure: Secondary | ICD-10-CM | POA: Diagnosis present

## 2017-07-13 DIAGNOSIS — N132 Hydronephrosis with renal and ureteral calculous obstruction: Secondary | ICD-10-CM | POA: Diagnosis present

## 2017-07-13 DIAGNOSIS — D696 Thrombocytopenia, unspecified: Secondary | ICD-10-CM | POA: Diagnosis present

## 2017-07-13 DIAGNOSIS — Z7951 Long term (current) use of inhaled steroids: Secondary | ICD-10-CM

## 2017-07-13 DIAGNOSIS — A419 Sepsis, unspecified organism: Principal | ICD-10-CM | POA: Diagnosis present

## 2017-07-13 DIAGNOSIS — R748 Abnormal levels of other serum enzymes: Secondary | ICD-10-CM | POA: Diagnosis present

## 2017-07-13 DIAGNOSIS — Z79899 Other long term (current) drug therapy: Secondary | ICD-10-CM

## 2017-07-13 DIAGNOSIS — F17210 Nicotine dependence, cigarettes, uncomplicated: Secondary | ICD-10-CM | POA: Diagnosis present

## 2017-07-13 DIAGNOSIS — I2510 Atherosclerotic heart disease of native coronary artery without angina pectoris: Secondary | ICD-10-CM | POA: Diagnosis present

## 2017-07-13 DIAGNOSIS — R0902 Hypoxemia: Secondary | ICD-10-CM

## 2017-07-13 DIAGNOSIS — Z86718 Personal history of other venous thrombosis and embolism: Secondary | ICD-10-CM

## 2017-07-13 DIAGNOSIS — Z884 Allergy status to anesthetic agent status: Secondary | ICD-10-CM

## 2017-07-13 DIAGNOSIS — W010XXA Fall on same level from slipping, tripping and stumbling without subsequent striking against object, initial encounter: Secondary | ICD-10-CM | POA: Diagnosis present

## 2017-07-13 HISTORY — DX: Chronic right heart failure: I50.812

## 2017-07-13 HISTORY — DX: Chronic atrial fibrillation, unspecified: I48.20

## 2017-07-13 HISTORY — DX: Severe sepsis without septic shock: R65.20

## 2017-07-13 HISTORY — DX: Sepsis, unspecified organism: A41.9

## 2017-07-13 HISTORY — DX: Acute respiratory failure, unspecified whether with hypoxia or hypercapnia: J96.00

## 2017-07-13 HISTORY — DX: Urinary tract infection, site not specified: N39.0

## 2017-07-13 HISTORY — DX: Pulmonary hypertension, unspecified: I27.20

## 2017-07-13 LAB — COMPREHENSIVE METABOLIC PANEL
ALBUMIN: 3.6 g/dL (ref 3.5–5.0)
ALT: 15 U/L — ABNORMAL LOW (ref 17–63)
ANION GAP: 18 — AB (ref 5–15)
AST: 46 U/L — AB (ref 15–41)
Alkaline Phosphatase: 76 U/L (ref 38–126)
BUN: 33 mg/dL — ABNORMAL HIGH (ref 6–20)
CHLORIDE: 103 mmol/L (ref 101–111)
CO2: 16 mmol/L — ABNORMAL LOW (ref 22–32)
Calcium: 9.7 mg/dL (ref 8.9–10.3)
Creatinine, Ser: 1.9 mg/dL — ABNORMAL HIGH (ref 0.61–1.24)
GFR calc Af Amer: 38 mL/min — ABNORMAL LOW (ref >60)
GFR calc non Af Amer: 33 mL/min — ABNORMAL LOW (ref >60)
GLUCOSE: 130 mg/dL — AB (ref 65–99)
POTASSIUM: 4.8 mmol/L (ref 3.5–5.1)
SODIUM: 137 mmol/L (ref 135–145)
Total Bilirubin: 2.1 mg/dL — ABNORMAL HIGH (ref 0.3–1.2)
Total Protein: 6.8 g/dL (ref 6.5–8.1)

## 2017-07-13 LAB — CBC WITH DIFFERENTIAL/PLATELET
BASOS ABS: 0 10*3/uL (ref 0.0–0.1)
BASOS PCT: 0 %
Eosinophils Absolute: 0 10*3/uL (ref 0.0–0.7)
Eosinophils Relative: 0 %
HCT: 49.5 % (ref 39.0–52.0)
Hemoglobin: 15.4 g/dL (ref 13.0–17.0)
LYMPHS ABS: 1 10*3/uL (ref 0.7–4.0)
LYMPHS PCT: 5 %
MCH: 29.6 pg (ref 26.0–34.0)
MCHC: 31.1 g/dL (ref 30.0–36.0)
MCV: 95.2 fL (ref 78.0–100.0)
MONO ABS: 1.4 10*3/uL — AB (ref 0.1–1.0)
MONOS PCT: 7 %
NEUTROS ABS: 18.8 10*3/uL — AB (ref 1.7–7.7)
Neutrophils Relative %: 89 %
PLATELETS: 159 10*3/uL (ref 150–400)
RBC: 5.2 MIL/uL (ref 4.22–5.81)
RDW: 17.7 % — AB (ref 11.5–15.5)
WBC: 21.2 10*3/uL — ABNORMAL HIGH (ref 4.0–10.5)

## 2017-07-13 LAB — LACTIC ACID, PLASMA
LACTIC ACID, VENOUS: 0.9 mmol/L (ref 0.5–1.9)
Lactic Acid, Venous: 7.5 mmol/L (ref 0.5–1.9)

## 2017-07-13 LAB — BRAIN NATRIURETIC PEPTIDE: B Natriuretic Peptide: 1769 pg/mL — ABNORMAL HIGH (ref 0.0–100.0)

## 2017-07-13 MED ORDER — PIPERACILLIN-TAZOBACTAM 3.375 G IVPB
3.3750 g | Freq: Three times a day (TID) | INTRAVENOUS | Status: DC
  Administered 2017-07-14 – 2017-07-18 (×13): 3.375 g via INTRAVENOUS
  Filled 2017-07-13 (×13): qty 50

## 2017-07-13 MED ORDER — VANCOMYCIN HCL IN DEXTROSE 1-5 GM/200ML-% IV SOLN
1000.0000 mg | Freq: Once | INTRAVENOUS | Status: AC
  Administered 2017-07-13: 1000 mg via INTRAVENOUS
  Filled 2017-07-13: qty 200

## 2017-07-13 MED ORDER — SODIUM CHLORIDE 0.9 % IV SOLN
INTRAVENOUS | Status: DC
  Administered 2017-07-13 – 2017-07-15 (×3): via INTRAVENOUS
  Administered 2017-07-15: 1 mL via INTRAVENOUS

## 2017-07-13 MED ORDER — VANCOMYCIN HCL 10 G IV SOLR
1250.0000 mg | INTRAVENOUS | Status: DC
  Administered 2017-07-14 – 2017-07-15 (×2): 1250 mg via INTRAVENOUS
  Filled 2017-07-13 (×3): qty 1250

## 2017-07-13 MED ORDER — METHYLPREDNISOLONE SODIUM SUCC 125 MG IJ SOLR
125.0000 mg | Freq: Once | INTRAMUSCULAR | Status: AC
  Administered 2017-07-13: 125 mg via INTRAVENOUS
  Filled 2017-07-13: qty 2

## 2017-07-13 MED ORDER — VANCOMYCIN HCL IN DEXTROSE 750-5 MG/150ML-% IV SOLN
750.0000 mg | Freq: Once | INTRAVENOUS | Status: DC
  Filled 2017-07-13: qty 150

## 2017-07-13 MED ORDER — PIPERACILLIN-TAZOBACTAM 3.375 G IVPB 30 MIN
3.3750 g | Freq: Once | INTRAVENOUS | Status: AC
  Administered 2017-07-13: 3.375 g via INTRAVENOUS
  Filled 2017-07-13: qty 50

## 2017-07-13 MED ORDER — SODIUM CHLORIDE 0.9 % IV BOLUS (SEPSIS)
1000.0000 mL | Freq: Once | INTRAVENOUS | Status: AC
  Administered 2017-07-13: 1000 mL via INTRAVENOUS

## 2017-07-13 MED ORDER — SODIUM CHLORIDE 0.9 % IV BOLUS (SEPSIS): 1000.0000 mL | Freq: Once | INTRAVENOUS | Status: DC

## 2017-07-13 NOTE — ED Notes (Signed)
Report to Lukasz, RN ICU 

## 2017-07-13 NOTE — Progress Notes (Signed)
Pharmacy Antibiotic Note  Austin [name] is a 76 y.o. male admitted on 07/13/2017 with sepsis.  Pharmacy has been consulted for vancomycin and zosyn dosing.Zosyn and vanc ordered as one time doses in the ED  Plan: Give an additional vanc 750 mg for total loading dose of 1750 mg then Vancomycin 1250 IV every 24 hours.  Goal trough 15-20 mcg/mL.  Zosyn 3.375 gm IV q8 hours F/u renal function, cultures and clinical course  Weight: 190 lb (86.2 kg)  Temp (24hrs), Avg:97.5 F (36.4 C), Min:97.5 F (36.4 C), Max:97.5 F (36.4 C)   Recent Labs Lab 07/13/17 1901 07/13/17 1942  WBC 21.2*  --   CREATININE 1.90*  --   LATICACIDVEN  --  7.5*    CrCl cannot be calculated (Unknown ideal weight.).    Allergies  Allergen Reactions  . Contrast Media [Iodinated Diagnostic Agents] Nausea And Vomiting  . Other Other (See Comments)    Cannot have "high doses of anesthesia because of his lungs"     Thank you for allowing pharmacy to be a part of this patient's care.  Woodfin GanjaSeay, Rembert Browe Poteet 07/13/2017 9:16 PM

## 2017-07-13 NOTE — ED Triage Notes (Signed)
Pt fell today while outside, also C/o difficulty breathing x2 days with productive cough. White thick sputum noted. Upon arrival EMS states he was "almost unresponsive" upon arrival, low O2 sats. Pt on CPAP upon arrival, states he feels better with CPAP on.

## 2017-07-13 NOTE — ED Notes (Signed)
Date and time results received: 07/13/17  8:35 PM   Test:  Lactic Acid, Venous    Component Value Date/Time   LATICACIDVEN 7.5 [facility](HH) 07/13/2017 1942    Critical Value:   Name of Provider Notified: Zackowski  Orders Received? Or Actions Taken?:no new orders at this time.

## 2017-07-13 NOTE — ED Notes (Signed)
Pt in high 70s O2 sat with 4L Coppock, MD at bedside. Non-rebreather placed at 15 LPM. Respiratory at bedside. Continue with non-rebreather at this time.

## 2017-07-13 NOTE — H&P (Signed)
History and Physical    Austin MccreedyJames Allen [name] ZOX:[id number] DOB: 1941-08-06 DOA: 07/13/2017  [facility]   Patient coming from: Home.  I have personally briefly reviewed patient's [facility]  Chief Complaint: Shortness of breath.  HPI: Austin [name] is a 76 y.o. male with medical history significant of asthma/COPD, chronic atrial fibrillation, diastolic CHF, hypertension, hyperlipidemia type 2 diabetes, history of DVT and PE, PUD, GERD who is brought to the emergency department via EMS due to progressively worse dyspnea for the past few days.   Per patient and his wife, the patient has been feeling progressively worse short of breath for the past few days associated with take wife sputum producing cough. They denied fever, but complains of chills and night sweats. Denied earache, rhinorrhea, sore throat, chest pain, nausea or emesis. However, he has been feeling lightheaded and having palpitations. Earlier in the evening, he went outside to feed the dog then slipped on the mud. He was unable to get up by himself or with his wife's assistance. He became even more dyspneic. His wife subsequently called EMS who reported to the tree attached nursing staff that he was hypoxic, with low O2 saturations and almost unresponsive when they arrived on scene. They subsequently started him on BiPAP and brought into the emergency department. He denies abdominal pain, diarrhea, hematochezia, but complains of occasional melena and constipation. He denies dysuria, frequency or hematuria mentions nocturia. He denies blurred vision, polyuria or polydipsia. He denies skin rashes or pruritus.  ED Course: Initial vital signs in the emergency department were rectal temperature 97.51F, heart rate 86, blood pressure 134/82 mmHg, respirations 28 and O2 sat 96% on CPAP. He was started on BiPAP ventilation, given 3000 mL of normal saline bolus, Solu-Medrol 125 mg  IVP 1 dose, given bronchodilators, vancomycin and Zosyn.  Workup showed a WBC of 21.2 with 89% neutrophils, hemoglobin of 15.4 g/dL (baseline for the past 2 years is about 13-14) and platelets 159. Sodium was 137, potassium 4.8, chloride 103, bicarbonate 16 and initial lactic acid 7.5 mmol/L. BUN was 33, creatinine 1.9, glucose 130 and bilirubin 2.1 mg/dL. His AST was mildly elevated at 46. BNP was 1769 pg/mL. EKG was sinus rhythm with without ischemic changes, but significant for RBBB, which has been present before.  Imaging: His chest radiograph showed cardiomegaly with mild interstitial edema. Questionable consolidations on both lung bases seen on CT chest and abdomen/pelvis. Also incidentally found on CT ascending aortic arch aneurysm, advanced emphysema, extensive CAD, left mild hydronephrosis with 15 mm urolithiasis among other findings. Please see images and full radiology report for further detail.  Review of Systems: As per HPI otherwise 10 point review of systems negative.    Past Medical History:  Diagnosis Date  . Asthma   . Atrial fibrillation (HCC)   . CHF (congestive heart failure) (HCC)   . COPD (chronic obstructive pulmonary disease) (HCC)   . Diabetes mellitus without complication (HCC)   . DVT (deep venous thrombosis) (HCC)   . Gastric ulcer   . GERD (gastroesophageal reflux disease)   . Hypercholesteremia   . Hypertension   . PE (pulmonary embolism)     Past Surgical History:  Procedure Laterality Date  . HERNIA REPAIR    . PERIPHERAL VASCULAR CATHETERIZATION N/A 11/03/2015   Procedure: IVC Filter Insertion;  Surgeon: Annice NeedyJason S Dew, MD;  Location: [facility] INVASIVE CV LAB;  Service: Cardiovascular;  Laterality: N/A;  . PERIPHERAL VASCULAR  CATHETERIZATION N/A 07/24/2016   Procedure: IVC Filter Removal;  Surgeon: Annice Needy, MD;  Location: [facility] INVASIVE CV LAB;  Service: Cardiovascular;  Laterality: N/A;  . PROSTATE SURGERY       reports that he has been smoking  Cigarettes.  He has a 22.50 pack-year smoking history. He has never used smokeless tobacco. He reports that he does not drink alcohol or use drugs.  Allergies  Allergen Reactions  . Contrast Media [Iodinated Diagnostic Agents] Nausea And Vomiting  . Other Other (See Comments)    Cannot have "high doses of anesthesia because of his lungs"    Family History  Problem Relation Age of Onset  . CAD Brother   . Congestive Heart Failure Brother     Prior to Admission medications   Medication Sig Start Date End Date Taking? Authorizing Provider  albuterol (PROAIR HFA) 108 (90 Base) MCG/ACT inhaler Inhale 1-2 puffs into the lungs every 6 (six) hours as needed for wheezing or shortness of breath.   Yes [provider]  albuterol (PROVENTIL) (2.5 MG/3ML) 0.083% nebulizer solution Take 3 mLs (2.5 mg total) by nebulization every 6 (six) hours as needed for wheezing or shortness of breath. 07/08/15  Yes Alford Highland, MD  aspirin EC 81 MG tablet Take 81 mg by mouth daily.   Yes [provider]  budesonide-formoterol (SYMBICORT) 160-4.5 MCG/ACT inhaler Inhale 2 puffs into the lungs 2 (two) times daily as needed (for shortness of breath).    Yes [provider]  furosemide (LASIX) 40 MG tablet Take 1 tablet (40 mg total) by mouth 2 (two) times daily. 07/21/15  Yes Hackney, Tina A, FNP  glipiZIDE (GLUCOTROL) 5 MG tablet Take 5 mg by mouth 2 (two) times daily.   Yes [provider]  lisinopril (PRINIVIL,ZESTRIL) 40 MG tablet Take 40 mg by mouth daily.   Yes [provider]  lovastatin (MEVACOR) 40 MG tablet Take 40 mg by mouth at bedtime. Pt should take with food.    Yes [provider]  metFORMIN (GLUCOPHAGE) 500 MG tablet Take 500 mg by mouth 2 (two) times daily with a meal.   Yes [provider]  metoprolol tartrate (LOPRESSOR) 25 MG tablet Take 12.5 mg by mouth 2 (two) times daily.    Yes [provider]  omeprazole (PRILOSEC) 40 MG  capsule Take 40 mg by mouth daily.   Yes [provider]  polyethylene glycol (MIRALAX) packet Take 17 g by mouth daily as needed for moderate constipation. 08/17/15  Yes Delfino Lovett, MD  nitroGLYCERIN (NITROSTAT) 0.4 MG SL tablet Place 0.4 mg under the tongue every 5 (five) minutes x 3 doses as needed for chest pain. If no relief call 911 or go to emergency room.    [provider]    Physical Exam: Vitals:   07/13/17 2235 07/13/17 2236 07/13/17 2300 07/13/17 2304  BP: 112/80  110/82   Pulse: 72 73  73  Resp: 20 (!) 22    Temp:      TempSrc:      SpO2: 90% 90%  90%  Weight:        Constitutional: NAD, calm, comfortable Eyes: PERRL, lids and conjunctivae normal ENMT: NRB O2 mask on, Mucous membranes are dry. Posterior pharynx clear of any exudate or lesions. Neck: normal, supple, no masses, no thyromegaly Respiratory: Decreased breath sounds with mild bilateral rhonchi and wheezing, no crackles. Normal respiratory effort. No accessory muscle use.  Cardiovascular: Regular rate and rhythm, no murmurs /  rubs / gallops. No extremity edema. 2+ pedal pulses. No carotid bruits.  Abdomen: Soft, no tenderness, no masses palpated. No hepatosplenomegaly. Bowel sounds positive.  Musculoskeletal: no clubbing / cyanosis.  Good ROM, no contractures. Normal muscle tone.  Skin: Positive abrasions on left and right knees. Chronic right pretibial area wounds with minimal serous sanguinous discharge.  Neurologic: CN 2-12 grossly intact. Sensation intact, DTR normal. Strength 5/5 in all 4.  Psychiatric: Normal judgment and insight. Alert and oriented x 4. Normal mood.    Labs on Admission: I have personally reviewed following labs and imaging studies  CBC:  Recent Labs Lab 07/13/17 1901  WBC 21.2*  NEUTROABS 18.8*  HGB 15.4  HCT 49.5  MCV 95.2  PLT 159   Basic Metabolic Panel:  Recent Labs Lab 07/13/17 1901  NA 137  K 4.8  CL 103  CO2 16*  GLUCOSE 130*  BUN 33*    CREATININE 1.90*  CALCIUM 9.7   GFR: CrCl cannot be calculated (Unknown ideal weight.). Liver Function Tests:  Recent Labs Lab 07/13/17 1901  AST 46*  ALT 15*  ALKPHOS 76  BILITOT 2.1*  PROT 6.8  ALBUMIN 3.6   No results for input(s): LIPASE, AMYLASE in the last 168 hours. No results for input(s): AMMONIA in the last 168 hours. Coagulation Profile: No results for input(s): INR, PROTIME in the last 168 hours. Cardiac Enzymes: No results for input(s): CKTOTAL, CKMB, CKMBINDEX, TROPONINI in the last 168 hours. BNP (last 3 results) No results for input(s): PROBNP in the last 8760 hours. HbA1C: No results for input(s): HGBA1C in the last 72 hours. CBG: No results for input(s): GLUCAP in the last 168 hours. Lipid Profile: No results for input(s): CHOL, HDL, LDLCALC, TRIG, CHOLHDL, LDLDIRECT in the last 72 hours. Thyroid Function Tests: No results for input(s): TSH, T4TOTAL, FREET4, T3FREE, THYROIDAB in the last 72 hours. Anemia Panel: No results for input(s): VITAMINB12, FOLATE, FERRITIN, TIBC, IRON, RETICCTPCT in the last 72 hours. Urine analysis:    Component Value Date/Time   COLORURINE YELLOW (A) 04/11/2015 1225   APPEARANCEUR CLEAR (A) 04/11/2015 1225   APPEARANCEUR Hazy 03/19/2014 2110   LABSPEC 1.005 04/11/2015 1225   LABSPEC 1.018 03/19/2014 2110   PHURINE 7.0 04/11/2015 1225   GLUCOSEU NEGATIVE 04/11/2015 1225   GLUCOSEU Negative 03/19/2014 2110   HGBUR 3+ (A) 04/11/2015 1225   BILIRUBINUR NEGATIVE 04/11/2015 1225   BILIRUBINUR Negative 03/19/2014 2110   KETONESUR NEGATIVE 04/11/2015 1225   PROTEINUR NEGATIVE 04/11/2015 1225   NITRITE NEGATIVE 04/11/2015 1225   LEUKOCYTESUR NEGATIVE 04/11/2015 1225   LEUKOCYTESUR Negative 03/19/2014 2110    Radiological Exams on Admission: Ct Abdomen Pelvis Wo Contrast  Result Date: 07/13/2017 CLINICAL DATA:  Abnormal finding on chest CT prompting recommendation for abdominal CT. Small amount of fluid around the spleen.  Recently admitted with sepsis, on antibiotics. EXAM: CT ABDOMEN AND PELVIS WITHOUT CONTRAST TECHNIQUE: Multidetector CT imaging of the abdomen and pelvis was performed following the standard protocol without IV contrast. COMPARISON:  Chest CT from earlier today. CT abdomen dated 04/11/2015. FINDINGS: Lower chest: Bibasilar consolidations, as also described on chest CT from earlier today, most likely atelectasis. Hepatobiliary: No focal liver abnormality is seen. Status post cholecystectomy. No biliary dilatation. Pancreas: Unremarkable. No pancreatic ductal dilatation or surrounding inflammatory changes. Spleen: Normal in size without focal abnormality. Adrenals/Urinary Tract: Bulbous left adrenal gland appears stable. Right adrenal gland is unremarkable. Multiple left renal stones, largest measuring 1.5 cm. Additional 15 mm stone within the proximal  left ureter with associated mild hydronephrosis. No right renal stone or hydronephrosis. Bladder is unremarkable, partially decompressed. Stomach/Bowel: Bowel is normal in caliber. Left inguinal hernia contains a portion of the left colon but there is no associated obstruction or inflammation at the hernia site. Moderate amount of stool throughout the nondistended colon. No bowel wall thickening or evidence of bowel wall inflammation seen. Vascular/Lymphatic: Aortic atherosclerosis. No enlarged abdominal or pelvic lymph nodes. Reproductive: Prostate gland is mildly prominent in size causing slight mass effect on the bladder base. Other: Small amount of free fluid in the left upper quadrant adjacent to the spleen, simple fluid density. Additional trace free fluid/fluid stranding in the central abdomen. No evidence of active hemorrhage. No evidence of abscess collection. No free intraperitoneal air. Musculoskeletal: Degenerative changes in the lower lumbar spine, mild to moderate in degree. No acute or suspicious osseous finding. IMPRESSION: 1. Small amount of free fluid  in the left upper quadrant, adjacent to the spleen, simple fluid density consistent with simple ascites. No evidence of acute hemorrhage in the abdomen or pelvis. Adjacent spleen is unremarkable, although characterization limited by the lack of IV contrast. 2. 15 mm stone within the proximal left ureter, just distal to the left UPJ, causing mild hydronephrosis. 3. Left nephrolithiasis. 4. Left inguinal hernia which contains a loop of the left colon, without associated bowel obstruction or inflammation. 5. Aortic atherosclerosis. 6. Bibasilar consolidations, likely atelectasis. Electronically Signed   By: Bary Richard M.D.   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 Pt fell while outside today. Almost unresponsive when brought to ER. Unable to elaborate on pain or would history. Diabetic. EXAM: RIGHT TIBIA AND FIBULA - 2 VIEW COMPARISON:  None. FINDINGS: No osseous fracture or dislocation seen. No acute or suspicious osseous lesion. No destructive change to suggest osteomyelitis. Bandages overlying the mid tib-fib, with questionable underlying soft tissue edema. Extensive atherosclerotic calcifications. IMPRESSION: 1. No osseous fracture or dislocation. No evidence of osteomyelitis seen. 2. Bandages overlying the mid tib-fib, presumably related to given history of chronic wound. Electronically Signed   By: Bary Richard M.D.   On: 07/13/2017 20:42   Ct Chest Wo Contrast  Result Date: 07/13/2017 CLINICAL DATA:  Pt fell today while outside, also C/o difficulty breathing x2 days with productive cough. White thick sputum noted. Upon arrival EMS states he was "almost unresponsive" upon arrival, low O2 sats EXAM: CT CHEST WITHOUT CONTRAST TECHNIQUE: Multidetector CT imaging of the chest was performed following the standard protocol without IV contrast. COMPARISON:  07/13/2017 chest x-ray FINDINGS: Cardiovascular: Heart size is prominent. Trace  pericardial effusion. Extensive coronary artery calcifications. There is atherosclerotic calcification of the thoracic aorta. Ascending aorta is 4.1 cm. Aortic arch is 3.9 cm. Main pulmonary artery is prominent, 4.1 cm. Mediastinum/Nodes: The visualized portion of the thyroid gland has a normal appearance. No mediastinal, hilar, or axillary adenopathy. Esophagus is normal in appearance. Lungs/Pleura: There are extensive paraseptal and panlobular emphysematous changes throughout the lungs, primarily involving the lung apices. Within the right middle lobe there is a spiculated mass measuring 12 mm on image 103 of series 4. A spiculated mass measures 11 mm in the right upper lobe on image 60 of series 4, adjacent to an area of scarring. These nodules appear stable compared with prior exams including 2014. Trace bilateral pleural effusions. There is bibasilar dependent atelectasis versus consolidation or contusion, left greater than right. Upper Abdomen: Within  the upper abdomen, a small amount of fluid is identified around the spleen, not further characterized. Consider further evaluation with CT of the abdomen and pelvis. Musculoskeletal: Degenerative changes are seen in thoracic spine. No acute vertebral fracture. Sternum is intact. IMPRESSION: 1. Fluid within the upper abdomen warranting further evaluation. Consider CT of the abdomen and pelvis with intravenous contrast unless it is contraindicated. 2. Stable spiculated masses in the right middle lobe and for right upper lobe, measuring 12 mm and 11 mm respectively. Given the long-term stability, benign process is favored. 3. Advanced emphysema.  Emphysema (ICD10-J43.9). 4. Bibasilar dependent atelectasis/consolidation, or contusion. 5. Extensive coronary artery disease. 6.  Aortic Atherosclerosis (ICD10-I70.0). 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 Aortic aneurysm NOS (ICD10-I71.9). Recommend annual imaging followup by CTA or MRA. This recommendation  follows 2010 ACCF/AHA/AATS/ACR/ASA/SCA/SCAI/SIR/STS/SVM Guidelines for the Diagnosis and Management of Patients with Thoracic Aortic Disease. Circulation. 2010; 121: e266-e369 8. Enlarged pulmonary arteries compatible with pulmonary arterial hypertension. These results were called by telephone at the time of interpretation on 07/13/2017 at 9:17 pm to Dr. Vanetta Mulders , who verbally acknowledged these results. Electronically Signed   By: Norva Pavlov M.D.   On: 07/13/2017 21:18   Dg Chest Port 1 View  Result Date: 07/13/2017 CLINICAL DATA:  76 y/o M; shortness of breath with fall. Difficulty breathing with productive cough for 2 days. EXAM: PORTABLE CHEST 1 VIEW COMPARISON:  12/26/2016 chest radiograph FINDINGS: Stable cardiomegaly given projection and technique. Pulmonary venous hypertension and prominent interstitial markings probably representing interstitial pulmonary edema. No focal consolidation. No pleural effusion. No acute osseous abnormality is evident. IMPRESSION: Cardiomegaly and mild interstitial pulmonary edema. No focal consolidation identified. Electronically Signed   By: Mitzi Hansen M.D.   On: 07/13/2017 19:15    EKG: Independently reviewed. Vent. rate 84 BPM PR interval * ms QRS duration 174 ms QT/QTc 438/518 ms P-R-T axes 94 223 -23 Sinus rhythm Borderline prolonged PR interval Probable left atrial enlargement Right bundle branch block  Assessment/Plan Principal Problem:   Sepsis due to undetermined organism with acute respiratory failure (HCC) Admit to stepdown/inpatient. Still waiting for urinalysis report. Bibasilar consolidation versus likely atelectasis on imaging. However, given clinical presentation, lactic acidosis and leukocytosis will continue broad antimicrobial coverage with IV antibiotics for the moment. Continue supplemental oxygen and BiPAP ventilation. Follow-up blood cultures and sensitivity. Follow-up. Analysis results.  Follow-up CBC, CMP  and lactic acid level later this morning.  Active Problems:   COPD exacerbation (HCC) He received Solu-Medrol 125 mg in the emergency department. Continue supplemental oxygen and BiPAP ventilation Continue scheduled and as needed bronchodilators. Continues Symbicort or formulary equivalent.    Lactic acidosis The patient's lactic acidosis resolved while he was on BiPAP. He was switched to nonrebreather mask oxygen and lactic acidosis again to 4.71. Resume BiPAP and keep overnight. Follow-up lactic acid level.    Type 2 diabetes mellitus (HCC) Carbohydrate modified diet. Hold metformin due to lactic acidosis. Continue glipizide 5 mg by mouth twice a day.    HLD (hyperlipidemia) Continue lovastatin 40 mg by mouth daily or formulary equivalent. Monitor LFTs as needed. Fasting lipid profile follow-up as an outpatient.    GERD (gastroesophageal reflux disease) Pantoprazole 40 mg by mouth daily.    Essential hypertension Furosemide and lisinopril held due to creatinine increase. Follow-up blood pressure, renal function and electrolytes in a.m. Resume medications as needed.    Elevated bilirubin Follow-up bilirubin level in a.m. Check RUQ ultrasound in a.m.    Ascending  aortic aneurysm [facility](HCC) He will need yearly surveillance. This was discussed briefly with him and his wife.    Left nephrolithiasis Asymptomatic at this time, 15 mm urolith incidentally found, causing mild hydronephrosis. His urinalysis still pending. Follow-up renal function. Inpatient vs outpatient urology follow-up in the near future.      DVT prophylaxis: Lovenox SQ. Code Status: Full code. Family Communication: His spouse was present in the ED. Disposition Plan: Admit for BiPAP ventilation, sepsis and COPD exacerbation treatment.  Consults called:  Admission status: inpatient/stepdown.   Bobette Moavid Manuel Ortiz MD [facility] Pager [phone].  If 7PM-7AM, please contact  night-coverage [email or web address] Password [email or web address]  07/13/2017, 11:41 PM

## 2017-07-13 NOTE — Progress Notes (Signed)
Patient off BiPAP back on 4lpm Saturation 92 but intermittent due to low perfusion.

## 2017-07-13 NOTE — ED Notes (Signed)
Respiratory removed bi-pap d/t poor fitting seal and placed on 4L [place] with sats in high 90s. MD Zackowski notified and ok'd.

## 2017-07-14 ENCOUNTER — Inpatient Hospital Stay (HOSPITAL_COMMUNITY): Payer: Medicare Other

## 2017-07-14 DIAGNOSIS — E872 Acidosis, unspecified: Secondary | ICD-10-CM | POA: Diagnosis present

## 2017-07-14 DIAGNOSIS — I7121 Aneurysm of the ascending aorta, without rupture: Secondary | ICD-10-CM | POA: Diagnosis present

## 2017-07-14 DIAGNOSIS — I712 Thoracic aortic aneurysm, without rupture: Secondary | ICD-10-CM | POA: Diagnosis present

## 2017-07-14 DIAGNOSIS — I361 Nonrheumatic tricuspid (valve) insufficiency: Secondary | ICD-10-CM

## 2017-07-14 DIAGNOSIS — N200 Calculus of kidney: Secondary | ICD-10-CM

## 2017-07-14 DIAGNOSIS — R17 Unspecified jaundice: Secondary | ICD-10-CM | POA: Diagnosis present

## 2017-07-14 LAB — CBC WITH DIFFERENTIAL/PLATELET
Basophils Absolute: 0 10*3/uL (ref 0.0–0.1)
Basophils Relative: 0 %
EOS ABS: 0 10*3/uL (ref 0.0–0.7)
Eosinophils Relative: 0 %
HCT: 45.5 % (ref 39.0–52.0)
HEMOGLOBIN: 14.4 g/dL (ref 13.0–17.0)
LYMPHS ABS: 0.6 10*3/uL — AB (ref 0.7–4.0)
Lymphocytes Relative: 3 %
MCH: 29.3 pg (ref 26.0–34.0)
MCHC: 31.6 g/dL (ref 30.0–36.0)
MCV: 92.7 fL (ref 78.0–100.0)
Monocytes Absolute: 0.7 10*3/uL (ref 0.1–1.0)
Monocytes Relative: 4 %
NEUTROS ABS: 17.2 10*3/uL — AB (ref 1.7–7.7)
NEUTROS PCT: 93 %
Platelets: 161 10*3/uL (ref 150–400)
RBC: 4.91 MIL/uL (ref 4.22–5.81)
RDW: 17.6 % — ABNORMAL HIGH (ref 11.5–15.5)
WBC: 18.5 10*3/uL — AB (ref 4.0–10.5)

## 2017-07-14 LAB — ECHOCARDIOGRAM COMPLETE
AOVTI: 37 cm
AV Mean grad: 11 mmHg
AVPG: 29 mmHg
AVPKVEL: 269 cm/s
DOP CAL AO MEAN VELOCITY: 147 cm/s
E/e' ratio: 8.04
EWDT: 201 ms
FS: 39 % (ref 28–44)
Height: 72 in
IV/PV OW: 1.1
LA diam index: 1.35 cm/m2
LA vol A4C: 28.3 ml
LA vol: 37.5 mL
LASIZE: 29 mm
LAVOLIN: 17.5 mL/m2
LDCA: 3.14 cm2
LEFT ATRIUM END SYS DIAM: 29 mm
LV PW d: 16.7 mm — AB (ref 0.6–1.1)
LV TDI E'MEDIAL: 4.46
LVEEAVG: 8.04
LVEEMED: 8.04
LVELAT: 7.51 cm/s
LVOT diameter: 20 mm
Lateral S' vel: 9.9 cm/s
MV Dec: 201
MV pk A vel: 98.5 m/s
MV pk E vel: 60.4 m/s
RV TAPSE: 9.66 mm
RV sys press: 81 mmHg
Reg peak vel: 406 cm/s
TDI e' lateral: 7.51
TR max vel: 406 cm/s
Weight: 3167.57 oz

## 2017-07-14 LAB — GLUCOSE, CAPILLARY
GLUCOSE-CAPILLARY: 162 mg/dL — AB (ref 65–99)
Glucose-Capillary: 186 mg/dL — ABNORMAL HIGH (ref 65–99)
Glucose-Capillary: 279 mg/dL — ABNORMAL HIGH (ref 65–99)
Glucose-Capillary: 240 mg/dL — ABNORMAL HIGH (ref 65–99)

## 2017-07-14 LAB — COMPREHENSIVE METABOLIC PANEL
ALBUMIN: 3.3 g/dL — AB (ref 3.5–5.0)
ALK PHOS: 71 U/L (ref 38–126)
ALT: 12 U/L — AB (ref 17–63)
AST: 37 U/L (ref 15–41)
Anion gap: 11 (ref 5–15)
BUN: 39 mg/dL — ABNORMAL HIGH (ref 6–20)
CALCIUM: 9.6 mg/dL (ref 8.9–10.3)
CO2: 24 mmol/L (ref 22–32)
CREATININE: 1.85 mg/dL — AB (ref 0.61–1.24)
Chloride: 103 mmol/L (ref 101–111)
GFR calc non Af Amer: 34 mL/min — ABNORMAL LOW (ref >60)
GFR, EST AFRICAN AMERICAN: 39 mL/min — AB (ref >60)
GLUCOSE: 160 mg/dL — AB (ref 65–99)
Potassium: 4.6 mmol/L (ref 3.5–5.1)
SODIUM: 138 mmol/L (ref 135–145)
Total Bilirubin: 1.9 mg/dL — ABNORMAL HIGH (ref 0.3–1.2)
Total Protein: 6 g/dL — ABNORMAL LOW (ref 6.5–8.1)

## 2017-07-14 LAB — LACTIC ACID, PLASMA
LACTIC ACID, VENOUS: 3.3 mmol/L — AB (ref 0.5–1.9)
Lactic Acid, Venous: 3.3 mmol/L (ref 0.5–1.9)
Lactic Acid, Venous: 2.4 mmol/L (ref 0.5–1.9)
Lactic Acid, Venous: 2.6 mmol/L (ref 0.5–1.9)

## 2017-07-14 LAB — MRSA PCR SCREENING: MRSA by PCR: NEGATIVE

## 2017-07-14 LAB — BILIRUBIN, DIRECT: BILIRUBIN DIRECT: 0.8 mg/dL — AB (ref 0.1–0.5)

## 2017-07-14 LAB — I-STAT CG4 LACTIC ACID, ED: LACTIC ACID, VENOUS: 4.71 mmol/L — AB (ref 0.5–1.9)

## 2017-07-14 MED ORDER — HEPARIN SODIUM (PORCINE) 5000 UNIT/ML IJ SOLN
5000.0000 [IU] | Freq: Three times a day (TID) | INTRAMUSCULAR | Status: DC
  Administered 2017-07-14 – 2017-07-15 (×5): 5000 [IU] via SUBCUTANEOUS
  Filled 2017-07-14 (×4): qty 1

## 2017-07-14 MED ORDER — LORAZEPAM 2 MG/ML IJ SOLN: 0.5000 mg | INTRAMUSCULAR | Status: DC | PRN

## 2017-07-14 MED ORDER — PRAVASTATIN SODIUM 40 MG PO TABS
40.0000 mg | ORAL_TABLET | Freq: Every day | ORAL | Status: DC
  Administered 2017-07-14 – 2017-07-20 (×7): 40 mg via ORAL
  Filled 2017-07-14 (×7): qty 1

## 2017-07-14 MED ORDER — IPRATROPIUM-ALBUTEROL 0.5-2.5 (3) MG/3ML IN SOLN
3.0000 mL | Freq: Four times a day (QID) | RESPIRATORY_TRACT | Status: DC
  Administered 2017-07-14 – 2017-07-17 (×11): 3 mL via RESPIRATORY_TRACT
  Filled 2017-07-14 (×11): qty 3

## 2017-07-14 MED ORDER — POLYETHYLENE GLYCOL 3350 17 G PO PACK: 17.0000 g | PACK | Freq: Every day | ORAL | Status: DC | PRN

## 2017-07-14 MED ORDER — MOMETASONE FURO-FORMOTEROL FUM 200-5 MCG/ACT IN AERO
2.0000 | INHALATION_SPRAY | Freq: Two times a day (BID) | RESPIRATORY_TRACT | Status: DC
  Administered 2017-07-14 – 2017-07-15 (×3): 2 via RESPIRATORY_TRACT
  Filled 2017-07-14: qty 8.8

## 2017-07-14 MED ORDER — NITROGLYCERIN 0.4 MG SL SUBL: 0.4000 mg | SUBLINGUAL_TABLET | SUBLINGUAL | Status: DC | PRN

## 2017-07-14 MED ORDER — INSULIN ASPART 100 UNIT/ML ~~LOC~~ SOLN
0.0000 [IU] | Freq: Three times a day (TID) | SUBCUTANEOUS | Status: DC
  Administered 2017-07-14: 3 [IU] via SUBCUTANEOUS
  Administered 2017-07-14: 8 [IU] via SUBCUTANEOUS
  Administered 2017-07-14 – 2017-07-15 (×2): 3 [IU] via SUBCUTANEOUS
  Administered 2017-07-15: 8 [IU] via SUBCUTANEOUS
  Administered 2017-07-15: 3 [IU] via SUBCUTANEOUS
  Administered 2017-07-16: 8 [IU] via SUBCUTANEOUS
  Administered 2017-07-16: 3 [IU] via SUBCUTANEOUS
  Administered 2017-07-16: 5 [IU] via SUBCUTANEOUS
  Administered 2017-07-17: 3 [IU] via SUBCUTANEOUS
  Administered 2017-07-17: 8 [IU] via SUBCUTANEOUS
  Administered 2017-07-17: 3 [IU] via SUBCUTANEOUS

## 2017-07-14 MED ORDER — ASPIRIN EC 81 MG PO TBEC
81.0000 mg | DELAYED_RELEASE_TABLET | Freq: Every day | ORAL | Status: DC
  Administered 2017-07-14 – 2017-07-21 (×8): 81 mg via ORAL
  Filled 2017-07-14 (×8): qty 1

## 2017-07-14 MED ORDER — GLIPIZIDE 5 MG PO TABS
5.0000 mg | ORAL_TABLET | Freq: Two times a day (BID) | ORAL | Status: DC
  Administered 2017-07-14 (×3): 5 mg via ORAL
  Filled 2017-07-14 (×2): qty 1

## 2017-07-14 MED ORDER — IPRATROPIUM BROMIDE 0.02 % IN SOLN: 0.5000 mg | Freq: Four times a day (QID) | RESPIRATORY_TRACT | Status: DC | PRN

## 2017-07-14 MED ORDER — PANTOPRAZOLE SODIUM 40 MG PO TBEC
40.0000 mg | DELAYED_RELEASE_TABLET | Freq: Every day | ORAL | Status: DC
  Administered 2017-07-14 – 2017-07-21 (×8): 40 mg via ORAL
  Filled 2017-07-14 (×8): qty 1

## 2017-07-14 MED ORDER — METHYLPREDNISOLONE SODIUM SUCC 40 MG IJ SOLR: 40.0000 mg | Freq: Four times a day (QID) | INTRAMUSCULAR | Status: DC

## 2017-07-14 MED ORDER — METOPROLOL TARTRATE 25 MG PO TABS
12.5000 mg | ORAL_TABLET | Freq: Two times a day (BID) | ORAL | Status: DC
  Administered 2017-07-14 – 2017-07-21 (×14): 12.5 mg via ORAL
  Filled 2017-07-14 (×16): qty 1

## 2017-07-14 MED ORDER — ALBUTEROL SULFATE (2.5 MG/3ML) 0.083% IN NEBU
2.5000 mg | INHALATION_SOLUTION | RESPIRATORY_TRACT | Status: DC | PRN
  Administered 2017-07-14: 2.5 mg via RESPIRATORY_TRACT
  Filled 2017-07-14: qty 3

## 2017-07-14 MED ORDER — IPRATROPIUM-ALBUTEROL 0.5-2.5 (3) MG/3ML IN SOLN: 3.0000 mL | Freq: Four times a day (QID) | RESPIRATORY_TRACT | Status: DC | PRN

## 2017-07-14 MED ORDER — ALBUTEROL SULFATE (2.5 MG/3ML) 0.083% IN NEBU: 2.5000 mg | INHALATION_SOLUTION | Freq: Four times a day (QID) | RESPIRATORY_TRACT | Status: DC | PRN

## 2017-07-14 NOTE — Progress Notes (Signed)
CRITICAL VALUE ALERT  Critical Value:  Lactic Acid 3.3  Date & Time Notied:  07/14/17 1604  Provider Notified: Dr.Krishnan  Orders Received/Actions taken: No new orders at this time

## 2017-07-14 NOTE — Progress Notes (Addendum)
PROGRESS NOTE  Austin MccreedyJames Allen [name] [name] DOB: 1941/05/07 DOA: 07/13/2017 [facility]  HPI/Recap of past 6124 hours: 76 year old male past oral history of COPD, diastolic heart failure and chronic atrial fibrillation brought into the emergency room on 8/4 for several days of progressively worsening shortness of breath. On day of admission, patient had been outside to feed his dog, but stopped in the mother and was unable to get up by himself or with Janann AugustWeiss assistance. EMS was called and patient was reportedly hypoxic with decreased level of consciousness. Patient was found to be in sepsis and acute respiratory failure requiring BiPAP. Brought into the hospitalist service. BNP also noted to be elevated at 1769. Patient started on IV fluids, antibiotics and admitted to the hospitalist service in the stepdown unit.  By morning, able to be weaned off of BiPAP. Lactic acid level trending downwards although not yet normalized.   Assessment/Plan: Principal Problem:   Sepsis due to undetermined organism with acute respiratory failure with hypoxia [facility](HCC): Patient meets criteria for sepsis on admission given lactic acidosis (7.5!), Leukocytosis, respiratory source and end organ failure. Responding to IV fluids and antibiotics. White count starting to trend slightly downwards. Lactic acid level slowly improving. Continue IV fluids and antibiotics. Blood cultures pending.  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 Continue sliding scale    HLD (hyperlipidemia)   GERD (gastroesophageal reflux disease)   Essential hypertension: Blood pressures stable, resumed on home antihypertensives Chronic atrial fibrillation: Not on any anticoagulation. Confirmed that is currently in normal sinus rhythm. We'll review old records to see if he would be contraindicated for long-term anticoagulation. Acute diastolic  heart failure: Noted pleural effusion seen on x-ray, elevated BNP. Checking echocardiogram. Would not start diuresis until sepsis resolved.   COPD exacerbation (HCC)   Ascending aortic aneurysm (HCC): Incidentally noted on admission on CT. Outpatient follow-up.    Code Status: Full code  Family Communication: Wife at the bedside  Disposition Plan: Continued ICU until sepsis resolved, potential transfer upstairs tomorrow.  Anticipate being in the hospital until Tuesday or Wednesday at the earliest     Consultants: None  Procedures:  Echocardiogram pending    Antimicrobials:  IV Zosyn and vancomycin 8/3-present    DVT prophylaxis:  Heparin    Objective: Vitals:   07/14/17 1114 07/14/17 1131 07/14/17 1200 07/14/17 1416  BP:  102/66 113/77   Pulse:  76 75   Resp:  (!) 23 18   Temp:  97.6 F (36.4 C)    TempSrc:  Oral    SpO2: 94% 95% 96% 93%  Weight:      Height:        Intake/Output Summary (Last 24 hours) at 07/14/17 1446 Last data filed at 07/14/17 1242  Gross per 24 hour  Intake             2080 ml  Output              725 ml  Net             1355 ml   Filed Weights   07/13/17 1841 07/14/17 0000  Weight: 86.2 kg (190 lb) 89.8 kg (197 lb 15.6 oz)    Exam:   General:  Alert and oriented 2, no acute distress  HEENT: Numbness fact electronic extremities are slightly dry excellent neck: Supple, no carotid bruits    Cardiovascular: Regular rate and  rhythm, S1-S2, occasional ectopic beat, 2/6 systolic ejection murmur  Respiratory:: Scattered rhonchi  , breathing not labored   Abdomen: Soft, nontender, nondistended, positive bowel sounds    Musculoskeletal: No clubbing or cyanosis, trace pitting edema    Skin: No skin breaks, tears or lesions  Psychiatry: Patient is appropriate, no evidence of psychoses     Data Reviewed: CBC:  Recent Labs Lab 07/13/17 1901 07/14/17 0443  WBC 21.2* 18.5*  NEUTROABS 18.8* 17.2*  HGB 15.4 14.4  HCT 49.5 45.5    MCV 95.2 92.7  PLT 159 161   Basic Metabolic Panel:  Recent Labs Lab 07/13/17 1901 07/14/17 0443  NA 137 138  K 4.8 4.6  CL 103 103  CO2 16* 24  GLUCOSE 130* 160*  BUN 33* 39*  CREATININE 1.90* 1.85*  CALCIUM 9.7 9.6   GFR: Estimated Creatinine Clearance: 37.9 mL/min (A) (by C-G formula based on SCr of 1.85 mg/dL (H)). Liver Function Tests:  Recent Labs Lab 07/13/17 1901 07/14/17 0443  AST 46* 37  ALT 15* 12*  ALKPHOS 76 71  BILITOT 2.1* 1.9*  PROT 6.8 6.0*  ALBUMIN 3.6 3.3*   No results for input(s): LIPASE, AMYLASE in the last 168 hours. No results for input(s): AMMONIA in the last 168 hours. Coagulation Profile: No results for input(s): INR, PROTIME in the last 168 hours. Cardiac Enzymes: No results for input(s): CKTOTAL, CKMB, CKMBINDEX, TROPONINI in the last 168 hours. BNP (last 3 results) No results for input(s): PROBNP in the last 8760 hours. HbA1C: No results for input(s): HGBA1C in the last 72 hours. CBG:  Recent Labs Lab 07/14/17 0746 07/14/17 1130  GLUCAP 162* 186*   Lipid Profile: No results for input(s): CHOL, HDL, LDLCALC, TRIG, CHOLHDL, LDLDIRECT in the last 72 hours. Thyroid Function Tests: No results for input(s): TSH, T4TOTAL, FREET4, T3FREE, THYROIDAB in the last 72 hours. Anemia Panel: No results for input(s): VITAMINB12, FOLATE, FERRITIN, TIBC, IRON, RETICCTPCT in the last 72 hours. Urine analysis:    Component Value Date/Time   COLORURINE YELLOW (A) 04/11/2015 1225   APPEARANCEUR CLEAR (A) 04/11/2015 1225   APPEARANCEUR Hazy 03/19/2014 2110   LABSPEC 1.005 04/11/2015 1225   LABSPEC 1.018 03/19/2014 2110   PHURINE 7.0 04/11/2015 1225   GLUCOSEU NEGATIVE 04/11/2015 1225   GLUCOSEU Negative 03/19/2014 2110   HGBUR 3+ (A) 04/11/2015 1225   BILIRUBINUR NEGATIVE 04/11/2015 1225   BILIRUBINUR Negative 03/19/2014 2110   KETONESUR NEGATIVE 04/11/2015 1225   PROTEINUR NEGATIVE 04/11/2015 1225   NITRITE NEGATIVE 04/11/2015 1225    LEUKOCYTESUR NEGATIVE 04/11/2015 1225   LEUKOCYTESUR Negative 03/19/2014 2110   Sepsis Labs: @LABRCNTIP (procalcitonin:4,lacticidven:4)  ) Recent Results (from the past 240 hour(s))  Blood Culture (routine x 2)     Status: None (Preliminary result)   Collection Time: 07/13/17  9:00 PM  Result Value Ref Range Status   Specimen Description BLOOD RIGHT FOREARM  Final   Special Requests   Final    Blood Culture results may not be optimal due to an inadequate volume of blood received in culture bottles   Culture NO GROWTH < 12 HOURS  Final   Report Status PENDING  Incomplete  Blood Culture (routine x 2)     Status: None (Preliminary result)   Collection Time: 07/13/17  9:00 PM  Result Value Ref Range Status   Specimen Description BLOOD LEFT FOREARM  Final   Special Requests   Final    Blood Culture results may not be optimal due to an  inadequate volume of blood received in culture bottles   Culture NO GROWTH < 12 HOURS  Final   Report Status PENDING  Incomplete      Studies: Ct Abdomen Pelvis Wo Contrast  Result Date: 07/13/2017 CLINICAL DATA:  Abnormal finding on chest CT prompting recommendation for abdominal CT. Small amount of fluid around the spleen. Recently admitted with sepsis, on antibiotics. EXAM: CT ABDOMEN AND PELVIS WITHOUT CONTRAST TECHNIQUE: Multidetector CT imaging of the abdomen and pelvis was performed following the standard protocol without IV contrast. COMPARISON:  Chest CT from earlier today. CT abdomen dated 04/11/2015. FINDINGS: Lower chest: Bibasilar consolidations, as also described on chest CT from earlier today, most likely atelectasis. Hepatobiliary: No focal liver abnormality is seen. Status post cholecystectomy. No biliary dilatation. Pancreas: Unremarkable. No pancreatic ductal dilatation or surrounding inflammatory changes. Spleen: Normal in size without focal abnormality. Adrenals/Urinary Tract: Bulbous left adrenal gland appears stable. Right adrenal gland is  unremarkable. Multiple left renal stones, largest measuring 1.5 cm. Additional 15 mm stone within the proximal left ureter with associated mild hydronephrosis. No right renal stone or hydronephrosis. Bladder is unremarkable, partially decompressed. Stomach/Bowel: Bowel is normal in caliber. Left inguinal hernia contains a portion of the left colon but there is no associated obstruction or inflammation at the hernia site. Moderate amount of stool throughout the nondistended colon. No bowel wall thickening or evidence of bowel wall inflammation seen. Vascular/Lymphatic: Aortic atherosclerosis. No enlarged abdominal or pelvic lymph nodes. Reproductive: Prostate gland is mildly prominent in size causing slight mass effect on the bladder base. Other: Small amount of free fluid in the left upper quadrant adjacent to the spleen, simple fluid density. Additional trace free fluid/fluid stranding in the central abdomen. No evidence of active hemorrhage. No evidence of abscess collection. No free intraperitoneal air. Musculoskeletal: Degenerative changes in the lower lumbar spine, mild to moderate in degree. No acute or suspicious osseous finding. IMPRESSION: 1. Small amount of free fluid in the left upper quadrant, adjacent to the spleen, simple fluid density consistent with simple ascites. No evidence of acute hemorrhage in the abdomen or pelvis. Adjacent spleen is unremarkable, although characterization limited by the lack of IV contrast. 2. 15 mm stone within the proximal left ureter, just distal to the left UPJ, causing mild hydronephrosis. 3. Left nephrolithiasis. 4. Left inguinal hernia which contains a loop of the left colon, without associated bowel obstruction or inflammation. 5. Aortic atherosclerosis. 6. Bibasilar consolidations, likely atelectasis. Electronically Signed   By: Bary RichardStan  Maynard M.D.   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  Pt fell while outside today. Almost unresponsive when brought to ER. Unable to elaborate on pain or would history. Diabetic. EXAM: RIGHT TIBIA AND FIBULA - 2 VIEW COMPARISON:  None. FINDINGS: No osseous fracture or dislocation seen. No acute or suspicious osseous lesion. No destructive change to suggest osteomyelitis. Bandages overlying the mid tib-fib, with questionable underlying soft tissue edema. Extensive atherosclerotic calcifications. IMPRESSION: 1. No osseous fracture or dislocation. No evidence of osteomyelitis seen. 2. Bandages overlying the mid tib-fib, presumably related to given history of chronic wound. Electronically Signed   By: Bary RichardStan  Maynard M.D.   On: 07/13/2017 20:42   Ct Chest Wo Contrast  Result Date: 07/13/2017 CLINICAL DATA:  Pt fell today while outside, also C/o difficulty breathing x2 days with productive cough. White thick sputum noted. Upon arrival EMS states he was "almost unresponsive" upon arrival, low O2  sats EXAM: CT CHEST WITHOUT CONTRAST TECHNIQUE: Multidetector CT imaging of the chest was performed following the standard protocol without IV contrast. COMPARISON:  07/13/2017 chest x-ray FINDINGS: Cardiovascular: Heart size is prominent. Trace pericardial effusion. Extensive coronary artery calcifications. There is atherosclerotic calcification of the thoracic aorta. Ascending aorta is 4.1 cm. Aortic arch is 3.9 cm. Main pulmonary artery is prominent, 4.1 cm. Mediastinum/Nodes: The visualized portion of the thyroid gland has a normal appearance. No mediastinal, hilar, or axillary adenopathy. Esophagus is normal in appearance. Lungs/Pleura: There are extensive paraseptal and panlobular emphysematous changes throughout the lungs, primarily involving the lung apices. Within the right middle lobe there is a spiculated mass measuring 12 mm on image 103 of series 4. A spiculated mass measures 11 mm in the right upper lobe on image 60 of series 4, adjacent to an area of scarring. These  nodules appear stable compared with prior exams including 2014. Trace bilateral pleural effusions. There is bibasilar dependent atelectasis versus consolidation or contusion, left greater than right. Upper Abdomen: Within the upper abdomen, a small amount of fluid is identified around the spleen, not further characterized. Consider further evaluation with CT of the abdomen and pelvis. Musculoskeletal: Degenerative changes are seen in thoracic spine. No acute vertebral fracture. Sternum is intact. IMPRESSION: 1. Fluid within the upper abdomen warranting further evaluation. Consider CT of the abdomen and pelvis with intravenous contrast unless it is contraindicated. 2. Stable spiculated masses in the right middle lobe and for right upper lobe, measuring 12 mm and 11 mm respectively. Given the long-term stability, benign process is favored. 3. Advanced emphysema.  Emphysema (ICD10-J43.9). 4. Bibasilar dependent atelectasis/consolidation, or contusion. 5. Extensive coronary artery disease. 6.  Aortic Atherosclerosis (ICD10-I70.0). 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 Aortic aneurysm NOS (ICD10-I71.9). Recommend annual imaging followup by CTA or MRA. This recommendation follows 2010 ACCF/AHA/AATS/ACR/ASA/SCA/SCAI/SIR/STS/SVM Guidelines for the Diagnosis and Management of Patients with Thoracic Aortic Disease. Circulation. 2010; 121: e266-e369 8. Enlarged pulmonary arteries compatible with pulmonary arterial hypertension. These results were called by telephone at the time of interpretation on 07/13/2017 at 9:17 pm to Dr. Vanetta Mulders , who verbally acknowledged these results. Electronically Signed   By: Norva Pavlov M.D.   On: 07/13/2017 21:18   Dg Chest Port 1 View  Result Date: 07/13/2017 CLINICAL DATA:  76 y/o M; shortness of breath with fall. Difficulty breathing with productive cough for 2 days. EXAM: PORTABLE CHEST 1 VIEW COMPARISON:  12/26/2016 chest radiograph FINDINGS: Stable cardiomegaly  given projection and technique. Pulmonary venous hypertension and prominent interstitial markings probably representing interstitial pulmonary edema. No focal consolidation. No pleural effusion. No acute osseous abnormality is evident. IMPRESSION: Cardiomegaly and mild interstitial pulmonary edema. No focal consolidation identified. Electronically Signed   By: Mitzi Hansen M.D.   On: 07/13/2017 19:15    Scheduled Meds: . aspirin EC  81 mg Oral Daily  . glipiZIDE  5 mg Oral BID  . heparin  5,000 Units Subcutaneous Q8H  . insulin aspart  0-15 Units Subcutaneous TID WC  . ipratropium-albuterol  3 mL Nebulization Q6H  . metoprolol tartrate  12.5 mg Oral BID  . mometasone-formoterol  2 puff Inhalation BID  . pantoprazole  40 mg Oral Daily  . pravastatin  40 mg Oral q1800    Continuous Infusions: . sodium chloride 100 mL/hr at 07/14/17 1102  . piperacillin-tazobactam (ZOSYN)  IV 3.375 g (07/14/17 1240)  . sodium chloride    . vancomycin    . vancomycin Stopped (07/13/17  2139)     LOS: 1 day     Hollice Espy, MD [facility] Pager [phone]  If 7PM-7AM, please contact night-coverage [email or web address] Password [email or web address] 07/14/2017, 2:46 PM

## 2017-07-14 NOTE — Progress Notes (Signed)
Patient is currently on HFNC at this time and in no distress. BIPAP is on standby for patient use and he will continue to be monitored.

## 2017-07-14 NOTE — Plan of Care (Signed)
Problem: Safety: Goal: Ability to remain free from injury will improve Outcome: Progressing Patient aware to use call bell when assistance needed, wife staying with patient at the bedside at this time, bed alarm in use, bed in lowest position, non-skid socks on

## 2017-07-14 NOTE — Progress Notes (Signed)
*  PRELIMINARY RESULTS* Echocardiogram 2D Echocardiogram has been performed.  Stacey DrainWhite, Deryk Bozman J 07/14/2017, 9:55 AM

## 2017-07-14 NOTE — ED Provider Notes (Signed)
[facility] Provider Note   CSN: [id number] Arrival date & time: 07/13/17  1839     History   Chief Complaint Chief Complaint  Patient presents with  . Shortness of Breath  . Fall    HPI Austin [name] is a 76 y.o. male.  The patient brought in by EMS. Patient has a history of COPD. Patient with complaint of shortness of breath at least for 2 days. Family member said longer periods associated with cough. Patient went outside to feed the dog slipped in the mud. No loss of consciousness. Was too weak to get back up and had significant breathing problems. EMS started him on CPAP, patient reportedly was lethargic at the scene but on the breathing support he improved significantly. Patient past medical history makes mention of congestive heart failure as well. 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 Patient also with complaint of right leg pain following from the fall.      Past Medical History:  Diagnosis Date  . Asthma   . Atrial fibrillation (HCC)   . CHF (congestive heart failure) (HCC)   . COPD (chronic obstructive pulmonary disease) (HCC)   . Diabetes mellitus without complication (HCC)   . DVT (deep venous thrombosis) (HCC)   . Gastric ulcer   . GERD (gastroesophageal reflux disease)   . Hypercholesteremia   . Hypertension   . PE (pulmonary embolism)     Patient Active Problem List   Diagnosis Date Noted  . Sepsis due to undetermined organism with acute respiratory failure (HCC) 07/13/2017  . COPD exacerbation (HCC) 12/26/2016  . Hypoxia 11/03/2015  . COPD (chronic obstructive pulmonary disease) (HCC) 11/03/2015  . DVT of popliteal vein (HCC) 11/03/2015  . Chronic systolic CHF (congestive heart failure) (HCC) 11/03/2015  . Essential hypertension 11/03/2015  . Diabetes mellitus (HCC) 11/03/2015  . Continuous tobacco abuse 11/03/2015  . Chronic pulmonary embolism (HCC) 11/03/2015  . 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  Hemoptysis 10/31/2015  . Elevated troponin 09/16/2015  . SOB (shortness of breath) 08/24/2015  . Pulmonary embolism (HCC) 08/24/2015  . 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 Type 2 diabetes mellitus (HCC) 08/16/2015  . HLD (hyperlipidemia) 08/16/2015  . GERD (gastroesophageal reflux disease) 08/16/2015  . Chest pain 08/16/2015  . Constipation 08/16/2015  . HTN (hypertension) 07/21/2015  . COPD (chronic obstructive pulmonary disease) with chronic bronchitis (HCC) 07/21/2015  . CHF (NYHA class IV, ACC/AHA stage D) (HCC) 07/04/2015  . CHF (congestive heart failure) (HCC) 07/04/2015    Past Surgical History:  Procedure Laterality Date  . HERNIA REPAIR    . PERIPHERAL VASCULAR CATHETERIZATION N/A 11/03/2015   Procedure: IVC Filter Insertion;  Surgeon: Annice NeedyJason S Dew, MD;  Location: [facility];  Service: Cardiovascular;  Laterality: N/A;  . PERIPHERAL VASCULAR CATHETERIZATION N/A 07/24/2016   Procedure: IVC Filter Removal;  Surgeon: Annice NeedyJason S Dew, MD;  Location: [facility];  Service: Cardiovascular;  Laterality: N/A;  . PROSTATE SURGERY         Home Medications    Prior to Admission medications   Medication Sig Start Date End Date Taking? Authorizing Provider  albuterol (PROAIR HFA) 108 (90 Base) MCG/ACT inhaler Inhale 1-2 puffs into the lungs every 6 (six) hours as needed for wheezing or shortness of breath.   Yes [provider]  albuterol (PROVENTIL) (2.5 MG/3ML) 0.083% nebulizer solution Take 3 mLs (2.5 mg total) by nebulization every 6 (  six) hours as needed for wheezing or shortness of breath. 07/08/15  Yes Wieting, Richard, MD  aspirin EC 81 MG tablet Take 81 mg by mouth daily.   Yes [provider]  budesonide-formoterol (SYMBICORT) 160-4.5 MCG/ACT inhaler Inhale 2 puffs into the lungs 2 (two) times daily as needed (for shortness of breath).    Yes [provider]  furosemide (LASIX) 40 MG  tablet Take 1 tablet (40 mg total) by mouth 2 (two) times daily. 07/21/15  Yes Hackney, Tina A, FNP  glipiZIDE (GLUCOTROL) 5 MG tablet Take 5 mg by mouth 2 (two) times daily.   Yes [provider]  lisinopril (PRINIVIL,ZESTRIL) 40 MG tablet Take 40 mg by mouth daily.   Yes [provider]  lovastatin (MEVACOR) 40 MG tablet Take 40 mg by mouth at bedtime. Pt should take with food.    Yes [provider]  metFORMIN (GLUCOPHAGE) 500 MG tablet Take 500 mg by mouth 2 (two) times daily with a meal.   Yes [provider]  metoprolol tartrate (LOPRESSOR) 25 MG tablet Take 12.5 mg by mouth 2 (two) times daily.    Yes [provider]  omeprazole (PRILOSEC) 40 MG capsule Take 40 mg by mouth daily.   Yes [provider]  polyethylene glycol (MIRALAX) packet Take 17 g by mouth daily as needed for moderate constipation. 08/17/15  Yes Delfino LovettShah, Vipul, MD  nitroGLYCERIN (NITROSTAT) 0.4 MG SL tablet Place 0.4 mg under the tongue every 5 (five) minutes x 3 doses as needed for chest pain. If no relief call 911 or go to emergency room.    [provider]    Family History Family History  Problem Relation Age of Onset  . CAD Brother   . Congestive Heart Failure Brother     Social History Social History  Substance Use Topics  . Smoking status: Current Every Day Smoker    Packs/day: 0.50    Years: 45.00    Types: Cigarettes  . Smokeless tobacco: Never Used  . Alcohol use No     Allergies   Contrast media [iodinated diagnostic agents] and Other   Review of Systems Review of Systems  Constitutional: Positive for fatigue. Negative for fever.  HENT: Negative for congestion.   Eyes: Negative for redness.  Respiratory: Positive for cough and shortness of breath.   Cardiovascular: Positive for leg swelling. Negative for chest pain.  Gastrointestinal: Negative for abdominal pain, nausea and vomiting.  Genitourinary: Negative for dysuria.    Musculoskeletal: Negative for back pain and neck pain.  Skin: Positive for wound.  Neurological: Negative for headaches.  Hematological: Does not bruise/bleed easily.  Psychiatric/Behavioral: Negative for confusion.     Physical Exam Updated Vital Signs BP 111/70   Pulse 73   Temp (!) 97.5 F (36.4 C) (Rectal)   Resp (!) 22   Wt 86.2 kg (190 lb)   SpO2 99%   BMI 25.07 kg/m   Physical Exam  Constitutional: He is oriented to person, place, and time. He appears well-developed and well-nourished. He appears distressed.  HENT:  Head: Normocephalic and atraumatic.  Mouth/Throat: Oropharynx is clear and moist.  Eyes: Pupils are equal, round, and reactive to light. Conjunctivae and EOM are normal.  Neck: Normal range of motion. Neck supple.  Cardiovascular: Normal rate and regular rhythm.   Pulmonary/Chest: He is in respiratory distress.  No wheezing but decreased breath sounds bilaterally rhonchi  Abdominal: Soft. Bowel sounds are normal. There is no tenderness.  Musculoskeletal: Normal  range of motion. He exhibits edema.  2 small superficial chronic wounds to the anterior shin of the right leg. Measuring about 1 x 1 cm each. Fresh abrasion to the patella aspect of the right knee. No effusion. No obvious deformity.  Neurological: He is alert and oriented to person, place, and time. No cranial nerve deficit or sensory deficit. He exhibits normal muscle tone. Coordination normal.  Skin: Skin is warm.  Nursing note and vitals reviewed.    ED Treatments / Results  Labs (all labs ordered are listed, but only abnormal results are displayed) Labs Reviewed  BRAIN NATRIURETIC PEPTIDE - Abnormal; Notable for the following:       Result Value   B Natriuretic Peptide 1,769.0 (*)    All other components within normal limits  COMPREHENSIVE METABOLIC PANEL - Abnormal; Notable for the following:    CO2 16 (*)    Glucose, Bld 130 (*)    BUN 33 (*)    Creatinine, Ser 1.90 (*)    AST 46  (*)    ALT 15 (*)    Total Bilirubin 2.1 (*)    GFR calc non Af Amer 33 (*)    GFR calc Af Amer 38 (*)    Anion gap 18 (*)    All other components within normal limits  CBC WITH DIFFERENTIAL/PLATELET - Abnormal; Notable for the following:    WBC 21.2 (*)    RDW 17.7 (*)    Neutro Abs 18.8 (*)    Monocytes Absolute 1.4 (*)    All other components within normal limits  LACTIC ACID, PLASMA - Abnormal; Notable for the following:    Lactic Acid, Venous 7.5 (*)    All other components within normal limits  I-STAT CG4 LACTIC ACID, ED - Abnormal; Notable for the following:    Lactic Acid, Venous 4.71 (*)    All other components within normal limits  CULTURE, BLOOD (ROUTINE X 2)  CULTURE, BLOOD (ROUTINE X 2)  LACTIC ACID, PLASMA  URINALYSIS, ROUTINE W REFLEX MICROSCOPIC  I-STAT CG4 LACTIC ACID, ED    EKG  EKG Interpretation  Date/Time:  Friday July 13 2017 18:58:14 EDT Ventricular Rate:  84 PR Interval:    QRS Duration: 174 QT Interval:  438 QTC Calculation: 518 R Axis:   -137 Text Interpretation:  Sinus rhythm Borderline prolonged PR interval Probable left atrial enlargement Right bundle branch block Interpretation limited secondary to artifact Confirmed by Vanetta Mulders [phone]) on 07/13/2017 7:29:37 PM       Radiology Ct Abdomen Pelvis Wo Contrast  Result Date: 07/13/2017 CLINICAL DATA:  Abnormal finding on chest CT prompting recommendation for abdominal CT. Small amount of fluid around the spleen. Recently admitted with sepsis, on antibiotics. EXAM: CT ABDOMEN AND PELVIS WITHOUT CONTRAST TECHNIQUE: Multidetector CT imaging of the abdomen and pelvis was performed following the standard protocol without IV contrast. COMPARISON:  Chest CT from earlier today. CT abdomen dated 04/11/2015. FINDINGS: Lower chest: Bibasilar consolidations, as also described on chest CT from earlier today, most likely atelectasis. Hepatobiliary: No focal liver abnormality is seen. Status post  cholecystectomy. No biliary dilatation. Pancreas: Unremarkable. No pancreatic ductal dilatation or surrounding inflammatory changes. Spleen: Normal in size without focal abnormality. Adrenals/Urinary Tract: Bulbous left adrenal gland appears stable. Right adrenal gland is unremarkable. Multiple left renal stones, largest measuring 1.5 cm. Additional 15 mm stone within the proximal left ureter with associated mild hydronephrosis. No right renal stone or hydronephrosis. Bladder is unremarkable, partially decompressed. Stomach/Bowel: Bowel is  normal in caliber. Left inguinal hernia contains a portion of the left colon but there is no associated obstruction or inflammation at the hernia site. Moderate amount of stool throughout the nondistended colon. No bowel wall thickening or evidence of bowel wall inflammation seen. Vascular/Lymphatic: Aortic atherosclerosis. No enlarged abdominal or pelvic lymph nodes. Reproductive: Prostate gland is mildly prominent in size causing slight mass effect on the bladder base. Other: Small amount of free fluid in the left upper quadrant adjacent to the spleen, simple fluid density. Additional trace free fluid/fluid stranding in the central abdomen. No evidence of active hemorrhage. No evidence of abscess collection. No free intraperitoneal air. Musculoskeletal: Degenerative changes in the lower lumbar spine, mild to moderate in degree. No acute or suspicious osseous finding. IMPRESSION: 1. Small amount of free fluid in the left upper quadrant, adjacent to the spleen, simple fluid density consistent with simple ascites. No evidence of acute hemorrhage in the abdomen or pelvis. Adjacent spleen is unremarkable, although characterization limited by the lack of IV contrast. 2. 15 mm stone within the proximal left ureter, just distal to the left UPJ, causing mild hydronephrosis. 3. Left nephrolithiasis. 4. Left inguinal hernia which contains a loop of the left colon, without associated bowel  obstruction or inflammation. 5. Aortic atherosclerosis. 6. Bibasilar consolidations, likely atelectasis. Electronically Signed   By: Bary Richard M.D.   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 Pt fell while outside today. Almost unresponsive when brought to ER. Unable to elaborate on pain or would history. Diabetic. EXAM: RIGHT TIBIA AND FIBULA - 2 VIEW COMPARISON:  None. FINDINGS: No osseous fracture or dislocation seen. No acute or suspicious osseous lesion. No destructive change to suggest osteomyelitis. Bandages overlying the mid tib-fib, with questionable underlying soft tissue edema. Extensive atherosclerotic calcifications. IMPRESSION: 1. No osseous fracture or dislocation. No evidence of osteomyelitis seen. 2. Bandages overlying the mid tib-fib, presumably related to given history of chronic wound. Electronically Signed   By: Bary Richard M.D.   On: 07/13/2017 20:42   Ct Chest Wo Contrast  Result Date: 07/13/2017 CLINICAL DATA:  Pt fell today while outside, also C/o difficulty breathing x2 days with productive cough. White thick sputum noted. Upon arrival EMS states he was "almost unresponsive" upon arrival, low O2 sats EXAM: CT CHEST WITHOUT CONTRAST TECHNIQUE: Multidetector CT imaging of the chest was performed following the standard protocol without IV contrast. COMPARISON:  07/13/2017 chest x-ray FINDINGS: Cardiovascular: Heart size is prominent. Trace pericardial effusion. Extensive coronary artery calcifications. There is atherosclerotic calcification of the thoracic aorta. Ascending aorta is 4.1 cm. Aortic arch is 3.9 cm. Main pulmonary artery is prominent, 4.1 cm. Mediastinum/Nodes: The visualized portion of the thyroid gland has a normal appearance. No mediastinal, hilar, or axillary adenopathy. Esophagus is normal in appearance. Lungs/Pleura: There are extensive paraseptal and panlobular emphysematous changes  throughout the lungs, primarily involving the lung apices. Within the right middle lobe there is a spiculated mass measuring 12 mm on image 103 of series 4. A spiculated mass measures 11 mm in the right upper lobe on image 60 of series 4, adjacent to an area of scarring. These nodules appear stable compared with prior exams including 2014. Trace bilateral pleural effusions. There is bibasilar dependent atelectasis versus consolidation or contusion, left greater than right. Upper Abdomen: Within the upper abdomen, a small amount of fluid is identified around the spleen, not further characterized. Consider further evaluation with  CT of the abdomen and pelvis. Musculoskeletal: Degenerative changes are seen in thoracic spine. No acute vertebral fracture. Sternum is intact. IMPRESSION: 1. Fluid within the upper abdomen warranting further evaluation. Consider CT of the abdomen and pelvis with intravenous contrast unless it is contraindicated. 2. Stable spiculated masses in the right middle lobe and for right upper lobe, measuring 12 mm and 11 mm respectively. Given the long-term stability, benign process is favored. 3. Advanced emphysema.  Emphysema (ICD10-J43.9). 4. Bibasilar dependent atelectasis/consolidation, or contusion. 5. Extensive coronary artery disease. 6.  Aortic Atherosclerosis (ICD10-I70.0). 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 Aortic aneurysm NOS (ICD10-I71.9). Recommend annual imaging followup by CTA or MRA. This recommendation follows 2010 ACCF/AHA/AATS/ACR/ASA/SCA/SCAI/SIR/STS/SVM Guidelines for the Diagnosis and Management of Patients with Thoracic Aortic Disease. Circulation. 2010; 121: e266-e369 8. Enlarged pulmonary arteries compatible with pulmonary arterial hypertension. These results were called by telephone at the time of interpretation on 07/13/2017 at 9:17 pm to Dr. Vanetta Mulders , who verbally acknowledged these results. Electronically Signed   By: Norva Pavlov M.D.   On:  07/13/2017 21:18   Dg Chest Port 1 View  Result Date: 07/13/2017 CLINICAL DATA:  76 y/o M; shortness of breath with fall. Difficulty breathing with productive cough for 2 days. EXAM: PORTABLE CHEST 1 VIEW COMPARISON:  12/26/2016 chest radiograph FINDINGS: Stable cardiomegaly given projection and technique. Pulmonary venous hypertension and prominent interstitial markings probably representing interstitial pulmonary edema. No focal consolidation. No pleural effusion. No acute osseous abnormality is evident. IMPRESSION: Cardiomegaly and mild interstitial pulmonary edema. No focal consolidation identified. Electronically Signed   By: Mitzi Hansen M.D.   On: 07/13/2017 19:15    Procedures Procedures (including critical care time)  CRITICAL CARE Performed by: Vanetta Mulders Total critical care time: 30 minutes Critical care time was exclusive of separately billable procedures and treating other patients. Critical care was necessary to treat or prevent imminent or life-threatening deterioration. Critical care was time spent personally by me on the following activities: development of treatment plan with patient and/or surrogate as well as nursing, discussions with consultants, evaluation of patient's response to treatment, examination of patient, obtaining history from patient or surrogate, ordering and performing treatments and interventions, ordering and review of laboratory studies, ordering and review of radiographic studies, pulse oximetry and re-evaluation of patient's condition.  Medications Ordered in ED Medications  0.9 %  sodium chloride infusion ( Intravenous New Bag/Given 07/13/17 1912)  sodium chloride 0.9 % bolus 1,000 mL (1,000 mLs Intravenous New Bag/Given 07/13/17 2251)    And  sodium chloride 0.9 % bolus 1,000 mL (1,000 mLs Intravenous Transfusing/Transfer 07/13/17 2359)    And  sodium chloride 0.9 % bolus 1,000 mL (0 mLs Intravenous Stopped 07/13/17 2251)    piperacillin-tazobactam (ZOSYN) IVPB 3.375 g (not administered)  vancomycin (VANCOCIN) 1,250 mg in sodium chloride 0.9 % 250 mL IVPB (not administered)  vancomycin (VANCOCIN) IVPB 750 mg/150 ml premix (0 mg Intravenous Hold 07/13/17 2139)  piperacillin-tazobactam (ZOSYN) IVPB 3.375 g (0 g Intravenous Stopped 07/13/17 2134)  vancomycin (VANCOCIN) IVPB 1000 mg/200 mL premix (0 mg Intravenous Stopped 07/13/17 2239)  methylPREDNISolone sodium succinate (SOLU-MEDROL) 125 mg/2 mL injection 125 mg (125 mg Intravenous Given 07/13/17 2343)     Initial Impression / Assessment and Plan / ED Course  I have reviewed the triage vital signs and the nursing notes.  Pertinent labs & imaging results that were available during my care of the patient were reviewed by me and considered in my medical decision making (see  chart for details).    Patient with a bit of a complex presentation. But patient certainly is been short of breath at least for the past 2 days. And family members felt that maybe he was short of breath for longer than that. Patient known to have COPD. Does have home oxygen but does not use it every day. Patient went outside today to feed the dogs he slipped and the mud and then was unable to get up family members could not get him up he was too weak when EMS arrived patient was confused and kind of out of it and was appeared to be very short of breath. He was started on CPAP. This showed improvement on the way in. Patient was complaining of some pain to his right leg from the fall at the knee has old wounds to the right shin. In addition patient was very wet and cold and had chills.  Workup here was regarding to the respiratory failure requiring the CPAP. Patient was switched over to BiPAP on arrival patient improved significantly on this. Was already fairly awake and answering questions when he arrived chest x-ray showed no acute findings. Expected to find pneumonia. Patient had poor air movement. CT of  chest was done showed no pulmonary edema no pneumonia. Raise concerns for fluid around the spleen so CT abdomen was done this was just a small amount of ascites not blood no internal injuries to the abdomen. X-ray of his right tib-fib showed no chronic bony changes secondary to the chronic wound there and showed no fractures.  Patient's lactic acid was markedly elevated patient's temperature was low upon arrival some this could've been to the environmental exposure. Patient had a markedly elevated white count of 20,000 range. Based on this sepsis protocol was initiated. Patient was never hypotensive. Patient started on protocol antibiotics. And patient was started on the 3 L of fluid bolus. However when gently with it because of this age. And because he was never hypotensive never tachycardic.   Patient will require admission for concerns for the sepsis urinalysis is still pending C urinary tract infection not completely ruled out. Patient markedly improved from her breathing standpoint. Patient was eventually removed from the BiPAP by respiratory therapy and put on 100% nonrebreather for which she did well on. Hospitalist will probably restart BiPAP for the admission. Patient also given Solu-Medrol probably for the COPD exacerbation since CT was negative for any pneumonia.  Patient will be admitted to the ICU.   Final Clinical Impressions(s) / ED Diagnoses   Final diagnoses:  Sepsis, due to unspecified organism [facility])  Fall, initial encounter  COPD exacerbation (HCC)  Acute respiratory failure with hypoxia [facility])    New Prescriptions New Prescriptions   No medications on file     Vanetta Mulders, MD 07/14/17 [phone]

## 2017-07-15 ENCOUNTER — Inpatient Hospital Stay (HOSPITAL_COMMUNITY): Payer: Medicare Other

## 2017-07-15 ENCOUNTER — Encounter (HOSPITAL_COMMUNITY): Payer: Self-pay

## 2017-07-15 DIAGNOSIS — K219 Gastro-esophageal reflux disease without esophagitis: Secondary | ICD-10-CM

## 2017-07-15 DIAGNOSIS — R0902 Hypoxemia: Secondary | ICD-10-CM

## 2017-07-15 DIAGNOSIS — R17 Unspecified jaundice: Secondary | ICD-10-CM

## 2017-07-15 DIAGNOSIS — I2782 Chronic pulmonary embolism: Secondary | ICD-10-CM

## 2017-07-15 DIAGNOSIS — R918 Other nonspecific abnormal finding of lung field: Secondary | ICD-10-CM

## 2017-07-15 DIAGNOSIS — N182 Chronic kidney disease, stage 2 (mild): Secondary | ICD-10-CM

## 2017-07-15 DIAGNOSIS — I272 Pulmonary hypertension, unspecified: Secondary | ICD-10-CM

## 2017-07-15 DIAGNOSIS — E1122 Type 2 diabetes mellitus with diabetic chronic kidney disease: Secondary | ICD-10-CM

## 2017-07-15 DIAGNOSIS — J9601 Acute respiratory failure with hypoxia: Secondary | ICD-10-CM | POA: Diagnosis present

## 2017-07-15 HISTORY — DX: Pulmonary hypertension, unspecified: I27.20

## 2017-07-15 LAB — CBC WITH DIFFERENTIAL/PLATELET
BASOS PCT: 0 %
Basophils Absolute: 0 10*3/uL (ref 0.0–0.1)
EOS ABS: 0 10*3/uL (ref 0.0–0.7)
Eosinophils Relative: 0 %
HCT: 40.1 % (ref 39.0–52.0)
Hemoglobin: 13 g/dL (ref 13.0–17.0)
Lymphocytes Relative: 3 %
Lymphs Abs: 0.5 10*3/uL — ABNORMAL LOW (ref 0.7–4.0)
MCH: 29.7 pg (ref 26.0–34.0)
MCHC: 32.4 g/dL (ref 30.0–36.0)
MCV: 91.6 fL (ref 78.0–100.0)
MONO ABS: 1.5 10*3/uL — AB (ref 0.1–1.0)
MONOS PCT: 11 %
Neutro Abs: 12.2 10*3/uL — ABNORMAL HIGH (ref 1.7–7.7)
Neutrophils Relative %: 86 %
Platelets: 154 10*3/uL (ref 150–400)
RBC: 4.38 MIL/uL (ref 4.22–5.81)
RDW: 17.5 % — AB (ref 11.5–15.5)
WBC: 14.2 10*3/uL — ABNORMAL HIGH (ref 4.0–10.5)

## 2017-07-15 LAB — BLOOD GAS, ARTERIAL
ACID-BASE DEFICIT: 2.9 mmol/L — AB (ref 0.0–2.0)
BICARBONATE: 22.1 mmol/L (ref 20.0–28.0)
Drawn by: 221791
O2 Content: 4 L/min
O2 Saturation: 95 %
PATIENT TEMPERATURE: 37
PO2 ART: 82.3 mmHg — AB (ref 83.0–108.0)
pCO2 arterial: 36 mmHg (ref 32.0–48.0)
pH, Arterial: 7.388 (ref 7.350–7.450)

## 2017-07-15 LAB — BASIC METABOLIC PANEL
Anion gap: 10 (ref 5–15)
BUN: 44 mg/dL — AB (ref 6–20)
CALCIUM: 8.8 mg/dL — AB (ref 8.9–10.3)
CO2: 24 mmol/L (ref 22–32)
CREATININE: 1.72 mg/dL — AB (ref 0.61–1.24)
Chloride: 101 mmol/L (ref 101–111)
GFR calc non Af Amer: 37 mL/min — ABNORMAL LOW (ref >60)
GFR, EST AFRICAN AMERICAN: 43 mL/min — AB (ref >60)
Glucose, Bld: 199 mg/dL — ABNORMAL HIGH (ref 65–99)
Potassium: 4 mmol/L (ref 3.5–5.1)
SODIUM: 135 mmol/L (ref 135–145)

## 2017-07-15 LAB — URINALYSIS, ROUTINE W REFLEX MICROSCOPIC
BACTERIA UA: NONE SEEN
BILIRUBIN URINE: NEGATIVE
GLUCOSE, UA: NEGATIVE mg/dL
KETONES UR: NEGATIVE mg/dL
NITRITE: NEGATIVE
PH: 5 (ref 5.0–8.0)
PROTEIN: 30 mg/dL — AB
Specific Gravity, Urine: 1.021 (ref 1.005–1.030)

## 2017-07-15 LAB — HEMOGLOBIN A1C
HEMOGLOBIN A1C: 7.9 % — AB (ref 4.8–5.6)
Mean Plasma Glucose: 180 mg/dL

## 2017-07-15 LAB — GLUCOSE, CAPILLARY
Glucose-Capillary: 167 mg/dL — ABNORMAL HIGH (ref 65–99)
Glucose-Capillary: 269 mg/dL — ABNORMAL HIGH (ref 65–99)
Glucose-Capillary: 240 mg/dL — ABNORMAL HIGH (ref 65–99)

## 2017-07-15 LAB — LACTIC ACID, PLASMA: LACTIC ACID, VENOUS: 2.7 mmol/L — AB (ref 0.5–1.9)

## 2017-07-15 LAB — BRAIN NATRIURETIC PEPTIDE: B Natriuretic Peptide: 824 pg/mL — ABNORMAL HIGH (ref 0.0–100.0)

## 2017-07-15 MED ORDER — GUAIFENESIN ER 600 MG PO TB12
1200.0000 mg | ORAL_TABLET | Freq: Two times a day (BID) | ORAL | Status: DC
  Administered 2017-07-15 – 2017-07-21 (×13): 1200 mg via ORAL
  Filled 2017-07-15 (×12): qty 2

## 2017-07-15 MED ORDER — FLUTICASONE PROPIONATE 50 MCG/ACT NA SUSP
2.0000 | Freq: Every day | NASAL | Status: DC
  Administered 2017-07-15 – 2017-07-21 (×7): 2 via NASAL
  Filled 2017-07-15: qty 16

## 2017-07-15 MED ORDER — LORATADINE 10 MG PO TABS
10.0000 mg | ORAL_TABLET | Freq: Every day | ORAL | Status: DC
  Administered 2017-07-15 – 2017-07-21 (×7): 10 mg via ORAL
  Filled 2017-07-15 (×7): qty 1

## 2017-07-15 MED ORDER — HEPARIN BOLUS VIA INFUSION
4000.0000 [IU] | Freq: Once | INTRAVENOUS | Status: AC
  Administered 2017-07-15: 4000 [IU] via INTRAVENOUS
  Filled 2017-07-15: qty 4000

## 2017-07-15 MED ORDER — METHYLPREDNISOLONE SODIUM SUCC 125 MG IJ SOLR
60.0000 mg | Freq: Two times a day (BID) | INTRAMUSCULAR | Status: DC
  Administered 2017-07-15 (×2): 60 mg via INTRAVENOUS
  Filled 2017-07-15 (×2): qty 2

## 2017-07-15 MED ORDER — HEPARIN (PORCINE) IN NACL 100-0.45 UNIT/ML-% IJ SOLN
1400.0000 [IU]/h | INTRAMUSCULAR | Status: DC
  Administered 2017-07-15: 1400 [IU]/h via INTRAVENOUS
  Filled 2017-07-15: qty 250

## 2017-07-15 MED ORDER — BUDESONIDE 0.25 MG/2ML IN SUSP: 0.2500 mg | Freq: Two times a day (BID) | RESPIRATORY_TRACT | Status: DC

## 2017-07-15 MED ORDER — INSULIN GLARGINE 100 UNIT/ML ~~LOC~~ SOLN
5.0000 [IU] | SUBCUTANEOUS | Status: DC
  Administered 2017-07-15 – 2017-07-16 (×2): 5 [IU] via SUBCUTANEOUS
  Filled 2017-07-15 (×2): qty 0.05

## 2017-07-15 MED ORDER — BUDESONIDE 0.5 MG/2ML IN SUSP
0.5000 mg | Freq: Two times a day (BID) | RESPIRATORY_TRACT | Status: DC
  Administered 2017-07-15 – 2017-07-21 (×12): 0.5 mg via RESPIRATORY_TRACT
  Filled 2017-07-15 (×12): qty 2

## 2017-07-15 MED ORDER — TECHNETIUM TC 99M DIETHYLENETRIAME-PENTAACETIC ACID
30.0000 | Freq: Once | INTRAVENOUS | Status: AC | PRN
  Administered 2017-07-15: 30 via INTRAVENOUS

## 2017-07-15 MED ORDER — IPRATROPIUM-ALBUTEROL 0.5-2.5 (3) MG/3ML IN SOLN: 3.0000 mL | RESPIRATORY_TRACT | Status: DC | PRN

## 2017-07-15 MED ORDER — TECHNETIUM TO 99M ALBUMIN AGGREGATED
4.0000 | Freq: Once | INTRAVENOUS | Status: AC | PRN
  Administered 2017-07-15: 4 via INTRAVENOUS

## 2017-07-15 MED ORDER — HEPARIN SODIUM (PORCINE) 5000 UNIT/ML IJ SOLN
5000.0000 [IU] | Freq: Three times a day (TID) | INTRAMUSCULAR | Status: DC
  Administered 2017-07-15 – 2017-07-21 (×16): 5000 [IU] via SUBCUTANEOUS
  Filled 2017-07-15 (×16): qty 1

## 2017-07-15 NOTE — Progress Notes (Signed)
PROGRESS NOTE    Austin [name]  OZH:[id number] DOB: [birth year] DOA: 07/13/2017 PCP: [facility]    Brief Narrative:  76 year old male past oral history of COPD, diastolic heart failure and chronic atrial fibrillation brought into the emergency room on 8/4 for several days of progressively worsening shortness of breath. On day of admission, patient had been outside to feed his dog, but stopped in the mother and was unable to get up by himself or with Mariel Kansky assistance. EMS was called and patient was reportedly hypoxic with decreased level of consciousness. Patient was found to be in sepsis and acute respiratory failure requiring BiPAP. Brought into the hospitalist service. BNP also noted to be elevated at 1769. Patient started on IV fluids, antibiotics and admitted to the hospitalist service in the stepdown unit.  Assessment & Plan:   Principal Problem:   Acute respiratory failure with hypoxia (HCC) Active Problems:   Sepsis due to undetermined organism with acute respiratory failure (HCC)   Type 2 diabetes mellitus (HCC)   HLD (hyperlipidemia)   GERD (gastroesophageal reflux disease)   Essential hypertension   Chronic pulmonary embolism (HCC)   COPD exacerbation (HCC)   Ascending aortic aneurysm (HCC)   Elevated bilirubin   Left nephrolithiasis   Lactic acidosis   Moderate to severe pulmonary hypertension (HCC)--per 2-D echo 07/14/2017   Mass of lung  #1 acute respiratory failure with hypoxia Patient presenting with acute respiratory failure with hypoxia initially requiring BiPAP. Patient now on high flow nasal cannula at 4 L and sitting up in the chair. Unknown etiology. Patient with prior history of DVT and PE and not on anticoagulation prior to admission and patient cannot distinctly tell me why not. Patient states he missed his appointment with his doctor. Patient also with COPD with ongoing tobacco use. 2-D echo which was done with a EF of 65-70%  however dynamic obstruction at rest and mid cavity with severely dilated right ventricular cavity size, systolic function moderately to severely reduced on the right which is worse than previous 2-D echo and worsening pulmonary artery systolic pressure with a PA peak pressure of 81 mmHg. Patient also noted to have labile blood pressure with systolics in the 62X overnight however on IV fluids systolic blood pressure now in the 115. Will repeat chest x-ray this morning. Check ABG. Check a VQ scan as patient with chronic kidney disease. Check lower extremity Dopplers. Change IV Solu-Medrol to 60 mg every 12 hours. Discontinue dulera, and place on Pulmicort. Add Mucinex, Claritin. Continue empiric IV antibiotics. Will place empirically on full anticoagulation with IV heparin while awaiting Doppler results and VQ scan results. Patient does have a prior history of DVT and PE and should likely be on anticoagulation however was not on anticoagulation prior to admission. Follow. Consult with pulmonary.  #2 sepsis due to undetermined organism with acute respiratory failure with hypoxia On admission patient met criteria for sepsis with a lactic acidosis of 7.5 which is trended down however still elevated at 2.7. Patient also noted to have a leukocytosis with a white count of 21.2, concern for respiratory source with end organ failure. Patient responded to IV fluids. Patient currently afebrile. Leukocytosis trending down slowly. Patient has been pancultured results pending. MRSA PCR negative. Continue empiric IV vancomycin and IV Zosyn. Blood cultures with no growth to date in the next 1-2 days will discontinue IV vancomycin. Follow.  #3 abnormal 2-D echo 2-D echo with EF of 65-70%, dynamic obstruction addressed the mid  cavity with grade 1 diastolic dysfunction, severely dilated right ventricular cavity size and moderately to severely reduced right ventricular function as well as right atrium severely dilated and  severely increased PA peak pressure of 81 mmHg. Patient did have some labral blood pressure with some systolics in the 10G and 26R overnight however on IV fluids systolic blood pressure now 115. 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 Patient on on anticoagulation unknown reason. Will check a VQ scan and lower extremity Dopplers. Placed empirically on IV heparin.  #4 prior history of PE and DVT Patient not on anticoagulation prior to admission. Patient states did not follow-up with one of his doctors and thus the reason why he is not on anticoagulation at this time. Due to patient's respiratory status and presentation and concern for DVT and PE will check a VQ scan. Check lower extremity Dopplers. Placed empirically on IV heparin and subsequently transitioned to oral anticoagulation if continued improvement during this hospitalization.  #5 severe pulmonary hypertension Pulmonary consult. Check a VQ scan. Treat COPD. Will likely need outpatient follow-up.  #6 diabetes mellitus type 2 Discontinue glipizide. CBGs have ranged from186-240. Patient was on IV Solu-Medrol. Place on Lantus 5 units daily. Continue sliding scale insulin. Check a hemoglobin A1c. Follow.  #7 gastroesophageal reflux disease PPI.  #8 hypertension Blood pressure is somewhat liable. Hold Lopressor this morning. Follow.  #9 COPD exacerbation Patient on home O2 as needed. Patient complaining of tightness this morning. No wheezing noted on examination. Decrease Solu-Medrol to 60 mg IV every 12 hours. Discontinue dulera. Place on Pulmicort, Claritin, Flonase, Mucinex. Continue scheduled nebulizers. Continue empiric IV antibiotics of vancomycin and Zosyn. Follow.  #10 chronic atrial fibrillation CHA2DS2Vasc score 6 Patient currently rate controlled on metoprolol. Patient needs to be on anticoagulation however not on any anticoagulation. Unable to see any reason in the chart for  contraindication indicated for long-term anticoagulation. Due to problem #1 patient be placed on anticoagulation with IV heparin and closely monitored.  #11 acute on chronic vs chronic diastolic heart failure Patient noted to have elevated BNP on admission however patient also with chronic kidney disease stage II. Patient also noted to be hypoxic and concerns for pleural effusion on chest x-ray. Repeat chest x-ray. 2-D echo with a normal EF however grade 2 diastolic dysfunction noted with severe pulmonary hypertension, moderately to severely reduced right ventricular function, dilated right atrium, severely dilated right ventricular cavity size. Due to liable blood pressure diuretics on hold. Strict I's and O's. Daily weights.  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 Follow.  #13 incidental aortic ascending aneurysm Noted on CT. Outpatient follow-up.  #14 stable spiculated masses right middle lobe and right upper lobe Per CT scan chest without contrast 07/13/2017 which stated that given the long-term liability benign processes likely favored. Patient will need ongoing outpatient follow-up with pulmonary.    DVT prophylaxis: Heparin Code Status: Full Family Communication: Updated patient. No family at bedside. Disposition Plan: Remain in step down unit, while hypoxic and on high flow nasal cannula.   Consultants:   Pulmonary pending  Procedures:   Chest x-ray 07/13/2017  Plain films of the right tib-fib 07/13/2017  CT abdomen and pelvis 07/13/2017  CT chest without contrast 07/13/2017  2-D echo 07/14/2017--- EF 65-70%, dynamic obstruction at rest and mid cavity, grade 1 diastolic dysfunction, severely dilated right ventricular cavity size, systolic function moderately to severely reduced, right atrium severely dilated, pulmonary  artery systolic pressure severely increased PA peak pressure of 81 mmHg.  Antimicrobials:   IV Zosyn 07/13/2017  IV vancomycin  07/13/2017   Subjective: Patient sitting up in chair. Patient complaining of chest tightness. Patient complaining of cough. Patient complaining of some shortness of breath with minimal exertion. Patient sitting in chair. Patient states however some improvement since admission. Patient on high flow nasal cannula 4 L with O2 sats in the mid to high 80s.  Objective: Vitals:   07/15/17 0140 07/15/17 0400 07/15/17 0500 07/15/17 0708  BP:      Pulse:    62  Resp:    20  Temp:  98.1 F (36.7 C)  97.8 F (36.6 C)  TempSrc:  Oral  Oral  SpO2: 94%   (!) 85%  Weight:   89.9 kg (198 lb 3.1 oz)   Height:        Intake/Output Summary (Last 24 hours) at 07/15/17 0943 Last data filed at 07/15/17 0906  Gross per 24 hour  Intake          3987.92 ml  Output             1125 ml  Net          2862.92 ml   Filed Weights   07/13/17 1841 07/14/17 0000 07/15/17 0500  Weight: 86.2 kg (190 lb) 89.8 kg (197 lb 15.6 oz) 89.9 kg (198 lb 3.1 oz)    Examination:  General exam: Sitting up in chair. Respiratory system: Coarse BS. Poor to fair air movement. No wheezing. Minimal use of accessory muscles of respiration Cardiovascular system: S1 & S2 heard, RRR. No JVD, murmurs, rubs, gallops or clicks.1+ right lower extremity edema. Trace left lower extremity edema. Gastrointestinal system: Abdomen is nondistended, soft and nontender. No organomegaly or masses felt. Normal bowel sounds heard. Central nervous system: Alert and oriented. No focal neurological deficits. Extremities: Symmetric 5 x 5 power. Skin: Right lower extremity with some erythema, 1+ edema, small ulceration noted.  Psychiatry: Judgement and insight appear normal. Mood & affect appropriate.     Data Reviewed: I have personally reviewed following labs and imaging studies  CBC:  Recent Labs Lab 07/13/17 1901 07/14/17 0443 07/15/17 0446  WBC 21.2* 18.5* 14.2*  NEUTROABS 18.8* 17.2* 12.2*  HGB 15.4 14.4 13.0  HCT 49.5 45.5 40.1    MCV 95.2 92.7 91.6  PLT 159 161 440   Basic Metabolic Panel:  Recent Labs Lab 07/13/17 1901 07/14/17 0443 07/15/17 0446  NA 137 138 135  K 4.8 4.6 4.0  CL 103 103 101  CO2 16* 24 24  GLUCOSE 130* 160* 199*  BUN 33* 39* 44*  CREATININE 1.90* 1.85* 1.72*  CALCIUM 9.7 9.6 8.8*   GFR: Estimated Creatinine Clearance: 40.7 mL/min (A) (by C-G formula based on SCr of 1.72 mg/dL (H)). Liver Function Tests:  Recent Labs Lab 07/13/17 1901 07/14/17 0443  AST 46* 37  ALT 15* 12*  ALKPHOS 76 71  BILITOT 2.1* 1.9*  PROT 6.8 6.0*  ALBUMIN 3.6 3.3*   No results for input(s): LIPASE, AMYLASE in the last 168 hours. No results for input(s): AMMONIA in the last 168 hours. Coagulation Profile: No results for input(s): INR, PROTIME in the last 168 hours. Cardiac Enzymes: No results for input(s): CKTOTAL, CKMB, CKMBINDEX, TROPONINI in the last 168 hours. BNP (last 3 results) No results for input(s): PROBNP in the last 8760 hours. HbA1C: No results for input(s): HGBA1C in the last 72 hours. CBG:  Recent  Labs Lab 07/14/17 0746 07/14/17 1130 07/14/17 1622 07/14/17 2121  GLUCAP 162* 186* 279* 240*   Lipid Profile: No results for input(s): CHOL, HDL, LDLCALC, TRIG, CHOLHDL, LDLDIRECT in the last 72 hours. Thyroid Function Tests: No results for input(s): TSH, T4TOTAL, FREET4, T3FREE, THYROIDAB in the last 72 hours. Anemia Panel: No results for input(s): VITAMINB12, FOLATE, FERRITIN, TIBC, IRON, RETICCTPCT in the last 72 hours. Sepsis Labs:  Recent Labs Lab 07/14/17 1027 07/14/17 1509 07/14/17 2052 07/15/17 0446  LATICACIDVEN 2.4* 3.3* 2.6* 2.7*    Recent Results (from the past 240 hour(s))  Blood Culture (routine x 2)     Status: None (Preliminary result)   Collection Time: 07/13/17  9:00 PM  Result Value Ref Range Status   Specimen Description BLOOD RIGHT FOREARM  Final   Special Requests   Final    Blood Culture results may not be optimal due to an inadequate volume  of blood received in culture bottles   Culture NO GROWTH 2 DAYS  Final   Report Status PENDING  Incomplete  Blood Culture (routine x 2)     Status: None (Preliminary result)   Collection Time: 07/13/17  9:00 PM  Result Value Ref Range Status   Specimen Description BLOOD LEFT FOREARM  Final   Special Requests   Final    Blood Culture results may not be optimal due to an inadequate volume of blood received in culture bottles   Culture NO GROWTH 2 DAYS  Final   Report Status PENDING  Incomplete  MRSA PCR Screening     Status: None   Collection Time: 07/14/17 12:46 AM  Result Value Ref Range Status   MRSA by PCR NEGATIVE NEGATIVE Final    Comment:        The GeneXpert MRSA Assay (FDA approved for NASAL specimens only), is one component of a comprehensive MRSA colonization surveillance program. It is not intended to diagnose MRSA infection nor to guide or monitor treatment for MRSA infections.          Radiology Studies: Ct Abdomen Pelvis Wo Contrast  Result Date: 07/13/2017 CLINICAL DATA:  Abnormal finding on chest CT prompting recommendation for abdominal CT. Small amount of fluid around the spleen. Recently admitted with sepsis, on antibiotics. EXAM: CT ABDOMEN AND PELVIS WITHOUT CONTRAST TECHNIQUE: Multidetector CT imaging of the abdomen and pelvis was performed following the standard protocol without IV contrast. COMPARISON:  Chest CT from earlier today. CT abdomen dated 04/11/2015. FINDINGS: Lower chest: Bibasilar consolidations, as also described on chest CT from earlier today, most likely atelectasis. Hepatobiliary: No focal liver abnormality is seen. Status post cholecystectomy. No biliary dilatation. Pancreas: Unremarkable. No pancreatic ductal dilatation or surrounding inflammatory changes. Spleen: Normal in size without focal abnormality. Adrenals/Urinary Tract: Bulbous left adrenal gland appears stable. Right adrenal gland is unremarkable. Multiple left renal stones, largest  measuring 1.5 cm. Additional 15 mm stone within the proximal left ureter with associated mild hydronephrosis. No right renal stone or hydronephrosis. Bladder is unremarkable, partially decompressed. Stomach/Bowel: Bowel is normal in caliber. Left inguinal hernia contains a portion of the left colon but there is no associated obstruction or inflammation at the hernia site. Moderate amount of stool throughout the nondistended colon. No bowel wall thickening or evidence of bowel wall inflammation seen. Vascular/Lymphatic: Aortic atherosclerosis. No enlarged abdominal or pelvic lymph nodes. Reproductive: Prostate gland is mildly prominent in size causing slight mass effect on the bladder base. Other: Small amount of free fluid in the left upper quadrant  adjacent to the spleen, simple fluid density. Additional trace free fluid/fluid stranding in the central abdomen. No evidence of active hemorrhage. No evidence of abscess collection. No free intraperitoneal air. Musculoskeletal: Degenerative changes in the lower lumbar spine, mild to moderate in degree. No acute or suspicious osseous finding. IMPRESSION: 1. Small amount of free fluid in the left upper quadrant, adjacent to the spleen, simple fluid density consistent with simple ascites. No evidence of acute hemorrhage in the abdomen or pelvis. Adjacent spleen is unremarkable, although characterization limited by the lack of IV contrast. 2. 15 mm stone within the proximal left ureter, just distal to the left UPJ, causing mild hydronephrosis. 3. Left nephrolithiasis. 4. Left inguinal hernia which contains a loop of the left colon, without associated bowel obstruction or inflammation. 5. Aortic atherosclerosis. 6. Bibasilar consolidations, likely atelectasis. Electronically Signed   By: Franki Cabot M.D.   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 Pt fell while outside today. Almost unresponsive  when brought to ER. Unable to elaborate on pain or would history. Diabetic. EXAM: RIGHT TIBIA AND FIBULA - 2 VIEW COMPARISON:  None. FINDINGS: No osseous fracture or dislocation seen. No acute or suspicious osseous lesion. No destructive change to suggest osteomyelitis. Bandages overlying the mid tib-fib, with questionable underlying soft tissue edema. Extensive atherosclerotic calcifications. IMPRESSION: 1. No osseous fracture or dislocation. No evidence of osteomyelitis seen. 2. Bandages overlying the mid tib-fib, presumably related to given history of chronic wound. Electronically Signed   By: Franki Cabot M.D.   On: 07/13/2017 20:42   Ct Chest Wo Contrast  Result Date: 07/13/2017 CLINICAL DATA:  Pt fell today while outside, also C/o difficulty breathing x2 days with productive cough. White thick sputum noted. Upon arrival EMS states he was "almost unresponsive" upon arrival, low O2 sats EXAM: CT CHEST WITHOUT CONTRAST TECHNIQUE: Multidetector CT imaging of the chest was performed following the standard protocol without IV contrast. COMPARISON:  07/13/2017 chest x-ray FINDINGS: Cardiovascular: Heart size is prominent. Trace pericardial effusion. Extensive coronary artery calcifications. There is atherosclerotic calcification of the thoracic aorta. Ascending aorta is 4.1 cm. Aortic arch is 3.9 cm. Main pulmonary artery is prominent, 4.1 cm. Mediastinum/Nodes: The visualized portion of the thyroid gland has a normal appearance. No mediastinal, hilar, or axillary adenopathy. Esophagus is normal in appearance. Lungs/Pleura: There are extensive paraseptal and panlobular emphysematous changes throughout the lungs, primarily involving the lung apices. Within the right middle lobe there is a spiculated mass measuring 12 mm on image 103 of series 4. A spiculated mass measures 11 mm in the right upper lobe on image 60 of series 4, adjacent to an area of scarring. These nodules appear stable compared with prior exams  including 2014. Trace bilateral pleural effusions. There is bibasilar dependent atelectasis versus consolidation or contusion, left greater than right. Upper Abdomen: Within the upper abdomen, a small amount of fluid is identified around the spleen, not further characterized. Consider further evaluation with CT of the abdomen and pelvis. Musculoskeletal: Degenerative changes are seen in thoracic spine. No acute vertebral fracture. Sternum is intact. IMPRESSION: 1. Fluid within the upper abdomen warranting further evaluation. Consider CT of the abdomen and pelvis with intravenous contrast unless it is contraindicated. 2. Stable spiculated masses in the right middle lobe and for right upper lobe, measuring 12 mm and 11 mm respectively. Given the long-term stability, benign process is favored. 3. Advanced emphysema.  Emphysema (ICD10-J43.9). 4.  Bibasilar dependent atelectasis/consolidation, or contusion. 5. Extensive coronary artery disease. 6.  Aortic Atherosclerosis (ICD10-I70.0). 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 Aortic aneurysm NOS (ICD10-I71.9). Recommend annual imaging followup by CTA or MRA. This recommendation follows 2010 ACCF/AHA/AATS/ACR/ASA/SCA/SCAI/SIR/STS/SVM Guidelines for the Diagnosis and Management of Patients with Thoracic Aortic Disease. Circulation. 2010; 121: e266-e369 8. Enlarged pulmonary arteries compatible with pulmonary arterial hypertension. These results were called by telephone at the time of interpretation on 07/13/2017 at 9:17 pm to Dr. Fredia Sorrow , who verbally acknowledged these results. Electronically Signed   By: Nolon Nations M.D.   On: 07/13/2017 21:18   Dg Chest Port 1 View  Result Date: 07/13/2017 CLINICAL DATA:  76 y/o M; shortness of breath with fall. Difficulty breathing with productive cough for 2 days. EXAM: PORTABLE CHEST 1 VIEW COMPARISON:  12/26/2016 chest radiograph FINDINGS: Stable cardiomegaly given projection and technique. Pulmonary venous  hypertension and prominent interstitial markings probably representing interstitial pulmonary edema. No focal consolidation. No pleural effusion. No acute osseous abnormality is evident. IMPRESSION: Cardiomegaly and mild interstitial pulmonary edema. No focal consolidation identified. Electronically Signed   By: Kristine Garbe M.D.   On: 07/13/2017 19:15        Scheduled Meds: . aspirin EC  81 mg Oral Daily  . budesonide (PULMICORT) nebulizer solution  0.25 mg Nebulization BID  . fluticasone  2 spray Each Nare Daily  . guaiFENesin  1,200 mg Oral BID  . heparin  5,000 Units Subcutaneous Q8H  . insulin aspart  0-15 Units Subcutaneous TID WC  . insulin glargine  5 Units Subcutaneous BH-q7a  . ipratropium-albuterol  3 mL Nebulization Q6H  . loratadine  10 mg Oral Daily  . methylPREDNISolone (SOLU-MEDROL) injection  60 mg Intravenous Q12H  . metoprolol tartrate  12.5 mg Oral BID  . pantoprazole  40 mg Oral Daily  . pravastatin  40 mg Oral q1800   Continuous Infusions: . sodium chloride 1 mL (07/15/17 0912)  . piperacillin-tazobactam (ZOSYN)  IV 3.375 g (07/15/17 0535)  . sodium chloride    . vancomycin Stopped (07/14/17 2208)  . vancomycin Stopped (07/13/17 2139)     LOS: 2 days    Time spent: 74 mins    Oswin Griffith, MD [facility] Pager [phone]  If 7PM-7AM, please contact night-coverage [email or web address] Password [facility] 07/15/2017, 9:43 AM

## 2017-07-15 NOTE — Progress Notes (Signed)
ANTICOAGULATION CONSULT NOTE - Initial Consult  Pharmacy Consult for heparin Indication: pulmonary embolus, r/o PE  Allergies  Allergen Reactions  . Contrast Media [Iodinated Diagnostic Agents] Nausea And Vomiting  . Other Other (See Comments)    Cannot have "high doses of anesthesia because of his lungs"    Patient Measurements: Height: 6' (182.9 cm) Weight: 198 lb 3.1 oz (89.9 kg) IBW/kg (Calculated) : 77.6 Heparin Dosing Weight: 89.8 kg   Vital Signs: Temp: 97.8 F (36.6 C) (08/05 0708) Temp Source: Oral (08/05 0708) BP: 112/73 (08/05 0942) Pulse Rate: 69 (08/05 0942)  Labs:  Recent Labs  07/13/17 1901 07/14/17 0443 07/15/17 0446  HGB 15.4 14.4 13.0  HCT 49.5 45.5 40.1  PLT 159 161 154  CREATININE 1.90* 1.85* 1.72*    Estimated Creatinine Clearance: 40.7 mL/min (A) (by C-G formula based on SCr of 1.72 mg/dL (H)).   Medical History: Past Medical History:  Diagnosis Date  . Asthma   . Atrial fibrillation (HCC)   . CHF (congestive heart failure) (HCC)   . COPD (chronic obstructive pulmonary disease) (HCC)   . Diabetes mellitus without complication (HCC)   . DVT (deep venous thrombosis) (HCC)   . Gastric ulcer   . GERD (gastroesophageal reflux disease)   . Hypercholesteremia   . Hypertension   . PE (pulmonary embolism)     Medications:  Prescriptions Prior to Admission  Medication Sig Dispense Refill Last Dose  . albuterol (PROAIR HFA) 108 (90 Base) MCG/ACT inhaler Inhale 1-2 puffs into the lungs every 6 (six) hours as needed for wheezing or shortness of breath.   Past Week at Unknown time  . albuterol (PROVENTIL) (2.5 MG/3ML) 0.083% nebulizer solution Take 3 mLs (2.5 mg total) by nebulization every 6 (six) hours as needed for wheezing or shortness of breath. 75 mL 12 Past Week at Unknown time  . aspirin EC 81 MG tablet Take 81 mg by mouth daily.   07/13/2017 at Unknown time  . budesonide-formoterol (SYMBICORT) 160-4.5 MCG/ACT inhaler Inhale 2 puffs into  the lungs 2 (two) times daily as needed (for shortness of breath).    Past Week at Unknown time  . furosemide (LASIX) 40 MG tablet Take 1 tablet (40 mg total) by mouth 2 (two) times daily. 60 tablet 5 07/13/2017 at Unknown time  . glipiZIDE (GLUCOTROL) 5 MG tablet Take 5 mg by mouth 2 (two) times daily.   07/13/2017 at Unknown time  . lisinopril (PRINIVIL,ZESTRIL) 40 MG tablet Take 40 mg by mouth daily.   07/13/2017 at Unknown time  . lovastatin (MEVACOR) 40 MG tablet Take 40 mg by mouth at bedtime. Pt should take with food.    07/12/2017 at Unknown time  . metFORMIN (GLUCOPHAGE) 500 MG tablet Take 500 mg by mouth 2 (two) times daily with a meal.   07/13/2017 at Unknown time  . metoprolol tartrate (LOPRESSOR) 25 MG tablet Take 12.5 mg by mouth 2 (two) times daily.    07/13/2017 at 730a  . omeprazole (PRILOSEC) 40 MG capsule Take 40 mg by mouth daily.   07/13/2017 at Unknown time  . polyethylene glycol (MIRALAX) packet Take 17 g by mouth daily as needed for moderate constipation. 30 each 0 unknown  . nitroGLYCERIN (NITROSTAT) 0.4 MG SL tablet Place 0.4 mg under the tongue every 5 (five) minutes x 3 doses as needed for chest pain. If no relief call 911 or go to emergency room.   unknown    Assessment: 76 yo man admitted with SOB and  h/o PE/DVT not on anticoagulation PTA to start IV heparin Goal of Therapy:  Heparin level 0.3-0.7 units/ml Monitor platelets by anticoagulation protocol: Yes   Plan:  Heparin 4000 unit bolus and drip at 1400 units/hr Check heparin level 8 hours after start Daily HL and CBC while on heparin Monitor for bleeding complications Dc sq heparin  Tashanti Dalporto Poteet 07/15/2017,10:15 AM

## 2017-07-16 DIAGNOSIS — E785 Hyperlipidemia, unspecified: Secondary | ICD-10-CM

## 2017-07-16 DIAGNOSIS — N390 Urinary tract infection, site not specified: Secondary | ICD-10-CM

## 2017-07-16 DIAGNOSIS — A419 Sepsis, unspecified organism: Secondary | ICD-10-CM

## 2017-07-16 HISTORY — DX: Urinary tract infection, site not specified: N39.0

## 2017-07-16 LAB — LIPID PANEL
CHOL/HDL RATIO: 2.5 ratio
CHOLESTEROL: 103 mg/dL (ref 0–200)
HDL: 42 mg/dL (ref >40)
LDL Cholesterol: 50 mg/dL (ref 0–99)
Triglycerides: 54 mg/dL (ref <150)
VLDL: 11 mg/dL (ref 0–40)

## 2017-07-16 LAB — BASIC METABOLIC PANEL
Anion gap: 7 (ref 5–15)
BUN: 40 mg/dL — AB (ref 6–20)
CO2: 23 mmol/L (ref 22–32)
Calcium: 8.9 mg/dL (ref 8.9–10.3)
Chloride: 105 mmol/L (ref 101–111)
Creatinine, Ser: 1.48 mg/dL — ABNORMAL HIGH (ref 0.61–1.24)
GFR calc Af Amer: 52 mL/min — ABNORMAL LOW (ref >60)
GFR, EST NON AFRICAN AMERICAN: 45 mL/min — AB (ref >60)
Glucose, Bld: 298 mg/dL — ABNORMAL HIGH (ref 65–99)
POTASSIUM: 4.3 mmol/L (ref 3.5–5.1)
SODIUM: 135 mmol/L (ref 135–145)

## 2017-07-16 LAB — GLUCOSE, CAPILLARY
GLUCOSE-CAPILLARY: 152 mg/dL — AB (ref 65–99)
GLUCOSE-CAPILLARY: 215 mg/dL — AB (ref 65–99)
GLUCOSE-CAPILLARY: 272 mg/dL — AB (ref 65–99)
GLUCOSE-CAPILLARY: 170 mg/dL — AB (ref 65–99)
Glucose-Capillary: 152 mg/dL — ABNORMAL HIGH (ref 65–99)

## 2017-07-16 LAB — CBC
HEMATOCRIT: 42.1 % (ref 39.0–52.0)
HEMOGLOBIN: 13.4 g/dL (ref 13.0–17.0)
MCH: 29.3 pg (ref 26.0–34.0)
MCHC: 31.8 g/dL (ref 30.0–36.0)
MCV: 92.1 fL (ref 78.0–100.0)
Platelets: 150 10*3/uL (ref 150–400)
RBC: 4.57 MIL/uL (ref 4.22–5.81)
RDW: 17.6 % — ABNORMAL HIGH (ref 11.5–15.5)
WBC: 9.4 10*3/uL (ref 4.0–10.5)

## 2017-07-16 LAB — LACTIC ACID, PLASMA: Lactic Acid, Venous: 1.5 mmol/L (ref 0.5–1.9)

## 2017-07-16 LAB — MAGNESIUM: Magnesium: 1.8 mg/dL (ref 1.7–2.4)

## 2017-07-16 MED ORDER — FUROSEMIDE 10 MG/ML IJ SOLN
20.0000 mg | Freq: Two times a day (BID) | INTRAMUSCULAR | Status: DC
  Administered 2017-07-16 (×2): 20 mg via INTRAVENOUS
  Filled 2017-07-16 (×3): qty 2

## 2017-07-16 MED ORDER — INSULIN GLARGINE 100 UNIT/ML ~~LOC~~ SOLN
5.0000 [IU] | Freq: Once | SUBCUTANEOUS | Status: AC
  Administered 2017-07-16: 5 [IU] via SUBCUTANEOUS
  Filled 2017-07-16: qty 0.05

## 2017-07-16 MED ORDER — METHYLPREDNISOLONE SODIUM SUCC 125 MG IJ SOLR
60.0000 mg | Freq: Every day | INTRAMUSCULAR | Status: DC
  Administered 2017-07-16 – 2017-07-18 (×3): 60 mg via INTRAVENOUS
  Filled 2017-07-16 (×3): qty 2

## 2017-07-16 MED ORDER — INSULIN GLARGINE 100 UNIT/ML ~~LOC~~ SOLN
10.0000 [IU] | SUBCUTANEOUS | Status: DC
  Administered 2017-07-17 – 2017-07-18 (×2): 10 [IU] via SUBCUTANEOUS
  Filled 2017-07-16 (×3): qty 0.1

## 2017-07-16 MED ORDER — SILDENAFIL CITRATE 20 MG PO TABS
20.0000 mg | ORAL_TABLET | Freq: Three times a day (TID) | ORAL | Status: DC
  Administered 2017-07-16 – 2017-07-21 (×16): 20 mg via ORAL
  Filled 2017-07-16 (×25): qty 1

## 2017-07-16 NOTE — Progress Notes (Signed)
Pharmacy Antibiotic Note  Austin [name] is a 76 y.o. male admitted on 07/13/2017 with sepsis.  Pharmacy has been consulted for vancomycin and zosyn dosing.  Plan: Vancomycin 1250 IV every 24 hours.  Goal trough 15-20 mcg/mL.  Zosyn 3.375 gm IV q8 hours F/u renal function, cultures and clinical course Consider dc vanc as MRSA pcr (-)  Height: 6' (182.9 cm) Weight: 214 lb 11.7 oz (97.4 kg) IBW/kg (Calculated) : 77.6  Temp (24hrs), Avg:97.9 F (36.6 C), Min:97.3 F (36.3 C), Max:98.1 F (36.7 C)   Recent Labs Lab 07/13/17 1901  07/14/17 0443 07/14/17 0822 07/14/17 1509 07/14/17 2052 07/15/17 0446 07/16/17 0343  WBC 21.2*  --  18.5*  --   --   --  14.2* 9.4  CREATININE 1.90*  --  1.85*  --   --   --  1.72* 1.48*  LATICACIDVEN  --   < > 3.3* 2.4* 3.3* 2.6* 2.7* 1.5  < > = values in this interval not displayed.  Estimated Creatinine Clearance: 52.2 mL/min (A) (by C-G formula based on SCr of 1.48 mg/dL (H)).    Allergies  Allergen Reactions  . Contrast Media [Iodinated Diagnostic Agents] Nausea And Vomiting  . Other Other (See Comments)    Cannot have "high doses of anesthesia because of his lungs"     Thank you for allowing pharmacy to be a part of this patient's care.  Austin [name], Austin [name] 07/16/2017 9:31 AM

## 2017-07-16 NOTE — Plan of Care (Signed)
Problem: Safety: Goal: Ability to remain free from injury will improve Outcome: Progressing Patient uses call bed and ask for assistance when needed

## 2017-07-16 NOTE — Plan of Care (Signed)
Problem: Respiratory: Goal: Ability to maintain normal oxygenation or baseline will improve Outcome: Progressing Maintain O2 sat >90% on HFNC 4L Goal: Verbalizations of increased ease of respirations will increase Outcome: Progressing Patient wants to keep HOB elevated even while sleeping for ease of respirations, states sleep in recliner at home.

## 2017-07-16 NOTE — Progress Notes (Addendum)
PROGRESS NOTE    Austin [name]  GBT:[id number] DOB: Jan 15, 1941 DOA: 07/13/2017 PCP: [facility]    Brief Narrative:  76 year old male past oral history of COPD, diastolic heart failure and chronic atrial fibrillation brought into the emergency room on 8/4 for several days of progressively worsening shortness of breath. On day of admission, patient had been outside to feed his dog, but stopped in the mother and was unable to get up by himself or with Mariel Kansky assistance. EMS was called and patient was reportedly hypoxic with decreased level of consciousness. Patient was found to be in sepsis and acute respiratory failure requiring BiPAP. Brought into the hospitalist service. BNP also noted to be elevated at 1769. Patient started on IV fluids, antibiotics and admitted to the hospitalist service in the stepdown unit.  Assessment & Plan:   Principal Problem:   Acute respiratory failure with hypoxia (HCC) Active Problems:   Sepsis due to undetermined organism with acute respiratory failure (HCC)   Type 2 diabetes mellitus (HCC)   HLD (hyperlipidemia)   GERD (gastroesophageal reflux disease)   Essential hypertension   Chronic pulmonary embolism (HCC)   COPD exacerbation (HCC)   Ascending aortic aneurysm (HCC)   Elevated bilirubin   Left nephrolithiasis   Lactic acidosis   Moderate to severe pulmonary hypertension (HCC)--per 2-D echo 07/14/2017   Mass of lung  #1 acute respiratory failure with hypoxia/volume overload Patient presenting with acute respiratory failure with hypoxia initially requiring BiPAP. Patient now on high flow nasal cannula at 3 L and sitting up in the chair. Unknown etiology. Patient with prior history of DVT and PE and not on anticoagulation prior to admission due to history of GI bleed.  Patient also with COPD with ongoing tobacco use. 2-D echo which was done with a EF of 65-70% however dynamic obstruction at rest and mid cavity with  severely dilated right ventricular cavity size, systolic function moderately to severely reduced on the right which is worse than previous 2-D echo and worsening pulmonary artery systolic pressure with a PA peak pressure of 81 mmHg. Patient also noted to have labile blood pressure with systolics in the 71G the night of 07/14/2017, however on IV fluids systolic blood pressure improved. IV fluids have been saline locked. VQ scan negative for acute PE. Lower extremity Dopplers negative for DVT. ABG had a pH of 7.38, PCO2 of 36, PO2 of 82, bicarbonate of 22. Chest x-ray done 07/15/2017 with small pleural effusions that have mildly increased from 2 days ago, emphysema, chronic cardiomegaly, large pulmonary artery suggesting hypertension. Change IV Solu-Medrol to 60 mg daily. Discontinued dulera, and placed on Pulmicort. Continue Mucinex, Claritin. Continue empiric IV antibiotics.  Will place on Lasix 20 mg IV every 12 hours and follow urine output, daily weights, hypoxia due to concerns for volume overload. Pulmonary has been consulted and appreciate input and recommendations.   #2 sepsis due to undetermined organism with acute respiratory failure with hypoxia/UTI On admission patient met criteria for sepsis with a lactic acidosis of 7.5 which is trended down however still elevated at 2.7. Patient also noted to have a leukocytosis with a white count of 21.2, concern for respiratory source with end organ failure. Patient responded to IV fluids. Patient currently afebrile. Leukocytosis trending down slowly. Patient has been pancultured results pending. MRSA PCR negative. Urinalysis done 07/15/2017 worrisome for UTI. Urine cultures pending. Continue empiric IV Zosyn. Discontinue IV vancomycin.  #3 abnormal 2-D echo 2-D echo with EF of 65-70%,  dynamic obstruction addressed the mid cavity with grade 1 diastolic dysfunction, severely dilated right ventricular cavity size and moderately to severely reduced right  ventricular function as well as right atrium severely dilated and severely increased PA peak pressure of 81 mmHg. Patient did have some labile blood pressure with some systolics in the 20U and 54Y overnight however on IV fluids systolic blood pressure improved . 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 Patient not anticoagulation candidate due to history of GI Bleed.  #4 prior history of PE and DVT Patient not on anticoagulation prior to admission. Patient states did not follow-up with one of his doctors and thus the reason why he is not on anticoagulation at this time. Due to patient's respiratory status and presentation and concern for DVT and PE  VQ scan was checked which was negative for pulmonary emboli. Lower extremity Dopplers were also negative for DVT. IV heparin has been discontinued as been noted that patient had a prior history of GI bleed while on anticoagulation per family.   #5 severe pulmonary hypertension VQ scan negative for acute PE. Pulmonary consult.Treat COPD. Will likely need outpatient follow-up.  #6 diabetes mellitus type 2 Discontinue glipizide. CBGs have ranged from215-269. Patient was on IV Solu-Medrol. Increase Lantus to 10 units daily. Continue sliding scale insulin. Hemoglobin A1c = 7.9. Follow.  #7 gastroesophageal reflux disease PPI.  #8 hypertension Blood pressure is somewhat liable. Hold Lopressor this morning. Follow.  #9 COPD exacerbation Patient on home O2 as needed. Patient complaining of tightness yesterday morning. No wheezing noted on examination. Decrease Solu-Medrol to 60 mg IV every day. Discontinued dulera. Continue Pulmicort, Claritin, Flonase, Mucinex. Continue scheduled nebulizers. Continue empiric IV antibiotics of Zosyn. Pulmonary following. Follow.  #10 chronic atrial fibrillation CHA2DS2Vasc score 6 Patient currently rate controlled on metoprolol. Patient needs to be on anticoagulation however not  on any anticoagulation. Patient noted to have a prior history of significant GI bleed per family while on anticoagulation and deemed not a long-term anticoagulation candidate.   #11 acute on chronic vs chronic diastolic heart failure/pulmonary edema Patient noted to have elevated BNP on admission however patient also with chronic kidney disease stage II. Patient also noted to be hypoxic and concerns for pleural effusion on chest x-ray. Repeat chest x-ray. 2-D echo with a normal EF however grade 2 diastolic dysfunction noted with severe pulmonary hypertension, moderately to severely reduced right ventricular function, dilated right atrium, severely dilated right ventricular cavity size. Due to liable blood pressure diuretics have been on hold. Strict I's and O's. Daily weights. Will place patient on Lasix 20 mg IV every 12 hours. Strict I's and O's. Daily weights.  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 Follow.  #13 incidental aortic ascending aneurysm Noted on CT. Outpatient follow-up.  #14 stable spiculated masses right middle lobe and right upper lobe Per CT scan chest without contrast 07/13/2017 which stated that given the long-term stability, benign processes likely favored. Patient will need ongoing outpatient follow-up with pulmonary.  #15 UTI Urine cultures pending. Continue IV Zosyn.    DVT prophylaxis: Heparin Code Status: Full Family Communication: Updated patient. No family at bedside. Disposition Plan: Remain in step down unit, while hypoxic and on high flow nasal cannula.   Consultants:   Pulmonary: Dr. Luan Pulling 07/16/2017  Procedures:   Chest x-ray 07/13/2017  Plain films of the right tib-fib 07/13/2017  CT abdomen and pelvis 07/13/2017  CT chest without  contrast 07/13/2017  2-D echo 07/14/2017--- EF 65-70%, dynamic obstruction at rest and mid cavity, grade 1 diastolic dysfunction, severely dilated right ventricular cavity size, systolic function  moderately to severely reduced, right atrium severely dilated, pulmonary artery systolic pressure severely increased PA peak pressure of 81 mmHg.  VQ scan 07/15/2017  Lower Extremity Dopplers 07/15/2017  Antimicrobials:   IV Zosyn 07/13/2017  IV vancomycin 07/13/2017>>>>> 07/16/2017   Subjective: Patient sitting up in chair. Patient states some improvement with chest tightness. Patient complaining of inability to get cough up.  Patient complaining of some shortness of breath with minimal exertion. Patient sitting in chair. Patient on high flow nasal cannula 3 L with O2 sats in the mid to high 90s.  Objective: Vitals:   07/16/17 0400 07/16/17 0500 07/16/17 0817 07/16/17 0823  BP:      Pulse:      Resp:      Temp: 98.1 F (36.7 C)     TempSrc: Oral     SpO2:   96% 96%  Weight:  97.4 kg (214 lb 11.7 oz)    Height:        Intake/Output Summary (Last 24 hours) at 07/16/17 0932 Last data filed at 07/16/17 0600  Gross per 24 hour  Intake              640 ml  Output              900 ml  Net             -260 ml   Filed Weights   07/14/17 0000 07/15/17 0500 07/16/17 0500  Weight: 89.8 kg (197 lb 15.6 oz) 89.9 kg (198 lb 3.1 oz) 97.4 kg (214 lb 11.7 oz)    Examination:  General exam: Sitting up in bed. Respiratory system: Coarse/Ronchorus BS. Bibasilar crackles. Poor to fair air movement. No wheezing. Minimal use of accessory muscles of respiration Cardiovascular system: S1 & S2 heard, RRR. No JVD, murmurs, rubs, gallops or clicks.1+ right lower extremity edema. Trace left lower extremity edema. Gastrointestinal system: Abdomen is nondistended, soft and nontender. No organomegaly or masses felt. Normal bowel sounds heard. Central nervous system: Alert and oriented. No focal neurological deficits. Extremities: Symmetric 5 x 5 power. Skin: Right lower extremity with some erythema, 1+ edema, small ulceration noted.  Psychiatry: Judgement and insight appear normal. Mood & affect  appropriate.     Data Reviewed: I have personally reviewed following labs and imaging studies  CBC:  Recent Labs Lab 07/13/17 1901 07/14/17 0443 07/15/17 0446 07/16/17 0343  WBC 21.2* 18.5* 14.2* 9.4  NEUTROABS 18.8* 17.2* 12.2*  --   HGB 15.4 14.4 13.0 13.4  HCT 49.5 45.5 40.1 42.1  MCV 95.2 92.7 91.6 92.1  PLT 159 161 154 536   Basic Metabolic Panel:  Recent Labs Lab 07/13/17 1901 07/14/17 0443 07/15/17 0446 07/16/17 0343  NA 137 138 135 135  K 4.8 4.6 4.0 4.3  CL 103 103 101 105  CO2 16* 24 24 23   GLUCOSE 130* 160* 199* 298*  BUN 33* 39* 44* 40*  CREATININE 1.90* 1.85* 1.72* 1.48*  CALCIUM 9.7 9.6 8.8* 8.9  MG  --   --   --  1.8   GFR: Estimated Creatinine Clearance: 52.2 mL/min (A) (by C-G formula based on SCr of 1.48 mg/dL (H)). Liver Function Tests:  Recent Labs Lab 07/13/17 1901 07/14/17 0443  AST 46* 37  ALT 15* 12*  ALKPHOS 76 71  BILITOT 2.1* 1.9*  PROT  6.8 6.0*  ALBUMIN 3.6 3.3*   No results for input(s): LIPASE, AMYLASE in the last 168 hours. No results for input(s): AMMONIA in the last 168 hours. Coagulation Profile: No results for input(s): INR, PROTIME in the last 168 hours. Cardiac Enzymes: No results for input(s): CKTOTAL, CKMB, CKMBINDEX, TROPONINI in the last 168 hours. BNP (last 3 results) No results for input(s): PROBNP in the last 8760 hours. HbA1C:  Recent Labs  07/14/17 1509  HGBA1C 7.9*   CBG:  Recent Labs Lab 07/15/17 0707 07/15/17 1135 07/15/17 1641 07/15/17 2137 07/16/17 0753  GLUCAP 152* 167* 269* 240* 215*   Lipid Profile:  Recent Labs  07/16/17 0343  CHOL 103  HDL 42  LDLCALC 50  TRIG 54  CHOLHDL 2.5   Thyroid Function Tests: No results for input(s): TSH, T4TOTAL, FREET4, T3FREE, THYROIDAB in the last 72 hours. Anemia Panel: No results for input(s): VITAMINB12, FOLATE, FERRITIN, TIBC, IRON, RETICCTPCT in the last 72 hours. Sepsis Labs:  Recent Labs Lab 07/14/17 1509 07/14/17 2052  07/15/17 0446 07/16/17 0343  LATICACIDVEN 3.3* 2.6* 2.7* 1.5    Recent Results (from the past 240 hour(s))  Blood Culture (routine x 2)     Status: None (Preliminary result)   Collection Time: 07/13/17  9:00 PM  Result Value Ref Range Status   Specimen Description BLOOD RIGHT FOREARM  Final   Special Requests   Final    Blood Culture results may not be optimal due to an inadequate volume of blood received in culture bottles   Culture NO GROWTH 3 DAYS  Final   Report Status PENDING  Incomplete  Blood Culture (routine x 2)     Status: None (Preliminary result)   Collection Time: 07/13/17  9:00 PM  Result Value Ref Range Status   Specimen Description BLOOD LEFT FOREARM  Final   Special Requests   Final    Blood Culture results may not be optimal due to an inadequate volume of blood received in culture bottles   Culture NO GROWTH 3 DAYS  Final   Report Status PENDING  Incomplete  MRSA PCR Screening     Status: None   Collection Time: 07/14/17 12:46 AM  Result Value Ref Range Status   MRSA by PCR NEGATIVE NEGATIVE Final    Comment:        The GeneXpert MRSA Assay (FDA approved for NASAL specimens only), is one component of a comprehensive MRSA colonization surveillance program. It is not intended to diagnose MRSA infection nor to guide or monitor treatment for MRSA infections.          Radiology Studies: Nm Pulmonary Perf And Vent  Result Date: 07/15/2017 CLINICAL DATA:  Concern for pulmonary embolism. Short breath for 1 week. Renal insufficiency. EXAM: NUCLEAR MEDICINE VENTILATION - PERFUSION LUNG SCAN TECHNIQUE: Ventilation images were obtained in multiple projections using inhaled aerosol Tc-21mDTPA. Perfusion images were obtained in multiple projections after intravenous injection of Tc-961mAA. RADIOPHARMACEUTICALS:  Thirty mCi Technetium-9968mPA aerosol inhalation and 4 mCi Technetium-75m77m IV COMPARISON:  None. FINDINGS: Ventilation: Multiple peripheral ventilation  defects. Decreased ventilation in the RIGHT lower lobe. Heterogeneous ventilation MRI. Perfusion: There several perfusion defects which are match by ventilation defect. For example centrally in the RIGHT lower lobe away from the border which is matched by the ventilation defect. There is poor ventilation to the RIGHT lower lobe as well as poor perfusion which likely relates to effusion on comparison radiograph. There is no wedge-shaped peripheral perfusion defect which  are unmatched to suggest acute pulmonary embolism IMPRESSION: 1. No wedge-shaped peripheral perfusion defect which are unmatched to suggest acute pulmonary embolism. 2. Multiple perfusion defects are matched by ventilation defects most consistent with COPD. 3. Decreased profusion and ventilation to the RIGHT lung base correlates with pleural effusion. Electronically Signed   By: Suzy Bouchard M.D.   On: 07/15/2017 13:31   US Venous Img Lower Bilateral  Result Date: 07/15/2017 CLINICAL DATA:  Lower extremity swelling. Hypoxia. Previous history of DVT and pulmonary embolism. EXAM: BILATERAL LOWER EXTREMITY VENOUS DOPPLER ULTRASOUND TECHNIQUE: Gray-scale sonography with graded compression, as well as color Doppler and duplex ultrasound were performed to evaluate the lower extremity deep venous systems from the level of the common femoral vein and including the common femoral, femoral, profunda femoral, popliteal and calf veins including the posterior tibial, peroneal and gastrocnemius veins when visible. The superficial great saphenous vein was also interrogated. Spectral Doppler was utilized to evaluate flow at rest and with distal augmentation maneuvers in the common femoral, femoral and popliteal veins. COMPARISON:  None. FINDINGS: RIGHT LOWER EXTREMITY Common Femoral Vein: No evidence of thrombus. Normal compressibility, respiratory phasicity and response to augmentation. Saphenofemoral Junction: No evidence of thrombus. Normal compressibility  and flow on color Doppler imaging. Profunda Femoral Vein: No evidence of thrombus. Normal compressibility and flow on color Doppler imaging. Femoral Vein: No evidence of thrombus. Normal compressibility, respiratory phasicity and response to augmentation. Popliteal Vein: No evidence of thrombus. Normal compressibility, respiratory phasicity and response to augmentation. Calf Veins: No evidence of thrombus. Normal compressibility and flow on color Doppler imaging. Superficial Great Saphenous Vein: No evidence of thrombus. Normal compressibility and flow on color Doppler imaging. Venous Reflux:  None. Other Findings:  None. LEFT LOWER EXTREMITY Common Femoral Vein: No evidence of thrombus. Normal compressibility, respiratory phasicity and response to augmentation. Saphenofemoral Junction: No evidence of thrombus. Normal compressibility and flow on color Doppler imaging. Profunda Femoral Vein: No evidence of thrombus. Normal compressibility and flow on color Doppler imaging. Femoral Vein: No evidence of thrombus. Normal compressibility, respiratory phasicity and response to augmentation. Popliteal Vein: No evidence of thrombus. Normal compressibility, respiratory phasicity and response to augmentation. Calf Veins: No evidence of thrombus. Normal compressibility and flow on color Doppler imaging. Superficial Great Saphenous Vein: No evidence of thrombus. Normal compressibility and flow on color Doppler imaging. Venous Reflux:  None. Other Findings:  None. IMPRESSION: No evidence of DVT within either lower extremity. Electronically Signed   By: Earle Gell M.D.   On: 07/15/2017 12:36   Dg Chest Port 1 View  Result Date: 07/15/2017 CLINICAL DATA:  Hypoxia EXAM: PORTABLE CHEST 1 VIEW COMPARISON:  CT from 2 days ago FINDINGS: Chronic cardiopericardial enlargement. Chronic widening of upper mediastinal contours attributed to ectatic vessels based on recent CT. Small pleural effusions. Hyperinflation and emphysematous  changes. No Kerley lines or air bronchogram. Chronic enlargement of pulmonary arteries. IMPRESSION: 1. Small pleural effusions that have mildly increased from 2 days ago. 2. Emphysema. 3. Chronic cardiomegaly. Large pulmonary arteries suggesting hypertension. Electronically Signed   By: Monte Fantasia M.D.   On: 07/15/2017 10:50        Scheduled Meds: . aspirin EC  81 mg Oral Daily  . budesonide (PULMICORT) nebulizer solution  0.5 mg Nebulization BID  . fluticasone  2 spray Each Nare Daily  . furosemide  20 mg Intravenous Q12H  . guaiFENesin  1,200 mg Oral BID  . heparin subcutaneous  5,000 Units Subcutaneous Q8H  . insulin aspart  0-15 Units Subcutaneous TID WC  . [START ON 07/17/2017] insulin glargine  10 Units Subcutaneous BH-q7a  . insulin glargine  5 Units Subcutaneous Once  . ipratropium-albuterol  3 mL Nebulization Q6H  . loratadine  10 mg Oral Daily  . methylPREDNISolone (SOLU-MEDROL) injection  60 mg Intravenous Daily  . metoprolol tartrate  12.5 mg Oral BID  . pantoprazole  40 mg Oral Daily  . pravastatin  40 mg Oral q1800  . sildenafil  20 mg Oral TID   Continuous Infusions: . piperacillin-tazobactam (ZOSYN)  IV 3.375 g (07/16/17 0543)  . sodium chloride    . vancomycin Stopped (07/15/17 2320)  . vancomycin Stopped (07/13/17 2139)     LOS: 3 days    Time spent: 43 mins    Ayman Brull, MD [facility] Pager [phone]  If 7PM-7AM, please contact night-coverage [email or web address] Password [email or web address] 07/16/2017, 9:32 AM

## 2017-07-16 NOTE — Progress Notes (Signed)
PT Cancellation Note  Patient Details Name: Austin [name] MRN: [id number] DOB: 25-Feb-1941   Cancelled Treatment:    Reason Eval/Treat Not Completed: Medical issues which prohibited therapy.  Pt was not able to be seen over variable low O2 sats, and will try again tomorrow.   Ivar DrapeRuth E Josslynn Mentzer 07/16/2017, 5:45 PM   5:45 PM, 07/16/17 Samul Dadauth Kennedie Pardoe, PT, MS Physical Therapist - [place] [phone](ASCOM)  [phone] (Office)

## 2017-07-16 NOTE — Consult Note (Signed)
Consult requested by: [facility], Dr. Janee Morn Consult requested for:  HPI: This is a 76 year old who was admitted to the hospital on August 3 with increasing shortness of breath. He has a significant history of asthma/COPD chronic atrial fib what is described as diastolic heart failure hypertension hyperlipidemia diabetes and previous history of DVT and pulmonary embolus. He has not been on anticoagulation because of GI bleeding. He's been coughing some. He's not coughing much up. He uses oxygen as needed at home. He was found to be almost unresponsive when EMS was called and they started him on BiPAP and brought him to the hospital. He's been given fluid resuscitation but now may be somewhat volume overloaded. Echocardiogram shows significant pulmonary hypertension and right ventricular failure. There was concern about pulmonary embolus particularly considering his history so he had ventilation/perfusion lung scan and that was negative. He also had ultrasound of the legs which was negative. He has required high flow oxygen. He denies chest pain. He has had some swelling of his legs. He's not had hemoptysis. He has coughed up a little bit of sputum. No definite fever or chills. No nausea or vomiting.  Past Medical History:  Diagnosis Date  . Asthma   . Atrial fibrillation (HCC)   . CHF (congestive heart failure) (HCC)   . COPD (chronic obstructive pulmonary disease) (HCC)   . Diabetes mellitus without complication (HCC)   . DVT (deep venous thrombosis) (HCC)   . Gastric ulcer   . GERD (gastroesophageal reflux disease)   . Hypercholesteremia   . Hypertension   . PE (pulmonary embolism)      Family History  Problem Relation Age of Onset  . CAD Brother   . Congestive Heart Failure Brother      Social History   Social History  . Marital status: Married    Spouse name: N/A  . Number of children: N/A  . Years of education: N/A   Social History Main Topics  . Smoking status:  Current Every Day Smoker    Packs/day: 0.50    Years: 45.00    Types: Cigarettes  . Smokeless tobacco: Never Used  . Alcohol use No  . Drug use: No  . Sexual activity: Not Asked   Other Topics Concern  . None   Social History Narrative  . None     ROS: Except as mentioned 10 point review of systems is negative    Objective: Vital signs in last 24 hours: Temp:  [97.3 F (36.3 C)-98.1 F (36.7 C)] 98.1 F (36.7 C) (08/06 0400) Pulse Rate:  [69-88] 86 (08/05 2156) Resp:  [19-30] 20 (08/05 2002) BP: (108-131)/(60-111) 124/60 (08/05 2156) SpO2:  [79 %-97 %] 96 % (08/06 0823) Weight:  [97.4 kg (214 lb 11.7 oz)] 97.4 kg (214 lb 11.7 oz) (08/06 0500) Weight change: 7.5 kg (16 lb 8.6 oz)    Intake/Output from previous day: 08/05 0701 - 08/06 0700 In: 1120 [P.O.:970; IV Piggyback:150] Out: 900 [Urine:900]  PHYSICAL EXAM Constitutional: He is awake and alert and looks weak. Eyes: Pupils react ears nose mouth and throat: Mucous membranes are dry. Cardiovascular: His heart is irregular. He has a systolic heart murmur. Respiratory: He has bilateral rhonchi and prolonged expiratory phase. Gastrointestinal: His abdomen is soft without masses. Musculoskeletal: He has generalized weakness which is symmetrical. Neurological: No focal findings. Psychiatric: Normal mood and affect  Lab Results: Basic Metabolic Panel:  Recent Labs  96/04/54 0446 07/16/17 0343  NA 135 135  K 4.0 4.3  CL 101 105  CO2 24 23  GLUCOSE 199* 298*  BUN 44* 40*  CREATININE 1.72* 1.48*  CALCIUM 8.8* 8.9  MG  --  1.8   Liver Function Tests:  Recent Labs  07/13/17 1901 07/14/17 0443  AST 46* 37  ALT 15* 12*  ALKPHOS 76 71  BILITOT 2.1* 1.9*  PROT 6.8 6.0*  ALBUMIN 3.6 3.3*   No results for input(s): LIPASE, AMYLASE in the last 72 hours. No results for input(s): AMMONIA in the last 72 hours. CBC:  Recent Labs  07/14/17 0443 07/15/17 0446 07/16/17 0343  WBC 18.5* 14.2* 9.4  NEUTROABS  17.2* 12.2*  --   HGB 14.4 13.0 13.4  HCT 45.5 40.1 42.1  MCV 92.7 91.6 92.1  PLT 161 154 150   Cardiac Enzymes: No results for input(s): CKTOTAL, CKMB, CKMBINDEX, TROPONINI in the last 72 hours. BNP: No results for input(s): PROBNP in the last 72 hours. D-Dimer: No results for input(s): DDIMER in the last 72 hours. CBG:  Recent Labs  07/14/17 2121 07/15/17 0707 07/15/17 1135 07/15/17 1641 07/15/17 2137 07/16/17 0753  GLUCAP 240* 152* 167* 269* 240* 215*   Hemoglobin A1C:  Recent Labs  07/14/17 1509  HGBA1C 7.9*   Fasting Lipid Panel:  Recent Labs  07/16/17 0343  CHOL 103  HDL 42  LDLCALC 50  TRIG 54  CHOLHDL 2.5   Thyroid Function Tests: No results for input(s): TSH, T4TOTAL, FREET4, T3FREE, THYROIDAB in the last 72 hours. Anemia Panel: No results for input(s): VITAMINB12, FOLATE, FERRITIN, TIBC, IRON, RETICCTPCT in the last 72 hours. Coagulation: No results for input(s): LABPROT, INR in the last 72 hours. Urine Drug Screen: Drugs of Abuse  No results found for: LABOPIA, COCAINSCRNUR, LABBENZ, AMPHETMU, THCU, LABBARB  Alcohol Level: No results for input(s): ETH in the last 72 hours. Urinalysis:  Recent Labs  07/15/17 1415  COLORURINE YELLOW  THIS TEST WAS ORDERED IN ERROR AND HAS BEEN CREDITED.*  LABSPEC 1.021  THIS TEST WAS ORDERED IN ERROR AND HAS BEEN CREDITED.  PHURINE 5.0  THIS TEST WAS ORDERED IN ERROR AND HAS BEEN CREDITED.  GLUCOSEU NEGATIVE  THIS TEST WAS ORDERED IN ERROR AND HAS BEEN CREDITED.  HGBUR MODERATE*  THIS TEST WAS ORDERED IN ERROR AND HAS BEEN CREDITED.  BILIRUBINUR NEGATIVE  THIS TEST WAS ORDERED IN ERROR AND HAS BEEN CREDITED.  KETONESUR NEGATIVE  THIS TEST WAS ORDERED IN ERROR AND HAS BEEN CREDITED.  PROTEINUR 30*  THIS TEST WAS ORDERED IN ERROR AND HAS BEEN CREDITED.  NITRITE NEGATIVE  THIS TEST WAS ORDERED IN ERROR AND HAS BEEN CREDITED.  LEUKOCYTESUR SMALL*  THIS TEST WAS ORDERED IN ERROR AND HAS BEEN CREDITED.    Misc. Labs:   ABGS:  Recent Labs  07/15/17 1000  PHART 7.388  PO2ART 82.3*  HCO3 22.1     MICROBIOLOGY: Recent Results (from the past 240 hour(s))  Blood Culture (routine x 2)     Status: None (Preliminary result)   Collection Time: 07/13/17  9:00 PM  Result Value Ref Range Status   Specimen Description BLOOD RIGHT FOREARM  Final   Special Requests   Final    Blood Culture results may not be optimal due to an inadequate volume of blood received in culture bottles   Culture NO GROWTH 3 DAYS  Final   Report Status PENDING  Incomplete  Blood Culture (routine x 2)     Status: None (Preliminary result)   Collection Time: 07/13/17  9:00 PM  Result Value Ref Range Status   Specimen Description BLOOD LEFT FOREARM  Final   Special Requests   Final    Blood Culture results may not be optimal due to an inadequate volume of blood received in culture bottles   Culture NO GROWTH 3 DAYS  Final   Report Status PENDING  Incomplete  MRSA PCR Screening     Status: None   Collection Time: 07/14/17 12:46 AM  Result Value Ref Range Status   MRSA by PCR NEGATIVE NEGATIVE Final    Comment:        The GeneXpert MRSA Assay (FDA approved for NASAL specimens only), is one component of a comprehensive MRSA colonization surveillance program. It is not intended to diagnose MRSA infection nor to guide or monitor treatment for MRSA infections.     Studies/Results: Nm Pulmonary Perf And Vent  Result Date: 07/15/2017 CLINICAL DATA:  Concern for pulmonary embolism. Short breath for 1 week. Renal insufficiency. EXAM: NUCLEAR MEDICINE VENTILATION - PERFUSION LUNG SCAN TECHNIQUE: Ventilation images were obtained in multiple projections using inhaled aerosol Tc-5918m DTPA. Perfusion images were obtained in multiple projections after intravenous injection of Tc-6218m MAA. RADIOPHARMACEUTICALS:  Thirty mCi Technetium-2018m DTPA aerosol inhalation and 4 mCi Technetium-6318m MAA IV COMPARISON:  None. FINDINGS:  Ventilation: Multiple peripheral ventilation defects. Decreased ventilation in the RIGHT lower lobe. Heterogeneous ventilation MRI. Perfusion: There several perfusion defects which are match by ventilation defect. For example centrally in the RIGHT lower lobe away from the border which is matched by the ventilation defect. There is poor ventilation to the RIGHT lower lobe as well as poor perfusion which likely relates to effusion on comparison radiograph. There is no wedge-shaped peripheral perfusion defect which are unmatched to suggest acute pulmonary embolism IMPRESSION: 1. No wedge-shaped peripheral perfusion defect which are unmatched to suggest acute pulmonary embolism. 2. Multiple perfusion defects are matched by ventilation defects most consistent with COPD. 3. Decreased profusion and ventilation to the RIGHT lung base correlates with pleural effusion. Electronically Signed   By: Genevive BiStewart  Edmunds M.D.   On: 07/15/2017 13:31   [place] Venous Img Lower Bilateral  Result Date: 07/15/2017 CLINICAL DATA:  Lower extremity swelling. Hypoxia. Previous history of DVT and pulmonary embolism. EXAM: BILATERAL LOWER EXTREMITY VENOUS DOPPLER ULTRASOUND TECHNIQUE: Gray-scale sonography with graded compression, as well as color Doppler and duplex ultrasound were performed to evaluate the lower extremity deep venous systems from the level of the common femoral vein and including the common femoral, femoral, profunda femoral, popliteal and calf veins including the posterior tibial, peroneal and gastrocnemius veins when visible. The superficial great saphenous vein was also interrogated. Spectral Doppler was utilized to evaluate flow at rest and with distal augmentation maneuvers in the common femoral, femoral and popliteal veins. COMPARISON:  None. FINDINGS: RIGHT LOWER EXTREMITY Common Femoral Vein: No evidence of thrombus. Normal compressibility, respiratory phasicity and response to augmentation. Saphenofemoral Junction: No  evidence of thrombus. Normal compressibility and flow on color Doppler imaging. Profunda Femoral Vein: No evidence of thrombus. Normal compressibility and flow on color Doppler imaging. Femoral Vein: No evidence of thrombus. Normal compressibility, respiratory phasicity and response to augmentation. Popliteal Vein: No evidence of thrombus. Normal compressibility, respiratory phasicity and response to augmentation. Calf Veins: No evidence of thrombus. Normal compressibility and flow on color Doppler imaging. Superficial Great Saphenous Vein: No evidence of thrombus. Normal compressibility and flow on color Doppler imaging. Venous Reflux:  None. Other Findings:  None. LEFT LOWER EXTREMITY Common Femoral Vein: No evidence of thrombus.  Normal compressibility, respiratory phasicity and response to augmentation. Saphenofemoral Junction: No evidence of thrombus. Normal compressibility and flow on color Doppler imaging. Profunda Femoral Vein: No evidence of thrombus. Normal compressibility and flow on color Doppler imaging. Femoral Vein: No evidence of thrombus. Normal compressibility, respiratory phasicity and response to augmentation. Popliteal Vein: No evidence of thrombus. Normal compressibility, respiratory phasicity and response to augmentation. Calf Veins: No evidence of thrombus. Normal compressibility and flow on color Doppler imaging. Superficial Great Saphenous Vein: No evidence of thrombus. Normal compressibility and flow on color Doppler imaging. Venous Reflux:  None. Other Findings:  None. IMPRESSION: No evidence of DVT within either lower extremity. Electronically Signed   By: Myles Rosenthal M.D.   On: 07/15/2017 12:36   Dg Chest Port 1 View  Result Date: 07/15/2017 CLINICAL DATA:  Hypoxia EXAM: PORTABLE CHEST 1 VIEW COMPARISON:  CT from 2 days ago FINDINGS: Chronic cardiopericardial enlargement. Chronic widening of upper mediastinal contours attributed to ectatic vessels based on recent CT. Small pleural  effusions. Hyperinflation and emphysematous changes. No Kerley lines or air bronchogram. Chronic enlargement of pulmonary arteries. IMPRESSION: 1. Small pleural effusions that have mildly increased from 2 days ago. 2. Emphysema. 3. Chronic cardiomegaly. Large pulmonary arteries suggesting hypertension. Electronically Signed   By: Marnee Spring M.D.   On: 07/15/2017 10:50    Medications:  Prior to Admission:  Prescriptions Prior to Admission  Medication Sig Dispense Refill Last Dose  . albuterol (PROAIR HFA) 108 (90 Base) MCG/ACT inhaler Inhale 1-2 puffs into the lungs every 6 (six) hours as needed for wheezing or shortness of breath.   Past Week at Unknown time  . albuterol (PROVENTIL) (2.5 MG/3ML) 0.083% nebulizer solution Take 3 mLs (2.5 mg total) by nebulization every 6 (six) hours as needed for wheezing or shortness of breath. 75 mL 12 Past Week at Unknown time  . aspirin EC 81 MG tablet Take 81 mg by mouth daily.   07/13/2017 at Unknown time  . budesonide-formoterol (SYMBICORT) 160-4.5 MCG/ACT inhaler Inhale 2 puffs into the lungs 2 (two) times daily as needed (for shortness of breath).    Past Week at Unknown time  . furosemide (LASIX) 40 MG tablet Take 1 tablet (40 mg total) by mouth 2 (two) times daily. 60 tablet 5 07/13/2017 at Unknown time  . glipiZIDE (GLUCOTROL) 5 MG tablet Take 5 mg by mouth 2 (two) times daily.   07/13/2017 at Unknown time  . lisinopril (PRINIVIL,ZESTRIL) 40 MG tablet Take 40 mg by mouth daily.   07/13/2017 at Unknown time  . lovastatin (MEVACOR) 40 MG tablet Take 40 mg by mouth at bedtime. Pt should take with food.    07/12/2017 at Unknown time  . metFORMIN (GLUCOPHAGE) 500 MG tablet Take 500 mg by mouth 2 (two) times daily with a meal.   07/13/2017 at Unknown time  . metoprolol tartrate (LOPRESSOR) 25 MG tablet Take 12.5 mg by mouth 2 (two) times daily.    07/13/2017 at 730a  . omeprazole (PRILOSEC) 40 MG capsule Take 40 mg by mouth daily.   07/13/2017 at Unknown time  .  polyethylene glycol (MIRALAX) packet Take 17 g by mouth daily as needed for moderate constipation. 30 each 0 unknown  . nitroGLYCERIN (NITROSTAT) 0.4 MG SL tablet Place 0.4 mg under the tongue every 5 (five) minutes x 3 doses as needed for chest pain. If no relief call 911 or go to emergency room.   unknown   Scheduled: . aspirin EC  81 mg Oral  Daily  . budesonide (PULMICORT) nebulizer solution  0.5 mg Nebulization BID  . fluticasone  2 spray Each Nare Daily  . furosemide  20 mg Intravenous Q12H  . guaiFENesin  1,200 mg Oral BID  . heparin subcutaneous  5,000 Units Subcutaneous Q8H  . insulin aspart  0-15 Units Subcutaneous TID WC  . [START ON 07/17/2017] insulin glargine  10 Units Subcutaneous [id number]  . insulin glargine  5 Units Subcutaneous Once  . ipratropium-albuterol  3 mL Nebulization Q6H  . loratadine  10 mg Oral Daily  . methylPREDNISolone (SOLU-MEDROL) injection  60 mg Intravenous Daily  . metoprolol tartrate  12.5 mg Oral BID  . pantoprazole  40 mg Oral Daily  . pravastatin  40 mg Oral q1800  . sildenafil  20 mg Oral TID   Continuous: . piperacillin-tazobactam (ZOSYN)  IV 3.375 g (07/16/17 0543)  . sodium chloride    . vancomycin Stopped (07/15/17 2320)  . vancomycin Stopped (07/13/17 2139)   ZOX:[id number], LORazepam, nitroGLYCERIN, polyethylene glycol  Assesment:He has acute hypoxic respiratory failure. He required BiPAP but now is on high flow nasal cannula but with lower flow rate now. He was felt to be septic on admission and had lactic acidemia. Echocardiogram shows pulmonary hypertension and right heart failure. This may be related to hypoxia. Principal Problem:   Acute respiratory failure with hypoxia (HCC) Active Problems:   Type 2 diabetes mellitus (HCC)   HLD (hyperlipidemia)   GERD (gastroesophageal reflux disease)   Essential hypertension   Chronic pulmonary embolism (HCC)   COPD exacerbation (HCC)   Sepsis due to undetermined organism with acute  respiratory failure (HCC)   Ascending aortic aneurysm (HCC)   Elevated bilirubin   Left nephrolithiasis   Lactic acidosis   Moderate to severe pulmonary hypertension (HCC)--per 2-D echo 07/14/2017   Mass of lung    Plan: Continue current treatments. Add revatio. Agree with tapering steroids and oxygen. He will need sleep study as an outpatient pulmonary function testing as an outpatient and I think he's probably going to require oxygen full-time    LOS: 3 days   Jossue Rubenstein L 07/16/2017, 8:58 AM

## 2017-07-16 NOTE — Progress Notes (Signed)
Weaning oxygen as instructed by Dr. Janee Mornhompson.  Tolerated weaning down to 2L/min unless having activity.  Oxygen levels drop into low-mid 80s temporarily with activity.

## 2017-07-17 ENCOUNTER — Inpatient Hospital Stay (HOSPITAL_COMMUNITY): Payer: Medicare Other

## 2017-07-17 DIAGNOSIS — I482 Chronic atrial fibrillation, unspecified: Secondary | ICD-10-CM | POA: Diagnosis present

## 2017-07-17 LAB — CBC WITH DIFFERENTIAL/PLATELET
BASOS ABS: 0 10*3/uL (ref 0.0–0.1)
Basophils Relative: 0 %
EOS PCT: 0 %
Eosinophils Absolute: 0 10*3/uL (ref 0.0–0.7)
HCT: 43 % (ref 39.0–52.0)
Hemoglobin: 13.5 g/dL (ref 13.0–17.0)
LYMPHS PCT: 6 %
Lymphs Abs: 0.6 10*3/uL — ABNORMAL LOW (ref 0.7–4.0)
MCH: 29 pg (ref 26.0–34.0)
MCHC: 31.4 g/dL (ref 30.0–36.0)
MCV: 92.5 fL (ref 78.0–100.0)
MONO ABS: 0.7 10*3/uL (ref 0.1–1.0)
Monocytes Relative: 6 %
Neutro Abs: 9.6 10*3/uL — ABNORMAL HIGH (ref 1.7–7.7)
Neutrophils Relative %: 88 %
PLATELETS: 146 10*3/uL — AB (ref 150–400)
RBC: 4.65 MIL/uL (ref 4.22–5.81)
RDW: 17.5 % — AB (ref 11.5–15.5)
WBC: 10.9 10*3/uL — ABNORMAL HIGH (ref 4.0–10.5)

## 2017-07-17 LAB — BASIC METABOLIC PANEL
ANION GAP: 7 (ref 5–15)
BUN: 37 mg/dL — ABNORMAL HIGH (ref 6–20)
CALCIUM: 9.4 mg/dL (ref 8.9–10.3)
CO2: 26 mmol/L (ref 22–32)
CREATININE: 1.55 mg/dL — AB (ref 0.61–1.24)
Chloride: 103 mmol/L (ref 101–111)
GFR calc Af Amer: 49 mL/min — ABNORMAL LOW (ref >60)
GFR, EST NON AFRICAN AMERICAN: 42 mL/min — AB (ref >60)
GLUCOSE: 228 mg/dL — AB (ref 65–99)
Potassium: 4.4 mmol/L (ref 3.5–5.1)
Sodium: 136 mmol/L (ref 135–145)

## 2017-07-17 LAB — GLUCOSE, CAPILLARY
GLUCOSE-CAPILLARY: 274 mg/dL — AB (ref 65–99)
Glucose-Capillary: 187 mg/dL — ABNORMAL HIGH (ref 65–99)
Glucose-Capillary: 190 mg/dL — ABNORMAL HIGH (ref 65–99)
Glucose-Capillary: 227 mg/dL — ABNORMAL HIGH (ref 65–99)

## 2017-07-17 LAB — URINE CULTURE: CULTURE: NO GROWTH

## 2017-07-17 LAB — HEMOGLOBIN A1C
Hgb A1c MFr Bld: 7.9 % — ABNORMAL HIGH (ref 4.8–5.6)
Mean Plasma Glucose: 180 mg/dL

## 2017-07-17 MED ORDER — DILTIAZEM HCL 100 MG IV SOLR
5.0000 mg/h | INTRAVENOUS | Status: DC
  Administered 2017-07-17 (×2): 5 mg/h via INTRAVENOUS
  Administered 2017-07-17 – 2017-07-18 (×2): 12.5 mg/h via INTRAVENOUS
  Filled 2017-07-17 (×2): qty 100

## 2017-07-17 MED ORDER — IPRATROPIUM BROMIDE 0.02 % IN SOLN: 0.5000 mg | RESPIRATORY_TRACT | Status: DC | PRN

## 2017-07-17 MED ORDER — FUROSEMIDE 10 MG/ML IJ SOLN
40.0000 mg | Freq: Two times a day (BID) | INTRAMUSCULAR | Status: DC
  Administered 2017-07-17 – 2017-07-19 (×5): 40 mg via INTRAVENOUS
  Filled 2017-07-17 (×4): qty 4

## 2017-07-17 MED ORDER — IPRATROPIUM BROMIDE 0.02 % IN SOLN
0.5000 mg | Freq: Four times a day (QID) | RESPIRATORY_TRACT | Status: DC
  Administered 2017-07-17 (×2): 0.5 mg via RESPIRATORY_TRACT
  Filled 2017-07-17 (×3): qty 2.5

## 2017-07-17 MED ORDER — DILTIAZEM LOAD VIA INFUSION
10.0000 mg | Freq: Once | INTRAVENOUS | Status: AC
  Administered 2017-07-17: 10 mg via INTRAVENOUS

## 2017-07-17 MED ORDER — INSULIN ASPART 100 UNIT/ML ~~LOC~~ SOLN
0.0000 [IU] | Freq: Three times a day (TID) | SUBCUTANEOUS | Status: DC
  Administered 2017-07-18: 8 [IU] via SUBCUTANEOUS
  Administered 2017-07-18 (×2): 3 [IU] via SUBCUTANEOUS
  Administered 2017-07-19 (×2): 5 [IU] via SUBCUTANEOUS
  Administered 2017-07-19: 15 [IU] via SUBCUTANEOUS
  Administered 2017-07-20: 5 [IU] via SUBCUTANEOUS
  Administered 2017-07-20 (×2): 11 [IU] via SUBCUTANEOUS
  Administered 2017-07-21: 5 [IU] via SUBCUTANEOUS

## 2017-07-17 MED ORDER — DILTIAZEM HCL 25 MG/5ML IV SOLN
INTRAVENOUS | Status: AC
  Filled 2017-07-17: qty 5

## 2017-07-17 MED ORDER — LEVALBUTEROL HCL 0.63 MG/3ML IN NEBU: 0.6300 mg | INHALATION_SOLUTION | RESPIRATORY_TRACT | Status: DC | PRN

## 2017-07-17 MED ORDER — INSULIN ASPART 100 UNIT/ML ~~LOC~~ SOLN
0.0000 [IU] | Freq: Every day | SUBCUTANEOUS | Status: DC
  Administered 2017-07-17: 2 [IU] via SUBCUTANEOUS
  Administered 2017-07-18: 4 [IU] via SUBCUTANEOUS
  Administered 2017-07-20: 3 [IU] via SUBCUTANEOUS

## 2017-07-17 MED ORDER — DILTIAZEM HCL 100 MG IV SOLR
INTRAVENOUS | Status: AC
  Administered 2017-07-17: 5 mg/h via INTRAVENOUS
  Filled 2017-07-17: qty 100

## 2017-07-17 MED ORDER — LEVALBUTEROL HCL 0.63 MG/3ML IN NEBU
0.6300 mg | INHALATION_SOLUTION | Freq: Four times a day (QID) | RESPIRATORY_TRACT | Status: DC
  Administered 2017-07-17 (×2): 0.63 mg via RESPIRATORY_TRACT
  Filled 2017-07-17 (×3): qty 3

## 2017-07-17 NOTE — Progress Notes (Signed)
Subjective: He says he feels a little bit better. His oxygen was able to be weaned but he is back up now on about 3-1/2 L. He says he is coughing productively and he is using a flutter valve to try to help. No other new complaints.  Objective: Vital signs in last 24 hours: Temp:  [97.4 F (36.3 C)-98.1 F (36.7 C)] 98.1 F (36.7 C) (08/07 0400) Pulse Rate:  [56-88] 57 (08/07 0600) Resp:  [16-26] 17 (08/07 0600) BP: (104-130)/(68-86) 120/86 (08/07 0600) SpO2:  [84 %-96 %] 96 % (08/07 0839) Weight:  [97.4 kg (214 lb 11.7 oz)] 97.4 kg (214 lb 11.7 oz) (08/07 0500) Weight change: 0 kg (0 lb) Last BM Date: 07/16/17  Intake/Output from previous day: 08/06 0701 - 08/07 0700 In: 770 [P.O.:720; IV Piggyback:50] Out: 1000 [Urine:1000]  PHYSICAL EXAM General appearance: alert, cooperative and no distress Resp: rhonchi bilaterally Cardio: irregularly irregular rhythm GI: soft, non-tender; bowel sounds normal; no masses,  no organomegaly Extremities: He has what seems to be some dependent edema of his legs and some chronic venous stasis changes Skin turgor normal  Lab Results:  Results for orders placed or performed during the hospital encounter of 07/13/17 (from the past 48 hour(s))  Blood gas, arterial     Status: Abnormal   Collection Time: 07/15/17 10:00 AM  Result Value Ref Range   O2 Content 4.0 L/min   Delivery systems HI FLOW NASAL CANNULA    pH, Arterial 7.388 7.350 - 7.450   pCO2 arterial 36.0 32.0 - 48.0 mmHg   pO2, Arterial 82.3 (L) 83.0 - 108.0 mmHg   Bicarbonate 22.1 20.0 - 28.0 mmol/L   Acid-base deficit 2.9 (H) 0.0 - 2.0 mmol/L   O2 Saturation 95.0 %   Patient temperature 37.0    Collection site RIGHT RADIAL    Drawn by [id number]    Sample type ARTERIAL    Allens test (pass/fail) PASS PASS  Glucose, capillary     Status: Abnormal   Collection Time: 07/15/17 11:35 AM  Result Value Ref Range   Glucose-Capillary 167 (H) 65 - 99 mg/dL  Urinalysis, Routine w reflex  microscopic     Status: Abnormal   Collection Time: 07/15/17  2:15 PM  Result Value Ref Range   Color, Urine (A) YELLOW    THIS TEST WAS ORDERED IN ERROR AND HAS BEEN CREDITED.    Comment: SPOKE WITH NURSE VIVIAN INSTRUMENT SENT OVER RESULTS WITHOUT MICRO NEW ORDER PLACED. CORRECTED ON 08/05 AT 1549: PREVIOUSLY REPORTED AS YELLOW    APPearance  CLEAR    THIS TEST WAS ORDERED IN ERROR AND HAS BEEN CREDITED.    Comment: CORRECTED ON 08/05 AT 1549: PREVIOUSLY REPORTED AS HAZY   Specific Gravity, Urine  1.005 - 1.030    THIS TEST WAS ORDERED IN ERROR AND HAS BEEN CREDITED.    Comment: CORRECTED ON 08/05 AT 1549: PREVIOUSLY REPORTED AS 1.023   pH  5.0 - 8.0    THIS TEST WAS ORDERED IN ERROR AND HAS BEEN CREDITED.    Comment: CORRECTED ON 08/05 AT 3664: PREVIOUSLY REPORTED AS 5.0   Glucose, UA  NEGATIVE mg/dL    THIS TEST WAS ORDERED IN ERROR AND HAS BEEN CREDITED.    Comment: CORRECTED ON 08/05 AT 4034: PREVIOUSLY REPORTED AS NEGATIVE   Hgb urine dipstick  NEGATIVE    THIS TEST WAS ORDERED IN ERROR AND HAS BEEN CREDITED.    Comment: CORRECTED ON 08/05 AT 7425: PREVIOUSLY REPORTED AS MODERATE  Bilirubin Urine  NEGATIVE    THIS TEST WAS ORDERED IN ERROR AND HAS BEEN CREDITED.    Comment: CORRECTED ON 08/05 AT 7619: PREVIOUSLY REPORTED AS NEGATIVE   Ketones, ur  NEGATIVE mg/dL    THIS TEST WAS ORDERED IN ERROR AND HAS BEEN CREDITED.    Comment: CORRECTED ON 08/05 AT 5093: PREVIOUSLY REPORTED AS NEGATIVE   Protein, ur  NEGATIVE mg/dL    THIS TEST WAS ORDERED IN ERROR AND HAS BEEN CREDITED.    Comment: CORRECTED ON 08/05 AT 1549: PREVIOUSLY REPORTED AS 30   Nitrite  NEGATIVE    THIS TEST WAS ORDERED IN ERROR AND HAS BEEN CREDITED.    Comment: CORRECTED ON 08/05 AT 1549: PREVIOUSLY REPORTED AS NEGATIVE   Leukocytes, UA  NEGATIVE    THIS TEST WAS ORDERED IN ERROR AND HAS BEEN CREDITED.    Comment: CORRECTED ON 08/05 AT 1549: PREVIOUSLY REPORTED AS SMALL   Urine-Other      THIS TEST WAS  ORDERED IN ERROR AND HAS BEEN CREDITED.  Urinalysis, Routine w reflex microscopic     Status: Abnormal   Collection Time: 07/15/17  2:15 PM  Result Value Ref Range   Color, Urine YELLOW YELLOW   APPearance TURBID (A) CLEAR   Specific Gravity, Urine 1.021 1.005 - 1.030   pH 5.0 5.0 - 8.0   Glucose, UA NEGATIVE NEGATIVE mg/dL   Hgb urine dipstick MODERATE (A) NEGATIVE   Bilirubin Urine NEGATIVE NEGATIVE   Ketones, ur NEGATIVE NEGATIVE mg/dL   Protein, ur 30 (A) NEGATIVE mg/dL   Nitrite NEGATIVE NEGATIVE   Leukocytes, UA SMALL (A) NEGATIVE   RBC / HPF TOO NUMEROUS TO COUNT 0 - 5 RBC/hpf   WBC, UA TOO NUMEROUS TO COUNT 0 - 5 WBC/hpf   Bacteria, UA NONE SEEN NONE SEEN   Squamous Epithelial / LPF 0-5 (A) NONE SEEN   Uric Acid Crys, UA PRESENT   Glucose, capillary     Status: Abnormal   Collection Time: 07/15/17  4:41 PM  Result Value Ref Range   Glucose-Capillary 269 (H) 65 - 99 mg/dL  Glucose, capillary     Status: Abnormal   Collection Time: 07/15/17  9:37 PM  Result Value Ref Range   Glucose-Capillary 240 (H) 65 - 99 mg/dL   Comment 1 Notify RN   Hemoglobin A1c     Status: Abnormal   Collection Time: 07/16/17  3:43 AM  Result Value Ref Range   Hgb A1c MFr Bld 7.9 (H) 4.8 - 5.6 %    Comment: (NOTE)         Pre-diabetes: 5.7 - 6.4         Diabetes: >6.4         Glycemic control for adults with diabetes: <7.0    Mean Plasma Glucose 180 mg/dL    Comment: (NOTE) Performed At: [facility] [place], [place] [id number] Lindon Romp MD DX:[id number]   Basic metabolic panel     Status: Abnormal   Collection Time: 07/16/17  3:43 AM  Result Value Ref Range   Sodium 135 135 - 145 mmol/L   Potassium 4.3 3.5 - 5.1 mmol/L   Chloride 105 101 - 111 mmol/L   CO2 23 22 - 32 mmol/L   Glucose, Bld 298 (H) 65 - 99 mg/dL   BUN 40 (H) 6 - 20 mg/dL   Creatinine, Ser 1.48 (H) 0.61 - 1.24 mg/dL   Calcium 8.9 8.9 - 10.3 mg/dL   GFR  calc non Af Amer 45 (L) >60 mL/min    GFR calc Af Amer 52 (L) >60 mL/min    Comment: (NOTE) The eGFR has been calculated using the CKD EPI equation. This calculation has not been validated in all clinical situations. eGFR's persistently <60 mL/min signify possible Chronic Kidney Disease.    Anion gap 7 5 - 15  CBC     Status: Abnormal   Collection Time: 07/16/17  3:43 AM  Result Value Ref Range   WBC 9.4 4.0 - 10.5 K/uL   RBC 4.57 4.22 - 5.81 MIL/uL   Hemoglobin 13.4 13.0 - 17.0 g/dL   HCT 42.1 39.0 - 52.0 %   MCV 92.1 78.0 - 100.0 fL   MCH 29.3 26.0 - 34.0 pg   MCHC 31.8 30.0 - 36.0 g/dL   RDW 17.6 (H) 11.5 - 15.5 %   Platelets 150 150 - 400 K/uL  Magnesium     Status: None   Collection Time: 07/16/17  3:43 AM  Result Value Ref Range   Magnesium 1.8 1.7 - 2.4 mg/dL  Lipid panel     Status: None   Collection Time: 07/16/17  3:43 AM  Result Value Ref Range   Cholesterol 103 0 - 200 mg/dL    Comment:        ATP III CLASSIFICATION:  <200     mg/dL   Desirable  200-239  mg/dL   Borderline High  >=240    mg/dL   High           Triglycerides 54 <150 mg/dL   HDL 42 >40 mg/dL   Total CHOL/HDL Ratio 2.5 RATIO   VLDL 11 0 - 40 mg/dL   LDL Cholesterol 50 0 - 99 mg/dL    Comment:        Total Cholesterol/HDL:CHD Risk Coronary Heart Disease Risk Table                     Men   Women  1/2 Average Risk   3.4   3.3  Average Risk       5.0   4.4  2 X Average Risk   9.6   7.1  3 X Average Risk  23.4   11.0        Use the calculated Patient Ratio above and the CHD Risk Table to determine the patient's CHD Risk.        ATP III CLASSIFICATION (LDL):  <100     mg/dL   Optimal  100-129  mg/dL   Near or Above                    Optimal  130-159  mg/dL   Borderline  160-189  mg/dL   High  >190     mg/dL   Very High   Lactic acid, plasma     Status: None   Collection Time: 07/16/17  3:43 AM  Result Value Ref Range   Lactic Acid, Venous 1.5 0.5 - 1.9 mmol/L  Glucose, capillary     Status: Abnormal   Collection  Time: 07/16/17  7:53 AM  Result Value Ref Range   Glucose-Capillary 215 (H) 65 - 99 mg/dL  Glucose, capillary     Status: Abnormal   Collection Time: 07/16/17 11:41 AM  Result Value Ref Range   Glucose-Capillary 272 (H) 65 - 99 mg/dL  Glucose, capillary     Status: Abnormal   Collection Time: 07/16/17  4:41 PM  Result Value Ref Range   Glucose-Capillary 152 (H) 65 - 99 mg/dL  Glucose, capillary     Status: Abnormal   Collection Time: 07/16/17  9:17 PM  Result Value Ref Range   Glucose-Capillary 170 (H) 65 - 99 mg/dL  CBC with Differential/Platelet     Status: Abnormal   Collection Time: 07/17/17  5:47 AM  Result Value Ref Range   WBC 10.9 (H) 4.0 - 10.5 K/uL   RBC 4.65 4.22 - 5.81 MIL/uL   Hemoglobin 13.5 13.0 - 17.0 g/dL   HCT 43.0 39.0 - 52.0 %   MCV 92.5 78.0 - 100.0 fL   MCH 29.0 26.0 - 34.0 pg   MCHC 31.4 30.0 - 36.0 g/dL   RDW 17.5 (H) 11.5 - 15.5 %   Platelets 146 (L) 150 - 400 K/uL   Neutrophils Relative % 88 %   Neutro Abs 9.6 (H) 1.7 - 7.7 K/uL   Lymphocytes Relative 6 %   Lymphs Abs 0.6 (L) 0.7 - 4.0 K/uL   Monocytes Relative 6 %   Monocytes Absolute 0.7 0.1 - 1.0 K/uL   Eosinophils Relative 0 %   Eosinophils Absolute 0.0 0.0 - 0.7 K/uL   Basophils Relative 0 %   Basophils Absolute 0.0 0.0 - 0.1 K/uL  Basic metabolic panel     Status: Abnormal   Collection Time: 07/17/17  5:47 AM  Result Value Ref Range   Sodium 136 135 - 145 mmol/L   Potassium 4.4 3.5 - 5.1 mmol/L   Chloride 103 101 - 111 mmol/L   CO2 26 22 - 32 mmol/L   Glucose, Bld 228 (H) 65 - 99 mg/dL   BUN 37 (H) 6 - 20 mg/dL   Creatinine, Ser 1.55 (H) 0.61 - 1.24 mg/dL   Calcium 9.4 8.9 - 10.3 mg/dL   GFR calc non Af Amer 42 (L) >60 mL/min   GFR calc Af Amer 49 (L) >60 mL/min    Comment: (NOTE) The eGFR has been calculated using the CKD EPI equation. This calculation has not been validated in all clinical situations. eGFR's persistently <60 mL/min signify possible Chronic Kidney Disease.     Anion gap 7 5 - 15  Glucose, capillary     Status: Abnormal   Collection Time: 07/17/17  8:09 AM  Result Value Ref Range   Glucose-Capillary 187 (H) 65 - 99 mg/dL    ABGS  Recent Labs  07/15/17 1000  PHART 7.388  PO2ART 82.3*  HCO3 22.1   CULTURES Recent Results (from the past 240 hour(s))  Blood Culture (routine x 2)     Status: None (Preliminary result)   Collection Time: 07/13/17  9:00 PM  Result Value Ref Range Status   Specimen Description BLOOD RIGHT FOREARM  Final   Special Requests   Final    Blood Culture results may not be optimal due to an inadequate volume of blood received in culture bottles   Culture NO GROWTH 4 DAYS  Final   Report Status PENDING  Incomplete  Blood Culture (routine x 2)     Status: None (Preliminary result)   Collection Time: 07/13/17  9:00 PM  Result Value Ref Range Status   Specimen Description BLOOD LEFT FOREARM  Final   Special Requests   Final    Blood Culture results may not be optimal due to an inadequate volume of blood received in culture bottles   Culture NO GROWTH 4 DAYS  Final   Report Status PENDING  Incomplete  MRSA PCR Screening     Status: None   Collection Time: 07/14/17 12:46 AM  Result Value Ref Range Status   MRSA by PCR NEGATIVE NEGATIVE Final    Comment:        The GeneXpert MRSA Assay (FDA approved for NASAL specimens only), is one component of a comprehensive MRSA colonization surveillance program. It is not intended to diagnose MRSA infection nor to guide or monitor treatment for MRSA infections.    Studies/Results: Nm Pulmonary Perf And Vent  Result Date: 07/15/2017 CLINICAL DATA:  Concern for pulmonary embolism. Short breath for 1 week. Renal insufficiency. EXAM: NUCLEAR MEDICINE VENTILATION - PERFUSION LUNG SCAN TECHNIQUE: Ventilation images were obtained in multiple projections using inhaled aerosol Tc-25mDTPA. Perfusion images were obtained in multiple projections after intravenous injection of Tc-933mMAA. RADIOPHARMACEUTICALS:  Thirty mCi Technetium-9956mPA aerosol inhalation and 4 mCi Technetium-65m34m IV COMPARISON:  None. FINDINGS: Ventilation: Multiple peripheral ventilation defects. Decreased ventilation in the RIGHT lower lobe. Heterogeneous ventilation MRI. Perfusion: There several perfusion defects which are match by ventilation defect. For example centrally in the RIGHT lower lobe away from the border which is matched by the ventilation defect. There is poor ventilation to the RIGHT lower lobe as well as poor perfusion which likely relates to effusion on comparison radiograph. There is no wedge-shaped peripheral perfusion defect which are unmatched to suggest acute pulmonary embolism IMPRESSION: 1. No wedge-shaped peripheral perfusion defect which are unmatched to suggest acute pulmonary embolism. 2. Multiple perfusion defects are matched by ventilation defects most consistent with COPD. 3. Decreased profusion and ventilation to the RIGHT lung base correlates with pleural effusion. Electronically Signed   By: StewSuzy Bouchard.   On: 07/15/2017 13:31   Us VKoreaous Img Lower Bilateral  Result Date: 07/15/2017 CLINICAL DATA:  Lower extremity swelling. Hypoxia. Previous history of DVT and pulmonary embolism. EXAM: BILATERAL LOWER EXTREMITY VENOUS DOPPLER ULTRASOUND TECHNIQUE: Gray-scale sonography with graded compression, as well as color Doppler and duplex ultrasound were performed to evaluate the lower extremity deep venous systems from the level of the common femoral vein and including the common femoral, femoral, profunda femoral, popliteal and calf veins including the posterior tibial, peroneal and gastrocnemius veins when visible. The superficial great saphenous vein was also interrogated. Spectral Doppler was utilized to evaluate flow at rest and with distal augmentation maneuvers in the common femoral, femoral and popliteal veins. COMPARISON:  None. FINDINGS: RIGHT LOWER EXTREMITY Common  Femoral Vein: No evidence of thrombus. Normal compressibility, respiratory phasicity and response to augmentation. Saphenofemoral Junction: No evidence of thrombus. Normal compressibility and flow on color Doppler imaging. Profunda Femoral Vein: No evidence of thrombus. Normal compressibility and flow on color Doppler imaging. Femoral Vein: No evidence of thrombus. Normal compressibility, respiratory phasicity and response to augmentation. Popliteal Vein: No evidence of thrombus. Normal compressibility, respiratory phasicity and response to augmentation. Calf Veins: No evidence of thrombus. Normal compressibility and flow on color Doppler imaging. Superficial Great Saphenous Vein: No evidence of thrombus. Normal compressibility and flow on color Doppler imaging. Venous Reflux:  None. Other Findings:  None. LEFT LOWER EXTREMITY Common Femoral Vein: No evidence of thrombus. Normal compressibility, respiratory phasicity and response to augmentation. Saphenofemoral Junction: No evidence of thrombus. Normal compressibility and flow on color Doppler imaging. Profunda Femoral Vein: No evidence of thrombus. Normal compressibility and flow on color Doppler imaging. Femoral Vein: No evidence of thrombus. Normal compressibility, respiratory phasicity and response to augmentation. Popliteal Vein: No evidence of thrombus. Normal compressibility, respiratory  phasicity and response to augmentation. Calf Veins: No evidence of thrombus. Normal compressibility and flow on color Doppler imaging. Superficial Great Saphenous Vein: No evidence of thrombus. Normal compressibility and flow on color Doppler imaging. Venous Reflux:  None. Other Findings:  None. IMPRESSION: No evidence of DVT within either lower extremity. Electronically Signed   By: Earle Gell M.D.   On: 07/15/2017 12:36   Dg Chest Port 1 View  Result Date: 07/17/2017 CLINICAL DATA:  Difficulty breathing and productive cough for 1 week. Multiple medical problems. EXAM:  PORTABLE CHEST 1 VIEW COMPARISON:  07/15/2017 FINDINGS: The heart remains moderately enlarged. Vascular congestion is stable. Bibasilar opacity, likely a combination of pleural fluid and volume loss is stable on the left and increased on the right. No pneumothorax. IMPRESSION: Stable opacity at the left base and increasing opacity at the right base. This findings likely are a combination of pleural fluid and volume loss. Stable vascular congestion. Electronically Signed   By: Marybelle Killings M.D.   On: 07/17/2017 07:55   Dg Chest Port 1 View  Result Date: 07/15/2017 CLINICAL DATA:  Hypoxia EXAM: PORTABLE CHEST 1 VIEW COMPARISON:  CT from 2 days ago FINDINGS: Chronic cardiopericardial enlargement. Chronic widening of upper mediastinal contours attributed to ectatic vessels based on recent CT. Small pleural effusions. Hyperinflation and emphysematous changes. No Kerley lines or air bronchogram. Chronic enlargement of pulmonary arteries. IMPRESSION: 1. Small pleural effusions that have mildly increased from 2 days ago. 2. Emphysema. 3. Chronic cardiomegaly. Large pulmonary arteries suggesting hypertension. Electronically Signed   By: Monte Fantasia M.D.   On: 07/15/2017 10:50    Medications:  Prior to Admission:  Prescriptions Prior to Admission  Medication Sig Dispense Refill Last Dose  . albuterol (PROAIR HFA) 108 (90 Base) MCG/ACT inhaler Inhale 1-2 puffs into the lungs every 6 (six) hours as needed for wheezing or shortness of breath.   Past Week at Unknown time  . albuterol (PROVENTIL) (2.5 MG/3ML) 0.083% nebulizer solution Take 3 mLs (2.5 mg total) by nebulization every 6 (six) hours as needed for wheezing or shortness of breath. 75 mL 12 Past Week at Unknown time  . aspirin EC 81 MG tablet Take 81 mg by mouth daily.   07/13/2017 at Unknown time  . budesonide-formoterol (SYMBICORT) 160-4.5 MCG/ACT inhaler Inhale 2 puffs into the lungs 2 (two) times daily as needed (for shortness of breath).    Past Week  at Unknown time  . furosemide (LASIX) 40 MG tablet Take 1 tablet (40 mg total) by mouth 2 (two) times daily. 60 tablet 5 07/13/2017 at Unknown time  . glipiZIDE (GLUCOTROL) 5 MG tablet Take 5 mg by mouth 2 (two) times daily.   07/13/2017 at Unknown time  . lisinopril (PRINIVIL,ZESTRIL) 40 MG tablet Take 40 mg by mouth daily.   07/13/2017 at Unknown time  . lovastatin (MEVACOR) 40 MG tablet Take 40 mg by mouth at bedtime. Pt should take with food.    07/12/2017 at Unknown time  . metFORMIN (GLUCOPHAGE) 500 MG tablet Take 500 mg by mouth 2 (two) times daily with a meal.   07/13/2017 at Unknown time  . metoprolol tartrate (LOPRESSOR) 25 MG tablet Take 12.5 mg by mouth 2 (two) times daily.    07/13/2017 at 730a  . omeprazole (PRILOSEC) 40 MG capsule Take 40 mg by mouth daily.   07/13/2017 at Unknown time  . polyethylene glycol (MIRALAX) packet Take 17 g by mouth daily as needed for moderate constipation. 30 each 0 unknown  .  nitroGLYCERIN (NITROSTAT) 0.4 MG SL tablet Place 0.4 mg under the tongue every 5 (five) minutes x 3 doses as needed for chest pain. If no relief call 911 or go to emergency room.   unknown   Scheduled: . aspirin EC  81 mg Oral Daily  . budesonide (PULMICORT) nebulizer solution  0.5 mg Nebulization BID  . fluticasone  2 spray Each Nare Daily  . furosemide  20 mg Intravenous Q12H  . guaiFENesin  1,200 mg Oral BID  . heparin subcutaneous  5,000 Units Subcutaneous Q8H  . insulin aspart  0-15 Units Subcutaneous TID WC  . insulin glargine  10 Units Subcutaneous BH-q7a  . ipratropium-albuterol  3 mL Nebulization Q6H  . loratadine  10 mg Oral Daily  . methylPREDNISolone (SOLU-MEDROL) injection  60 mg Intravenous Daily  . metoprolol tartrate  12.5 mg Oral BID  . pantoprazole  40 mg Oral Daily  . pravastatin  40 mg Oral q1800  . sildenafil  20 mg Oral TID   Continuous: . piperacillin-tazobactam (ZOSYN)  IV Stopped (07/17/17 0742)  . sodium chloride     MDE:[id number], LORazepam,  nitroGLYCERIN, polyethylene glycol  Assesment: He was admitted with acute hypoxic respiratory failure. He was septic on admission. He hasn't used BiPAP. He also has what appears to be acute on chronic heart failure. He is improving. He has elevated pulmonary artery pressure on echocardiogram and he was started on Revatio yesterday. He seems to be slowly improving. He's coughing up a lot of sputum. He has history of pulmonary emboli but did not show pulmonary emboli on current CT scan. Principal Problem:   Acute respiratory failure with hypoxia (HCC) Active Problems:   Type 2 diabetes mellitus (HCC)   HLD (hyperlipidemia)   GERD (gastroesophageal reflux disease)   Essential hypertension   Chronic pulmonary embolism (HCC)   COPD exacerbation (HCC)   Sepsis due to undetermined organism with acute respiratory failure (HCC)   Ascending aortic aneurysm (HCC)   Elevated bilirubin   Left nephrolithiasis   Lactic acidosis   Moderate to severe pulmonary hypertension (HCC)--per 2-D echo 07/14/2017   Mass of lung   Acute lower UTI   Sepsis ([place])    Plan: Continue current treatments he is slowly improving    LOS: 4 days   Rashidah Belleville L 07/17/2017, 8:41 AM

## 2017-07-17 NOTE — Plan of Care (Signed)
Problem: Cardiac: Goal: Ability to achieve and maintain adequate cardiopulmonary perfusion will improve Outcome: Progressing Patient in afib this morning and started on Cardizem drip with bolus. Heart rate improving.

## 2017-07-17 NOTE — Progress Notes (Signed)
PROGRESS NOTE    Austin [name]  TTS:[id number] DOB: 10-30-1941 DOA: 07/13/2017 PCP: [facility]    Brief Narrative:  76 year old male past oral history of COPD, diastolic heart failure and chronic atrial fibrillation brought into the emergency room on 8/4 for several days of progressively worsening shortness of breath. On day of admission, patient had been outside to feed his dog, but stopped in the mother and was unable to get up by himself or with Mariel Kansky assistance. EMS was called and patient was reportedly hypoxic with decreased level of consciousness. Patient was found to be in sepsis and acute respiratory failure requiring BiPAP. Brought into the hospitalist service. BNP also noted to be elevated at 1769. Patient started on IV fluids, antibiotics and admitted to the hospitalist service in the stepdown unit.  Assessment & Plan:   Principal Problem:   Acute respiratory failure with hypoxia (HCC) Active Problems:   Sepsis due to undetermined organism with acute respiratory failure (HCC)   Type 2 diabetes mellitus (HCC)   HLD (hyperlipidemia)   GERD (gastroesophageal reflux disease)   Essential hypertension   Chronic pulmonary embolism (HCC)   COPD exacerbation (HCC)   Ascending aortic aneurysm (HCC)   Elevated bilirubin   Left nephrolithiasis   Lactic acidosis   Moderate to severe pulmonary hypertension (HCC)--per 2-D echo 07/14/2017   Mass of lung   Acute lower UTI   Sepsis ([place])  #1 acute respiratory failure with hypoxia/volume overload Patient presenting with acute respiratory failure with hypoxia initially requiring BiPAP. Patient now on high flow nasal cannula at 3 L and sitting up in the chair. Unknown etiology. Patient with prior history of DVT and PE and not on anticoagulation prior to admission due to history of GI bleed.  Patient also with COPD with ongoing tobacco use. 2-D echo which was done with a EF of 65-70% however dynamic obstruction  at rest and mid cavity with severely dilated right ventricular cavity size, systolic function moderately to severely reduced on the right which is worse than previous 2-D echo and worsening pulmonary artery systolic pressure with a PA peak pressure of 81 mmHg. Patient also noted to have labile blood pressure with systolics in the 09Q the night of 07/14/2017, however on IV fluids systolic blood pressure improved. IV fluids have been saline locked. VQ scan negative for acute PE. Lower extremity Dopplers negative for DVT. ABG had a pH of 7.38, PCO2 of 36, PO2 of 82, bicarbonate of 22. Chest x-ray done 07/15/2017 with small pleural effusions that have mildly increased from 2 days ago, emphysema, chronic cardiomegaly, large pulmonary artery suggesting hypertension. Continue IV Solu-Medrol to 60 mg daily. Discontinued dulera, and placed on Pulmicort. Continue Mucinex, Claritin. Continue empiric IV antibiotics.  Will increase Lasix 40 mg IV every 12 hours and follow urine output, daily weights, hypoxia due to concerns for volume overload. Pulmonary has been consulted and appreciate input and recommendations.   #2 sepsis due to undetermined organism with acute respiratory failure with hypoxia/UTI On admission patient met criteria for sepsis with a lactic acidosis of 7.5 which is trended down however still elevated at 2.7. Patient also noted to have a leukocytosis with a white count of 21.2, concern for respiratory source with end organ failure. Patient responded to IV fluids. Patient currently afebrile. Leukocytosis trending down slowly. Patient has been pancultured results pending. MRSA PCR negative. Urinalysis done 07/15/2017 worrisome for UTI. Urine cultures pending. Continue empiric IV Zosyn. Discontinued IV vancomycin.  #3 abnormal  2-D echo 2-D echo with EF of 65-70%, dynamic obstruction addressed the mid cavity with grade 1 diastolic dysfunction, severely dilated right ventricular cavity size and moderately to  severely reduced right ventricular function as well as right atrium severely dilated and severely increased PA peak pressure of 81 mmHg. Patient did have some labile blood pressure with some systolics in the 09G and 28Z overnight however on IV fluids systolic blood pressure improved . Patient with prior history of DVT and PE. Patient also did have a prior history of IVC filter that have been removed per family. Patient not anticoagulation candidate due to history of GI Bleed.  #4 prior history of PE and DVT Patient not on anticoagulation prior to admission. Patient states did not follow-up with one of his doctors and thus the reason why he is not on anticoagulation at this time. Due to patient's respiratory status and presentation and concern for DVT and PE  VQ scan was checked which was negative for pulmonary emboli. Lower extremity Dopplers were also negative for DVT. IV heparin has been discontinued as been noted that patient had a prior history of GI bleed while on anticoagulation per family.   #5 severe pulmonary hypertension VQ scan negative for acute PE. Pulmonary consulted.Treat COPD. patient has been started on revatio per pulmonary. Will likely need outpatient follow-up.  #6 diabetes mellitus type 2 Discontinued glipizide. CBGs have ranged from 152-187. Patient was on IV Solu-Medrol. Continue Lantus to 10 units daily. Continue sliding scale insulin. Hemoglobin A1c = 7.9. Monitor CBGs as IV steroids are being weaned. Follow.  #7 gastroesophageal reflux disease PPI.  #8 hypertension Blood pressure improved. Patient on Lopressor. Monitor closely as patient is being placed on a Cardizem drip for A. fib with RVR.  #9 COPD exacerbation Patient on home O2 as needed. Patient complaining of tightness this morning with congestion. No wheezing noted on examination. Continue Solu-Medrol to 60 mg IV every day. Discontinued dulera. Continue Pulmicort, Claritin, Flonase, Mucinex. Discontinue duo nebs  due to A. fib with RVR and place on Xopenex and Atrovent nebs. Continue empiric IV antibiotics of Zosyn. Pulmonary following. Follow.  #10 chronic atrial fibrillation CHA2DS2Vasc score 6 Patient currently rate now in A. fib with RVR with heart rates distending the 140s after breathing treatment this morning per nursing. Continue metoprolol. Change DuoNeb to Xopenex and Atrovent nebs. Placed on a Cardizem drip for rate control. Continue metoprolol. Patient noted to have a prior history of significant GI bleed per family while on anticoagulation and deemed not a long-term anticoagulation candidate.   #11 acute on chronic vs chronic diastolic heart failure/pulmonary edema Patient noted to have elevated BNP on admission however patient also with chronic kidney disease stage II. Patient also noted to be hypoxic and concerns for pleural effusion on chest x-ray. Repeat chest x-ray with stable vascular congestion and stable opacity at left base and increasing opacity at right base findings likely combination of pleural fluid and volume loss. 2-D echo with a normal EF however grade 2 diastolic dysfunction noted with severe pulmonary hypertension, moderately to severely reduced right ventricular function, dilated right atrium, severely dilated right ventricular cavity size. Due to liable blood pressure diuretics were initially held. Patient was placed on Lasix 20 mg IV every 12 hours with a urine output of 1 L over the past 24 hours. Will increase Lasix to 40 mg IV every 12 hours and follow output and renal function. Strict I's and O's. Daily weights.  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 Follow.  #13 incidental aortic ascending aneurysm Noted on CT. Outpatient follow-up.  #14 stable spiculated masses right middle lobe and right upper lobe Per CT scan chest without contrast 07/13/2017 which stated that given the long-term stability, benign processes likely favored. Patient will need ongoing  outpatient follow-up with pulmonary.  #15 UTI Urine cultures pending. Continue IV Zosyn.    DVT prophylaxis: Heparin Code Status: Full Family Communication: Updated patient. No family at bedside. Disposition Plan: Remain in step down unit, while hypoxic and on high flow nasal cannula and in A. fib with RVR.   Consultants:   Pulmonary: Dr. Luan Pulling 07/16/2017  Procedures:   Chest x-ray 07/13/2017  Plain films of the right tib-fib 07/13/2017  CT abdomen and pelvis 07/13/2017  CT chest without contrast 07/13/2017  2-D echo 07/14/2017--- EF 65-70%, dynamic obstruction at rest and mid cavity, grade 1 diastolic dysfunction, severely dilated right ventricular cavity size, systolic function moderately to severely reduced, right atrium severely dilated, pulmonary artery systolic pressure severely increased PA peak pressure of 81 mmHg.  VQ scan 07/15/2017  Lower Extremity Dopplers 07/15/2017  Antimicrobials:   IV Zosyn 07/13/2017  IV vancomycin 07/13/2017>>>>> 07/16/2017   Subjective: Patient in bed. Patient states some improvement with shortness of breath however complaining of congestion and cough. Patient still on high flow O2. Patient noted to be in A. fib with RVR with heart rates in the 140s after breathing treatment this morning per nursing.   Objective: Vitals:   07/17/17 0500 07/17/17 0600 07/17/17 0835 07/17/17 0839  BP: 116/75 120/86    Pulse: (!) 56 (!) 57    Resp: 18 17    Temp:      TempSrc:      SpO2: 95% 95% 96% 96%  Weight: 97.4 kg (214 lb 11.7 oz)     Height:        Intake/Output Summary (Last 24 hours) at 07/17/17 0849 Last data filed at 07/17/17 0500  Gross per 24 hour  Intake              530 ml  Output              850 ml  Net             -320 ml   Filed Weights   07/15/17 0500 07/16/17 0500 07/17/17 0500  Weight: 89.9 kg (198 lb 3.1 oz) 97.4 kg (214 lb 11.7 oz) 97.4 kg (214 lb 11.7 oz)    Examination:  General exam: Sitting up in  bed. Respiratory system: Coarse/Ronchorus BS. Bibasilar crackles. Poor to fair air movement. No wheezing. Minimal use of accessory muscles of respiration Cardiovascular system: Irregularly irregular. + JVD, murmurs, rubs, gallops or clicks.1-2+ right lower extremity edema. 1-2+ left lower extremity edema. Gastrointestinal system: Abdomen is nondistended, soft and nontender. No organomegaly or masses felt. Normal bowel sounds heard. Central nervous system: Alert and oriented. No focal neurological deficits. Extremities: Symmetric 5 x 5 power. Skin: Right lower extremity with some erythema, 1-2+ edema, small ulceration noted.  Psychiatry: Judgement and insight appear normal. Mood & affect appropriate.     Data Reviewed: I have personally reviewed following labs and imaging studies  CBC:  Recent Labs Lab 07/13/17 1901 07/14/17 0443 07/15/17 0446 07/16/17 0343 07/17/17 0547  WBC 21.2* 18.5* 14.2* 9.4 10.9*  NEUTROABS 18.8* 17.2* 12.2*  --  9.6*  HGB 15.4 14.4 13.0 13.4 13.5  HCT 49.5 45.5 40.1 42.1 43.0  MCV 95.2 92.7 91.6 92.1  92.5  PLT 159 161 154 150 094*   Basic Metabolic Panel:  Recent Labs Lab 07/13/17 1901 07/14/17 0443 07/15/17 0446 07/16/17 0343 07/17/17 0547  NA 137 138 135 135 136  K 4.8 4.6 4.0 4.3 4.4  CL 103 103 101 105 103  CO2 16* 24 24 23 26   GLUCOSE 130* 160* 199* 298* 228*  BUN 33* 39* 44* 40* 37*  CREATININE 1.90* 1.85* 1.72* 1.48* 1.55*  CALCIUM 9.7 9.6 8.8* 8.9 9.4  MG  --   --   --  1.8  --    GFR: Estimated Creatinine Clearance: 49.8 mL/min (A) (by C-G formula based on SCr of 1.55 mg/dL (H)). Liver Function Tests:  Recent Labs Lab 07/13/17 1901 07/14/17 0443  AST 46* 37  ALT 15* 12*  ALKPHOS 76 71  BILITOT 2.1* 1.9*  PROT 6.8 6.0*  ALBUMIN 3.6 3.3*   No results for input(s): LIPASE, AMYLASE in the last 168 hours. No results for input(s): AMMONIA in the last 168 hours. Coagulation Profile: No results for input(s): INR, PROTIME in  the last 168 hours. Cardiac Enzymes: No results for input(s): CKTOTAL, CKMB, CKMBINDEX, TROPONINI in the last 168 hours. BNP (last 3 results) No results for input(s): PROBNP in the last 8760 hours. HbA1C:  Recent Labs  07/14/17 1509 07/16/17 0343  HGBA1C 7.9* 7.9*   CBG:  Recent Labs Lab 07/16/17 0753 07/16/17 1141 07/16/17 1641 07/16/17 2117 07/17/17 0809  GLUCAP 215* 272* 152* 170* 187*   Lipid Profile:  Recent Labs  07/16/17 0343  CHOL 103  HDL 42  LDLCALC 50  TRIG 54  CHOLHDL 2.5   Thyroid Function Tests: No results for input(s): TSH, T4TOTAL, FREET4, T3FREE, THYROIDAB in the last 72 hours. Anemia Panel: No results for input(s): VITAMINB12, FOLATE, FERRITIN, TIBC, IRON, RETICCTPCT in the last 72 hours. Sepsis Labs:  Recent Labs Lab 07/14/17 1509 07/14/17 2052 07/15/17 0446 07/16/17 0343  LATICACIDVEN 3.3* 2.6* 2.7* 1.5    Recent Results (from the past 240 hour(s))  Blood Culture (routine x 2)     Status: None (Preliminary result)   Collection Time: 07/13/17  9:00 PM  Result Value Ref Range Status   Specimen Description BLOOD RIGHT FOREARM  Final   Special Requests   Final    Blood Culture results may not be optimal due to an inadequate volume of blood received in culture bottles   Culture NO GROWTH 4 DAYS  Final   Report Status PENDING  Incomplete  Blood Culture (routine x 2)     Status: None (Preliminary result)   Collection Time: 07/13/17  9:00 PM  Result Value Ref Range Status   Specimen Description BLOOD LEFT FOREARM  Final   Special Requests   Final    Blood Culture results may not be optimal due to an inadequate volume of blood received in culture bottles   Culture NO GROWTH 4 DAYS  Final   Report Status PENDING  Incomplete  MRSA PCR Screening     Status: None   Collection Time: 07/14/17 12:46 AM  Result Value Ref Range Status   MRSA by PCR NEGATIVE NEGATIVE Final    Comment:        The GeneXpert MRSA Assay (FDA approved for NASAL  specimens only), is one component of a comprehensive MRSA colonization surveillance program. It is not intended to diagnose MRSA infection nor to guide or monitor treatment for MRSA infections.          Radiology Studies: Nm  Pulmonary Perf And Vent  Result Date: 07/15/2017 CLINICAL DATA:  Concern for pulmonary embolism. Short breath for 1 week. Renal insufficiency. EXAM: NUCLEAR MEDICINE VENTILATION - PERFUSION LUNG SCAN TECHNIQUE: Ventilation images were obtained in multiple projections using inhaled aerosol Tc-53mDTPA. Perfusion images were obtained in multiple projections after intravenous injection of Tc-955mAA. RADIOPHARMACEUTICALS:  Thirty mCi Technetium-9961mPA aerosol inhalation and 4 mCi Technetium-41m42m IV COMPARISON:  None. FINDINGS: Ventilation: Multiple peripheral ventilation defects. Decreased ventilation in the RIGHT lower lobe. Heterogeneous ventilation MRI. Perfusion: There several perfusion defects which are match by ventilation defect. For example centrally in the RIGHT lower lobe away from the border which is matched by the ventilation defect. There is poor ventilation to the RIGHT lower lobe as well as poor perfusion which likely relates to effusion on comparison radiograph. There is no wedge-shaped peripheral perfusion defect which are unmatched to suggest acute pulmonary embolism IMPRESSION: 1. No wedge-shaped peripheral perfusion defect which are unmatched to suggest acute pulmonary embolism. 2. Multiple perfusion defects are matched by ventilation defects most consistent with COPD. 3. Decreased profusion and ventilation to the RIGHT lung base correlates with pleural effusion. Electronically Signed   By: StewSuzy Bouchard.   On: 07/15/2017 13:31   Us VK[place]ous Img Lower Bilateral  Result Date: 07/15/2017 CLINICAL DATA:  Lower extremity swelling. Hypoxia. Previous history of DVT and pulmonary embolism. EXAM: BILATERAL LOWER EXTREMITY VENOUS DOPPLER ULTRASOUND  TECHNIQUE: Gray-scale sonography with graded compression, as well as color Doppler and duplex ultrasound were performed to evaluate the lower extremity deep venous systems from the level of the common femoral vein and including the common femoral, femoral, profunda femoral, popliteal and calf veins including the posterior tibial, peroneal and gastrocnemius veins when visible. The superficial great saphenous vein was also interrogated. Spectral Doppler was utilized to evaluate flow at rest and with distal augmentation maneuvers in the common femoral, femoral and popliteal veins. COMPARISON:  None. FINDINGS: RIGHT LOWER EXTREMITY Common Femoral Vein: No evidence of thrombus. Normal compressibility, respiratory phasicity and response to augmentation. Saphenofemoral Junction: No evidence of thrombus. Normal compressibility and flow on color Doppler imaging. Profunda Femoral Vein: No evidence of thrombus. Normal compressibility and flow on color Doppler imaging. Femoral Vein: No evidence of thrombus. Normal compressibility, respiratory phasicity and response to augmentation. Popliteal Vein: No evidence of thrombus. Normal compressibility, respiratory phasicity and response to augmentation. Calf Veins: No evidence of thrombus. Normal compressibility and flow on color Doppler imaging. Superficial Great Saphenous Vein: No evidence of thrombus. Normal compressibility and flow on color Doppler imaging. Venous Reflux:  None. Other Findings:  None. LEFT LOWER EXTREMITY Common Femoral Vein: No evidence of thrombus. Normal compressibility, respiratory phasicity and response to augmentation. Saphenofemoral Junction: No evidence of thrombus. Normal compressibility and flow on color Doppler imaging. Profunda Femoral Vein: No evidence of thrombus. Normal compressibility and flow on color Doppler imaging. Femoral Vein: No evidence of thrombus. Normal compressibility, respiratory phasicity and response to augmentation. Popliteal Vein:  No evidence of thrombus. Normal compressibility, respiratory phasicity and response to augmentation. Calf Veins: No evidence of thrombus. Normal compressibility and flow on color Doppler imaging. Superficial Great Saphenous Vein: No evidence of thrombus. Normal compressibility and flow on color Doppler imaging. Venous Reflux:  None. Other Findings:  None. IMPRESSION: No evidence of DVT within either lower extremity. Electronically Signed   By: JohnEarle Gell.   On: 07/15/2017 12:36   Dg Chest Port 1 View  Result Date: 07/17/2017 CLINICAL DATA:  Difficulty breathing and  productive cough for 1 week. Multiple medical problems. EXAM: PORTABLE CHEST 1 VIEW COMPARISON:  07/15/2017 FINDINGS: The heart remains moderately enlarged. Vascular congestion is stable. Bibasilar opacity, likely a combination of pleural fluid and volume loss is stable on the left and increased on the right. No pneumothorax. IMPRESSION: Stable opacity at the left base and increasing opacity at the right base. This findings likely are a combination of pleural fluid and volume loss. Stable vascular congestion. Electronically Signed   By: Marybelle Killings M.D.   On: 07/17/2017 07:55   Dg Chest Port 1 View  Result Date: 07/15/2017 CLINICAL DATA:  Hypoxia EXAM: PORTABLE CHEST 1 VIEW COMPARISON:  CT from 2 days ago FINDINGS: Chronic cardiopericardial enlargement. Chronic widening of upper mediastinal contours attributed to ectatic vessels based on recent CT. Small pleural effusions. Hyperinflation and emphysematous changes. No Kerley lines or air bronchogram. Chronic enlargement of pulmonary arteries. IMPRESSION: 1. Small pleural effusions that have mildly increased from 2 days ago. 2. Emphysema. 3. Chronic cardiomegaly. Large pulmonary arteries suggesting hypertension. Electronically Signed   By: Monte Fantasia M.D.   On: 07/15/2017 10:50        Scheduled Meds: . aspirin EC  81 mg Oral Daily  . budesonide (PULMICORT) nebulizer solution  0.5  mg Nebulization BID  . fluticasone  2 spray Each Nare Daily  . furosemide  20 mg Intravenous Q12H  . guaiFENesin  1,200 mg Oral BID  . heparin subcutaneous  5,000 Units Subcutaneous Q8H  . insulin aspart  0-15 Units Subcutaneous TID WC  . insulin glargine  10 Units Subcutaneous BH-q7a  . ipratropium-albuterol  3 mL Nebulization Q6H  . loratadine  10 mg Oral Daily  . methylPREDNISolone (SOLU-MEDROL) injection  60 mg Intravenous Daily  . metoprolol tartrate  12.5 mg Oral BID  . pantoprazole  40 mg Oral Daily  . pravastatin  40 mg Oral q1800  . sildenafil  20 mg Oral TID   Continuous Infusions: . piperacillin-tazobactam (ZOSYN)  IV Stopped (07/17/17 0742)  . sodium chloride       LOS: 4 days    Time spent: 62 mins    Kandas Oliveto, MD [facility] Pager [phone]  If 7PM-7AM, please contact night-coverage [email or web address] Password [facility] 07/17/2017, 8:49 AM

## 2017-07-17 NOTE — Progress Notes (Signed)
Patient went into Afib this morning around 0830. Dr. Janee Mornhompson notified and patient was given Lasix and Metoprolol to see if his heart rate would decrease. After 30 minutes patient's heart rate was still elevated and Dr. Janee Mornhompson ordered a cardizem drip and a 10mg  bolus. Now patient heartrate is down to the 110-120s. Explained to patient and family the reason for the Cardizem drip. Will continue to monitor.

## 2017-07-17 NOTE — Evaluation (Signed)
Occupational Therapy Evaluation Patient Details Name: Austin MccreedyJames Allen [name] MRN: [id number] DOB: [birth year] Today's Date: 07/17/2017    History of Present Illness Austin MccreedyJames Allen Mcpheeters is a 76 y.o. male with medical history significant of asthma/COPD, chronic atrial fibrillation, diastolic CHF, hypertension, hyperlipidemia type 2 diabetes, history of DVT and PE, PUD, GERD who is brought to the emergency department via EMS due to progressively worse dyspnea for the past few days.   Clinical Impression   Pt in bed upon therapy arrival and agreeable to participate in OT evaluation. Patient's HR irregular during evaluation and OT monitored patient during all activity. Patient presents with decreased endurance and strength in the UB. Pt was able to complete a sit to stand at EOB and scoot towards head of bed when seated with increased time. Pt given handout for energy conservation and task modification. Recommend HHOT at discharge to work on UB strength and endurance to increase functional performance during ADL tasks.     Follow Up Recommendations  Home health OT    Equipment Recommendations  None recommended by OT       Precautions / Restrictions Precautions Precautions: Fall Precaution Comments: Hx of falls. Pt fell previously to admittance.      Mobility Bed Mobility Overal bed mobility: Modified Independent                Transfers Overall transfer level: Needs assistance Equipment used: None Transfers: Sit to/from Stand Sit to Stand: Supervision                  ADL either performed or assessed with clinical judgement   ADL    General ADL Comments: ADL tasks not assessed at this time due to unstable HR.     Vision Baseline Vision/History: No visual deficits Patient Visual Report: No change from baseline              Pertinent Vitals/Pain Pain Assessment: No/denies pain     Hand Dominance Right   Extremity/Trunk Assessment Upper Extremity  Assessment Upper Extremity Assessment: Generalized weakness   Lower Extremity Assessment Lower Extremity Assessment: Defer to PT evaluation       Communication Communication Communication: No difficulties   Cognition Arousal/Alertness: Awake/alert Behavior During Therapy: WFL for tasks assessed/performed Overall Cognitive Status: Within Functional Limits for tasks assessed                  Home Living Family/patient expects to be discharged to:: Private residence Living Arrangements: Spouse/significant other Available Help at Discharge: Family (pt lives with wife and 2 grandkids.) Type of Home: House Home Access: Stairs to enter Secretary/administratorntrance Stairs-Number of Steps: 2 Entrance Stairs-Rails: None Home Layout: One level     Bathroom Shower/Tub: Chief Strategy OfficerTub/shower unit   Bathroom Toilet: Standard     Home Equipment: Environmental consultantWalker - 2 wheels   Additional Comments: Pt states that he didn't use the 2 wheeled walker.       Prior Functioning/Environment Level of Independence: Independent        Comments: Patient reports that he is independent with ADLs at home. Grandson completes all yardwork. Pt continues to drive.                                   AM-PAC PT "6 Clicks" Daily Activity     Outcome Measure Help from another person eating meals?: None Help from another person taking care of personal grooming?: None Help from  another person toileting, which includes using toliet, bedpan, or urinal?: A Little Help from another person bathing (including washing, rinsing, drying)?: A Little Help from another person to put on and taking off regular upper body clothing?: A Little Help from another person to put on and taking off regular lower body clothing?: A Little 6 Click Score: 20   End of Session Equipment Utilized During Treatment: Oxygen  Activity Tolerance: Patient tolerated treatment well Patient left: in bed;with call bell/phone within reach  OT Visit Diagnosis:  Muscle weakness (generalized) (M62.81)                Time: 1635-1700 OT Time Calculation (min): 25 min Charges:  OT General Charges $OT Visit: 1 Procedure OT Evaluation $OT Eval Low Complexity: 1 Procedure G-Codes: OT G-codes **NOT FOR INPATIENT CLASS** Functional Assessment Tool Used: AM-PAC 6 Clicks Daily Activity Functional Limitation: Self care Self Care Current Status (A2130): At least 20 percent but less than 40 percent impaired, limited or restricted Self Care Goal Status (Q6578): At least 20 percent but less than 40 percent impaired, limited or restricted Self Care Discharge Status [phone]): At least 20 percent but less than 40 percent impaired, limited or restricted   Limmie Patricia, OTR/L,CBIS  [phone]   Esly Selvage, Charisse March 07/17/2017, 5:27 PM

## 2017-07-17 NOTE — Progress Notes (Signed)
PT Cancellation Note  Patient Details Name: Austin [name] MRN: [id number] DOB: 26-Feb-1941   Cancelled Treatment:    Reason Eval/Treat Not Completed: Medical issues which prohibited therapy. Chart reviewed, RN consulted. Hr in 130-150s upon arrival. RN reports just recently starting Cardizem drip. Will hold PT treatment at this time. Will continue to monitor remotely and attempt eval at later date/time.   10:49 AM, 07/17/17 Rosamaria LintsAllan C Everette Mall, PT, DPT Physical Therapist - [place] [phone](ASCOM)  [phone] (Office)    Austin [name] C 07/17/2017, 10:47 AM

## 2017-07-18 ENCOUNTER — Inpatient Hospital Stay (HOSPITAL_COMMUNITY): Payer: Medicare Other

## 2017-07-18 DIAGNOSIS — I482 Chronic atrial fibrillation: Secondary | ICD-10-CM

## 2017-07-18 DIAGNOSIS — I712 Thoracic aortic aneurysm, without rupture: Secondary | ICD-10-CM

## 2017-07-18 DIAGNOSIS — I10 Essential (primary) hypertension: Secondary | ICD-10-CM

## 2017-07-18 DIAGNOSIS — J9601 Acute respiratory failure with hypoxia: Secondary | ICD-10-CM

## 2017-07-18 DIAGNOSIS — J9600 Acute respiratory failure, unspecified whether with hypoxia or hypercapnia: Secondary | ICD-10-CM

## 2017-07-18 DIAGNOSIS — R6520 Severe sepsis without septic shock: Secondary | ICD-10-CM

## 2017-07-18 DIAGNOSIS — I5031 Acute diastolic (congestive) heart failure: Secondary | ICD-10-CM

## 2017-07-18 DIAGNOSIS — N390 Urinary tract infection, site not specified: Secondary | ICD-10-CM

## 2017-07-18 DIAGNOSIS — A419 Sepsis, unspecified organism: Principal | ICD-10-CM

## 2017-07-18 LAB — CBC
HCT: 45.2 % (ref 39.0–52.0)
Hemoglobin: 14.4 g/dL (ref 13.0–17.0)
MCH: 29.2 pg (ref 26.0–34.0)
MCHC: 31.9 g/dL (ref 30.0–36.0)
MCV: 91.7 fL (ref 78.0–100.0)
PLATELETS: 146 10*3/uL — AB (ref 150–400)
RBC: 4.93 MIL/uL (ref 4.22–5.81)
RDW: 17.4 % — AB (ref 11.5–15.5)
WBC: 9.4 10*3/uL (ref 4.0–10.5)

## 2017-07-18 LAB — BASIC METABOLIC PANEL
Anion gap: 9 (ref 5–15)
BUN: 36 mg/dL — AB (ref 6–20)
CALCIUM: 9.5 mg/dL (ref 8.9–10.3)
CHLORIDE: 102 mmol/L (ref 101–111)
CO2: 24 mmol/L (ref 22–32)
CREATININE: 1.31 mg/dL — AB (ref 0.61–1.24)
GFR calc non Af Amer: 52 mL/min — ABNORMAL LOW (ref >60)
GFR, EST AFRICAN AMERICAN: 60 mL/min — AB (ref >60)
GLUCOSE: 195 mg/dL — AB (ref 65–99)
Potassium: 4.4 mmol/L (ref 3.5–5.1)
Sodium: 135 mmol/L (ref 135–145)

## 2017-07-18 LAB — CULTURE, BLOOD (ROUTINE X 2)
Culture: NO GROWTH
Culture: NO GROWTH

## 2017-07-18 LAB — GLUCOSE, CAPILLARY
GLUCOSE-CAPILLARY: 181 mg/dL — AB (ref 65–99)
GLUCOSE-CAPILLARY: 324 mg/dL — AB (ref 65–99)
Glucose-Capillary: 182 mg/dL — ABNORMAL HIGH (ref 65–99)
Glucose-Capillary: 283 mg/dL — ABNORMAL HIGH (ref 65–99)

## 2017-07-18 LAB — BRAIN NATRIURETIC PEPTIDE: B Natriuretic Peptide: 995 pg/mL — ABNORMAL HIGH (ref 0.0–100.0)

## 2017-07-18 MED ORDER — IPRATROPIUM BROMIDE 0.02 % IN SOLN
0.5000 mg | Freq: Three times a day (TID) | RESPIRATORY_TRACT | Status: DC
  Administered 2017-07-18 (×3): 0.5 mg via RESPIRATORY_TRACT
  Filled 2017-07-18 (×3): qty 2.5

## 2017-07-18 MED ORDER — DILTIAZEM HCL 30 MG PO TABS
30.0000 mg | ORAL_TABLET | Freq: Four times a day (QID) | ORAL | Status: DC
  Administered 2017-07-18 – 2017-07-21 (×10): 30 mg via ORAL
  Filled 2017-07-18 (×12): qty 1

## 2017-07-18 MED ORDER — DEXTROSE 5 % IV SOLN
1.0000 g | INTRAVENOUS | Status: DC
  Administered 2017-07-18 – 2017-07-21 (×4): 1 g via INTRAVENOUS
  Filled 2017-07-18 (×6): qty 10

## 2017-07-18 MED ORDER — LEVALBUTEROL HCL 0.63 MG/3ML IN NEBU
0.6300 mg | INHALATION_SOLUTION | Freq: Three times a day (TID) | RESPIRATORY_TRACT | Status: DC
  Administered 2017-07-18 – 2017-07-21 (×11): 0.63 mg via RESPIRATORY_TRACT
  Filled 2017-07-18 (×11): qty 3

## 2017-07-18 MED ORDER — DIGOXIN 0.25 MG/ML IJ SOLN
0.1250 mg | Freq: Once | INTRAMUSCULAR | Status: AC
  Administered 2017-07-18: 0.125 mg via INTRAVENOUS
  Filled 2017-07-18: qty 2

## 2017-07-18 MED ORDER — INSULIN GLARGINE 100 UNIT/ML ~~LOC~~ SOLN
10.0000 [IU] | Freq: Two times a day (BID) | SUBCUTANEOUS | Status: DC
  Administered 2017-07-18 – 2017-07-21 (×5): 10 [IU] via SUBCUTANEOUS
  Filled 2017-07-18 (×10): qty 0.1

## 2017-07-18 NOTE — Evaluation (Signed)
Physical Therapy Evaluation Patient Details Name: Zadrian Mccauley MRN: [id number] DOB: 1941-01-27 Today's Date: 07/18/2017   History of Present Illness  Kimberly Coye is a 76 y.o. male with medical history significant of asthma/COPD, chronic atrial fibrillation, diastolic CHF, hypertension, hyperlipidemia type 2 diabetes, history of DVT and PE, PUD, GERD who is brought to the emergency department via EMS due to progressively worse dyspnea for the past few days. Was started on cardizem drip on 8/7 d/t uncontrolled HR with tachycardia. Pt ha sbeen weaned to 3LPM, which patient reports as baseline.   Clinical Impression  Pt admitted with above diagnosis. Pt currently with functional limitations due to the deficits listed below (see "PT Problem List"). Upon entry, the patient is received semirecumbent in bed, no family/caregiver present. The pt is awake and agreeable to participate. No acute distress noted at this time, pt only reporting some mild tightness in lower ABD. Pt received on and remaining on 3LPM throughout evaluation, with noted saturation of >93%, whereas pt is on 3LPM ad lib/PRN at baseline at home. Pt demonstrates heavy strength and balance impairment compared to baseline as he is able to perform limited community AMB without A/E at baseline. The patient is at high risk for falls as evidence by gait speed <0.69m/s, forward reach <5", and multiple LOB demonstrated throughout session. Tachy/AF better controlled but increase to 115-125 after 30sec AMB at bedside. Pt will benefit from skilled PT intervention to increase independence and safety with basic mobility in preparation for discharge to the venue listed below.       Follow Up Recommendations Home health PT;Supervision for mobility/OOB    Equipment Recommendations  None recommended by PT    Recommendations for Other Services       Precautions / Restrictions Precautions Precautions: Fall Restrictions Weight Bearing  Restrictions: No      Mobility  Bed Mobility Overal bed mobility: Needs Assistance Bed Mobility: Supine to Sit     Supine to sit: Supervision     General bed mobility comments: from semirecumbent, pt is never flat supine at home d/t dyspnea  Transfers Overall transfer level: Needs assistance Equipment used: 1 person hand held assist Transfers: Sit to/from Stand Sit to Stand: Min assist         General transfer comment: very weak, requires maximal effort, slow but steady   Ambulation/Gait Ambulation/Gait assistance: Min guard Ambulation Distance (Feet): 10 Feet Assistive device: 1 person hand held assist       General Gait Details: alternates direction upon cue s LOB, denies SOB, HR increased to 115-125 BPM in AF, SpO2 on 3LPM @ 96%   Stairs            Wheelchair Mobility    Modified Rankin (Stroke Patients Only)       Balance Overall balance assessment: Needs assistance;History of Falls;Modified Independent Sitting-balance support: Feet supported;No upper extremity supported;Single extremity supported Sitting balance-Leahy Scale: Fair     Standing balance support: During functional activity;Bilateral upper extremity supported Standing balance-Leahy Scale: Fair               High level balance activites: Backward walking;Direction changes               Pertinent Vitals/Pain Pain Assessment: No/denies pain (c/o tightness in lower ABd, but no true pain )    Home Living Family/patient expects to be discharged to:: Private residence Living Arrangements: Spouse/significant other Available Help at Discharge: Family Type of Home: House Home Access: Stairs to enter  Entrance Stairs-Rails: None Entrance Stairs-Number of Steps: 2 Home Layout: One level Home Equipment: Walker - 2 wheels Additional Comments: Pt states that he didn't use the 2 wheeled walker.     Prior Function Level of Independence: Independent         Comments: Patient  reports that he is independent with ADLs at home. Grandson completes all yardwork. Pt continues to drive.      Hand Dominance   Dominant Hand: Right    Extremity/Trunk Assessment        Lower Extremity Assessment Lower Extremity Assessment: Generalized weakness;Overall WFL for tasks assessed       Communication   Communication: No difficulties  Cognition Arousal/Alertness: Lethargic Behavior During Therapy: WFL for tasks assessed/performed Overall Cognitive Status: Within Functional Limits for tasks assessed                                        General Comments      Exercises     Assessment/Plan    PT Assessment Patient needs continued PT services  PT Problem List Decreased strength;Decreased activity tolerance;Decreased balance;Decreased mobility;Pain       PT Treatment Interventions Gait training;DME instruction;Functional mobility training;Therapeutic activities;Therapeutic exercise;Stair training;Balance training;Cognitive remediation;Patient/family education    PT Goals (Current goals can be found in the Care Plan section)  Acute Rehab PT Goals Patient Stated Goal: regain strength  PT Goal Formulation: With patient Time For Goal Achievement: 08/01/17 Potential to Achieve Goals: Good    Frequency Min 2X/week   Barriers to discharge        Co-evaluation               AM-PAC PT "6 Clicks" Daily Activity  Outcome Measure Difficulty turning over in bed (including adjusting bedclothes, sheets and blankets)?: A Lot Difficulty moving from lying on back to sitting on the side of the bed? : A Lot Difficulty sitting down on and standing up from a chair with arms (e.g., wheelchair, bedside commode, etc,.)?: A Lot Help needed moving to and from a bed to chair (including a wheelchair)?: Total Help needed walking in hospital room?: Total Help needed climbing 3-5 steps with a railing? : Total 6 Click Score: 9    End of Session Equipment  Utilized During Treatment: Oxygen Activity Tolerance: Patient tolerated treatment well;Patient limited by fatigue Patient left: in chair;with nursing/sitter in room;with call bell/phone within reach Nurse Communication: Mobility status (vitals) PT Visit Diagnosis: Unsteadiness on feet (R26.81);Difficulty in walking, not elsewhere classified (R26.2);Muscle weakness (generalized) (M62.81)    Time: [id number] PT Time Calculation (min) (ACUTE ONLY): 24 min   Charges:   PT Evaluation $PT Eval Moderate Complexity: 1 Mod PT Treatments $Therapeutic Activity: 8-22 mins   PT G Codes:        10:25 AM, 07/18/17 Rosamaria LintsAllan C Yaira Bernardi, PT, DPT Physical Therapist - [place] [phone](ASCOM)  [phone] (Office)     Elazar Argabright C 07/18/2017, 10:22 AM

## 2017-07-18 NOTE — Progress Notes (Signed)
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 This morning he says he feels better. He has been coughing and is still coughing up sputum. He remains on IV antibiotics. He says his breathing is better. He remains on oxygen and this has been able to be weaned but not discontinued    Objective: Vital signs in last 24 hours: Temp:  [97.7 F (36.5 C)-98.6 F (37 C)] 97.7 F (36.5 C) (08/08 0400) Pulse Rate:  [56-127] 116 (08/07 2115) Resp:  [17-26] 26 (08/07 2115) BP: (87-123)/(60-93) 123/81 (08/07 2115) SpO2:  [77 %-96 %] 93 % (08/07 2115) Weight:  [94.9 kg (209 lb 3.5 oz)] 94.9 kg (209 lb 3.5 oz) (08/08 0500) Weight change: -2.5 kg (-5 lb 8.2 oz) Last BM Date: 07/17/17  Intake/Output from previous day: 08/07 0701 - 08/08 0700 In: 99.1 [I.V.:99.1] Out: 3401 [Urine:3400; Stool:1]  PHYSICAL EXAM General appearance: alert, cooperative and no distress Resp: rales bilaterally and rhonchi bilaterally Cardio: irregularly irregular rhythm GI: soft, non-tender; bowel sounds normal; no masses,  no organomegaly Extremities: He has some venous stasis has a wound on his right leg and seems to have some dependent edema in the back part of his legs which is better Skin turgor fair  Lab Results:  Results for orders placed or performed during the hospital encounter of 07/13/17 (from the past 48 hour(s))  Glucose, capillary     Status: Abnormal   Collection Time: 07/16/17  7:53 AM  Result Value Ref Range   Glucose-Capillary 215 (H) 65 - 99 mg/dL  Glucose, capillary     Status: Abnormal   Collection Time: 07/16/17 11:41 AM  Result Value Ref Range   Glucose-Capillary 272 (H) 65 - 99 mg/dL  Glucose, capillary     Status: Abnormal   Collection Time: 07/16/17  4:41 PM  Result Value Ref Range   Glucose-Capillary 152 (H) 65 - 99 mg/dL  Glucose,  capillary     Status: Abnormal   Collection Time: 07/16/17  9:17 PM  Result Value Ref Range   Glucose-Capillary 170 (H) 65 - 99 mg/dL  CBC with Differential/Platelet     Status: Abnormal   Collection Time: 07/17/17  5:47 AM  Result Value Ref Range   WBC 10.9 (H) 4.0 - 10.5 K/uL   RBC 4.65 4.22 - 5.81 MIL/uL   Hemoglobin 13.5 13.0 - 17.0 g/dL   HCT 43.0 39.0 - 52.0 %   MCV 92.5 78.0 - 100.0 fL   MCH 29.0 26.0 - 34.0 pg   MCHC 31.4 30.0 - 36.0 g/dL   RDW 17.5 (H) 11.5 - 15.5 %   Platelets 146 (L) 150 - 400 K/uL   Neutrophils Relative % 88 %   Neutro Abs 9.6 (H) 1.7 - 7.7 K/uL   Lymphocytes Relative 6 %   Lymphs Abs 0.6 (L) 0.7 - 4.0 K/uL   Monocytes Relative 6 %   Monocytes Absolute 0.7 0.1 - 1.0 K/uL   Eosinophils Relative 0 %   Eosinophils Absolute 0.0 0.0 - 0.7 K/uL   Basophils Relative 0 %   Basophils Absolute 0.0 0.0 - 0.1 K/uL  Basic metabolic panel     Status: Abnormal   Collection Time: 07/17/17  5:47 AM  Result Value Ref Range   Sodium 136 135 - 145 mmol/L   Potassium 4.4 3.5 - 5.1 mmol/L   Chloride 103 101 - 111 mmol/L  CO2 26 22 - 32 mmol/L   Glucose, Bld 228 (H) 65 - 99 mg/dL   BUN 37 (H) 6 - 20 mg/dL   Creatinine, Ser 1.55 (H) 0.61 - 1.24 mg/dL   Calcium 9.4 8.9 - 10.3 mg/dL   GFR calc non Af Amer 42 (L) >60 mL/min   GFR calc Af Amer 49 (L) >60 mL/min    Comment: (NOTE) The eGFR has been calculated using the CKD EPI equation. This calculation has not been validated in all clinical situations. eGFR's persistently <60 mL/min signify possible Chronic Kidney Disease.    Anion gap 7 5 - 15  Glucose, capillary     Status: Abnormal   Collection Time: 07/17/17  8:09 AM  Result Value Ref Range   Glucose-Capillary 187 (H) 65 - 99 mg/dL  Glucose, capillary     Status: Abnormal   Collection Time: 07/17/17 12:17 PM  Result Value Ref Range   Glucose-Capillary 274 (H) 65 - 99 mg/dL  Glucose, capillary     Status: Abnormal   Collection Time: 07/17/17  4:16 PM   Result Value Ref Range   Glucose-Capillary 190 (H) 65 - 99 mg/dL  Glucose, capillary     Status: Abnormal   Collection Time: 07/17/17  9:24 PM  Result Value Ref Range   Glucose-Capillary 227 (H) 65 - 99 mg/dL  Basic metabolic panel     Status: Abnormal   Collection Time: 07/18/17  4:51 AM  Result Value Ref Range   Sodium 135 135 - 145 mmol/L   Potassium 4.4 3.5 - 5.1 mmol/L   Chloride 102 101 - 111 mmol/L   CO2 24 22 - 32 mmol/L   Glucose, Bld 195 (H) 65 - 99 mg/dL   BUN 36 (H) 6 - 20 mg/dL   Creatinine, Ser 1.31 (H) 0.61 - 1.24 mg/dL   Calcium 9.5 8.9 - 10.3 mg/dL   GFR calc non Af Amer 52 (L) >60 mL/min   GFR calc Af Amer 60 (L) >60 mL/min    Comment: (NOTE) The eGFR has been calculated using the CKD EPI equation. This calculation has not been validated in all clinical situations. eGFR's persistently <60 mL/min signify possible Chronic Kidney Disease.    Anion gap 9 5 - 15  CBC     Status: Abnormal   Collection Time: 07/18/17  4:51 AM  Result Value Ref Range   WBC 9.4 4.0 - 10.5 K/uL   RBC 4.93 4.22 - 5.81 MIL/uL   Hemoglobin 14.4 13.0 - 17.0 g/dL   HCT 45.2 39.0 - 52.0 %   MCV 91.7 78.0 - 100.0 fL   MCH 29.2 26.0 - 34.0 pg   MCHC 31.9 30.0 - 36.0 g/dL   RDW 17.4 (H) 11.5 - 15.5 %   Platelets 146 (L) 150 - 400 K/uL    ABGS  Recent Labs  07/15/17 1000  PHART 7.388  PO2ART 82.3*  HCO3 22.1   CULTURES Recent Results (from the past 240 hour(s))  Blood Culture (routine x 2)     Status: None (Preliminary result)   Collection Time: 07/13/17  9:00 PM  Result Value Ref Range Status   Specimen Description BLOOD RIGHT FOREARM  Final   Special Requests   Final    Blood Culture results may not be optimal due to an inadequate volume of blood received in culture bottles   Culture NO GROWTH 4 DAYS  Final   Report Status PENDING  Incomplete  Blood Culture (routine x 2)  Status: None (Preliminary result)   Collection Time: 07/13/17  9:00 PM  Result Value Ref Range  Status   Specimen Description BLOOD LEFT FOREARM  Final   Special Requests   Final    Blood Culture results may not be optimal due to an inadequate volume of blood received in culture bottles   Culture NO GROWTH 4 DAYS  Final   Report Status PENDING  Incomplete  MRSA PCR Screening     Status: None   Collection Time: 07/14/17 12:46 AM  Result Value Ref Range Status   MRSA by PCR NEGATIVE NEGATIVE Final    Comment:        The GeneXpert MRSA Assay (FDA approved for NASAL specimens only), is one component of a comprehensive MRSA colonization surveillance program. It is not intended to diagnose MRSA infection nor to guide or monitor treatment for MRSA infections.   Culture, Urine     Status: None   Collection Time: 07/15/17  2:15 PM  Result Value Ref Range Status   Specimen Description URINE, CLEAN CATCH  Final   Special Requests NONE  Final   Culture   Final    NO GROWTH Performed at [facility], [address] [place], [place] [address]    Report Status 07/17/2017 FINAL  Final   Studies/Results: Dg Chest Port 1 View  Result Date: 07/17/2017 CLINICAL DATA:  Difficulty breathing and productive cough for 1 week. Multiple medical problems. EXAM: PORTABLE CHEST 1 VIEW COMPARISON:  07/15/2017 FINDINGS: The heart remains moderately enlarged. Vascular congestion is stable. Bibasilar opacity, likely a combination of pleural fluid and volume loss is stable on the left and increased on the right. No pneumothorax. IMPRESSION: Stable opacity at the left base and increasing opacity at the right base. This findings likely are a combination of pleural fluid and volume loss. Stable vascular congestion. Electronically Signed   By: Marybelle Killings M.D.   On: 07/17/2017 07:55    Medications:  Prior to Admission:  Prescriptions Prior to Admission  Medication Sig Dispense Refill Last Dose  . albuterol (PROAIR HFA) 108 (90 Base) MCG/ACT inhaler Inhale 1-2 puffs into the lungs every 6 (six) hours  as needed for wheezing or shortness of breath.   Past Week at Unknown time  . albuterol (PROVENTIL) (2.5 MG/3ML) 0.083% nebulizer solution Take 3 mLs (2.5 mg total) by nebulization every 6 (six) hours as needed for wheezing or shortness of breath. 75 mL 12 Past Week at Unknown time  . aspirin EC 81 MG tablet Take 81 mg by mouth daily.   07/13/2017 at Unknown time  . budesonide-formoterol (SYMBICORT) 160-4.5 MCG/ACT inhaler Inhale 2 puffs into the lungs 2 (two) times daily as needed (for shortness of breath).    Past Week at Unknown time  . furosemide (LASIX) 40 MG tablet Take 1 tablet (40 mg total) by mouth 2 (two) times daily. 60 tablet 5 07/13/2017 at Unknown time  . glipiZIDE (GLUCOTROL) 5 MG tablet Take 5 mg by mouth 2 (two) times daily.   07/13/2017 at Unknown time  . lisinopril (PRINIVIL,ZESTRIL) 40 MG tablet Take 40 mg by mouth daily.   07/13/2017 at Unknown time  . lovastatin (MEVACOR) 40 MG tablet Take 40 mg by mouth at bedtime. Pt should take with food.    07/12/2017 at Unknown time  . metFORMIN (GLUCOPHAGE) 500 MG tablet Take 500 mg by mouth 2 (two) times daily with a meal.   07/13/2017 at Unknown time  . metoprolol tartrate (LOPRESSOR)  25 MG tablet Take 12.5 mg by mouth 2 (two) times daily.    07/13/2017 at 730a  . omeprazole (PRILOSEC) 40 MG capsule Take 40 mg by mouth daily.   07/13/2017 at Unknown time  . polyethylene glycol (MIRALAX) packet Take 17 g by mouth daily as needed for moderate constipation. 30 each 0 unknown  . nitroGLYCERIN (NITROSTAT) 0.4 MG SL tablet Place 0.4 mg under the tongue every 5 (five) minutes x 3 doses as needed for chest pain. If no relief call 911 or go to emergency room.   unknown   Scheduled: . aspirin EC  81 mg Oral Daily  . budesonide (PULMICORT) nebulizer solution  0.5 mg Nebulization BID  . fluticasone  2 spray Each Nare Daily  . furosemide  40 mg Intravenous Q12H  . guaiFENesin  1,200 mg Oral BID  . heparin subcutaneous  5,000 Units Subcutaneous Q8H  . insulin  aspart  0-15 Units Subcutaneous TID WC  . insulin aspart  0-5 Units Subcutaneous QHS  . insulin glargine  10 Units Subcutaneous BH-q7a  . ipratropium  0.5 mg Nebulization TID  . levalbuterol  0.63 mg Nebulization TID  . loratadine  10 mg Oral Daily  . methylPREDNISolone (SOLU-MEDROL) injection  60 mg Intravenous Daily  . metoprolol tartrate  12.5 mg Oral BID  . pantoprazole  40 mg Oral Daily  . pravastatin  40 mg Oral q1800  . sildenafil  20 mg Oral TID   Continuous: . diltiazem (CARDIZEM) infusion 12.5 mg/hr (07/18/17 0103)  . piperacillin-tazobactam (ZOSYN)  IV 3.375 g (07/18/17 0717)  . sodium chloride     ZXY:[id number], levalbuterol, LORazepam, nitroGLYCERIN, polyethylene glycol  Assesment:He was admitted with acute hypoxic respiratory failure and sepsis. He has a UTI and may have an infiltrate on chest x-ray but he has a number of other issues on his chest x-ray. He has had pulmonary emboli but does not appear to have that now. He has had spiculated masses but they have not changed. He has pulmonary hypertension I suspect related to chronic hypoxia and to his history of pulmonary emboli. He has acute on chronic diastolic heart failure with some pulmonary edema and he is on an increased dose of Lasix and was substantially negative yesterday  Principal Problem:   Acute respiratory failure with hypoxia (HCC) Active Problems:   Type 2 diabetes mellitus (HCC)   HLD (hyperlipidemia)   GERD (gastroesophageal reflux disease)   Essential hypertension   Chronic pulmonary embolism (HCC)   COPD exacerbation (HCC)   Sepsis due to undetermined organism with acute respiratory failure (HCC)   Ascending aortic aneurysm (HCC)   Elevated bilirubin   Left nephrolithiasis   Lactic acidosis   Moderate to severe pulmonary hypertension (HCC)--per 2-D echo 07/14/2017   Mass of lung   Acute lower UTI   Sepsis ([place])   Chronic atrial fibrillation ([place])    Plan:Continue current treatments. He is  improving but slowly    LOS: 5 days   Ava Deguire L 07/18/2017, 7:23 AM

## 2017-07-18 NOTE — Progress Notes (Signed)
PROGRESS NOTE    Austin [name]  TKZ:[id number] DOB: [birth year] DOA: 07/13/2017 PCP: [facility]    Brief Narrative:  Patient is a40 year old man with history of COPD, diastolic heart failure and chronic atrial fibrillation who presented on 07/13/17 with a several day history of progressivel and worsening shortness of breath. On the day of admission, patient had been outside to feed his dog, but slipped in the mud and was unable to get up by himself or with his wife's assistance. EMS was called and patient was reportedly hypoxic with decreased level of consciousness.  In the ED, initial vital signs were rectal temperature 97.70F, HR 86, BP 134/82 mmHg, respirations 28 and O2 sat 96% on BPAP. He was started on BiPAP ventilation, given 3000 mL of normal saline bolus, Solu-Medrol 125 mg IVP 1 dose, given bronchodilators, vancomycin and Zosyn. Labs revealed a WBC of 21.2 with 89% neutrophils, hemoglobin of 15.4 g/dL  and platelets 159. Sodium was 137, potassium 4.8, chloride 103, bicarbonate 16 and initial lactic acid 7.5 mmol/L. BUN was 33, creatinine 1.9, glucose 130 and bilirubin 2.1 mg/dL. His AST was mildly elevated at 46. BNP was 1769 pg/mL. EKG was sinus rhythm with without ischemic changes, but significant for RBBB, which has been present before. He was admitted for further evaluation and management.   Assessment & Plan:   Principal Problem:   Acute respiratory failure with hypoxia (HCC) Active Problems:   Type 2 diabetes mellitus (HCC)   HLD (hyperlipidemia)   GERD (gastroesophageal reflux disease)   Essential hypertension   Chronic pulmonary embolism (HCC)   COPD exacerbation (HCC)   Sepsis due to undetermined organism with acute respiratory failure (HCC)   Ascending aortic aneurysm (HCC)   Elevated bilirubin   Left nephrolithiasis   Lactic acidosis   Moderate to severe pulmonary hypertension (HCC)--per 2-D echo 07/14/2017   Mass of lung   Acute  lower UTI   Sepsis ([place])   Chronic atrial fibrillation (HCC)  #1 Acute respiratory failure with hypoxia secondary to acute diastolic heart failure. Patient presented with acute respiratory failure with hypoxia initially requiring BiPAP. Chest x-ray on 8/5 revealed pleural effusions mildly increased from 2 days prior, along with emphysema and likely pulmonary hypertension. -He was initially started on IV fluids. Due to liable blood pressures, diuretics were initially held. -Subsequently, IV Lasix was started which was titrated up to 40 mg IV every 12 hours. -Due to patient's prior history of PE and DVT, VQ scan ordered for evaluation and revealed no acute PE. Lower extremity Dopplers were negative for DVT. -2-D echocardiogram revealed grade 1 diastolic dysfunction, an EF of 65-70%, dynamic obstruction at rest and mid cavity with severely dilated right ventricular cavity size, systolic function moderately to severely reduced on the right which is worse than previous 2-D echo and worsening pulmonary artery systolic pressure with a PA peak pressure of 81 mmHg. - ABG had a pH of 7.38, PCO2 of 36, PO2 of 82, bicarbonate of 22.  -I/O's recorded as +1.2L -We will order another BNP and chest x-ray for further evaluation. Continue Lasix for now.  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 VQ scan ordered for evaluation of hypoxia and was found to be negative for PE. 2-D echo revealed severe pulmonary hypertension with 81 mmHg and severely reduced right heart systolic function. -Pulmonologist, Dr. Luan Pulling was consulted. He started the patient on sildenafil. -Patient will need to follow-up with the pulmonary hypertension clinic. -Currently stable.  Sepsis due to undetermined organism secondary to/UTI On admission patient met criteria for sepsis with a lactic acidosis of 7.5  Patient also noted to have a leukocytosis with a white count of 21.2. -Patient was started  on IV fluids and broad-spectrum antibiotics vancomycin and Zosyn. -Blood cultures and a urine culture ordered and both are negative to date. -Lactic acid trended down. -White blood cell count has normalized. -MRSA PCR was negative, so vancomycin was discontinued. -Given negative cultures, will narrow antibiotic to Rocephin.  COPD exacerbation Patient on home O2 as needed. -Due to chest tightness and congestion noted, Solu-Medrol  60 mg IV every day, Pulmicort, Claritin, Flonase, Mucinex. Ruthe Mannan was discontinued. -Duo nebs were discontinued due to A. fib with RVR. He was placed on Xopenex and Atrovent nebulizers. -Solu-Medrol decreased to daily. We'll continue to titrate down accordingly. -Clinically improving.  Prior history of PE and DVT Patient is not on anticoagulation prior to admission. Patient stated that he did not follow-up with one of his doctors and thus the reason why he is not on anticoagulation at this time. Due to patient's respiratory status and presentation and concern for DVT and PE  VQ scan was checked which was negative for pulmonary emboli. Lower extremity Dopplers were also negative for DVT. IV heparin has been discontinued. -It had been subsequently noted that patient had a prior history of GI bleed while on anticoagulation per family.   Chronic atrial fibrillation with RVR CHA2DS2Vasc score 6 Patient is treated chronically with metoprolol for rate control. Apparently, anticoagulation was discontinued due to a previous significant GI bleed. -Patient developed RVR with a heart rate in the 140s. -Cardizem drip was started. Metoprolol was continued. -Full start oral diltiazem and try to titrate off diltiazem drip as his heart rate has improved.   Diabetes mellitus type 2 Patient is treated chronically with glipizide and metformin. They were withheld. -Patient subsequently started on sliding scale NovoLog and Lantus due to IV steroid. -We'll titrate up Lantus to twice a  day. -Hemoglobin A1c was 7.9.  Hypertension. Patient is treated chronically with metoprolol. It was continued. -Diltiazem added due to RVR. -Blood pressure stable. We'll continue to monitor.  Chronic kidney disease stage II-III Patient's creatinine was 1.651/2018 and 1.2 911/2016. It was 1.9 on admission. -He was started on IV fluids. -IV fluids were discontinued due to acute diastolic heart failure/right heart failure exacerbation. -IV Lasix started.  -His creatinine has improved to 1.31, better than baseline.  Incidental aortic ascending aneurysm Noted on CT. Outpatient follow-up.  Stable spiculated masses right middle lobe and right upper lobe Per CT scan chest without contrast 07/13/2017 which stated that given the long-term stability, benign process likely favored. Patient will need ongoing outpatient follow-up with pulmonary.  Thrombocytopenia. Patient's platelet count was within normal limits, but has decreased to 146. -We'll discontinue subcutaneous heparin and start Lovenox instead.      DVT prophylaxis: Heparin  Code Status:Full  Family Communication:Family not available  Disposition Plan:Discharge when clinically appropriate   Consultants:   Pulmonary: Dr. Luan Pulling 07/16/2017  Procedures:   Chest x-ray 07/13/2017  Plain films of the right tib-fib 07/13/2017  CT abdomen and pelvis 07/13/2017  CT chest without contrast 07/13/2017  2-D echo 07/14/2017--- EF 65-70%, dynamic obstruction at rest and mid cavity, grade 1 diastolic dysfunction, severely dilated right ventricular cavity size, systolic function moderately to severely reduced, right atrium severely dilated, pulmonary artery systolic pressure severely increased PA peak pressure of 81 mmHg.  VQ scan 07/15/2017  Lower  Extremity Dopplers 07/15/2017  Antimicrobials:   Rocephin 07/18/17 >>  IV Zosyn 07/13/2017>>>> 07/18/17   IV vancomycin 07/13/2017>>>>> 07/16/2017   Subjective: Patient denies  chest pain or shortness of breath.  Objective: Vitals:   07/18/17 0600 07/18/17 0700 07/18/17 0800 07/18/17 0804  BP: 105/77 122/85    Pulse: 76 83 93   Resp: 18 18 (!) 25   Temp:      TempSrc:      SpO2: (!) 81% (!) 81% 96% 96%  Weight:      Height:      O2 saturations in the 90s (above SPO2 is inaccurate).   Intake/Output Summary (Last 24 hours) at 07/18/17 0857 Last data filed at 07/18/17 0804  Gross per 24 hour  Intake           321.37 ml  Output             3576 ml  Net         -3254.63 ml   Filed Weights   07/16/17 0500 07/17/17 0500 07/18/17 0500  Weight: 97.4 kg (214 lb 11.7 oz) 97.4 kg (214 lb 11.7 oz) 94.9 kg (209 lb 3.5 oz)    Examination:  General exam: No acute distress.  Respiratory system: Diffuse scattered crackles throughout. Breathing nonlabored.  Cardiovascular system: Irregularly irregular; no  murmurs, rubs, gallops or clicks.1-2+ right lower extremity edema. 1-2+ left lower extremity edema. Gastrointestinal system: Abdomen is nondistended, soft and nontender. No organomegaly or masses felt. Normal bowel sounds heard. Central nervous system: Alert and oriented. No focal neurological deficits. Extremities: No acute hot red joints.  Skin: Right lower extremity with some erythema, 1+ edema, small ulceration noted.  Psychiatry: Judgement and insight appear normal. Mood & affect appropriate.     Data Reviewed: I have personally reviewed following labs and imaging studies  CBC:  Recent Labs Lab 07/13/17 1901 07/14/17 0443 07/15/17 0446 07/16/17 0343 07/17/17 0547 07/18/17 0451  WBC 21.2* 18.5* 14.2* 9.4 10.9* 9.4  NEUTROABS 18.8* 17.2* 12.2*  --  9.6*  --   HGB 15.4 14.4 13.0 13.4 13.5 14.4  HCT 49.5 45.5 40.1 42.1 43.0 45.2  MCV 95.2 92.7 91.6 92.1 92.5 91.7  PLT 159 161 154 150 146* 034*   Basic Metabolic Panel:  Recent Labs Lab 07/14/17 0443 07/15/17 0446 07/16/17 0343 07/17/17 0547 07/18/17 0451  NA 138 135 135 136 135  K 4.6 4.0  4.3 4.4 4.4  CL 103 101 105 103 102  CO2 24 24 23 26 24   GLUCOSE 160* 199* 298* 228* 195*  BUN 39* 44* 40* 37* 36*  CREATININE 1.85* 1.72* 1.48* 1.55* 1.31*  CALCIUM 9.6 8.8* 8.9 9.4 9.5  MG  --   --  1.8  --   --    GFR: Estimated Creatinine Clearance: 58.2 mL/min (A) (by C-G formula based on SCr of 1.31 mg/dL (H)). Liver Function Tests:  Recent Labs Lab 07/13/17 1901 07/14/17 0443  AST 46* 37  ALT 15* 12*  ALKPHOS 76 71  BILITOT 2.1* 1.9*  PROT 6.8 6.0*  ALBUMIN 3.6 3.3*   No results for input(s): LIPASE, AMYLASE in the last 168 hours. No results for input(s): AMMONIA in the last 168 hours. Coagulation Profile: No results for input(s): INR, PROTIME in the last 168 hours. Cardiac Enzymes: No results for input(s): CKTOTAL, CKMB, CKMBINDEX, TROPONINI in the last 168 hours. BNP (last 3 results) No results for input(s): PROBNP in the last 8760 hours. HbA1C:  Recent Labs  07/16/17  0343  HGBA1C 7.9*   CBG:  Recent Labs Lab 07/17/17 0809 07/17/17 1217 07/17/17 1616 07/17/17 2124 07/18/17 0738  GLUCAP 187* 274* 190* 227* 181*   Lipid Profile:  Recent Labs  07/16/17 0343  CHOL 103  HDL 42  LDLCALC 50  TRIG 54  CHOLHDL 2.5   Thyroid Function Tests: No results for input(s): TSH, T4TOTAL, FREET4, T3FREE, THYROIDAB in the last 72 hours. Anemia Panel: No results for input(s): VITAMINB12, FOLATE, FERRITIN, TIBC, IRON, RETICCTPCT in the last 72 hours. Sepsis Labs:  Recent Labs Lab 07/14/17 1509 07/14/17 2052 07/15/17 0446 07/16/17 0343  LATICACIDVEN 3.3* 2.6* 2.7* 1.5    Recent Results (from the past 240 hour(s))  Blood Culture (routine x 2)     Status: None   Collection Time: 07/13/17  9:00 PM  Result Value Ref Range Status   Specimen Description BLOOD RIGHT FOREARM  Final   Special Requests   Final    Blood Culture results may not be optimal due to an inadequate volume of blood received in culture bottles   Culture NO GROWTH 5 DAYS  Final    Report Status 07/18/2017 FINAL  Final  Blood Culture (routine x 2)     Status: None   Collection Time: 07/13/17  9:00 PM  Result Value Ref Range Status   Specimen Description BLOOD LEFT FOREARM  Final   Special Requests   Final    Blood Culture results may not be optimal due to an inadequate volume of blood received in culture bottles   Culture NO GROWTH 5 DAYS  Final   Report Status 07/18/2017 FINAL  Final  MRSA PCR Screening     Status: None   Collection Time: 07/14/17 12:46 AM  Result Value Ref Range Status   MRSA by PCR NEGATIVE NEGATIVE Final    Comment:        The GeneXpert MRSA Assay (FDA approved for NASAL specimens only), is one component of a comprehensive MRSA colonization surveillance program. It is not intended to diagnose MRSA infection nor to guide or monitor treatment for MRSA infections.   Culture, Urine     Status: None   Collection Time: 07/15/17  2:15 PM  Result Value Ref Range Status   Specimen Description URINE, CLEAN CATCH  Final   Special Requests NONE  Final   Culture   Final    NO GROWTH Performed at [facility], [address], [place], [place] [address]    Report Status 07/17/2017 FINAL  Final         Radiology Studies: Dg Chest Port 1 View  Result Date: 07/17/2017 CLINICAL DATA:  Difficulty breathing and productive cough for 1 week. Multiple medical problems. EXAM: PORTABLE CHEST 1 VIEW COMPARISON:  07/15/2017 FINDINGS: The heart remains moderately enlarged. Vascular congestion is stable. Bibasilar opacity, likely a combination of pleural fluid and volume loss is stable on the left and increased on the right. No pneumothorax. IMPRESSION: Stable opacity at the left base and increasing opacity at the right base. This findings likely are a combination of pleural fluid and volume loss. Stable vascular congestion. Electronically Signed   By: Marybelle Killings M.D.   On: 07/17/2017 07:55        Scheduled Meds: . aspirin EC  81 mg Oral Daily    . budesonide (PULMICORT) nebulizer solution  0.5 mg Nebulization BID  . fluticasone  2 spray Each Nare Daily  . furosemide  40 mg Intravenous Q12H  . guaiFENesin  1,200 mg Oral BID  . heparin subcutaneous  5,000 Units Subcutaneous Q8H  . insulin aspart  0-15 Units Subcutaneous TID WC  . insulin aspart  0-5 Units Subcutaneous QHS  . insulin glargine  10 Units Subcutaneous BH-q7a  . ipratropium  0.5 mg Nebulization TID  . levalbuterol  0.63 mg Nebulization TID  . loratadine  10 mg Oral Daily  . methylPREDNISolone (SOLU-MEDROL) injection  60 mg Intravenous Daily  . metoprolol tartrate  12.5 mg Oral BID  . pantoprazole  40 mg Oral Daily  . pravastatin  40 mg Oral q1800  . sildenafil  20 mg Oral TID   Continuous Infusions: . diltiazem (CARDIZEM) infusion 12.5 mg/hr (07/18/17 0103)  . piperacillin-tazobactam (ZOSYN)  IV 3.375 g (07/18/17 0717)  . sodium chloride       LOS: 5 days    Time spent: 40 mins    Rexene Alberts, MD [facility] Pager [phone]  If 7PM-7AM, please contact night-coverage [email or web address] Password [facility] 07/18/2017, 8:57 AM

## 2017-07-19 DIAGNOSIS — I272 Pulmonary hypertension, unspecified: Secondary | ICD-10-CM

## 2017-07-19 LAB — GLUCOSE, CAPILLARY
Glucose-Capillary: 246 mg/dL — ABNORMAL HIGH (ref 65–99)
Glucose-Capillary: 351 mg/dL — ABNORMAL HIGH (ref 65–99)
Glucose-Capillary: 232 mg/dL — ABNORMAL HIGH (ref 65–99)
Glucose-Capillary: 265 mg/dL — ABNORMAL HIGH (ref 65–99)

## 2017-07-19 LAB — BASIC METABOLIC PANEL
Anion gap: 6 (ref 5–15)
BUN: 38 mg/dL — AB (ref 6–20)
CALCIUM: 9.5 mg/dL (ref 8.9–10.3)
CO2: 26 mmol/L (ref 22–32)
CREATININE: 1.44 mg/dL — AB (ref 0.61–1.24)
Chloride: 101 mmol/L (ref 101–111)
GFR calc non Af Amer: 46 mL/min — ABNORMAL LOW (ref >60)
GFR, EST AFRICAN AMERICAN: 53 mL/min — AB (ref >60)
Glucose, Bld: 272 mg/dL — ABNORMAL HIGH (ref 65–99)
Potassium: 4.7 mmol/L (ref 3.5–5.1)
SODIUM: 133 mmol/L — AB (ref 135–145)

## 2017-07-19 LAB — CBC
HCT: 47 % (ref 39.0–52.0)
Hemoglobin: 15.2 g/dL (ref 13.0–17.0)
MCH: 29.5 pg (ref 26.0–34.0)
MCHC: 32.3 g/dL (ref 30.0–36.0)
MCV: 91.3 fL (ref 78.0–100.0)
PLATELETS: 167 10*3/uL (ref 150–400)
RBC: 5.15 MIL/uL (ref 4.22–5.81)
RDW: 17.1 % — AB (ref 11.5–15.5)
WBC: 10.6 10*3/uL — ABNORMAL HIGH (ref 4.0–10.5)

## 2017-07-19 MED ORDER — DIGOXIN 125 MCG PO TABS
0.1250 mg | ORAL_TABLET | Freq: Every day | ORAL | Status: DC
  Administered 2017-07-19: 0.125 mg via ORAL
  Filled 2017-07-19: qty 1

## 2017-07-19 MED ORDER — DILTIAZEM HCL 100 MG IV SOLR: 5.0000 mg/h | INTRAVENOUS | Status: DC

## 2017-07-19 MED ORDER — PREDNISONE 20 MG PO TABS
40.0000 mg | ORAL_TABLET | Freq: Every day | ORAL | Status: DC
  Administered 2017-07-19 – 2017-07-21 (×3): 40 mg via ORAL
  Filled 2017-07-19 (×3): qty 2

## 2017-07-19 MED ORDER — DIGOXIN 0.25 MG/ML IJ SOLN
0.1250 mg | Freq: Once | INTRAMUSCULAR | Status: AC
  Administered 2017-07-19: 0.125 mg via INTRAVENOUS
  Filled 2017-07-19: qty 2

## 2017-07-19 MED ORDER — FUROSEMIDE 10 MG/ML IJ SOLN: 40.0000 mg | Freq: Every day | INTRAMUSCULAR | Status: DC

## 2017-07-19 NOTE — Care Management Note (Addendum)
Case Management Note  Patient Details  Name: Austin [name] MRN: [id number] Date of Birth: 1941/11/22  Subjective/Objective: Adm with acute respiratory failure with hypoxia. From home with wife and 2 grandchildren.Ind PTA. Chart reveals [facility]. Patient reports that he has port tanks for community use as well. [facility]. [facility] Offered choice of home health agencies.              Action/Plan: Trilby DrummerLinda Lotiann [facility] notified and will obtain orders from chart.   ADDENDUM: [facility] can accept referral for PT. Tim of [facility] notified and can accept referral. Potential DC 07/21/2017.   Expected Discharge Date:     07/19/2017             Expected Discharge Plan:  Home w Home Health Services  In-House Referral:     Discharge planning Services  CM Consult  Post Acute Care Choice:  Home Health Choice offered to:  Patient  DME Arranged:    DME Agency:     HH Arranged:  PT HH Agency:  [facility]  Status of Service:  In process, will continue to follow  If discussed at Long Length of Stay Meetings, dates discussed:    Additional Comments:  Austin [name], Austin OilerSharley Diane, RN 07/19/2017, 3:08 PM

## 2017-07-19 NOTE — Progress Notes (Signed)
Inpatient Diabetes Program Recommendations  AACE/ADA: New Consensus Statement on Inpatient Glycemic Control (2015)  Target Ranges:  Prepandial:   less than 140 mg/dL      Peak postprandial:   less than 180 mg/dL (1-2 hours)      Critically ill patients:  140 - 180 mg/dL  Results for Austin [name], Austin [name] (MRN [id number]) as of 07/19/2017 07:18  Ref. Range 07/19/2017 05:48  Glucose Latest Ref Range: 65 - 99 mg/dL 956272 (H)   Results for Austin [name], Austin [name] (MRN [id number]) as of 07/19/2017 07:18  Ref. Range 07/18/2017 07:38 07/18/2017 11:20 07/18/2017 16:10 07/18/2017 21:05  Glucose-Capillary Latest Ref Range: 65 - 99 mg/dL 469181 (H) 629182 (H) 528283 (H) 324 (H)   Review of Glycemic Control  Current orders for Inpatient glycemic control: Lantus 10 units BID, Novolog 0-15 units TID with meals, Novolog 0-5 units QHS  Inpatient Diabetes Program Recommendations: Insulin - Basal: Please consider increasing Lantus to 12 units BID. Insulin - Meal Coverage: If steroids are continued, please consider ordering Novolog 4 units TID with meals for meal coverage if patient eats at least 50% of meals.  Thanks, Orlando PennerMarie Yelena Metzer, RN, MSN, CDE Diabetes Coordinator Inpatient Diabetes Program [phone] (Team Pager from 8am to 5pm)

## 2017-07-19 NOTE — Progress Notes (Signed)
PROGRESS NOTE    Austin [name]  VQQ:[id number] DOB: 17-Apr-1941 DOA: 07/13/2017 PCP: [facility]    Brief Narrative:  Patient is a64 year old man with history of COPD, diastolic heart failure and chronic atrial fibrillation who presented on 07/13/17 with a several day history of progressivel and worsening shortness of breath. On the day of admission, patient had been outside to feed his dog, but slipped in the mud and was unable to get up by himself or with his wife's assistance. EMS was called and patient was reportedly hypoxic with decreased level of consciousness.  In the ED, initial vital signs were rectal temperature 97.19F, HR 86, BP 134/82 mmHg, respirations 28 and O2 sat 96% on BPAP. He was started on BiPAP ventilation, given 3000 mL of normal saline bolus, Solu-Medrol 125 mg IVP 1 dose, given bronchodilators, vancomycin and Zosyn. Labs revealed a WBC of 21.2 with 89% neutrophils, hemoglobin of 15.4 g/dL  and platelets 159. Sodium was 137, potassium 4.8, chloride 103, bicarbonate 16 and initial lactic acid 7.5 mmol/L. BUN was 33, creatinine 1.9, glucose 130 and bilirubin 2.1 mg/dL. His AST was mildly elevated at 46. BNP was 1769 pg/mL. EKG was sinus rhythm with without ischemic changes, but significant for RBBB, which has been present before. He was admitted for further evaluation and management.   Assessment & Plan:   Principal Problem:   Acute respiratory failure with hypoxia (HCC) Active Problems:   Type 2 diabetes mellitus (HCC)   HLD (hyperlipidemia)   GERD (gastroesophageal reflux disease)   Essential hypertension   Chronic pulmonary embolism (HCC)   COPD exacerbation (HCC)   Sepsis due to undetermined organism with acute respiratory failure (HCC)   Ascending aortic aneurysm (HCC)   Elevated bilirubin   Left nephrolithiasis   Lactic acidosis   Moderate to severe pulmonary hypertension (HCC)--per 2-D echo 07/14/2017   Mass of lung   Acute  lower UTI   Sepsis ([place])   Chronic atrial fibrillation (HCC)  #1 Acute respiratory failure with hypoxia secondary to acute diastolic heart failure. Patient presented with acute respiratory failure with hypoxia initially requiring BiPAP. Chest x-ray on 8/5 revealed pleural effusions mildly increased from 2 days prior, along with emphysema and likely pulmonary hypertension. -He was initially started on IV fluids. Due to liable blood pressures, diuretics were initially held. -Subsequently, IV Lasix was started which was titrated up to 40 mg IV every 12 hours. -Due to patient's prior history of PE and DVT, VQ scan ordered for evaluation and revealed no acute PE. Lower extremity Dopplers were negative for DVT. -2-D echocardiogram revealed grade 1 diastolic dysfunction, an EF of 65-70%, dynamic obstruction at rest and mid cavity with severely dilated right ventricular cavity size, systolic function moderately to severely reduced on the right which is worse than previous 2-D echo and worsening pulmonary artery systolic pressure with a PA peak pressure of 81 mmHg. - ABG had a pH of 7.38, PCO2 of 36, PO2 of 82, bicarbonate of 22.  -I/O's recorded our records is positive, but the patient has less peripheral edema on exam. BNP is down to 995 from 1769 on admission. Chest x-ray reveals stable vascular congestion and small pleural effusions. -We'll continue IV Lasix for another 24 hours before transitioning to oral Lasix.  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 VQ scan ordered for evaluation of hypoxia and was found to be negative for PE. 2-D echo revealed severe pulmonary hypertension with 81 mmHg and severely reduced  right heart systolic function. -Pulmonologist, Dr. Luan Pulling was consulted. He started the patient on sildenafil. -Patient will need to follow-up with the pulmonary hypertension clinic. -Currently stable.  Sepsis due to undetermined organism secondary  to/UTI On admission patient met criteria for sepsis with a lactic acidosis of 7.5  Patient also noted to have a leukocytosis with a white count of 21.2. -Patient was started on IV fluids and broad-spectrum antibiotics vancomycin and Zosyn. -Blood cultures and a urine culture ordered and both are negative to date. -Lactic acid trended down. -White blood cell count has normalized. -MRSA PCR was negative, so vancomycin was discontinued. -Given negative cultures, antibiotic therapy was narrowed to Rocephin.  COPD exacerbation causing chronic respiratory failure with hypoxia Patient on home O2 as needed. -Due to chest tightness and congestion noted, Solu-Medrol  60 mg IV every day, Pulmicort, Claritin, Flonase, Mucinex. Ruthe Mannan was discontinued. -Duo nebs were discontinued due to A. fib with RVR. He was placed on Xopenex and Atrovent nebulizers. Atrovent nebulizer was eventually discontinued. -Solu-Medrol titrated down and then discontinued. Prednisone started and will taper accordingly. -Clinically improving.  Prior history of PE and DVT Patient is not on anticoagulation prior to admission. Patient stated that he did not follow-up with one of his doctors and thus the reason why he is not on anticoagulation at this time. Due to patient's respiratory status and presentation and concern for DVT and PE  VQ scan was checked which was negative for pulmonary emboli. Lower extremity Dopplers were also negative for DVT. IV heparin has been discontinued. -It had been subsequently noted that patient had a prior history of GI bleed while on anticoagulation per family.   Chronic atrial fibrillation with RVR CHA2DS2Vasc score 6 Patient is treated chronically with metoprolol for rate control. Apparently, anticoagulation was discontinued due to a previous significant GI bleed. -Patient developed RVR with a heart rate in the 140s. -Cardizem drip was started. Metoprolol was continued. -Oral diltiazem was started at  30 mg every 6 hours due to soft blood pressures. Digoxin IV was given 1. -We'll start daily digoxin. We'll titrate up diltiazem as his blood pressure can tolerate it.   Diabetes mellitus type 2 Patient is treated chronically with glipizide and metformin. They were withheld. -Patient subsequently started on sliding scale NovoLog and Lantus due to IV steroid. - Lantus titrated up to twice a day on 8/8. Continue to titrate accordingly. -Hemoglobin A1c was 7.9.  Hypertension. Patient is treated chronically with metoprolol. It was continued. -Diltiazem added due to RVR. -Blood pressure has been soft at times, ranging in the upper 90s to lower 379K systolically. -We'll continue to monitor.  Chronic kidney disease stage II-III Patient's creatinine was 1.65 Jan./2018 and 1.29 Nov./2016. It was 1.9 on admission. -He was started on IV fluids. -IV fluids were discontinued due to acute diastolic heart failure/right heart failure exacerbation. -IV Lasix was started.  -His creatinine has improved to 1.31>> 1.44, better than baseline. -Continue to monitor in the setting of diuresis.  Incidental aortic ascending aneurysm Noted on CT. Outpatient follow-up.  Stable spiculated masses right middle lobe and right upper lobe Per CT scan chest without contrast 07/13/2017 which stated that given the long-term stability, benign process likely favored. Patient will need ongoing outpatient follow-up with pulmonary.  Thrombocytopenia. Patient's platelet count was within normal limits, but has decreased to 146. - Subcutaneous heparin and started Lovenox instead. Platelet count has improved.      DVT prophylaxis: Heparin>>> Lovenox Code Status:Full  Family Communication:Family not available  Disposition Plan:Discharge when clinically appropriate   Consultants:   Pulmonary: Dr. Luan Pulling 07/16/2017  Procedures:   Chest x-ray 07/13/2017  Plain films of the right tib-fib 07/13/2017  CT abdomen and  pelvis 07/13/2017  CT chest without contrast 07/13/2017  2-D echo 07/14/2017--- EF 65-70%, dynamic obstruction at rest and mid cavity, grade 1 diastolic dysfunction, severely dilated right ventricular cavity size, systolic function moderately to severely reduced, right atrium severely dilated, pulmonary artery systolic pressure severely increased PA peak pressure of 81 mmHg.  VQ scan 07/15/2017  Lower Extremity Dopplers 07/15/2017  Antimicrobials:   Rocephin 07/18/17 >>  IV Zosyn 07/13/2017>>>> 07/18/17   IV vancomycin 07/13/2017>>>>> 07/16/2017   Subjective: Patient denies chest pain or shortness of breath. He was to try to get up and move around more.  Objective: Vitals:   07/19/17 0643 07/19/17 0751 07/19/17 0755 07/19/17 0802  BP: 115/90     Pulse:   93   Resp:   19   Temp:   (!) 97.5 F (36.4 C)   TempSrc:   Axillary   SpO2:  97% 98% 100%  Weight:      Height:        Intake/Output Summary (Last 24 hours) at 07/19/17 1039 Last data filed at 07/19/17 0906  Gross per 24 hour  Intake              580 ml  Output              700 ml  Net             -120 ml   Filed Weights   07/17/17 0500 07/18/17 0500 07/19/17 0500  Weight: 97.4 kg (214 lb 11.7 oz) 94.9 kg (209 lb 3.5 oz) 92.8 kg (204 lb 9.4 oz)    Examination:  General exam: No acute distress.  Respiratory system: Rare/occasional scattered crackles; less. Breathing nonlabored.  Cardiovascular system: Irregularly irregular; borderline tachycardia. Bilateral lower extremity edema down to 1+. Gastrointestinal system: Abdomen is nondistended, soft and nontender. No organomegaly or masses felt. Normal bowel sounds heard. Central nervous system: Alert and oriented. No focal neurological deficits. Extremities: No acute hot red joints.  Psychiatry: Judgement and insight appear normal. Mood & affect appropriate.     Data Reviewed: I have personally reviewed following labs and imaging studies  CBC:  Recent Labs Lab  07/13/17 1901 07/14/17 0443 07/15/17 0446 07/16/17 0343 07/17/17 0547 07/18/17 0451 07/19/17 0548  WBC 21.2* 18.5* 14.2* 9.4 10.9* 9.4 10.6*  NEUTROABS 18.8* 17.2* 12.2*  --  9.6*  --   --   HGB 15.4 14.4 13.0 13.4 13.5 14.4 15.2  HCT 49.5 45.5 40.1 42.1 43.0 45.2 47.0  MCV 95.2 92.7 91.6 92.1 92.5 91.7 91.3  PLT 159 161 154 150 146* 146* 500   Basic Metabolic Panel:  Recent Labs Lab 07/15/17 0446 07/16/17 0343 07/17/17 0547 07/18/17 0451 07/19/17 0548  NA 135 135 136 135 133*  K 4.0 4.3 4.4 4.4 4.7  CL 101 105 103 102 101  CO2 _0 GLUCOSE 199* 298* 228* 195* 272*  BUN 44* 40* 37* 36* 38*  CREATININE 1.72* 1.48* 1.55* 1.31* 1.44*  CALCIUM 8.8* 8.9 9.4 9.5 9.5  MG  --  1.8  --   --   --    GFR: Estimated Creatinine Clearance: 48.6 mL/min (A) (by C-G formula based on SCr of 1.44 mg/dL (H)). Liver Function Tests:  Recent Labs Lab 07/13/17 1901 07/14/17 0443  AST  46* 37  ALT 15* 12*  ALKPHOS 76 71  BILITOT 2.1* 1.9*  PROT 6.8 6.0*  ALBUMIN 3.6 3.3*   No results for input(s): LIPASE, AMYLASE in the last 168 hours. No results for input(s): AMMONIA in the last 168 hours. Coagulation Profile: No results for input(s): INR, PROTIME in the last 168 hours. Cardiac Enzymes: No results for input(s): CKTOTAL, CKMB, CKMBINDEX, TROPONINI in the last 168 hours. BNP (last 3 results) No results for input(s): PROBNP in the last 8760 hours. HbA1C: No results for input(s): HGBA1C in the last 72 hours. CBG:  Recent Labs Lab 07/18/17 0738 07/18/17 1120 07/18/17 1610 07/18/17 2105 07/19/17 0753  GLUCAP 181* 182* 283* 324* 246*   Lipid Profile: No results for input(s): CHOL, HDL, LDLCALC, TRIG, CHOLHDL, LDLDIRECT in the last 72 hours. Thyroid Function Tests: No results for input(s): TSH, T4TOTAL, FREET4, T3FREE, THYROIDAB in the last 72 hours. Anemia Panel: No results for input(s): VITAMINB12, FOLATE, FERRITIN, TIBC, IRON, RETICCTPCT in the last 72  hours. Sepsis Labs:  Recent Labs Lab 07/14/17 1509 07/14/17 2052 07/15/17 0446 07/16/17 0343  LATICACIDVEN 3.3* 2.6* 2.7* 1.5    Recent Results (from the past 240 hour(s))  Blood Culture (routine x 2)     Status: None   Collection Time: 07/13/17  9:00 PM  Result Value Ref Range Status   Specimen Description BLOOD RIGHT FOREARM  Final   Special Requests   Final    Blood Culture results may not be optimal due to an inadequate volume of blood received in culture bottles   Culture NO GROWTH 5 DAYS  Final   Report Status 07/18/2017 FINAL  Final  Blood Culture (routine x 2)     Status: None   Collection Time: 07/13/17  9:00 PM  Result Value Ref Range Status   Specimen Description BLOOD LEFT FOREARM  Final   Special Requests   Final    Blood Culture results may not be optimal due to an inadequate volume of blood received in culture bottles   Culture NO GROWTH 5 DAYS  Final   Report Status 07/18/2017 FINAL  Final  MRSA PCR Screening     Status: None   Collection Time: 07/14/17 12:46 AM  Result Value Ref Range Status   MRSA by PCR NEGATIVE NEGATIVE Final    Comment:        The GeneXpert MRSA Assay (FDA approved for NASAL specimens only), is one component of a comprehensive MRSA colonization surveillance program. It is not intended to diagnose MRSA infection nor to guide or monitor treatment for MRSA infections.   Culture, Urine     Status: None   Collection Time: 07/15/17  2:15 PM  Result Value Ref Range Status   Specimen Description URINE, CLEAN CATCH  Final   Special Requests NONE  Final   Culture   Final    NO GROWTH Performed at [facility], [address] [place], [place] [address]    Report Status 07/17/2017 FINAL  Final         Radiology Studies: Dg Chest Port 1 View  Result Date: 07/18/2017 CLINICAL DATA:  CHF. EXAM: PORTABLE CHEST 1 VIEW COMPARISON:  Multiple prior chest x-rays, most recently dated yesterday. FINDINGS: Unchanged cardiomegaly.  Degree of vascular congestion is similar to prior study. Bibasilar opacities and small right greater than left pleural effusions are unchanged. No pneumothorax. IMPRESSION: Stable vascular congestion and small right greater than left pleural effusions. Electronically Signed   By: Huntley Dec  Derry M.D.   On: 07/18/2017 11:32        Scheduled Meds: . aspirin EC  81 mg Oral Daily  . budesonide (PULMICORT) nebulizer solution  0.5 mg Nebulization BID  . diltiazem  30 mg Oral Q6H  . fluticasone  2 spray Each Nare Daily  . furosemide  40 mg Intravenous Q12H  . guaiFENesin  1,200 mg Oral BID  . heparin subcutaneous  5,000 Units Subcutaneous Q8H  . insulin aspart  0-15 Units Subcutaneous TID WC  . insulin aspart  0-5 Units Subcutaneous QHS  . insulin glargine  10 Units Subcutaneous BID  . levalbuterol  0.63 mg Nebulization TID  . loratadine  10 mg Oral Daily  . metoprolol tartrate  12.5 mg Oral BID  . pantoprazole  40 mg Oral Daily  . pravastatin  40 mg Oral q1800  . predniSONE  40 mg Oral Q breakfast  . sildenafil  20 mg Oral TID   Continuous Infusions: . cefTRIAXone (ROCEPHIN)  IV Stopped (07/19/17 7282)  . sodium chloride       LOS: 6 days    Time spent: 40 mins    Rexene Alberts, MD [facility] Pager [phone]  If 7PM-7AM, please contact night-coverage [email or web address] Password [email or web address] 07/19/2017, 10:39 AM

## 2017-07-19 NOTE — Care Management Note (Signed)
Case Management Note  Patient Details  Name: Austin [name] MRN: [id number] Date of Birth: September 30, 1941   If discussed at Long Length of Stay Meetings, dates discussed:   07/19/2017 Additional Comments:  Adelina Collard, Chrystine OilerSharley Diane, RN 07/19/2017, 10:25 AM

## 2017-07-19 NOTE — Progress Notes (Signed)
Subjective: He says he feels better and wants to be more active today. He remains on high flow nasal cannula at 2-1/2-3 L with good oxygenation. He is not coughing as much. He has no chest pain nausea vomiting and no other complaints  Objective: Vital signs in last 24 hours: Temp:  [97.5 F (36.4 C)-98.1 F (36.7 C)] 98 F (36.7 C) (08/09 0400) Pulse Rate:  [75-137] 79 (08/09 0500) Resp:  [8-27] 21 (08/09 0500) BP: (95-140)/(74-105) 115/90 (08/09 0643) SpO2:  [85 %-96 %] 95 % (08/09 0500) Weight:  [92.8 kg (204 lb 9.4 oz)] 92.8 kg (204 lb 9.4 oz) (08/09 0500) Weight change: -2.1 kg (-4 lb 10.1 oz) Last BM Date: 07/18/17  Intake/Output from previous day: 08/08 0701 - 08/09 0700 In: 1062.3 [P.O.:840; I.V.:172.3; IV Piggyback:50] Out: 280 [Urine:875]  PHYSICAL EXAM General appearance: alert, cooperative and mild distress Resp: rhonchi bilaterally Cardio: irregularly irregular rhythm GI: soft, non-tender; bowel sounds normal; no masses,  no organomegaly Extremities: Some venous stasis and dependent edema Skin turgor normal  Lab Results:  Results for orders placed or performed during the hospital encounter of 07/13/17 (from the past 48 hour(s))  Glucose, capillary     Status: Abnormal   Collection Time: 07/17/17  8:09 AM  Result Value Ref Range   Glucose-Capillary 187 (H) 65 - 99 mg/dL  Glucose, capillary     Status: Abnormal   Collection Time: 07/17/17 12:17 PM  Result Value Ref Range   Glucose-Capillary 274 (H) 65 - 99 mg/dL  Glucose, capillary     Status: Abnormal   Collection Time: 07/17/17  4:16 PM  Result Value Ref Range   Glucose-Capillary 190 (H) 65 - 99 mg/dL  Glucose, capillary     Status: Abnormal   Collection Time: 07/17/17  9:24 PM  Result Value Ref Range   Glucose-Capillary 227 (H) 65 - 99 mg/dL  Basic metabolic panel     Status: Abnormal   Collection Time: 07/18/17  4:51 AM  Result Value Ref Range   Sodium 135 135 - 145 mmol/L   Potassium 4.4 3.5 - 5.1  mmol/L   Chloride 102 101 - 111 mmol/L   CO2 24 22 - 32 mmol/L   Glucose, Bld 195 (H) 65 - 99 mg/dL   BUN 36 (H) 6 - 20 mg/dL   Creatinine, Ser 1.31 (H) 0.61 - 1.24 mg/dL   Calcium 9.5 8.9 - 10.3 mg/dL   GFR calc non Af Amer 52 (L) >60 mL/min   GFR calc Af Amer 60 (L) >60 mL/min    Comment: (NOTE) The eGFR has been calculated using the CKD EPI equation. This calculation has not been validated in all clinical situations. eGFR's persistently <60 mL/min signify possible Chronic Kidney Disease.    Anion gap 9 5 - 15  CBC     Status: Abnormal   Collection Time: 07/18/17  4:51 AM  Result Value Ref Range   WBC 9.4 4.0 - 10.5 K/uL   RBC 4.93 4.22 - 5.81 MIL/uL   Hemoglobin 14.4 13.0 - 17.0 g/dL   HCT 45.2 39.0 - 52.0 %   MCV 91.7 78.0 - 100.0 fL   MCH 29.2 26.0 - 34.0 pg   MCHC 31.9 30.0 - 36.0 g/dL   RDW 17.4 (H) 11.5 - 15.5 %   Platelets 146 (L) 150 - 400 K/uL  Brain natriuretic peptide     Status: Abnormal   Collection Time: 07/18/17  4:51 AM  Result Value Ref Range   B  Natriuretic Peptide 995.0 (H) 0.0 - 100.0 pg/mL  Glucose, capillary     Status: Abnormal   Collection Time: 07/18/17  7:38 AM  Result Value Ref Range   Glucose-Capillary 181 (H) 65 - 99 mg/dL  Glucose, capillary     Status: Abnormal   Collection Time: 07/18/17 11:20 AM  Result Value Ref Range   Glucose-Capillary 182 (H) 65 - 99 mg/dL  Glucose, capillary     Status: Abnormal   Collection Time: 07/18/17  4:10 PM  Result Value Ref Range   Glucose-Capillary 283 (H) 65 - 99 mg/dL  Glucose, capillary     Status: Abnormal   Collection Time: 07/18/17  9:05 PM  Result Value Ref Range   Glucose-Capillary 324 (H) 65 - 99 mg/dL   Comment 1 Notify RN   CBC     Status: Abnormal   Collection Time: 07/19/17  5:48 AM  Result Value Ref Range   WBC 10.6 (H) 4.0 - 10.5 K/uL   RBC 5.15 4.22 - 5.81 MIL/uL   Hemoglobin 15.2 13.0 - 17.0 g/dL   HCT 47.0 39.0 - 52.0 %   MCV 91.3 78.0 - 100.0 fL   MCH 29.5 26.0 - 34.0 pg    MCHC 32.3 30.0 - 36.0 g/dL   RDW 17.1 (H) 11.5 - 15.5 %   Platelets 167 150 - 400 K/uL  Basic metabolic panel     Status: Abnormal   Collection Time: 07/19/17  5:48 AM  Result Value Ref Range   Sodium 133 (L) 135 - 145 mmol/L   Potassium 4.7 3.5 - 5.1 mmol/L   Chloride 101 101 - 111 mmol/L   CO2 26 22 - 32 mmol/L   Glucose, Bld 272 (H) 65 - 99 mg/dL   BUN 38 (H) 6 - 20 mg/dL   Creatinine, Ser 1.44 (H) 0.61 - 1.24 mg/dL   Calcium 9.5 8.9 - 10.3 mg/dL   GFR calc non Af Amer 46 (L) >60 mL/min   GFR calc Af Amer 53 (L) >60 mL/min    Comment: (NOTE) The eGFR has been calculated using the CKD EPI equation. This calculation has not been validated in all clinical situations. eGFR's persistently <60 mL/min signify possible Chronic Kidney Disease.    Anion gap 6 5 - 15    ABGS No results for input(s): PHART, PO2ART, TCO2, HCO3 in the last 72 hours.  Invalid input(s): PCO2 CULTURES Recent Results (from the past 240 hour(s))  Blood Culture (routine x 2)     Status: None   Collection Time: 07/13/17  9:00 PM  Result Value Ref Range Status   Specimen Description BLOOD RIGHT FOREARM  Final   Special Requests   Final    Blood Culture results may not be optimal due to an inadequate volume of blood received in culture bottles   Culture NO GROWTH 5 DAYS  Final   Report Status 07/18/2017 FINAL  Final  Blood Culture (routine x 2)     Status: None   Collection Time: 07/13/17  9:00 PM  Result Value Ref Range Status   Specimen Description BLOOD LEFT FOREARM  Final   Special Requests   Final    Blood Culture results may not be optimal due to an inadequate volume of blood received in culture bottles   Culture NO GROWTH 5 DAYS  Final   Report Status 07/18/2017 FINAL  Final  MRSA PCR Screening     Status: None   Collection Time: 07/14/17 12:46 AM  Result Value  Ref Range Status   MRSA by PCR NEGATIVE NEGATIVE Final    Comment:        The GeneXpert MRSA Assay (FDA approved for NASAL  specimens only), is one component of a comprehensive MRSA colonization surveillance program. It is not intended to diagnose MRSA infection nor to guide or monitor treatment for MRSA infections.   Culture, Urine     Status: None   Collection Time: 07/15/17  2:15 PM  Result Value Ref Range Status   Specimen Description URINE, CLEAN CATCH  Final   Special Requests NONE  Final   Culture   Final    NO GROWTH Performed at [facility], [address], [place], [place] [address]    Report Status 07/17/2017 FINAL  Final   Studies/Results: Dg Chest Port 1 View  Result Date: 07/18/2017 CLINICAL DATA:  CHF. EXAM: PORTABLE CHEST 1 VIEW COMPARISON:  Multiple prior chest x-rays, most recently dated yesterday. FINDINGS: Unchanged cardiomegaly. Degree of vascular congestion is similar to prior study. Bibasilar opacities and small right greater than left pleural effusions are unchanged. No pneumothorax. IMPRESSION: Stable vascular congestion and small right greater than left pleural effusions. Electronically Signed   By: Titus Dubin M.D.   On: 07/18/2017 11:32    Medications:  Prior to Admission:  Prescriptions Prior to Admission  Medication Sig Dispense Refill Last Dose  . albuterol (PROAIR HFA) 108 (90 Base) MCG/ACT inhaler Inhale 1-2 puffs into the lungs every 6 (six) hours as needed for wheezing or shortness of breath.   Past Week at Unknown time  . albuterol (PROVENTIL) (2.5 MG/3ML) 0.083% nebulizer solution Take 3 mLs (2.5 mg total) by nebulization every 6 (six) hours as needed for wheezing or shortness of breath. 75 mL 12 Past Week at Unknown time  . aspirin EC 81 MG tablet Take 81 mg by mouth daily.   07/13/2017 at Unknown time  . budesonide-formoterol (SYMBICORT) 160-4.5 MCG/ACT inhaler Inhale 2 puffs into the lungs 2 (two) times daily as needed (for shortness of breath).    Past Week at Unknown time  . furosemide (LASIX) 40 MG tablet Take 1 tablet (40 mg total) by mouth 2 (two)  times daily. 60 tablet 5 07/13/2017 at Unknown time  . glipiZIDE (GLUCOTROL) 5 MG tablet Take 5 mg by mouth 2 (two) times daily.   07/13/2017 at Unknown time  . lisinopril (PRINIVIL,ZESTRIL) 40 MG tablet Take 40 mg by mouth daily.   07/13/2017 at Unknown time  . lovastatin (MEVACOR) 40 MG tablet Take 40 mg by mouth at bedtime. Pt should take with food.    07/12/2017 at Unknown time  . metFORMIN (GLUCOPHAGE) 500 MG tablet Take 500 mg by mouth 2 (two) times daily with a meal.   07/13/2017 at Unknown time  . metoprolol tartrate (LOPRESSOR) 25 MG tablet Take 12.5 mg by mouth 2 (two) times daily.    07/13/2017 at 730a  . omeprazole (PRILOSEC) 40 MG capsule Take 40 mg by mouth daily.   07/13/2017 at Unknown time  . polyethylene glycol (MIRALAX) packet Take 17 g by mouth daily as needed for moderate constipation. 30 each 0 unknown  . nitroGLYCERIN (NITROSTAT) 0.4 MG SL tablet Place 0.4 mg under the tongue every 5 (five) minutes x 3 doses as needed for chest pain. If no relief call 911 or go to emergency room.   unknown   Scheduled: . aspirin EC  81 mg Oral Daily  . budesonide (PULMICORT) nebulizer solution  0.5 mg Nebulization  BID  . diltiazem  30 mg Oral Q6H  . fluticasone  2 spray Each Nare Daily  . furosemide  40 mg Intravenous Q12H  . guaiFENesin  1,200 mg Oral BID  . heparin subcutaneous  5,000 Units Subcutaneous Q8H  . insulin aspart  0-15 Units Subcutaneous TID WC  . insulin aspart  0-5 Units Subcutaneous QHS  . insulin glargine  10 Units Subcutaneous BID  . levalbuterol  0.63 mg Nebulization TID  . loratadine  10 mg Oral Daily  . methylPREDNISolone (SOLU-MEDROL) injection  60 mg Intravenous Daily  . metoprolol tartrate  12.5 mg Oral BID  . pantoprazole  40 mg Oral Daily  . pravastatin  40 mg Oral q1800  . sildenafil  20 mg Oral TID   Continuous: . cefTRIAXone (ROCEPHIN)  IV Stopped (07/18/17 1111)  . sodium chloride     ZOX:[id number], LORazepam, nitroGLYCERIN, polyethylene glycol  Assesment:  He was admitted with sepsis presumably secondary to urinary tract infection. He had acute hypoxic respiratory failure and COPD exacerbation. On echocardiogram he has grade 1 left ventricular diastolic dysfunction severely dilated right ventricle and elevated pulmonary artery pressure. He is on sildenafil. He has chronic atrial fib not on anticoagulation because of a history of GI bleeding. He seems significantly deconditioned but is interested in trying to be more active. Chest x-ray done yesterday that I have personally reviewed is essentially unchanged although clinically the patient is much better Principal Problem:   Acute respiratory failure with hypoxia ([place]) Active Problems:   Type 2 diabetes mellitus ([place])   HLD (hyperlipidemia)   GERD (gastroesophageal reflux disease)   Essential hypertension   Chronic pulmonary embolism (HCC)   COPD exacerbation (HCC)   Sepsis due to undetermined organism with acute respiratory failure (HCC)   Ascending aortic aneurysm (HCC)   Elevated bilirubin   Left nephrolithiasis   Lactic acidosis   Moderate to severe pulmonary hypertension (HCC)--per 2-D echo 07/14/2017   Mass of lung   Acute lower UTI   Sepsis ([place])   Chronic atrial fibrillation ([place])    Plan: Continue current treatments. Try to get him to be more active. Switch from Solu-Medrol to oral prednisone. I think okay to switch to oral antibiotic at least from a pulmonary point of view    LOS: 6 days   Lowery Paullin L 07/19/2017, 7:26 AM

## 2017-07-20 ENCOUNTER — Inpatient Hospital Stay (HOSPITAL_COMMUNITY): Payer: Medicare Other

## 2017-07-20 DIAGNOSIS — J441 Chronic obstructive pulmonary disease with (acute) exacerbation: Secondary | ICD-10-CM

## 2017-07-20 LAB — BASIC METABOLIC PANEL
Anion gap: 8 (ref 5–15)
BUN: 38 mg/dL — AB (ref 6–20)
CALCIUM: 9.8 mg/dL (ref 8.9–10.3)
CHLORIDE: 97 mmol/L — AB (ref 101–111)
CO2: 27 mmol/L (ref 22–32)
CREATININE: 1.59 mg/dL — AB (ref 0.61–1.24)
GFR, EST AFRICAN AMERICAN: 47 mL/min — AB (ref >60)
GFR, EST NON AFRICAN AMERICAN: 41 mL/min — AB (ref >60)
Glucose, Bld: 316 mg/dL — ABNORMAL HIGH (ref 65–99)
Potassium: 4.9 mmol/L (ref 3.5–5.1)
SODIUM: 132 mmol/L — AB (ref 135–145)

## 2017-07-20 LAB — GLUCOSE, CAPILLARY
GLUCOSE-CAPILLARY: 250 mg/dL — AB (ref 65–99)
GLUCOSE-CAPILLARY: 330 mg/dL — AB (ref 65–99)
Glucose-Capillary: 196 mg/dL — ABNORMAL HIGH (ref 65–99)

## 2017-07-20 MED ORDER — ALUM & MAG HYDROXIDE-SIMETH 200-200-20 MG/5ML PO SUSP
15.0000 mL | Freq: Once | ORAL | Status: AC
  Administered 2017-07-20: 15 mL via ORAL
  Filled 2017-07-20: qty 30

## 2017-07-20 MED ORDER — METFORMIN HCL 500 MG PO TABS
250.0000 mg | ORAL_TABLET | Freq: Two times a day (BID) | ORAL | Status: DC
  Administered 2017-07-20: 250 mg via ORAL
  Filled 2017-07-20: qty 1

## 2017-07-20 MED ORDER — DIGOXIN 125 MCG PO TABS
0.0625 mg | ORAL_TABLET | Freq: Every day | ORAL | Status: DC
  Administered 2017-07-20: 0.0625 mg via ORAL
  Filled 2017-07-20: qty 1

## 2017-07-20 MED ORDER — FUROSEMIDE 40 MG PO TABS
60.0000 mg | ORAL_TABLET | Freq: Every day | ORAL | Status: DC
  Administered 2017-07-20: 60 mg via ORAL
  Filled 2017-07-20: qty 1

## 2017-07-20 NOTE — Care Management Important Message (Signed)
Important Message  Patient Details  Name: Austin [name] MRN: [id number] Date of Birth: 11-17-1941   Medicare Important Message Given:  Yes    Verline Kong, Chrystine OilerSharley Diane, RN 07/20/2017, 3:05 PM

## 2017-07-20 NOTE — Care Management (Signed)
[facility].

## 2017-07-20 NOTE — Progress Notes (Signed)
Inpatient Diabetes Program Recommendations  AACE/ADA: New Consensus Statement on Inpatient Glycemic Control (2015)  Target Ranges:  Prepandial:   less than 140 mg/dL      Peak postprandial:   less than 180 mg/dL (1-2 hours)      Critically ill patients:  140 - 180 mg/dL   Results for Austin [name], Austin [name] (MRN [id number]) as of 07/20/2017 11:07  Ref. Range 07/19/2017 07:53 07/19/2017 11:31 07/19/2017 16:24 07/19/2017 21:19 07/20/2017 07:40  Glucose-Capillary Latest Ref Range: 65 - 99 mg/dL 914246 (H) 782351 (H) 956232 (H) 265 (H) 250 (H)   Review of Glycemic Control  Current orders for Inpatient glycemic control: Lantus 10 units BID, Novolog 0-15 units TID with meals, Novolog 0-5 units QHS, Metformin 250 mg BID  Inpatient Diabetes Program Recommendations: Insulin - Basal: Please consider increasing Lantus to 12 units BID. Insulin - Meal Coverage: If steroids are continued, please consider ordering Novolog 4 units TID with meals for meal coverage if patient eats at least 50% of meals.  Thanks, Orlando PennerMarie Nardos Putnam, RN, MSN, CDE Diabetes Coordinator Inpatient Diabetes Program [phone] (Team Pager from 8am to 5pm)

## 2017-07-20 NOTE — Plan of Care (Signed)
Problem: Cardiac: Goal: Ability to achieve and maintain adequate cardiopulmonary perfusion will improve Outcome: Completed/Met Date Met: 07/20/17 Patient back in Sinus Rhythm. Heart rate doing well in the 60-80s even when walking.

## 2017-07-20 NOTE — Progress Notes (Signed)
Subjective: He says he feels better. His oxygenation is better. He developed more problems with atrial fib with RVR yesterday but this morning seems to be in sinus bradycardia. He says his breathing is doing much better. No other complaints. No chest pain no nausea or vomiting  Objective: Vital signs in last 24 hours: Temp:  [98.2 F (36.8 C)-99.4 F (37.4 C)] 98.6 F (37 C) (08/10 0742) Pulse Rate:  [57-141] 59 (08/10 0500) Resp:  [11-27] 13 (08/10 0500) BP: (92-141)/(66-99) 124/85 (08/10 0554) SpO2:  [85 %-99 %] 99 % (08/10 0800) Weight:  [93.7 kg (206 lb 9.1 oz)] 93.7 kg (206 lb 9.1 oz) (08/10 0600) Weight change: 0.9 kg (1 lb 15.8 oz) Last BM Date: 07/18/17  Intake/Output from previous day: 08/09 0701 - 08/10 0700 In: 460 [P.O.:360; IV Piggyback:100] Out: 100 [Urine:100]  PHYSICAL EXAM General appearance: alert, cooperative and no distress Resp: clear to auscultation bilaterally Cardio: regular rate and rhythm, S1, S2 normal, no murmur, click, rub or gallop GI: soft, non-tender; bowel sounds normal; no masses,  no organomegaly Extremities: extremities normal, atraumatic, no cyanosis or edema Skin warm and dry. Skin turgor good  Lab Results:  Results for orders placed or performed during the hospital encounter of 07/13/17 (from the past 48 hour(s))  Glucose, capillary     Status: Abnormal   Collection Time: 07/18/17 11:20 AM  Result Value Ref Range   Glucose-Capillary 182 (H) 65 - 99 mg/dL  Glucose, capillary     Status: Abnormal   Collection Time: 07/18/17  4:10 PM  Result Value Ref Range   Glucose-Capillary 283 (H) 65 - 99 mg/dL  Glucose, capillary     Status: Abnormal   Collection Time: 07/18/17  9:05 PM  Result Value Ref Range   Glucose-Capillary 324 (H) 65 - 99 mg/dL   Comment 1 Notify RN   CBC     Status: Abnormal   Collection Time: 07/19/17  5:48 AM  Result Value Ref Range   WBC 10.6 (H) 4.0 - 10.5 K/uL   RBC 5.15 4.22 - 5.81 MIL/uL   Hemoglobin 15.2 13.0  - 17.0 g/dL   HCT 47.0 39.0 - 52.0 %   MCV 91.3 78.0 - 100.0 fL   MCH 29.5 26.0 - 34.0 pg   MCHC 32.3 30.0 - 36.0 g/dL   RDW 17.1 (H) 11.5 - 15.5 %   Platelets 167 150 - 400 K/uL  Basic metabolic panel     Status: Abnormal   Collection Time: 07/19/17  5:48 AM  Result Value Ref Range   Sodium 133 (L) 135 - 145 mmol/L   Potassium 4.7 3.5 - 5.1 mmol/L   Chloride 101 101 - 111 mmol/L   CO2 26 22 - 32 mmol/L   Glucose, Bld 272 (H) 65 - 99 mg/dL   BUN 38 (H) 6 - 20 mg/dL   Creatinine, Ser 1.44 (H) 0.61 - 1.24 mg/dL   Calcium 9.5 8.9 - 10.3 mg/dL   GFR calc non Af Amer 46 (L) >60 mL/min   GFR calc Af Amer 53 (L) >60 mL/min    Comment: (NOTE) The eGFR has been calculated using the CKD EPI equation. This calculation has not been validated in all clinical situations. eGFR's persistently <60 mL/min signify possible Chronic Kidney Disease.    Anion gap 6 5 - 15  Glucose, capillary     Status: Abnormal   Collection Time: 07/19/17  7:53 AM  Result Value Ref Range   Glucose-Capillary 246 (H) 65 -  99 mg/dL  Glucose, capillary     Status: Abnormal   Collection Time: 07/19/17 11:31 AM  Result Value Ref Range   Glucose-Capillary 351 (H) 65 - 99 mg/dL  Glucose, capillary     Status: Abnormal   Collection Time: 07/19/17  4:24 PM  Result Value Ref Range   Glucose-Capillary 232 (H) 65 - 99 mg/dL  Glucose, capillary     Status: Abnormal   Collection Time: 07/19/17  9:19 PM  Result Value Ref Range   Glucose-Capillary 265 (H) 65 - 99 mg/dL   Comment 1 Notify RN   Glucose, capillary     Status: Abnormal   Collection Time: 07/20/17  7:40 AM  Result Value Ref Range   Glucose-Capillary 250 (H) 65 - 99 mg/dL   Comment 1 Notify RN    Comment 2 Document in Chart     ABGS No results for input(s): PHART, PO2ART, TCO2, HCO3 in the last 72 hours.  Invalid input(s): PCO2 CULTURES Recent Results (from the past 240 hour(s))  Blood Culture (routine x 2)     Status: None   Collection Time: 07/13/17   9:00 PM  Result Value Ref Range Status   Specimen Description BLOOD RIGHT FOREARM  Final   Special Requests   Final    Blood Culture results may not be optimal due to an inadequate volume of blood received in culture bottles   Culture NO GROWTH 5 DAYS  Final   Report Status 07/18/2017 FINAL  Final  Blood Culture (routine x 2)     Status: None   Collection Time: 07/13/17  9:00 PM  Result Value Ref Range Status   Specimen Description BLOOD LEFT FOREARM  Final   Special Requests   Final    Blood Culture results may not be optimal due to an inadequate volume of blood received in culture bottles   Culture NO GROWTH 5 DAYS  Final   Report Status 07/18/2017 FINAL  Final  MRSA PCR Screening     Status: None   Collection Time: 07/14/17 12:46 AM  Result Value Ref Range Status   MRSA by PCR NEGATIVE NEGATIVE Final    Comment:        The GeneXpert MRSA Assay (FDA approved for NASAL specimens only), is one component of a comprehensive MRSA colonization surveillance program. It is not intended to diagnose MRSA infection nor to guide or monitor treatment for MRSA infections.   Culture, Urine     Status: None   Collection Time: 07/15/17  2:15 PM  Result Value Ref Range Status   Specimen Description URINE, CLEAN CATCH  Final   Special Requests NONE  Final   Culture   Final    NO GROWTH Performed at [facility], [address] [place], [place] [address]    Report Status 07/17/2017 FINAL  Final   Studies/Results: Dg Chest Port 1 View  Result Date: 07/18/2017 CLINICAL DATA:  CHF. EXAM: PORTABLE CHEST 1 VIEW COMPARISON:  Multiple prior chest x-rays, most recently dated yesterday. FINDINGS: Unchanged cardiomegaly. Degree of vascular congestion is similar to prior study. Bibasilar opacities and small right greater than left pleural effusions are unchanged. No pneumothorax. IMPRESSION: Stable vascular congestion and small right greater than left pleural effusions. Electronically Signed    By: Titus Dubin M.D.   On: 07/18/2017 11:32    Medications:  Prior to Admission:  Prescriptions Prior to Admission  Medication Sig Dispense Refill Last Dose  . albuterol (PROAIR HFA) 108 (90  Base) MCG/ACT inhaler Inhale 1-2 puffs into the lungs every 6 (six) hours as needed for wheezing or shortness of breath.   Past Week at Unknown time  . albuterol (PROVENTIL) (2.5 MG/3ML) 0.083% nebulizer solution Take 3 mLs (2.5 mg total) by nebulization every 6 (six) hours as needed for wheezing or shortness of breath. 75 mL 12 Past Week at Unknown time  . aspirin EC 81 MG tablet Take 81 mg by mouth daily.   07/13/2017 at Unknown time  . budesonide-formoterol (SYMBICORT) 160-4.5 MCG/ACT inhaler Inhale 2 puffs into the lungs 2 (two) times daily as needed (for shortness of breath).    Past Week at Unknown time  . furosemide (LASIX) 40 MG tablet Take 1 tablet (40 mg total) by mouth 2 (two) times daily. 60 tablet 5 07/13/2017 at Unknown time  . glipiZIDE (GLUCOTROL) 5 MG tablet Take 5 mg by mouth 2 (two) times daily.   07/13/2017 at Unknown time  . lisinopril (PRINIVIL,ZESTRIL) 40 MG tablet Take 40 mg by mouth daily.   07/13/2017 at Unknown time  . lovastatin (MEVACOR) 40 MG tablet Take 40 mg by mouth at bedtime. Pt should take with food.    07/12/2017 at Unknown time  . metFORMIN (GLUCOPHAGE) 500 MG tablet Take 500 mg by mouth 2 (two) times daily with a meal.   07/13/2017 at Unknown time  . metoprolol tartrate (LOPRESSOR) 25 MG tablet Take 12.5 mg by mouth 2 (two) times daily.    07/13/2017 at 730a  . omeprazole (PRILOSEC) 40 MG capsule Take 40 mg by mouth daily.   07/13/2017 at Unknown time  . polyethylene glycol (MIRALAX) packet Take 17 g by mouth daily as needed for moderate constipation. 30 each 0 unknown  . nitroGLYCERIN (NITROSTAT) 0.4 MG SL tablet Place 0.4 mg under the tongue every 5 (five) minutes x 3 doses as needed for chest pain. If no relief call 911 or go to emergency room.   unknown   Scheduled: . aspirin  EC  81 mg Oral Daily  . budesonide (PULMICORT) nebulizer solution  0.5 mg Nebulization BID  . digoxin  0.125 mg Oral Daily  . diltiazem  30 mg Oral Q6H  . fluticasone  2 spray Each Nare Daily  . furosemide  40 mg Intravenous Daily  . guaiFENesin  1,200 mg Oral BID  . heparin subcutaneous  5,000 Units Subcutaneous Q8H  . insulin aspart  0-15 Units Subcutaneous TID WC  . insulin aspart  0-5 Units Subcutaneous QHS  . insulin glargine  10 Units Subcutaneous BID  . levalbuterol  0.63 mg Nebulization TID  . loratadine  10 mg Oral Daily  . metoprolol tartrate  12.5 mg Oral BID  . pantoprazole  40 mg Oral Daily  . pravastatin  40 mg Oral q1800  . predniSONE  40 mg Oral Q breakfast  . sildenafil  20 mg Oral TID   Continuous: . cefTRIAXone (ROCEPHIN)  IV Stopped (07/19/17 3154)  . diltiazem (CARDIZEM) infusion Stopped (07/20/17 0744)  . sodium chloride     MGQ:[id number], LORazepam, nitroGLYCERIN, polyethylene glycol  Assesment: He was admitted with acute hypoxic respiratory failure and COPD exacerbation. He was septic on admission and that's improving. He has multiple other medical problems as listed. He has pulmonary hypertension will need to be reevaluated as an outpatient. He is back in sinus rhythm now. He is slowly improving Principal Problem:   Acute respiratory failure with hypoxia ([facility]) Active Problems:   Type 2 diabetes mellitus ([place])   HLD (  hyperlipidemia)   GERD (gastroesophageal reflux disease)   Essential hypertension   Chronic pulmonary embolism (H[facility])   COPD exacerbation (H[facility])   Sepsis due to undetermined organism with acute respiratory failure (HCC)   Ascending aortic aneurysm (HCC)   Elevated bilirubin   Left nephrolithiasis   Lactic acidosis   Moderate to severe pulmonary hypertension (HCC)--per 2-D echo 07/14/2017   Mass of lung   Acute lower UTI   Sepsis ([place])   Chronic atrial fibrillation ([place])    Plan: Continue current treatments    LOS: 7 days    Chazz Philson L 07/20/2017, 8:12 AM

## 2017-07-20 NOTE — Progress Notes (Signed)
PROGRESS NOTE    Austin [name]  JOA:[id number] DOB: Sep 02, 1941 DOA: 07/13/2017 PCP: [facility]    Brief Narrative:  Patient is a35 year old man with history of COPD, diastolic heart failure and chronic atrial fibrillation who presented on 07/13/17 with a several day history of progressivel and worsening shortness of breath. On the day of admission, patient had been outside to feed his dog, but slipped in the mud and was unable to get up by himself or with his wife's assistance. EMS was called and patient was reportedly hypoxic with decreased level of consciousness.  In the ED, initial vital signs were rectal temperature 97.41F, HR 86, BP 134/82 mmHg, respirations 28 and O2 sat 96% on BPAP. He was started on BiPAP ventilation, given 3000 mL of normal saline bolus, Solu-Medrol 125 mg IVP 1 dose, given bronchodilators, vancomycin and Zosyn. Labs revealed a WBC of 21.2 with 89% neutrophils, hemoglobin of 15.4 g/dL  and platelets 159. Sodium was 137, potassium 4.8, chloride 103, bicarbonate 16 and initial lactic acid 7.5 mmol/L. BUN was 33, creatinine 1.9, glucose 130 and bilirubin 2.1 mg/dL. His AST was mildly elevated at 46. BNP was 1769 pg/mL. EKG was sinus rhythm with without ischemic changes, but significant for RBBB, which has been present before. He was admitted for further evaluation and management.   Assessment & Plan:   Principal Problem:   Acute respiratory failure with hypoxia (HCC) Active Problems:   Type 2 diabetes mellitus (HCC)   HLD (hyperlipidemia)   GERD (gastroesophageal reflux disease)   Essential hypertension   Chronic pulmonary embolism (HCC)   COPD exacerbation (HCC)   Sepsis due to undetermined organism with acute respiratory failure (HCC)   Ascending aortic aneurysm (HCC)   Elevated bilirubin   Left nephrolithiasis   Lactic acidosis   Moderate to severe pulmonary hypertension (HCC)--per 2-D echo 07/14/2017   Mass of lung   Acute  lower UTI   Sepsis ([place])   Chronic atrial fibrillation (HCC)  #1 Acute respiratory failure with hypoxia secondary to acute diastolic heart failure. Patient presented with acute respiratory failure with hypoxia initially requiring BiPAP. Chest x-ray on 8/5 revealed pleural effusions mildly increased from 2 days prior, along with emphysema and likely pulmonary hypertension. -He was initially started on IV fluids. Due to liable blood pressures, diuretics were initially held. -Subsequently, IV Lasix was started which was titrated up to 40 mg IV every 12 hours. -Due to patient's prior history of PE and DVT, VQ scan ordered for evaluation and revealed no acute PE. Lower extremity Dopplers were negative for DVT. -2-D echocardiogram revealed grade 1 diastolic dysfunction, an EF of 65-70%, dynamic obstruction at rest and mid cavity with severely dilated right ventricular cavity size, systolic function moderately to severely reduced on the right which is worse than previous 2-D echo and worsening pulmonary artery systolic pressure with a PA peak pressure of 81 mmHg. - ABG had a pH of 7.38, PCO2 of 36, PO2 of 82, bicarbonate of 22.  -I/O's recorded are positive, but the patient has less peripheral edema on exam. BNP is down to 995 from 1769 on admission. Chest x-ray on 8/8 revealed stable vascular congestion and small pleural effusions. -We'll change Lasix to by mouth.  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 VQ scan ordered for evaluation of hypoxia and was found to be negative for PE. 2-D echo revealed severe pulmonary hypertension with 81 mmHg and severely reduced right heart systolic function. -Pulmonologist, Dr. Luan Pulling  was consulted. He started the patient on sildenafil. -Patient will need to follow-up with the pulmonary hypertension clinic. -Currently stable.  Sepsis due to undetermined organism secondary to/UTI On admission patient met criteria for  sepsis with a lactic acidosis of 7.5  Patient also noted to have a leukocytosis with a white count of 21.2. -Patient was started on IV fluids and broad-spectrum antibiotics vancomycin and Zosyn. -Blood cultures and a urine culture ordered and both are negative to date. -Lactic acid trended down. -White blood cell count has normalized. -MRSA PCR was negative, so vancomycin was discontinued. -Given negative cultures, antibiotic therapy was narrowed to Rocephin.  COPD exacerbation causing chronic respiratory failure with hypoxia Patient on home O2 as needed. -Due to chest tightness and congestion noted, Solu-Medrol  60 mg IV every day, Pulmicort, Claritin, Flonase, Mucinex. Ruthe Mannan was discontinued. -Duo nebs were discontinued due to A. fib with RVR. He was placed on Xopenex and Atrovent nebulizers. Atrovent nebulizer was eventually discontinued. -Solu-Medrol titrated down and then discontinued. Prednisone started and will taper accordingly. -Clinically improving.  Prior history of PE and DVT Patient is not on anticoagulation prior to admission. Patient stated that he did not follow-up with one of his doctors and thus the reason why he is not on anticoagulation at this time. Due to patient's respiratory status and presentation and concern for DVT and PE  VQ scan was checked which was negative for pulmonary emboli. Lower extremity Dopplers were also negative for DVT. IV heparin has been discontinued. -It had been subsequently noted that patient had a prior history of GI bleed while on anticoagulation per family.   Chronic atrial fibrillation with RVR CHA2DS2Vasc score 6 Patient is treated chronically with metoprolol for rate control. Apparently, anticoagulation was discontinued due to a previous significant GI bleed. -Patient developed RVR with a heart rate in the 140s. -Cardizem drip was started. Metoprolol was continued. -Oral diltiazem was started at 30 mg every 6 hours due to soft blood  pressures. Digoxin IV was given 1. -Daily digoxin started.  -Heart rate is trending toward bradycardic. Will decrease digoxin by half. Hold metoprolol, digoxin, and Cardizem for heart rate of less than 55.    Diabetes mellitus type 2 Patient is treated chronically with glipizide and metformin. They were withheld. -Patient subsequently started on sliding scale NovoLog and Lantus due to IV steroid. - Lantus titrated up to twice a day on 8/8. Continue to titrate accordingly. -We'll restart metformin at a lower dose.  -Hemoglobin A1c was 7.9.  Hypertension. Patient is treated chronically with metoprolol. It was continued. -Diltiazem added due to RVR. -Blood pressure has been soft at times, ranging in the upper 90s to lower 539J systolically. -We'll continue to monitor.  Chronic kidney disease stage II-III Patient's creatinine was 1.65 Jan./2018 and 1.29 Nov./2016. It was 1.9 on admission. -He was started on IV fluids. -IV fluids were discontinued due to acute diastolic heart failure/right heart failure exacerbation. -IV Lasix was started.  -His creatinine has improved to 1.31>> 1.44, better than baseline. -Continue to monitor in the setting of diuresis. Basic metabolic labs pending for 8/10.   Incidental aortic ascending aneurysm  Noted on CT. Outpatient follow-up.  Stable spiculated masses right middle lobe and right upper lobe Per CT scan chest without contrast 07/13/2017 which stated that given the long-term stability, benign process likely favored. Patient will need ongoing outpatient follow-up with pulmonary.  Thrombocytopenia. Patient's platelet count was within normal limits, but has decreased to 146. - Subcutaneous heparin and started  Lovenox instead. Platelet count has improved.       DVT prophylaxis: Heparin>>> Lovenox Code Status:Full  Family Communication:Family not available  Disposition Plan:Discharge when clinically appropriate; likely in the next day or 2.  Transfer to telemetry if HR remained stable; out of bed to the chair and ambulate as much as possible.    Consultants:   Pulmonary: Dr. Luan Pulling 07/16/2017  Procedures:   Chest x-ray 07/13/2017  Plain films of the right tib-fib 07/13/2017  CT abdomen and pelvis 07/13/2017  CT chest without contrast 07/13/2017  2-D echo 07/14/2017--- EF 65-70%, dynamic obstruction at rest and mid cavity, grade 1 diastolic dysfunction, severely dilated right ventricular cavity size, systolic function moderately to severely reduced, right atrium severely dilated, pulmonary artery systolic pressure severely increased PA peak pressure of 81 mmHg.  VQ scan 07/15/2017  Lower Extremity Dopplers 07/15/2017  Antimicrobials:   Rocephin 07/18/17 >>  IV Zosyn 07/13/2017>>>> 07/18/17   IV vancomycin 07/13/2017>>>>> 07/16/2017   Subjective: Patient denies chest pain or shortness of breath. He was to try to get up and move around more.  Objective: Vitals:   07/20/17 0600 07/20/17 0742 07/20/17 0754 07/20/17 0800  BP:      Pulse:      Resp:      Temp:  98.6 F (37 C)    TempSrc:  Oral    SpO2:   98% 99%  Weight: 93.7 kg (206 lb 9.1 oz)     Height:      Blood pressure 124/85. Respiratory rate 13. Heart rate 59.   Intake/Output Summary (Last 24 hours) at 07/20/17 0923 Last data filed at 07/20/17 0846  Gross per 24 hour  Intake              840 ml  Output              350 ml  Net              490 ml   Filed Weights   07/18/17 0500 07/19/17 0500 07/20/17 0600  Weight: 94.9 kg (209 lb 3.5 oz) 92.8 kg (204 lb 9.4 oz) 93.7 kg (206 lb 9.1 oz)    Examination:  General exam: No acute distress.  Respiratory system: Rare/occasional scattered crackles; less. Breathing nonlabored.  Cardiovascular system: Irregularly irregular with bradycardia. Bilateral lower extremity edema down to trace. Gastrointestinal system: Abdomen is nondistended, soft and nontender. No organomegaly or masses felt. Normal bowel  sounds heard. Central nervous system: Alert and oriented. No focal neurological deficits. Extremities: No acute hot red joints.  Psychiatry: Judgement and insight appear normal. Mood & affect appropriate.     Data Reviewed: I have personally reviewed following labs and imaging studies  CBC:  Recent Labs Lab 07/13/17 1901 07/14/17 0443 07/15/17 0446 07/16/17 0343 07/17/17 0547 07/18/17 0451 07/19/17 0548  WBC 21.2* 18.5* 14.2* 9.4 10.9* 9.4 10.6*  NEUTROABS 18.8* 17.2* 12.2*  --  9.6*  --   --   HGB 15.4 14.4 13.0 13.4 13.5 14.4 15.2  HCT 49.5 45.5 40.1 42.1 43.0 45.2 47.0  MCV 95.2 92.7 91.6 92.1 92.5 91.7 91.3  PLT 159 161 154 150 146* 146* 106   Basic Metabolic Panel:  Recent Labs Lab 07/15/17 0446 07/16/17 0343 07/17/17 0547 07/18/17 0451 07/19/17 0548  NA 135 135 136 135 133*  K 4.0 4.3 4.4 4.4 4.7  CL 101 105 103 102 101  CO2 _0 GLUCOSE 199* 298* 228* 195* 272*  BUN 44*  40* 37* 36* 38*  CREATININE 1.72* 1.48* 1.55* 1.31* 1.44*  CALCIUM 8.8* 8.9 9.4 9.5 9.5  MG  --  1.8  --   --   --    GFR: Estimated Creatinine Clearance: 52.7 mL/min (A) (by C-G formula based on SCr of 1.44 mg/dL (H)). Liver Function Tests:  Recent Labs Lab 07/13/17 1901 07/14/17 0443  AST 46* 37  ALT 15* 12*  ALKPHOS 76 71  BILITOT 2.1* 1.9*  PROT 6.8 6.0*  ALBUMIN 3.6 3.3*   No results for input(s): LIPASE, AMYLASE in the last 168 hours. No results for input(s): AMMONIA in the last 168 hours. Coagulation Profile: No results for input(s): INR, PROTIME in the last 168 hours. Cardiac Enzymes: No results for input(s): CKTOTAL, CKMB, CKMBINDEX, TROPONINI in the last 168 hours. BNP (last 3 results) No results for input(s): PROBNP in the last 8760 hours. HbA1C: No results for input(s): HGBA1C in the last 72 hours. CBG:  Recent Labs Lab 07/19/17 0753 07/19/17 1131 07/19/17 1624 07/19/17 2119 07/20/17 0740  GLUCAP 246* 351* 232* 265* 250*   Lipid  Profile: No results for input(s): CHOL, HDL, LDLCALC, TRIG, CHOLHDL, LDLDIRECT in the last 72 hours. Thyroid Function Tests: No results for input(s): TSH, T4TOTAL, FREET4, T3FREE, THYROIDAB in the last 72 hours. Anemia Panel: No results for input(s): VITAMINB12, FOLATE, FERRITIN, TIBC, IRON, RETICCTPCT in the last 72 hours. Sepsis Labs:  Recent Labs Lab 07/14/17 1509 07/14/17 2052 07/15/17 0446 07/16/17 0343  LATICACIDVEN 3.3* 2.6* 2.7* 1.5    Recent Results (from the past 240 hour(s))  Blood Culture (routine x 2)     Status: None   Collection Time: 07/13/17  9:00 PM  Result Value Ref Range Status   Specimen Description BLOOD RIGHT FOREARM  Final   Special Requests   Final    Blood Culture results may not be optimal due to an inadequate volume of blood received in culture bottles   Culture NO GROWTH 5 DAYS  Final   Report Status 07/18/2017 FINAL  Final  Blood Culture (routine x 2)     Status: None   Collection Time: 07/13/17  9:00 PM  Result Value Ref Range Status   Specimen Description BLOOD LEFT FOREARM  Final   Special Requests   Final    Blood Culture results may not be optimal due to an inadequate volume of blood received in culture bottles   Culture NO GROWTH 5 DAYS  Final   Report Status 07/18/2017 FINAL  Final  MRSA PCR Screening     Status: None   Collection Time: 07/14/17 12:46 AM  Result Value Ref Range Status   MRSA by PCR NEGATIVE NEGATIVE Final    Comment:        The GeneXpert MRSA Assay (FDA approved for NASAL specimens only), is one component of a comprehensive MRSA colonization surveillance program. It is not intended to diagnose MRSA infection nor to guide or monitor treatment for MRSA infections.   Culture, Urine     Status: None   Collection Time: 07/15/17  2:15 PM  Result Value Ref Range Status   Specimen Description URINE, CLEAN CATCH  Final   Special Requests NONE  Final   Culture   Final    NO GROWTH Performed at [facility],  [address] [place], [place] [address]    Report Status 07/17/2017 FINAL  Final         Radiology Studies: Dg Chest Port 1 View  Result Date:  07/18/2017 CLINICAL DATA:  CHF. EXAM: PORTABLE CHEST 1 VIEW COMPARISON:  Multiple prior chest x-rays, most recently dated yesterday. FINDINGS: Unchanged cardiomegaly. Degree of vascular congestion is similar to prior study. Bibasilar opacities and small right greater than left pleural effusions are unchanged. No pneumothorax. IMPRESSION: Stable vascular congestion and small right greater than left pleural effusions. Electronically Signed   By: Titus Dubin M.D.   On: 07/18/2017 11:32        Scheduled Meds: . aspirin EC  81 mg Oral Daily  . budesonide (PULMICORT) nebulizer solution  0.5 mg Nebulization BID  . digoxin  0.125 mg Oral Daily  . diltiazem  30 mg Oral Q6H  . fluticasone  2 spray Each Nare Daily  . furosemide  40 mg Intravenous Daily  . guaiFENesin  1,200 mg Oral BID  . heparin subcutaneous  5,000 Units Subcutaneous Q8H  . insulin aspart  0-15 Units Subcutaneous TID WC  . insulin aspart  0-5 Units Subcutaneous QHS  . insulin glargine  10 Units Subcutaneous BID  . levalbuterol  0.63 mg Nebulization TID  . loratadine  10 mg Oral Daily  . metoprolol tartrate  12.5 mg Oral BID  . pantoprazole  40 mg Oral Daily  . pravastatin  40 mg Oral q1800  . predniSONE  40 mg Oral Q breakfast  . sildenafil  20 mg Oral TID   Continuous Infusions: . cefTRIAXone (ROCEPHIN)  IV Stopped (07/19/17 5909)  . diltiazem (CARDIZEM) infusion Stopped (07/20/17 0744)  . sodium chloride       LOS: 7 days    Time spent: 40 mins    Rexene Alberts, MD [facility] Pager [phone]  If 7PM-7AM, please contact night-coverage [email or web address] Password [email or web address] 07/20/2017, 9:23 AM

## 2017-07-21 ENCOUNTER — Encounter (HOSPITAL_COMMUNITY): Payer: Self-pay | Admitting: Internal Medicine

## 2017-07-21 DIAGNOSIS — I5033 Acute on chronic diastolic (congestive) heart failure: Secondary | ICD-10-CM | POA: Diagnosis present

## 2017-07-21 DIAGNOSIS — I50812 Chronic right heart failure: Secondary | ICD-10-CM

## 2017-07-21 HISTORY — DX: Chronic right heart failure: I50.812

## 2017-07-21 LAB — GLUCOSE, CAPILLARY
GLUCOSE-CAPILLARY: 119 mg/dL — AB (ref 65–99)
Glucose-Capillary: 247 mg/dL — ABNORMAL HIGH (ref 65–99)

## 2017-07-21 LAB — BASIC METABOLIC PANEL
Anion gap: 8 (ref 5–15)
BUN: 36 mg/dL — ABNORMAL HIGH (ref 6–20)
CHLORIDE: 98 mmol/L — AB (ref 101–111)
CO2: 29 mmol/L (ref 22–32)
Calcium: 9.6 mg/dL (ref 8.9–10.3)
Creatinine, Ser: 1.47 mg/dL — ABNORMAL HIGH (ref 0.61–1.24)
GFR calc non Af Amer: 45 mL/min — ABNORMAL LOW (ref >60)
GFR, EST AFRICAN AMERICAN: 52 mL/min — AB (ref >60)
Glucose, Bld: 133 mg/dL — ABNORMAL HIGH (ref 65–99)
POTASSIUM: 4.9 mmol/L (ref 3.5–5.1)
SODIUM: 135 mmol/L (ref 135–145)

## 2017-07-21 LAB — CBC
HCT: 45.6 % (ref 39.0–52.0)
Hemoglobin: 14.3 g/dL (ref 13.0–17.0)
MCH: 28.7 pg (ref 26.0–34.0)
MCHC: 31.4 g/dL (ref 30.0–36.0)
MCV: 91.6 fL (ref 78.0–100.0)
PLATELETS: 173 10*3/uL (ref 150–400)
RBC: 4.98 MIL/uL (ref 4.22–5.81)
RDW: 16.9 % — AB (ref 11.5–15.5)
WBC: 13.5 10*3/uL — AB (ref 4.0–10.5)

## 2017-07-21 MED ORDER — LEVALBUTEROL HCL 0.63 MG/3ML IN NEBU: 0.6300 mg | INHALATION_SOLUTION | RESPIRATORY_TRACT | 2 refills | Status: DC | PRN

## 2017-07-21 MED ORDER — LISINOPRIL 5 MG PO TABS: 5.0000 mg | ORAL_TABLET | Freq: Every day | ORAL | 2 refills | Status: DC

## 2017-07-21 MED ORDER — BUDESONIDE-FORMOTEROL FUMARATE 160-4.5 MCG/ACT IN AERO: 1.0000 | INHALATION_SPRAY | Freq: Two times a day (BID) | RESPIRATORY_TRACT | 12 refills | Status: AC

## 2017-07-21 MED ORDER — LISINOPRIL 5 MG PO TABS
5.0000 mg | ORAL_TABLET | Freq: Every day | ORAL | 11 refills | Status: DC
Start: 2017-07-21 — End: 2017-07-21

## 2017-07-21 MED ORDER — GLIPIZIDE 5 MG PO TABS: 5.0000 mg | ORAL_TABLET | Freq: Two times a day (BID) | ORAL | Status: DC

## 2017-07-21 MED ORDER — DILTIAZEM HCL ER COATED BEADS 120 MG PO CP24: 120.0000 mg | ORAL_CAPSULE | Freq: Every day | ORAL | 11 refills | Status: DC

## 2017-07-21 MED ORDER — BUDESONIDE-FORMOTEROL FUMARATE 160-4.5 MCG/ACT IN AERO: 2.0000 | INHALATION_SPRAY | Freq: Two times a day (BID) | RESPIRATORY_TRACT | 12 refills | Status: DC | PRN

## 2017-07-21 MED ORDER — METFORMIN HCL 500 MG PO TABS
500.0000 mg | ORAL_TABLET | Freq: Two times a day (BID) | ORAL | Status: DC
Start: 2017-07-21 — End: 2017-07-21
  Administered 2017-07-21: 500 mg via ORAL
  Filled 2017-07-21: qty 1

## 2017-07-21 MED ORDER — SILDENAFIL CITRATE 20 MG PO TABS: 20.0000 mg | ORAL_TABLET | Freq: Three times a day (TID) | ORAL | 1 refills | Status: DC

## 2017-07-21 NOTE — Progress Notes (Signed)
Subjective: He says he feels about the same. He is still short of breath. Still using nasal oxygen.  Objective: Vital signs in last 24 hours: Temp:  [98 F (36.7 C)-98.2 F (36.8 C)] 98.1 F (36.7 C) (08/11 0436) Pulse Rate:  [53-65] 53 (08/11 0436) Resp:  [18-25] 18 (08/11 0436) BP: (104-144)/(55-86) 104/68 (08/11 0436) SpO2:  [84 %-98 %] 96 % (08/11 0844) Weight:  [92.1 kg (203 lb)] 92.1 kg (203 lb) (08/11 0436) Weight change: -1.62 kg (-3 lb 9.1 oz) Last BM Date: 07/20/17  Intake/Output from previous day: 08/10 0701 - 08/11 0700 In: 960 [P.O.:960] Out: 550 [Urine:550]  PHYSICAL EXAM General appearance: alert, cooperative and mild distress Resp: He has more rales in the bases today than I heard earlier Cardio: irregularly irregular rhythm GI: soft, non-tender; bowel sounds normal; no masses,  no organomegaly Extremities: venous stasis dermatitis noted and Still some dependent edema Skin warm and dry  Lab Results:  Results for orders placed or performed during the hospital encounter of 07/13/17 (from the past 48 hour(s))  Glucose, capillary     Status: Abnormal   Collection Time: 07/19/17 11:31 AM  Result Value Ref Range   Glucose-Capillary 351 (H) 65 - 99 mg/dL  Glucose, capillary     Status: Abnormal   Collection Time: 07/19/17  4:24 PM  Result Value Ref Range   Glucose-Capillary 232 (H) 65 - 99 mg/dL  Glucose, capillary     Status: Abnormal   Collection Time: 07/19/17  9:19 PM  Result Value Ref Range   Glucose-Capillary 265 (H) 65 - 99 mg/dL   Comment 1 Notify RN   Glucose, capillary     Status: Abnormal   Collection Time: 07/20/17  7:40 AM  Result Value Ref Range   Glucose-Capillary 250 (H) 65 - 99 mg/dL   Comment 1 Notify RN    Comment 2 Document in Chart   Basic metabolic panel     Status: Abnormal   Collection Time: 07/20/17  9:40 AM  Result Value Ref Range   Sodium 132 (L) 135 - 145 mmol/L   Potassium 4.9 3.5 - 5.1 mmol/L   Chloride 97 (L) 101 - 111  mmol/L   CO2 27 22 - 32 mmol/L   Glucose, Bld 316 (H) 65 - 99 mg/dL   BUN 38 (H) 6 - 20 mg/dL   Creatinine, Ser 1.59 (H) 0.61 - 1.24 mg/dL   Calcium 9.8 8.9 - 10.3 mg/dL   GFR calc non Af Amer 41 (L) >60 mL/min   GFR calc Af Amer 47 (L) >60 mL/min    Comment: (NOTE) The eGFR has been calculated using the CKD EPI equation. This calculation has not been validated in all clinical situations. eGFR's persistently <60 mL/min signify possible Chronic Kidney Disease.    Anion gap 8 5 - 15  Glucose, capillary     Status: Abnormal   Collection Time: 07/20/17 11:32 AM  Result Value Ref Range   Glucose-Capillary 330 (H) 65 - 99 mg/dL   Comment 1 Notify RN    Comment 2 Document in Chart   Glucose, capillary     Status: Abnormal   Collection Time: 07/20/17  9:23 PM  Result Value Ref Range   Glucose-Capillary 196 (H) 65 - 99 mg/dL   Comment 1 Notify RN    Comment 2 Document in Chart   Basic metabolic panel     Status: Abnormal   Collection Time: 07/21/17  5:46 AM  Result Value Ref Range  Sodium 135 135 - 145 mmol/L   Potassium 4.9 3.5 - 5.1 mmol/L   Chloride 98 (L) 101 - 111 mmol/L   CO2 29 22 - 32 mmol/L   Glucose, Bld 133 (H) 65 - 99 mg/dL   BUN 36 (H) 6 - 20 mg/dL   Creatinine, Ser 1.47 (H) 0.61 - 1.24 mg/dL   Calcium 9.6 8.9 - 10.3 mg/dL   GFR calc non Af Amer 45 (L) >60 mL/min   GFR calc Af Amer 52 (L) >60 mL/min    Comment: (NOTE) The eGFR has been calculated using the CKD EPI equation. This calculation has not been validated in all clinical situations. eGFR's persistently <60 mL/min signify possible Chronic Kidney Disease.    Anion gap 8 5 - 15  CBC     Status: Abnormal   Collection Time: 07/21/17  5:46 AM  Result Value Ref Range   WBC 13.5 (H) 4.0 - 10.5 K/uL   RBC 4.98 4.22 - 5.81 MIL/uL   Hemoglobin 14.3 13.0 - 17.0 g/dL   HCT 45.6 39.0 - 52.0 %   MCV 91.6 78.0 - 100.0 fL   MCH 28.7 26.0 - 34.0 pg   MCHC 31.4 30.0 - 36.0 g/dL   RDW 16.9 (H) 11.5 - 15.5 %    Platelets 173 150 - 400 K/uL  Glucose, capillary     Status: Abnormal   Collection Time: 07/21/17  7:24 AM  Result Value Ref Range   Glucose-Capillary 119 (H) 65 - 99 mg/dL   Comment 1 Notify RN    Comment 2 Document in Chart     ABGS No results for input(s): PHART, PO2ART, TCO2, HCO3 in the last 72 hours.  Invalid input(s): PCO2 CULTURES Recent Results (from the past 240 hour(s))  Blood Culture (routine x 2)     Status: None   Collection Time: 07/13/17  9:00 PM  Result Value Ref Range Status   Specimen Description BLOOD RIGHT FOREARM  Final   Special Requests   Final    Blood Culture results may not be optimal due to an inadequate volume of blood received in culture bottles   Culture NO GROWTH 5 DAYS  Final   Report Status 07/18/2017 FINAL  Final  Blood Culture (routine x 2)     Status: None   Collection Time: 07/13/17  9:00 PM  Result Value Ref Range Status   Specimen Description BLOOD LEFT FOREARM  Final   Special Requests   Final    Blood Culture results may not be optimal due to an inadequate volume of blood received in culture bottles   Culture NO GROWTH 5 DAYS  Final   Report Status 07/18/2017 FINAL  Final  MRSA PCR Screening     Status: None   Collection Time: 07/14/17 12:46 AM  Result Value Ref Range Status   MRSA by PCR NEGATIVE NEGATIVE Final    Comment:        The GeneXpert MRSA Assay (FDA approved for NASAL specimens only), is one component of a comprehensive MRSA colonization surveillance program. It is not intended to diagnose MRSA infection nor to guide or monitor treatment for MRSA infections.   Culture, Urine     Status: None   Collection Time: 07/15/17  2:15 PM  Result Value Ref Range Status   Specimen Description URINE, CLEAN CATCH  Final   Special Requests NONE  Final   Culture   Final    NO GROWTH Performed at [facility], [address] [place], [place] [address]    Report Status 07/17/2017 FINAL  Final   Studies/Results: Dg  Chest 1 View  Addendum Date: 07/20/2017   ADDENDUM REPORT: 07/20/2017 10:38 ADDENDUM: Emphysema (ICD10-J43.9). The CPT code for aortic atherosclerosis was entered in error. Electronically Signed   By: Lowella Grip III M.D.   On: 07/20/2017 10:38   Result Date: 07/20/2017 CLINICAL DATA:  Asthma.  Atrial fibrillation.  Hypertension. EXAM: CHEST 1 VIEW COMPARISON:  July 18, 2017 FINDINGS: Small pleural effusions remain. There is an ill-defined area of hazy opacity in the lateral right mid lung which likely represents a degree of atelectatic change. A nodular opacity in the left perihilar region is better seen on CT but is seen on this examination measuring 1 x 1 cm. Areas of hyperaeration consistent with underlying emphysematous change are noted, better seen on recent CT. There is cardiomegaly with pulmonary venous hypertension. No appreciable adenopathy.  No bone lesions. IMPRESSION: Persistent pulmonary vascular congestion with small pleural effusions. Atelectasis right mid lung. Nodular opacity measuring approximately 1 x 1 cm in the right perihilar region, much better seen on recent CT. There is a degree of underlying emphysema, better appreciated by CT. No frank edema or consolidation evident. Aortic Atherosclerosis (ICD10-I70.0). Electronically Signed: By: Lowella Grip III M.D. On: 07/20/2017 10:35    Medications:  Prior to Admission:  Prescriptions Prior to Admission  Medication Sig Dispense Refill Last Dose  . albuterol (PROAIR HFA) 108 (90 Base) MCG/ACT inhaler Inhale 1-2 puffs into the lungs every 6 (six) hours as needed for wheezing or shortness of breath.   Past Week at Unknown time  . albuterol (PROVENTIL) (2.5 MG/3ML) 0.083% nebulizer solution Take 3 mLs (2.5 mg total) by nebulization every 6 (six) hours as needed for wheezing or shortness of breath. 75 mL 12 Past Week at Unknown time  . aspirin EC 81 MG tablet Take 81 mg by mouth daily.   07/13/2017 at Unknown time  .  budesonide-formoterol (SYMBICORT) 160-4.5 MCG/ACT inhaler Inhale 2 puffs into the lungs 2 (two) times daily as needed (for shortness of breath).    Past Week at Unknown time  . furosemide (LASIX) 40 MG tablet Take 1 tablet (40 mg total) by mouth 2 (two) times daily. 60 tablet 5 07/13/2017 at Unknown time  . glipiZIDE (GLUCOTROL) 5 MG tablet Take 5 mg by mouth 2 (two) times daily.   07/13/2017 at Unknown time  . lisinopril (PRINIVIL,ZESTRIL) 40 MG tablet Take 40 mg by mouth daily.   07/13/2017 at Unknown time  . lovastatin (MEVACOR) 40 MG tablet Take 40 mg by mouth at bedtime. Pt should take with food.    07/12/2017 at Unknown time  . metFORMIN (GLUCOPHAGE) 500 MG tablet Take 500 mg by mouth 2 (two) times daily with a meal.   07/13/2017 at Unknown time  . metoprolol tartrate (LOPRESSOR) 25 MG tablet Take 12.5 mg by mouth 2 (two) times daily.    07/13/2017 at 730a  . omeprazole (PRILOSEC) 40 MG capsule Take 40 mg by mouth daily.   07/13/2017 at Unknown time  . polyethylene glycol (MIRALAX) packet Take 17 g by mouth daily as needed for moderate constipation. 30 each 0 unknown  . nitroGLYCERIN (NITROSTAT) 0.4 MG SL tablet Place 0.4 mg under the tongue every 5 (five) minutes x 3 doses as needed for chest pain. If no relief call 911 or go to emergency room.   unknown   Scheduled: . aspirin EC  81 mg Oral  Daily  . budesonide (PULMICORT) nebulizer solution  0.5 mg Nebulization BID  . diltiazem  30 mg Oral Q6H  . fluticasone  2 spray Each Nare Daily  . guaiFENesin  1,200 mg Oral BID  . heparin subcutaneous  5,000 Units Subcutaneous Q8H  . insulin aspart  0-15 Units Subcutaneous TID WC  . insulin aspart  0-5 Units Subcutaneous QHS  . insulin glargine  10 Units Subcutaneous BID  . levalbuterol  0.63 mg Nebulization TID  . loratadine  10 mg Oral Daily  . metFORMIN  500 mg Oral BID WC  . metoprolol tartrate  12.5 mg Oral BID  . pantoprazole  40 mg Oral Daily  . pravastatin  40 mg Oral q1800  . predniSONE  40 mg Oral  Q breakfast  . sildenafil  20 mg Oral TID   Continuous: . cefTRIAXone (ROCEPHIN)  IV Stopped (07/20/17 1140)  . diltiazem (CARDIZEM) infusion Stopped (07/20/17 0744)  . sodium chloride     JIJ:[id number], LORazepam, nitroGLYCERIN, polyethylene glycol  Assesment: He was admitted with acute hypoxic respiratory failure. He was septic on admission. He has COPD and acute exacerbation. He has acute on chronic heart failure and he has significant right ventricular dilation with pulmonary hypertension. He seems to have more rales today. Difficult situation with right heart failure making it difficult to diurese without causing increasing problems with hypotension and/or renal dysfunction. Principal Problem:   Acute respiratory failure with hypoxia (HCC) Active Problems:   Type 2 diabetes mellitus (HCC)   HLD (hyperlipidemia)   GERD (gastroesophageal reflux disease)   Essential hypertension   Chronic pulmonary embolism (HCC)   COPD exacerbation (HCC)   Sepsis due to undetermined organism with acute respiratory failure (HCC)   Ascending aortic aneurysm (HCC)   Elevated bilirubin   Left nephrolithiasis   Lactic acidosis   Moderate to severe pulmonary hypertension (HCC)--per 2-D echo 07/14/2017   Mass of lung   Acute lower UTI   Sepsis ([place])   Chronic atrial fibrillation ([place])    Plan: Continue current treatments    LOS: 8 days   Aryonna Gunnerson L 07/21/2017, 9:55 AM

## 2017-07-21 NOTE — Progress Notes (Signed)
Patient discharging home with wife.  IV removed - WNL.  Reviewed DC instructions and medications.  Advised to follow up with PCP, heart, and lung MDs.  Emphasized importance of daily weights, low salt diet, and taking medications as prescribed to prevent future hospitalizations.  No questions at this time.  Hand outs given.  Verbalizes understanding.  Patient in NAD at this time, assisted off unit via WC.

## 2017-07-21 NOTE — Discharge Summary (Signed)
Physician Discharge Summary  Austin [name] HWE:[id number] DOB: 07-10-1941 DOA: 07/13/2017  PCP: [facility] date: 07/13/2017 Discharge date: 07/21/2017  Time spent: Greater than 30 minutes  Recommendations for Outpatient Follow-up:  1. Recommend referral to pulmonary hypertension clinic. 2. Recommend yearly surveillance of ascending aortic aneurysm. 3. Home health physical therapy was ordered. 4. Patient was advised to follow-up with his PCP, pulmonologist, and cardiologist in the next week or 2.    Discharge Diagnoses:  1. Acute on chronic respiratory failure with hypoxia. 2. Acute respiratory failure with hypoxia secondary to acute on chronic diastolic heart failure. 3. Moderate to severe pulmonary hypertension measuring 81 mmHg, per 2-D echo 07/14/2017. 4. Severe right heart systolic dysfunction. 5. Oxygen dependent COPD/emphysema with exacerbation. 6. Chronic atrial fibrillation, not on anticoagulation secondary to history of severe bleeding. 7. Chronic pulmonary embolism, status post IVC filter in the past, since removed. 8. Essential hypertension. 9. Acute lower UTI. 10. Sepsis with lactic acidosis due to undetermined organism. 11. Ascending aortic aneurysm measuring 4.1 cm. 12. Stable right lung mass. 13. Nephrolithiasis. 14. Essential hypertension. 15. Type 2 diabetes mellitus. 16. Hyperlipidemia.   Discharge Condition: Chronically debilitated but improved.  Diet recommendation: Heart healthy/carbohydrate modified.  Filed Weights   07/19/17 0500 07/20/17 0600 07/21/17 0436  Weight: 92.8 kg (204 lb 9.4 oz) 93.7 kg (206 lb 9.1 oz) 92.1 kg (203 lb)    History of present illness:  Patient is a4 year old man with history of COPD, diastolic heart failure and chronic atrial fibrillation who presented on 07/13/17 with a several day history of progressivel and worsening shortness of breath. On the day of admission, patient had been outside  to feed his dog, but slipped in the mud and was unable to get up by himself or with his wife's assistance. EMS was called and patient was reportedly hypoxic with decreased level of consciousness.  In the ED, initial vital signs were rectal temperature 97.64F, HR 86, BP 134/82 mmHg, respirations 28 and O2 sat 96% on BPAP. He was started on BiPAP ventilation, given 3000 mL of normal saline bolus, Solu-Medrol 125 mg IVP 1 dose, given bronchodilators, vancomycin and Zosyn. Labs revealed a WBC of 21.2 with 89% neutrophils, hemoglobin of 15.4 g/dL  and platelets 159. Sodium was 137, potassium 4.8, chloride 103, bicarbonate 16 and initial lactic acid 7.5 mmol/L. BUN was 33, creatinine 1.9, glucose 130 and bilirubin 2.1 mg/dL. His AST was mildly elevated at 46. BNP was 1769 pg/mL. EKG was sinus rhythm with without ischemic changes, but significant for RBBB, which has been present before. He was admitted for further evaluation and management.  Hospital Course:  #1 Acute respiratory failure with hypoxia secondary to acute diastolic heart failure. Patient presented with acute respiratory failure with hypoxia initially requiring BiPAP. Chest x-ray on 8/5 revealed pleural effusions mildly increased from 2 days prior, along with emphysema and likely pulmonary hypertension. -He was initially started on IV fluids. Due to liable blood pressures, diuretics were initially held. -Subsequently, IV Lasix was started which was titrated up to 40 mg IV every 12 hours. -Due to patient's prior history of PE and DVT, VQ scan ordered for evaluation and revealed no acute PE. Lower extremity Dopplers were negative for DVT. -2-D echocardiogram revealed grade 1 diastolic dysfunction, an EF of 65-70%, dynamic obstruction at rest and mid cavity with severely dilated right ventricular cavity size, systolic function moderately to severely reduced on the right which is worse than previous 2-D echo and  worsening pulmonary artery systolic  pressure with a PA peak pressure of 81 mmHg. - ABG had a pH of 7.38, PCO2 of 36, PO2 of 82, bicarbonate of 22.  -I/O's recorded were recorded as positive, but there was a significant decrease of peripheral edema on exam. BNP decreased to 995 from 1769 on admission. Chest x-ray on 8/10 revealed vascular congestion and small pleural effusions, but no frank edema -With clinical and symptomatic improvement, Lasix was changed back to by mouth.  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 VQ scan ordered for evaluation of hypoxia and it was negative for PE. 2-D echo revealed severe pulmonary hypertension with 81 mmHg and severely reduced right heart systolic function. -Pulmonologist, Dr. Luan Pulling was consulted. He started the patient on sildenafil. -Patient would likely benefit from a referral to the pulmonary hypertension clinic. This will be deferred to his PCP, pulmonologist, or cardiologist.  Sepsis due to undetermined organism secondary to UTI On admission patient met the criteria for sepsis with a lactic acidosis of 7.5  Patient also noted to have a leukocytosis with a white count of 21.2. -Patient was started on IV fluids and broad-spectrum antibiotics vancomycin and Zosyn. -Blood cultures and a urine culture were ordered and both were negative to date. -Lactic acid trended down. -White blood cell count normalized. -MRSA PCR was negative, so vancomycin was discontinued. -Given negative cultures, antibiotic therapy was narrowed to Rocephin. -He completed a eight-day course in the hospital. Antibiotics were discontinued at the time of discharge. -Of note, the mild increase in his WBC was thought to be steroid-induced.  COPD exacerbation causing chronic respiratory failure with hypoxia Patient reported that he is on home oxygen 2 L/m. -Due to chest tightness and congestion noted, Solu-Medrol, Pulmicort, Claritin, Flonase, Mucinex. -Duo nebs were  discontinued due to A. fib with RVR. He was placed on Xopenex and Atrovent nebulizers. Atrovent nebulizer was eventually discontinued. -Solu-Medrol was titrated down and then discontinued. Prednisone was started and tapered accordingly. It was discontinued at the time of discharge. -Patient was discharged and given a prescription for Symbicort and Xopenex. He was instructed to discontinue albuterol nebulizer due to the RVR.  Prior history of PE and DVT Patient was not on anticoagulation prior to admission. Patient stated that he did not follow-up with one of his doctors and thus the reason why he was not on anticoagulation at this time. Due to patient's respiratory status and presentation and concern for DVT and PE  VQ scan was ordered and it was negative for pulmonary emboli. Lower extremity Dopplers were also negative for DVT. IV heparin had been started but was discontinued. -It had been subsequently reported by the family that patient had a prior history of GI bleed while on anticoagulation.  Chronic atrial fibrillation with RVR CHA2DS2Vasc score 6 Patient is treated chronically with metoprolol for rate control. Apparently, anticoagulation was discontinued due to a previous significant GI bleed. -Patient developed RVR with a heart rate in the 140s. -Cardizem drip was started. Metoprolol was continued. -Oral diltiazem was started at 30 mg every 6 hours due to soft blood pressures. Digoxin IV was given 1. -Daily digoxin was started.  -Subsequently, the patient's heart rate trended toward bradycardic. -At the time of discharge, digoxin was discontinued. He was discharged on Cardizem 120 mg daily and continued on metoprolol 12.5 mg twice a day.   Diabetes mellitus type 2 Patient is treated chronically with glipizide and metformin. They were withheld. -Patient was started on sliding scale NovoLog  and Lantus due to IV steroid treatment. -Insulin was discontinued at the time of discharge. He was  instructed to restart glipizide and metformin at home.  -His hemoglobin A1c was 7.9.  Hypertension. Patient was treated chronically with lisinopril and metoprolol. Only metoprolol was restarted. -Diltiazem was added due treat persistent RVR. -With the addition of sildenafil and diltiazem, his blood pressures were consistently on the low end of normal range. Therefore, the dose of lisinopril was decreased to 5 mg daily at the time of discharge.  Chronic kidney disease stage II-III Patient's creatinine was 1.65 Jan./2018 and 1.29 Nov./2016. It was 1.9 on admission. -He was started on IV fluids. -IV fluids were discontinued due to acute diastolic heart failure/right heart failure exacerbation. -IV Lasix was started.  -His creatinine has improved to 1.31>> 1.44>>1.47, better than baseline.  Incidental aortic ascending aneurysm  Noted on CT. Outpatient follow-up is recommended.  Stable spiculated masses right middle lobe and right upper lobe This mass was noted on the CT scan of the chest without contrast on 07/13/2017. Per radiology, the mass was stable and given the long-term stability, benign process was likely favored. Patient will need ongoing outpatient follow-up with his pulmonologist in [place].  Thrombocytopenia. Patient's platelet count was within normal limits, but has decreased to 146. - Heparin was started and stopped and Lovenox was started for dvt prophylaxis instead. His platelet count improved.    Procedures:  Chest x-ray 07/13/2017  Plain films of the right tib-fib 07/13/2017  CT abdomen and pelvis 07/13/2017  CT chest without contrast 07/13/2017  2-D echo 07/14/2017--- EF 65-70%, dynamic obstruction at rest and mid cavity, grade 1 diastolic dysfunction, severely dilated right ventricular cavity size, systolic function moderately to severely reduced, right atrium severely dilated, pulmonary artery systolic pressure severely increased PA peak pressure of 81  mmHg.  VQ scan 07/15/2017  Lower Extremity Dopplers 07/15/2017  Consultations:  Pulmonology  Discharge Exam: Vitals:   07/21/17 1110 07/21/17 1359  BP: 125/81   Pulse: 67   Resp:    Temp:    SpO2:  93%  Respiratory rate 18. Temperature 98.1.  General exam: Pleasant alert 76 year old man who looks better, but is chronically debilitated.  Respiratory system: Rare crackles, overall less less. Breathing nonlabored.  Cardiovascular system: Irregularly irregular. Bilateral lower extremity edema down to trace. Gastrointestinal system: Abdomen is nondistended, soft and nontender. No organomegaly or masses felt. Normal bowel sounds heard. Central nervous system: Alert and oriented. No focal neurological deficits. Extremities: No acute hot red joints.  Psychiatry: Judgement and insight appear normal. Mood & affect appropriate.   Discharge Instructions   Discharge Instructions    Diet - low sodium heart healthy    Complete by:  As directed    Diet Carb Modified    Complete by:  As directed    Discharge instructions    Complete by:  As directed    Follow-up with your primary care physician, lung doctor, and heart doctor within the next week or 2. Take medications as prescribed. Take your blood pressure daily. If the top number is below 100, call your primary care doctor or your heart doctor for further instructions.   Increase activity slowly    Complete by:  As directed      Current Discharge Medication List    START taking these medications   Details  diltiazem (CARDIZEM CD) 120 MG 24 hr capsule Take 1 capsule (120 mg total) by mouth daily. NEW MEDICATION FOR YOUR HEART Qty: 30 capsule,  Refills: 11    levalbuterol (XOPENEX) 0.63 MG/3ML nebulizer solution Take 3 mLs (0.63 mg total) by nebulization every 4 (four) hours as needed for wheezing or shortness of breath. Qty: 3 mL, Refills: 2    sildenafil (REVATIO) 20 MG tablet Take 1 tablet (20 mg total) by mouth 3 (three) times  daily. Qty: 90 tablet, Refills: 1      CONTINUE these medications which have CHANGED   Details  budesonide-formoterol (SYMBICORT) 160-4.5 MCG/ACT inhaler Inhale 1 puff into the lungs 2 (two) times daily. Qty: 1 Inhaler, Refills: 12    glipiZIDE (GLUCOTROL) 5 MG tablet Take 1 tablet (5 mg total) by mouth 2 (two) times daily. DO NOT TAKE THIS DIABETES MEDICINE IF YOUR BLOOD SUGAR IS LESS THAN 130.    lisinopril (PRINIVIL,ZESTRIL) 5 MG tablet Take 1 tablet (5 mg total) by mouth daily. Qty: 30 tablet, Refills: 2      CONTINUE these medications which have NOT CHANGED   Details  albuterol (PROAIR HFA) 108 (90 Base) MCG/ACT inhaler Inhale 1-2 puffs into the lungs every 6 (six) hours as needed for wheezing or shortness of breath.    aspirin EC 81 MG tablet Take 81 mg by mouth daily.    furosemide (LASIX) 40 MG tablet Take 1 tablet (40 mg total) by mouth 2 (two) times daily. Qty: 60 tablet, Refills: 5    lovastatin (MEVACOR) 40 MG tablet Take 40 mg by mouth at bedtime. Pt should take with food.     metFORMIN (GLUCOPHAGE) 500 MG tablet Take 500 mg by mouth 2 (two) times daily with a meal.    metoprolol tartrate (LOPRESSOR) 25 MG tablet Take 12.5 mg by mouth 2 (two) times daily.     omeprazole (PRILOSEC) 40 MG capsule Take 40 mg by mouth daily.    polyethylene glycol (MIRALAX) packet Take 17 g by mouth daily as needed for moderate constipation. Qty: 30 each, Refills: 0    nitroGLYCERIN (NITROSTAT) 0.4 MG SL tablet Place 0.4 mg under the tongue every 5 (five) minutes x 3 doses as needed for chest pain. If no relief call 911 or go to emergency room.      STOP taking these medications     albuterol (PROVENTIL) (2.5 MG/3ML) 0.083% nebulizer solution        Allergies  Allergen Reactions  . Contrast Media [Iodinated Diagnostic Agents] Nausea And Vomiting  . Other Other (See Comments)    Cannot have "high doses of anesthesia because of his lungs"   Follow-up Information    The  [facility] Schedule an appointment as soon as possible for a visit in 1 week(s).   Contact information: [address] [place] [address] [phone]        Mertie Moores, MD. Schedule an appointment as soon as possible for a visit in 2 week(s).   Specialty:  Specialist Contact information: [address] [facility] [place] [facility] - PULMONOLOGY [place] [address] [phone]            The results of significant diagnostics from this hospitalization (including imaging, microbiology, ancillary and laboratory) are listed below for reference.    Significant Diagnostic Studies: Ct Abdomen Pelvis Wo Contrast  Result Date: 07/13/2017 CLINICAL DATA:  Abnormal finding on chest CT prompting recommendation for abdominal CT. Small amount of fluid around the spleen. Recently admitted with sepsis, on antibiotics. EXAM: CT ABDOMEN AND PELVIS WITHOUT CONTRAST TECHNIQUE: Multidetector CT imaging of the abdomen and pelvis was performed following  the standard protocol without IV contrast. COMPARISON:  Chest CT from earlier today. CT abdomen dated 04/11/2015. FINDINGS: Lower chest: Bibasilar consolidations, as also described on chest CT from earlier today, most likely atelectasis. Hepatobiliary: No focal liver abnormality is seen. Status post cholecystectomy. No biliary dilatation. Pancreas: Unremarkable. No pancreatic ductal dilatation or surrounding inflammatory changes. Spleen: Normal in size without focal abnormality. Adrenals/Urinary Tract: Bulbous left adrenal gland appears stable. Right adrenal gland is unremarkable. Multiple left renal stones, largest measuring 1.5 cm. Additional 15 mm stone within the proximal left ureter with associated mild hydronephrosis. No right renal stone or hydronephrosis. Bladder is unremarkable, partially decompressed. Stomach/Bowel: Bowel is normal in caliber. Left inguinal hernia contains a portion of the left colon but there is no  associated obstruction or inflammation at the hernia site. Moderate amount of stool throughout the nondistended colon. No bowel wall thickening or evidence of bowel wall inflammation seen. Vascular/Lymphatic: Aortic atherosclerosis. No enlarged abdominal or pelvic lymph nodes. Reproductive: Prostate gland is mildly prominent in size causing slight mass effect on the bladder base. Other: Small amount of free fluid in the left upper quadrant adjacent to the spleen, simple fluid density. Additional trace free fluid/fluid stranding in the central abdomen. No evidence of active hemorrhage. No evidence of abscess collection. No free intraperitoneal air. Musculoskeletal: Degenerative changes in the lower lumbar spine, mild to moderate in degree. No acute or suspicious osseous finding. IMPRESSION: 1. Small amount of free fluid in the left upper quadrant, adjacent to the spleen, simple fluid density consistent with simple ascites. No evidence of acute hemorrhage in the abdomen or pelvis. Adjacent spleen is unremarkable, although characterization limited by the lack of IV contrast. 2. 15 mm stone within the proximal left ureter, just distal to the left UPJ, causing mild hydronephrosis. 3. Left nephrolithiasis. 4. Left inguinal hernia which contains a loop of the left colon, without associated bowel obstruction or inflammation. 5. Aortic atherosclerosis. 6. Bibasilar consolidations, likely atelectasis. Electronically Signed   By: Franki Cabot M.D.   On: 07/13/2017 22:33   Dg Chest 1 View  Addendum Date: 07/20/2017   ADDENDUM REPORT: 07/20/2017 10:38 ADDENDUM: Emphysema (ICD10-J43.9). The CPT code for aortic atherosclerosis was entered in error. Electronically Signed   By: Lowella Grip III M.D.   On: 07/20/2017 10:38   Result Date: 07/20/2017 CLINICAL DATA:  Asthma.  Atrial fibrillation.  Hypertension. EXAM: CHEST 1 VIEW COMPARISON:  July 18, 2017 FINDINGS: Small pleural effusions remain. There is an ill-defined  area of hazy opacity in the lateral right mid lung which likely represents a degree of atelectatic change. A nodular opacity in the left perihilar region is better seen on CT but is seen on this examination measuring 1 x 1 cm. Areas of hyperaeration consistent with underlying emphysematous change are noted, better seen on recent CT. There is cardiomegaly with pulmonary venous hypertension. No appreciable adenopathy.  No bone lesions. IMPRESSION: Persistent pulmonary vascular congestion with small pleural effusions. Atelectasis right mid lung. Nodular opacity measuring approximately 1 x 1 cm in the right perihilar region, much better seen on recent CT. There is a degree of underlying emphysema, better appreciated by CT. No frank edema or consolidation evident. Aortic Atherosclerosis (ICD10-I70.0). Electronically Signed: By: Lowella Grip III M.D. On: 07/20/2017 10:35   Dg Tibia/fibula Right  Result Date: 07/13/2017 CLINICAL DATA:  Chronic wound to mid tib/fib. Pt fell while outside today. Almost unresponsive when brought to ER. Unable to elaborate on pain or would history. Diabetic. EXAM: RIGHT  TIBIA AND FIBULA - 2 VIEW COMPARISON:  None. FINDINGS: No osseous fracture or dislocation seen. No acute or suspicious osseous lesion. No destructive change to suggest osteomyelitis. Bandages overlying the mid tib-fib, with questionable underlying soft tissue edema. Extensive atherosclerotic calcifications. IMPRESSION: 1. No osseous fracture or dislocation. No evidence of osteomyelitis seen. 2. Bandages overlying the mid tib-fib, presumably related to given history of chronic wound. Electronically Signed   By: Franki Cabot M.D.   On: 07/13/2017 20:42   Ct Chest Wo Contrast  Result Date: 07/13/2017 CLINICAL DATA:  Pt fell today while outside, also C/o difficulty breathing x2 days with productive cough. White thick sputum noted. Upon arrival EMS states he was "almost unresponsive" upon arrival, low O2 sats EXAM: CT  CHEST WITHOUT CONTRAST TECHNIQUE: Multidetector CT imaging of the chest was performed following the standard protocol without IV contrast. COMPARISON:  07/13/2017 chest x-ray FINDINGS: Cardiovascular: Heart size is prominent. Trace pericardial effusion. Extensive coronary artery calcifications. There is atherosclerotic calcification of the thoracic aorta. Ascending aorta is 4.1 cm. Aortic arch is 3.9 cm. Main pulmonary artery is prominent, 4.1 cm. Mediastinum/Nodes: The visualized portion of the thyroid gland has a normal appearance. No mediastinal, hilar, or axillary adenopathy. Esophagus is normal in appearance. Lungs/Pleura: There are extensive paraseptal and panlobular emphysematous changes throughout the lungs, primarily involving the lung apices. Within the right middle lobe there is a spiculated mass measuring 12 mm on image 103 of series 4. A spiculated mass measures 11 mm in the right upper lobe on image 60 of series 4, adjacent to an area of scarring. These nodules appear stable compared with prior exams including 2014. Trace bilateral pleural effusions. There is bibasilar dependent atelectasis versus consolidation or contusion, left greater than right. Upper Abdomen: Within the upper abdomen, a small amount of fluid is identified around the spleen, not further characterized. Consider further evaluation with CT of the abdomen and pelvis. Musculoskeletal: Degenerative changes are seen in thoracic spine. No acute vertebral fracture. Sternum is intact. IMPRESSION: 1. Fluid within the upper abdomen warranting further evaluation. Consider CT of the abdomen and pelvis with intravenous contrast unless it is contraindicated. 2. Stable spiculated masses in the right middle lobe and for right upper lobe, measuring 12 mm and 11 mm respectively. Given the long-term stability, benign process is favored. 3. Advanced emphysema.  Emphysema (ICD10-J43.9). 4. Bibasilar dependent atelectasis/consolidation, or contusion. 5.  Extensive coronary artery disease. 6.  Aortic Atherosclerosis (ICD10-I70.0). 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 Aortic aneurysm NOS (ICD10-I71.9). Recommend annual imaging followup by CTA or MRA. This recommendation follows 2010 ACCF/AHA/AATS/ACR/ASA/SCA/SCAI/SIR/STS/SVM Guidelines for the Diagnosis and Management of Patients with Thoracic Aortic Disease. Circulation. 2010; 121: [id number] 8. Enlarged pulmonary arteries compatible with pulmonary arterial hypertension. These results were called by telephone at the time of interpretation on 07/13/2017 at 9:17 pm to Dr. Fredia Sorrow , who verbally acknowledged these results. Electronically Signed   By: Nolon Nations M.D.   On: 07/13/2017 21:18   Nm Pulmonary Perf And Vent  Result Date: 07/15/2017 CLINICAL DATA:  Concern for pulmonary embolism. Short breath for 1 week. Renal insufficiency. EXAM: NUCLEAR MEDICINE VENTILATION - PERFUSION LUNG SCAN TECHNIQUE: Ventilation images were obtained in multiple projections using inhaled aerosol [id number]. Perfusion images were obtained in multiple projections after intravenous injection of Tc-918mAA. RADIOPHARMACEUTICALS:  Thirty mCi Technetium-9929mPA aerosol inhalation and 4 mCi Technetium-26m24m IV COMPARISON:  None. FINDINGS: Ventilation: Multiple peripheral ventilation defects. Decreased ventilation in the RIGHT lower lobe. Heterogeneous  ventilation MRI. Perfusion: There several perfusion defects which are match by ventilation defect. For example centrally in the RIGHT lower lobe away from the border which is matched by the ventilation defect. There is poor ventilation to the RIGHT lower lobe as well as poor perfusion which likely relates to effusion on comparison radiograph. There is no wedge-shaped peripheral perfusion defect which are unmatched to suggest acute pulmonary embolism IMPRESSION: 1. No wedge-shaped peripheral perfusion defect which are unmatched to suggest acute pulmonary embolism.  2. Multiple perfusion defects are matched by ventilation defects most consistent with COPD. 3. Decreased profusion and ventilation to the RIGHT lung base correlates with pleural effusion. Electronically Signed   By: Suzy Bouchard M.D.   On: 07/15/2017 13:31   US Venous Img Lower Bilateral  Result Date: 07/15/2017 CLINICAL DATA:  Lower extremity swelling. Hypoxia. Previous history of DVT and pulmonary embolism. EXAM: BILATERAL LOWER EXTREMITY VENOUS DOPPLER ULTRASOUND TECHNIQUE: Gray-scale sonography with graded compression, as well as color Doppler and duplex ultrasound were performed to evaluate the lower extremity deep venous systems from the level of the common femoral vein and including the common femoral, femoral, profunda femoral, popliteal and calf veins including the posterior tibial, peroneal and gastrocnemius veins when visible. The superficial great saphenous vein was also interrogated. Spectral Doppler was utilized to evaluate flow at rest and with distal augmentation maneuvers in the common femoral, femoral and popliteal veins. COMPARISON:  None. FINDINGS: RIGHT LOWER EXTREMITY Common Femoral Vein: No evidence of thrombus. Normal compressibility, respiratory phasicity and response to augmentation. Saphenofemoral Junction: No evidence of thrombus. Normal compressibility and flow on color Doppler imaging. Profunda Femoral Vein: No evidence of thrombus. Normal compressibility and flow on color Doppler imaging. Femoral Vein: No evidence of thrombus. Normal compressibility, respiratory phasicity and response to augmentation. Popliteal Vein: No evidence of thrombus. Normal compressibility, respiratory phasicity and response to augmentation. Calf Veins: No evidence of thrombus. Normal compressibility and flow on color Doppler imaging. Superficial Great Saphenous Vein: No evidence of thrombus. Normal compressibility and flow on color Doppler imaging. Venous Reflux:  None. Other Findings:  None. LEFT LOWER  EXTREMITY Common Femoral Vein: No evidence of thrombus. Normal compressibility, respiratory phasicity and response to augmentation. Saphenofemoral Junction: No evidence of thrombus. Normal compressibility and flow on color Doppler imaging. Profunda Femoral Vein: No evidence of thrombus. Normal compressibility and flow on color Doppler imaging. Femoral Vein: No evidence of thrombus. Normal compressibility, respiratory phasicity and response to augmentation. Popliteal Vein: No evidence of thrombus. Normal compressibility, respiratory phasicity and response to augmentation. Calf Veins: No evidence of thrombus. Normal compressibility and flow on color Doppler imaging. Superficial Great Saphenous Vein: No evidence of thrombus. Normal compressibility and flow on color Doppler imaging. Venous Reflux:  None. Other Findings:  None. IMPRESSION: No evidence of DVT within either lower extremity. Electronically Signed   By: Earle Gell M.D.   On: 07/15/2017 12:36   Dg Chest Port 1 View  Result Date: 07/18/2017 CLINICAL DATA:  CHF. EXAM: PORTABLE CHEST 1 VIEW COMPARISON:  Multiple prior chest x-rays, most recently dated yesterday. FINDINGS: Unchanged cardiomegaly. Degree of vascular congestion is similar to prior study. Bibasilar opacities and small right greater than left pleural effusions are unchanged. No pneumothorax. IMPRESSION: Stable vascular congestion and small right greater than left pleural effusions. Electronically Signed   By: Titus Dubin M.D.   On: 07/18/2017 11:32   Dg Chest Port 1 View  Result Date: 07/17/2017 CLINICAL DATA:  Difficulty breathing and productive cough for 1 week.  Multiple medical problems. EXAM: PORTABLE CHEST 1 VIEW COMPARISON:  07/15/2017 FINDINGS: The heart remains moderately enlarged. Vascular congestion is stable. Bibasilar opacity, likely a combination of pleural fluid and volume loss is stable on the left and increased on the right. No pneumothorax. IMPRESSION: Stable opacity at  the left base and increasing opacity at the right base. This findings likely are a combination of pleural fluid and volume loss. Stable vascular congestion. Electronically Signed   By: Marybelle Killings M.D.   On: 07/17/2017 07:55   Dg Chest Port 1 View  Result Date: 07/15/2017 CLINICAL DATA:  Hypoxia EXAM: PORTABLE CHEST 1 VIEW COMPARISON:  CT from 2 days ago FINDINGS: Chronic cardiopericardial enlargement. Chronic widening of upper mediastinal contours attributed to ectatic vessels based on recent CT. Small pleural effusions. Hyperinflation and emphysematous changes. No Kerley lines or air bronchogram. Chronic enlargement of pulmonary arteries. IMPRESSION: 1. Small pleural effusions that have mildly increased from 2 days ago. 2. Emphysema. 3. Chronic cardiomegaly. Large pulmonary arteries suggesting hypertension. Electronically Signed   By: Monte Fantasia M.D.   On: 07/15/2017 10:50   Dg Chest Port 1 View  Result Date: 07/13/2017 CLINICAL DATA:  76 y/o M; shortness of breath with fall. Difficulty breathing with productive cough for 2 days. EXAM: PORTABLE CHEST 1 VIEW COMPARISON:  12/26/2016 chest radiograph FINDINGS: Stable cardiomegaly given projection and technique. Pulmonary venous hypertension and prominent interstitial markings probably representing interstitial pulmonary edema. No focal consolidation. No pleural effusion. No acute osseous abnormality is evident. IMPRESSION: Cardiomegaly and mild interstitial pulmonary edema. No focal consolidation identified. Electronically Signed   By: Kristine Garbe M.D.   On: 07/13/2017 19:15    Microbiology: Recent Results (from the past 240 hour(s))  Blood Culture (routine x 2)     Status: None   Collection Time: 07/13/17  9:00 PM  Result Value Ref Range Status   Specimen Description BLOOD RIGHT FOREARM  Final   Special Requests   Final    Blood Culture results may not be optimal due to an inadequate volume of blood received in culture bottles    Culture NO GROWTH 5 DAYS  Final   Report Status 07/18/2017 FINAL  Final  Blood Culture (routine x 2)     Status: None   Collection Time: 07/13/17  9:00 PM  Result Value Ref Range Status   Specimen Description BLOOD LEFT FOREARM  Final   Special Requests   Final    Blood Culture results may not be optimal due to an inadequate volume of blood received in culture bottles   Culture NO GROWTH 5 DAYS  Final   Report Status 07/18/2017 FINAL  Final  MRSA PCR Screening     Status: None   Collection Time: 07/14/17 12:46 AM  Result Value Ref Range Status   MRSA by PCR NEGATIVE NEGATIVE Final    Comment:        The GeneXpert MRSA Assay (FDA approved for NASAL specimens only), is one component of a comprehensive MRSA colonization surveillance program. It is not intended to diagnose MRSA infection nor to guide or monitor treatment for MRSA infections.   Culture, Urine     Status: None   Collection Time: 07/15/17  2:15 PM  Result Value Ref Range Status   Specimen Description URINE, CLEAN CATCH  Final   Special Requests NONE  Final   Culture   Final    NO GROWTH Performed at [facility], [address], [place], [place] [address]  Report Status 07/17/2017 FINAL  Final     Labs: Basic Metabolic Panel:  Recent Labs Lab 07/16/17 0343 07/17/17 0547 07/18/17 0451 07/19/17 0548 07/20/17 0940 07/21/17 0546  NA 135 136 135 133* 132* 135  K 4.3 4.4 4.4 4.7 4.9 4.9  CL 105 103 102 101 97* 98*  CO2 _0 GLUCOSE 298* 228* 195* 272* 316* 133*  BUN 40* 37* 36* 38* 38* 36*  CREATININE 1.48* 1.55* 1.31* 1.44* 1.59* 1.47*  CALCIUM 8.9 9.4 9.5 9.5 9.8 9.6  MG 1.8  --   --   --   --   --    Liver Function Tests: No results for input(s): AST, ALT, ALKPHOS, BILITOT, PROT, ALBUMIN in the last 168 hours. No results for input(s): LIPASE, AMYLASE in the last 168 hours. No results for input(s): AMMONIA in the last 168 hours. CBC:  Recent Labs Lab 07/15/17 0446  07/16/17 0343 07/17/17 0547 07/18/17 0451 07/19/17 0548 07/21/17 0546  WBC 14.2* 9.4 10.9* 9.4 10.6* 13.5*  NEUTROABS 12.2*  --  9.6*  --   --   --   HGB 13.0 13.4 13.5 14.4 15.2 14.3  HCT 40.1 42.1 43.0 45.2 47.0 45.6  MCV 91.6 92.1 92.5 91.7 91.3 91.6  PLT 154 150 146* 146* 167 173   Cardiac Enzymes: No results for input(s): CKTOTAL, CKMB, CKMBINDEX, TROPONINI in the last 168 hours. BNP: BNP (last 3 results)  Recent Labs  07/13/17 1900 07/15/17 0522 07/18/17 0451  BNP 1,769.0* 824.0* 995.0*    ProBNP (last 3 results) No results for input(s): PROBNP in the last 8760 hours.  CBG:  Recent Labs Lab 07/20/17 0740 07/20/17 1132 07/20/17 2123 07/21/17 0724 07/21/17 1109  GLUCAP 250* 330* 196* 119* 247*       Signed:  Robyn Galati MD.  [facility] 07/21/2017, 3:16 PM

## 2017-07-23 LAB — GLUCOSE, CAPILLARY: GLUCOSE-CAPILLARY: 301 mg/dL — AB (ref 65–99)

## 2017-07-24 NOTE — Care Management (Signed)
CM received call from [facility], [place] address [place]. [facility] unable to accept pts insurance due to contract issues. [facility] also unable to accept pt. Delila Spenceory, [facility] rep, will look and let me know if they are able to accept pt.

## 2017-07-24 NOTE — Care Management (Signed)
Per Delila Spenceory, [facility] rep, [facility] CM notified pt's spouse of [facility].

## 2017-11-06 ENCOUNTER — Emergency Department: Payer: Medicare Other

## 2017-11-06 ENCOUNTER — Inpatient Hospital Stay
Admission: EM | Admit: 2017-11-06 | Discharge: 2017-11-09 | DRG: 309 | Disposition: A | Discharge status: Home / Self Care | Payer: Medicare Other | Attending: Internal Medicine | Admitting: Internal Medicine

## 2017-11-06 ENCOUNTER — Other Ambulatory Visit: Payer: Self-pay

## 2017-11-06 DIAGNOSIS — K219 Gastro-esophageal reflux disease without esophagitis: Secondary | ICD-10-CM | POA: Diagnosis present

## 2017-11-06 DIAGNOSIS — I272 Pulmonary hypertension, unspecified: Secondary | ICD-10-CM | POA: Diagnosis present

## 2017-11-06 DIAGNOSIS — E1122 Type 2 diabetes mellitus with diabetic chronic kidney disease: Secondary | ICD-10-CM | POA: Diagnosis present

## 2017-11-06 DIAGNOSIS — Z7982 Long term (current) use of aspirin: Secondary | ICD-10-CM

## 2017-11-06 DIAGNOSIS — I4892 Unspecified atrial flutter: Secondary | ICD-10-CM

## 2017-11-06 DIAGNOSIS — R0902 Hypoxemia: Secondary | ICD-10-CM | POA: Diagnosis present

## 2017-11-06 DIAGNOSIS — I4510 Unspecified right bundle-branch block: Secondary | ICD-10-CM | POA: Diagnosis present

## 2017-11-06 DIAGNOSIS — N182 Chronic kidney disease, stage 2 (mild): Secondary | ICD-10-CM | POA: Diagnosis present

## 2017-11-06 DIAGNOSIS — N179 Acute kidney failure, unspecified: Secondary | ICD-10-CM | POA: Diagnosis present

## 2017-11-06 DIAGNOSIS — Z7951 Long term (current) use of inhaled steroids: Secondary | ICD-10-CM

## 2017-11-06 DIAGNOSIS — Z7984 Long term (current) use of oral hypoglycemic drugs: Secondary | ICD-10-CM

## 2017-11-06 DIAGNOSIS — I495 Sick sinus syndrome: Secondary | ICD-10-CM | POA: Diagnosis present

## 2017-11-06 DIAGNOSIS — R002 Palpitations: Secondary | ICD-10-CM | POA: Diagnosis not present

## 2017-11-06 DIAGNOSIS — I4891 Unspecified atrial fibrillation: Secondary | ICD-10-CM | POA: Diagnosis present

## 2017-11-06 DIAGNOSIS — F17210 Nicotine dependence, cigarettes, uncomplicated: Secondary | ICD-10-CM | POA: Diagnosis present

## 2017-11-06 DIAGNOSIS — I130 Hypertensive heart and chronic kidney disease with heart failure and stage 1 through stage 4 chronic kidney disease, or unspecified chronic kidney disease: Secondary | ICD-10-CM | POA: Diagnosis present

## 2017-11-06 DIAGNOSIS — I248 Other forms of acute ischemic heart disease: Secondary | ICD-10-CM | POA: Diagnosis present

## 2017-11-06 DIAGNOSIS — R7989 Other specified abnormal findings of blood chemistry: Secondary | ICD-10-CM

## 2017-11-06 DIAGNOSIS — J449 Chronic obstructive pulmonary disease, unspecified: Secondary | ICD-10-CM | POA: Diagnosis present

## 2017-11-06 DIAGNOSIS — I5042 Chronic combined systolic (congestive) and diastolic (congestive) heart failure: Secondary | ICD-10-CM | POA: Diagnosis present

## 2017-11-06 DIAGNOSIS — R778 Other specified abnormalities of plasma proteins: Secondary | ICD-10-CM

## 2017-11-06 DIAGNOSIS — I482 Chronic atrial fibrillation: Principal | ICD-10-CM | POA: Diagnosis present

## 2017-11-06 DIAGNOSIS — E785 Hyperlipidemia, unspecified: Secondary | ICD-10-CM | POA: Diagnosis present

## 2017-11-06 DIAGNOSIS — E7800 Pure hypercholesterolemia, unspecified: Secondary | ICD-10-CM | POA: Diagnosis present

## 2017-11-06 DIAGNOSIS — Z9981 Dependence on supplemental oxygen: Secondary | ICD-10-CM

## 2017-11-06 DIAGNOSIS — Z86718 Personal history of other venous thrombosis and embolism: Secondary | ICD-10-CM

## 2017-11-06 DIAGNOSIS — Z86711 Personal history of pulmonary embolism: Secondary | ICD-10-CM

## 2017-11-06 DIAGNOSIS — Z79899 Other long term (current) drug therapy: Secondary | ICD-10-CM

## 2017-11-06 DIAGNOSIS — Z8249 Family history of ischemic heart disease and other diseases of the circulatory system: Secondary | ICD-10-CM

## 2017-11-06 LAB — BASIC METABOLIC PANEL
ANION GAP: 15 (ref 5–15)
BUN: 32 mg/dL — ABNORMAL HIGH (ref 6–20)
CHLORIDE: 104 mmol/L (ref 101–111)
CO2: 20 mmol/L — AB (ref 22–32)
CREATININE: 1.5 mg/dL — AB (ref 0.61–1.24)
Calcium: 10.1 mg/dL (ref 8.9–10.3)
GFR calc non Af Amer: 44 mL/min — ABNORMAL LOW (ref >60)
GFR, EST AFRICAN AMERICAN: 51 mL/min — AB (ref >60)
Glucose, Bld: 201 mg/dL — ABNORMAL HIGH (ref 65–99)
Potassium: 4.3 mmol/L (ref 3.5–5.1)
Sodium: 139 mmol/L (ref 135–145)

## 2017-11-06 LAB — CBC WITH DIFFERENTIAL/PLATELET
BASOS PCT: 1 %
Basophils Absolute: 0 10*3/uL (ref 0–0.1)
Eosinophils Absolute: 0 10*3/uL (ref 0–0.7)
Eosinophils Relative: 1 %
HEMATOCRIT: 45.9 % (ref 40.0–52.0)
HEMOGLOBIN: 14.5 g/dL (ref 13.0–18.0)
LYMPHS PCT: 11 %
Lymphs Abs: 0.7 10*3/uL — ABNORMAL LOW (ref 1.0–3.6)
MCH: 30 pg (ref 26.0–34.0)
MCHC: 31.5 g/dL — AB (ref 32.0–36.0)
MCV: 95.2 fL (ref 80.0–100.0)
MONO ABS: 0.4 10*3/uL (ref 0.2–1.0)
MONOS PCT: 7 %
NEUTROS ABS: 5.2 10*3/uL (ref 1.4–6.5)
NEUTROS PCT: 80 %
Platelets: 199 10*3/uL (ref 150–440)
RBC: 4.82 MIL/uL (ref 4.40–5.90)
RDW: 16.7 % — AB (ref 11.5–14.5)
WBC: 6.5 10*3/uL (ref 3.8–10.6)

## 2017-11-06 LAB — TROPONIN I
Troponin I: 0.44 ng/mL (ref <0.03)
Troponin I: 0.41 ng/mL (ref <0.03)

## 2017-11-06 LAB — GLUCOSE, CAPILLARY: GLUCOSE-CAPILLARY: 130 mg/dL — AB (ref 65–99)

## 2017-11-06 LAB — BRAIN NATRIURETIC PEPTIDE: B NATRIURETIC PEPTIDE 5: 1231 pg/mL — AB (ref 0.0–100.0)

## 2017-11-06 MED ORDER — ASPIRIN EC 81 MG PO TBEC
81.0000 mg | DELAYED_RELEASE_TABLET | Freq: Every day | ORAL | Status: DC
  Administered 2017-11-07 – 2017-11-09 (×3): 81 mg via ORAL
  Filled 2017-11-06 (×3): qty 1

## 2017-11-06 MED ORDER — ASPIRIN 81 MG PO CHEW
324.0000 mg | CHEWABLE_TABLET | Freq: Once | ORAL | Status: AC
  Administered 2017-11-06: 324 mg via ORAL
  Filled 2017-11-06: qty 4

## 2017-11-06 MED ORDER — ONDANSETRON HCL 4 MG PO TABS
4.0000 mg | ORAL_TABLET | Freq: Four times a day (QID) | ORAL | Status: DC | PRN
  Administered 2017-11-07: 4 mg via ORAL
  Filled 2017-11-06: qty 1

## 2017-11-06 MED ORDER — ALBUTEROL SULFATE (2.5 MG/3ML) 0.083% IN NEBU
3.0000 mL | INHALATION_SOLUTION | Freq: Four times a day (QID) | RESPIRATORY_TRACT | Status: DC | PRN
  Administered 2017-11-06: 3 mL via RESPIRATORY_TRACT
  Filled 2017-11-06: qty 3

## 2017-11-06 MED ORDER — GUAIFENESIN-DM 100-10 MG/5ML PO SYRP
5.0000 mL | ORAL_SOLUTION | ORAL | Status: DC | PRN
  Administered 2017-11-06: 5 mL via ORAL
  Filled 2017-11-06: qty 5

## 2017-11-06 MED ORDER — PANTOPRAZOLE SODIUM 40 MG PO TBEC
40.0000 mg | DELAYED_RELEASE_TABLET | Freq: Every day | ORAL | Status: DC
  Administered 2017-11-07 – 2017-11-09 (×3): 40 mg via ORAL
  Filled 2017-11-06 (×3): qty 1

## 2017-11-06 MED ORDER — PRAVASTATIN SODIUM 40 MG PO TABS
40.0000 mg | ORAL_TABLET | Freq: Every day | ORAL | Status: DC
  Administered 2017-11-07 – 2017-11-08 (×2): 40 mg via ORAL
  Filled 2017-11-06 (×2): qty 1

## 2017-11-06 MED ORDER — ACETAMINOPHEN 325 MG PO TABS: 650.0000 mg | ORAL_TABLET | Freq: Four times a day (QID) | ORAL | Status: DC | PRN

## 2017-11-06 MED ORDER — ACETAMINOPHEN 650 MG RE SUPP
650.0000 mg | Freq: Four times a day (QID) | RECTAL | Status: DC | PRN
Start: 2017-11-06 — End: 2017-11-09

## 2017-11-06 MED ORDER — MOMETASONE FURO-FORMOTEROL FUM 200-5 MCG/ACT IN AERO
2.0000 | INHALATION_SPRAY | Freq: Two times a day (BID) | RESPIRATORY_TRACT | Status: DC
  Administered 2017-11-06 – 2017-11-09 (×6): 2 via RESPIRATORY_TRACT
  Filled 2017-11-06 (×2): qty 8.8

## 2017-11-06 MED ORDER — ONDANSETRON HCL 4 MG/2ML IJ SOLN: 4.0000 mg | Freq: Four times a day (QID) | INTRAMUSCULAR | Status: DC | PRN

## 2017-11-06 MED ORDER — POLYETHYLENE GLYCOL 3350 17 G PO PACK: 17.0000 g | PACK | Freq: Every day | ORAL | Status: DC | PRN

## 2017-11-06 MED ORDER — ENOXAPARIN SODIUM 40 MG/0.4ML ~~LOC~~ SOLN
40.0000 mg | SUBCUTANEOUS | Status: DC
  Administered 2017-11-06 – 2017-11-08 (×3): 40 mg via SUBCUTANEOUS
  Filled 2017-11-06 (×3): qty 0.4

## 2017-11-06 MED ORDER — DILTIAZEM HCL ER COATED BEADS 120 MG PO CP24
120.0000 mg | ORAL_CAPSULE | Freq: Every day | ORAL | Status: DC
  Administered 2017-11-07: 120 mg via ORAL
  Filled 2017-11-06: qty 1

## 2017-11-06 MED ORDER — METOPROLOL TARTRATE 25 MG PO TABS
12.5000 mg | ORAL_TABLET | Freq: Two times a day (BID) | ORAL | Status: DC
  Administered 2017-11-06 – 2017-11-07 (×3): 12.5 mg via ORAL
  Filled 2017-11-06 (×4): qty 1

## 2017-11-06 MED ORDER — FUROSEMIDE 40 MG PO TABS
40.0000 mg | ORAL_TABLET | Freq: Two times a day (BID) | ORAL | Status: DC
  Administered 2017-11-06 – 2017-11-08 (×4): 40 mg via ORAL
  Filled 2017-11-06 (×4): qty 1

## 2017-11-06 MED ORDER — GLIPIZIDE 5 MG PO TABS
5.0000 mg | ORAL_TABLET | Freq: Two times a day (BID) | ORAL | Status: DC
  Administered 2017-11-07 – 2017-11-08 (×3): 5 mg via ORAL
  Filled 2017-11-06 (×3): qty 1

## 2017-11-06 NOTE — ED Notes (Signed)
Elevated troponin of 0.44 reported to Dr Derrill KayGoodman - no new orders at this time

## 2017-11-06 NOTE — ED Provider Notes (Addendum)
[facility] Emergency Department Provider Note ________________________________________   I have reviewed the triage vital signs and the nursing notes.   HISTORY  Chief Complaint None  History limited by: Not Limited   HPI Austin [name] is a 76 y.o. male who presents to the emergency department today from primary care physician's office because of abnormal ekg.  DURATION:unknown TIMING: unknown QUALITY: wide complex tachycardia CONTEXT: patient was at doctor's office today for scheduled check up. Was noted to have an abnormal EKG. Was advised to present to the emergency department.  MODIFYING FACTORS: none ASSOCIATED SYMPTOMS: denies chest pain.  Per medical record review patient has a history of RBBB and paroxysmal afib.  Past Medical History:  Diagnosis Date  . Acute lower UTI 07/16/2017  . Asthma   . Atrial fibrillation (HCC)   . CHF (congestive heart failure) (HCC)   . Chronic atrial fibrillation (HCC)   . 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 COPD (chronic obstructive pulmonary disease) (HCC)   . Diabetes mellitus without complication (HCC)   . DVT (deep venous thrombosis) (HCC)   . Gastric ulcer   . GERD (gastroesophageal reflux disease)   . Hypercholesteremia   . Hypertension   . Moderate to severe pulmonary hypertension (HCC)--per 2-D echo 07/14/2017 07/15/2017  . PE (pulmonary embolism)   . Sepsis due to undetermined organism with acute respiratory failure (HCC) 07/13/2017    Patient Active Problem List   Diagnosis Date Noted  . 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 Acute diastolic CHF (congestive heart failure) (HCC)   . Chronic atrial fibrillation (HCC)   . Acute lower UTI 07/16/2017  . Sepsis (HCC)   . Acute respiratory failure with hypoxia (HCC) 07/15/2017  . Moderate to severe pulmonary hypertension (HCC)--per 2-D echo 07/14/2017 07/15/2017  . Mass of lung  07/15/2017  . Ascending aortic aneurysm (HCC) 07/14/2017  . Elevated bilirubin 07/14/2017  . Left nephrolithiasis 07/14/2017  . Lactic acidosis 07/14/2017  . Sepsis due to undetermined organism with acute respiratory failure (HCC) 07/13/2017  . COPD exacerbation (HCC) 12/26/2016  . Hypoxia 11/03/2015  . COPD (chronic obstructive pulmonary disease) (HCC) 11/03/2015  . DVT of popliteal vein (HCC) 11/03/2015  . Chronic systolic CHF (congestive heart failure) (HCC) 11/03/2015  . Essential hypertension 11/03/2015  . Diabetes mellitus (HCC) 11/03/2015  . Continuous tobacco abuse 11/03/2015  . Chronic pulmonary embolism (HCC) 11/03/2015  . 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 Hemoptysis 10/31/2015  . Elevated troponin 09/16/2015  . SOB (shortness of breath) 08/24/2015  . Pulmonary embolism (HCC) 08/24/2015  . 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 Type 2 diabetes mellitus (HCC) 08/16/2015  . HLD (hyperlipidemia) 08/16/2015  . GERD (gastroesophageal reflux disease) 08/16/2015  . Chest pain 08/16/2015  . Constipation 08/16/2015  . HTN (hypertension) 07/21/2015  . COPD (chronic obstructive pulmonary disease) with chronic bronchitis (HCC) 07/21/2015  . CHF (NYHA class IV, ACC/AHA stage D) (HCC) 07/04/2015  . CHF (congestive heart failure) (HCC) 07/04/2015    Past Surgical History:  Procedure Laterality Date  . HERNIA REPAIR    . PERIPHERAL VASCULAR CATHETERIZATION N/A 11/03/2015   Procedure: IVC Filter Insertion;  Surgeon: Annice Needy, MD;  Location: [facility] INVASIVE CV LAB;  Service: Cardiovascular;  Laterality: N/A;  . PERIPHERAL VASCULAR CATHETERIZATION N/A 07/24/2016   Procedure: IVC Filter Removal;  Surgeon: Annice Needy, MD;  Location: [facility] INVASIVE CV LAB;  Service: Cardiovascular;  Laterality: N/A;  . PROSTATE SURGERY      Prior to Admission medications   Medication Sig Start Date End Date Taking? Authorizing  Provider  albuterol (PROAIR HFA) 108 (90 Base) MCG/ACT inhaler Inhale 1-2 puffs into the lungs every 6 (six) hours as needed for wheezing or shortness of breath.    [provider]  aspirin EC 81 MG tablet Take 81 mg by mouth daily.    [provider]  budesonide-formoterol (SYMBICORT) 160-4.5 MCG/ACT inhaler Inhale 1 puff into the lungs 2 (two) times daily. 07/21/17   Elliot CousinFisher, Denise, MD  diltiazem (CARDIZEM CD) 120 MG 24 hr capsule Take 1 capsule (120 mg total) by mouth daily. NEW MEDICATION FOR YOUR HEART 07/21/17 07/21/18  Elliot CousinFisher, Denise, MD  furosemide (LASIX) 40 MG tablet Take 1 tablet (40 mg total) by mouth 2 (two) times daily. 07/21/15   Delma FreezeHackney, Tina A, FNP  glipiZIDE (GLUCOTROL) 5 MG tablet Take 1 tablet (5 mg total) by mouth 2 (two) times daily. DO NOT TAKE THIS DIABETES MEDICINE IF YOUR BLOOD SUGAR IS LESS THAN 130. 07/21/17   Elliot CousinFisher, Denise, MD  levalbuterol Pauline Aus(XOPENEX) 0.63 MG/3ML nebulizer solution Take 3 mLs (0.63 mg total) by nebulization every 4 (four) hours as needed for wheezing or shortness of breath. 07/21/17 07/21/18  Elliot CousinFisher, Denise, MD  lisinopril (PRINIVIL,ZESTRIL) 5 MG tablet Take 1 tablet (5 mg total) by mouth daily. 07/21/17 07/21/18  Elliot CousinFisher, Denise, MD  lovastatin (MEVACOR) 40 MG tablet Take 40 mg by mouth at bedtime. Pt should take with food.     [provider]  metFORMIN (GLUCOPHAGE) 500 MG tablet Take 500 mg by mouth 2 (two) times daily with a meal.    [provider]  metoprolol tartrate (LOPRESSOR) 25 MG tablet Take 12.5 mg by mouth 2 (two) times daily.     [provider]  nitroGLYCERIN (NITROSTAT) 0.4 MG SL tablet Place 0.4 mg under the tongue every 5 (five) minutes x 3 doses as needed for chest pain. If no relief call 911 or go to emergency room.    [provider]  omeprazole (PRILOSEC) 40 MG capsule Take 40 mg by mouth daily.    [provider]  polyethylene glycol (MIRALAX) packet Take 17 g by mouth daily as  needed for moderate constipation. 08/17/15   Delfino LovettShah, Vipul, MD  sildenafil (REVATIO) 20 MG tablet Take 1 tablet (20 mg total) by mouth 3 (three) times daily. 07/21/17   Elliot CousinFisher, Denise, MD    Allergies Contrast media [iodinated diagnostic agents] and Other  Family History  Problem Relation Age of Onset  . CAD Brother   . Congestive Heart Failure Brother     Social History Social History   Tobacco Use  . Smoking status: Current Every Day Smoker    Packs/day: 0.50    Years: 45.00    Pack years: 22.50    Types: Cigarettes  . Smokeless tobacco: Never Used  Substance Use Topics  . Alcohol use: No  . Drug use: No    Review of Systems Constitutional: No fever/chills Eyes: No visual changes. ENT: No sore throat. Cardiovascular: Denies chest pain. Respiratory: Positive for some chronic shortness of breath. Gastrointestinal: No abdominal pain.  No nausea, no vomiting.  No diarrhea.   Genitourinary: Negative for dysuria. Musculoskeletal: Negative for back pain. Skin: Negative for rash. Neurological: Negative for headaches, focal weakness or numbness.  ____________________________________________   PHYSICAL EXAM:  VITAL SIGNS: ED Triage Vitals  Enc Vitals Group  BP 11/06/17 1628 117/84     Pulse Rate 11/06/17 1628 (!) 123     Resp 11/06/17 1628 (!) 24     Temp 11/06/17 1628 97.9 F (36.6 C)     Temp Source 11/06/17 1628 Oral     SpO2 11/06/17 1628 100 %     Weight 11/06/17 1629 203 lb (92.1 kg)    Constitutional: Alert and oriented. Well appearing and in no distress. Eyes: Conjunctivae are normal.  ENT   Head: Normocephalic and atraumatic.   Nose: No congestion/rhinnorhea.   Mouth/Throat: Mucous membranes are moist.   Neck: No stridor. Hematological/Lymphatic/Immunilogical: No cervical lymphadenopathy. Cardiovascular: Tachycardic, regular rhythm.  No murmurs, rubs, or gallops. Respiratory: Normal respiratory effort without tachypnea nor retractions.  Breath sounds are clear and equal bilaterally. No wheezes/rales/rhonchi. Gastrointestinal: Soft and non tender. No rebound. No guarding.  Genitourinary: Deferred Musculoskeletal: Normal range of motion in all extremities. No lower extremity edema. Neurologic:  Normal speech and language. No gross focal neurologic deficits are appreciated.  Skin:  Skin is warm, dry and intact. No rash noted. Psychiatric: Mood and affect are normal. Speech and behavior are normal. Patient exhibits appropriate insight and judgment.  ____________________________________________    LABS (pertinent positives/negatives)  Trop 0.44 BMP cr 1.50, glu 201 CBC wbc 6.5, hgb 14.5  ____________________________________________   EKG  I, Phineas SemenGraydon Niamh Rada, attending physician, personally viewed and interpreted this EKG  EKG Time: 1621 Rate: 123 Rhythm: sinus tachycardia Axis: right axis deviation Intervals: qtc 535 QRS: RBBB, LPFB ST changes: no st elevation Impression: abnormal ekg  I, Phineas SemenGraydon Adel Burch, attending physician, personally viewed and interpreted this EKG  EKG Time: 1714 Rate: 105 Rhythm: atrial flutter Axis: right axis deviation Intervals: qtc 534 QRS: RBBB ST changes: no st elevation Impression: abnormal ekg   ____________________________________________    RADIOLOGY  CXR No acute findings  ____________________________________________   PROCEDURES  Procedures  CRITICAL CARE Performed by: Phineas SemenGOODMAN, Merlina Marchena   Total critical care time: 30 minutes  Critical care time was exclusive of separately billable procedures and treating other patients.  Critical care was necessary to treat or prevent imminent or life-threatening deterioration.  Critical care was time spent personally by me on the following activities: development of treatment plan with patient and/or surrogate as well as nursing, discussions with consultants, evaluation of patient's response to treatment, examination  of patient, obtaining history from patient or surrogate, ordering and performing treatments and interventions, ordering and review of laboratory studies, ordering and review of radiographic studies, pulse oximetry and re-evaluation of patient's condition.  ____________________________________________   INITIAL IMPRESSION / ASSESSMENT AND PLAN / ED COURSE  Pertinent labs & imaging results that were available during my care of the patient were reviewed by me and considered in my medical decision making (see chart for details).  Patient presented to the emergency department today because of concern for abnormal EKG noted at doctors office. Concern for ischemic event, electrolyte abnormality, endocarditis amongst other etiologies. The patient's initial EKG was a wide complex tachycardia. Prior to any medication being given patient's rhythm slowed. Repeat EKG shows aflutter. Discussed with cardiology Dr. Juliann Paresallwood, the patient's cardiologist. Troponin was elevated to 0.44. Given elevation of troponin will plan on admission to the hospitalist service for further work up and management. Discussed findings and plan with patient and family.  _______________________________________   FINAL CLINICAL IMPRESSION(S) / ED DIAGNOSES  Final diagnoses:  Atrial flutter, unspecified type (HCC)  Elevated troponin     Note: This dictation was prepared with  Office manager. Any transcriptional errors that result from this process are unintentional     Phineas Semen, MD 11/06/17 1827    Phineas Semen, MD 11/06/17 [phone]

## 2017-11-06 NOTE — H&P (Signed)
[employer] - [place] [facility]   PATIENT NAME: Austin [name]    MR#:  [id number]  DATE OF BIRTH:  Nov 04, 1941  DATE OF ADMISSION:  11/06/2017  PRIMARY CARE PHYSICIAN: [facility]   REQUESTING/REFERRING PHYSICIAN: Dr. Phineas SemenGraydon Goodman  CHIEF COMPLAINT:   Chief Complaint  Patient presents with  . Palpitations    HISTORY OF PRESENT ILLNESS:  Austin [name]  is a 76 y.o. male with a known history of atrial fibrillation, CHF, previous history of DVT, previous history of GI bleed, hypertension, hyperlipidemia, diabetes who presents to the hospital from his primary care physician's office due to palpitations and noted to have abnormal EKG. Patient was in his usual state of health and had a follow-up with his primary care physician's office and was noted to be somewhat tachycardic, EKG was obtained which was showing atrial flutter and therefore he was referred to the ER for further evaluation. In the emergency room on routine blood work patient was noted to have a mildly elevated at 0.44. He denies any S pain, but admits to some shortness of breath which is chronic for the patient. He denies any nausea vomiting fevers or chills cough congestion abdominal pain and melena, hematochezia or any other associated symptoms presently.  PAST MEDICAL HISTORY:   Past Medical History:  Diagnosis Date  . Acute lower UTI 07/16/2017  . Asthma   . Atrial fibrillation (HCC)   . CHF (congestive heart failure) (HCC)   . Chronic atrial fibrillation (HCC)   . 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 COPD (chronic obstructive pulmonary disease) (HCC)   . Diabetes mellitus without complication (HCC)   . DVT (deep venous thrombosis) (HCC)   . Gastric ulcer   . GERD (gastroesophageal reflux disease)   . Hypercholesteremia   . Hypertension   . Moderate to severe pulmonary hypertension (HCC)--per 2-D echo 07/14/2017 07/15/2017  . PE  (pulmonary embolism)   . Sepsis due to undetermined organism with acute respiratory failure (HCC) 07/13/2017    PAST SURGICAL HISTORY:   Past Surgical History:  Procedure Laterality Date  . HERNIA REPAIR    . PERIPHERAL VASCULAR CATHETERIZATION N/A 11/03/2015   Procedure: IVC Filter Insertion;  Surgeon: Annice NeedyJason S Dew, MD;  Location: [facility] INVASIVE CV LAB;  Service: Cardiovascular;  Laterality: N/A;  . PERIPHERAL VASCULAR CATHETERIZATION N/A 07/24/2016   Procedure: IVC Filter Removal;  Surgeon: Annice NeedyJason S Dew, MD;  Location: [facility] INVASIVE CV LAB;  Service: Cardiovascular;  Laterality: N/A;  . PROSTATE SURGERY      SOCIAL HISTORY:   Social History   Tobacco Use  . Smoking status: Current Every Day Smoker    Packs/day: 1.00    Years: 45.00    Pack years: 45.00    Types: Cigarettes  . Smokeless tobacco: Never Used  Substance Use Topics  . Alcohol use: No    FAMILY HISTORY:   Family History  Problem Relation Age of Onset  . CAD Brother   . Congestive Heart Failure Brother     DRUG ALLERGIES:   Allergies  Allergen Reactions  . Contrast Media [Iodinated Diagnostic Agents] Nausea And Vomiting  . Other Other (See Comments)    Cannot have "high doses of anesthesia because of his lungs"    REVIEW OF SYSTEMS:   Review of Systems  Constitutional: Negative for fever and weight loss.  HENT: Negative for congestion, nosebleeds and tinnitus.   Eyes: Negative for blurred vision, double vision and  redness.  Respiratory: Positive for shortness of breath. Negative for cough and hemoptysis.   Cardiovascular: Negative for chest pain, orthopnea, leg swelling and PND.  Gastrointestinal: Negative for abdominal pain, diarrhea, melena, nausea and vomiting.  Genitourinary: Negative for dysuria, hematuria and urgency.  Musculoskeletal: Negative for falls and joint pain.  Neurological: Negative for dizziness, tingling, sensory change, focal weakness, seizures, weakness and headaches.   Endo/Heme/Allergies: Negative for polydipsia. Does not bruise/bleed easily.  Psychiatric/Behavioral: Negative for depression and memory loss. The patient is not nervous/anxious.     MEDICATIONS AT HOME:   Prior to Admission medications   Medication Sig Start Date End Date Taking? Authorizing Provider  albuterol (PROAIR HFA) 108 (90 Base) MCG/ACT inhaler Inhale 1-2 puffs into the lungs every 6 (six) hours as needed for wheezing or shortness of breath.    [provider]  aspirin EC 81 MG tablet Take 81 mg by mouth daily.    [provider]  budesonide-formoterol (SYMBICORT) 160-4.5 MCG/ACT inhaler Inhale 1 puff into the lungs 2 (two) times daily. 07/21/17   Elliot Cousin, MD  diltiazem (CARDIZEM CD) 120 MG 24 hr capsule Take 1 capsule (120 mg total) by mouth daily. NEW MEDICATION FOR YOUR HEART 07/21/17 07/21/18  Elliot Cousin, MD  furosemide (LASIX) 40 MG tablet Take 1 tablet (40 mg total) by mouth 2 (two) times daily. 07/21/15   Delma Freeze, FNP  glipiZIDE (GLUCOTROL) 5 MG tablet Take 1 tablet (5 mg total) by mouth 2 (two) times daily. DO NOT TAKE THIS DIABETES MEDICINE IF YOUR BLOOD SUGAR IS LESS THAN 130. 07/21/17   Elliot Cousin, MD  levalbuterol Pauline Aus) 0.63 MG/3ML nebulizer solution Take 3 mLs (0.63 mg total) by nebulization every 4 (four) hours as needed for wheezing or shortness of breath. 07/21/17 07/21/18  Elliot Cousin, MD  lisinopril (PRINIVIL,ZESTRIL) 5 MG tablet Take 1 tablet (5 mg total) by mouth daily. 07/21/17 07/21/18  Elliot Cousin, MD  lovastatin (MEVACOR) 40 MG tablet Take 40 mg by mouth at bedtime. Pt should take with food.     [provider]  metFORMIN (GLUCOPHAGE) 500 MG tablet Take 500 mg by mouth 2 (two) times daily with a meal.    [provider]  metoprolol tartrate (LOPRESSOR) 25 MG tablet Take 12.5 mg by mouth 2 (two) times daily.     [provider]  nitroGLYCERIN (NITROSTAT) 0.4 MG SL tablet Place 0.4 mg under the  tongue every 5 (five) minutes x 3 doses as needed for chest pain. If no relief call 911 or go to emergency room.    [provider]  omeprazole (PRILOSEC) 40 MG capsule Take 40 mg by mouth daily.    [provider]  polyethylene glycol (MIRALAX) packet Take 17 g by mouth daily as needed for moderate constipation. 08/17/15   Delfino Lovett, MD  sildenafil (REVATIO) 20 MG tablet Take 1 tablet (20 mg total) by mouth 3 (three) times daily. 07/21/17   Elliot Cousin, MD      VITAL SIGNS:  Blood pressure 114/88, pulse (!) 57, temperature 97.9 F (36.6 C), temperature source Oral, resp. rate 20, weight 92.1 kg (203 lb), SpO2 96 %.  PHYSICAL EXAMINATION:  Physical Exam  GENERAL:  76 y.o.-year-old patient lying in the bed with no acute distress.  EYES: Pupils equal, round, reactive to light and accommodation. No scleral icterus. Extraocular muscles intact.  HEENT: Head atraumatic, normocephalic. Oropharynx and nasopharynx clear. No oropharyngeal erythema, moist oral mucosa. Poor Dentition.  NECK:  Supple,  no jugular venous distention. No thyroid enlargement, no tenderness.  LUNGS: Normal breath sounds bilaterally, no wheezing, rales, rhonchi. No use of accessory muscles of respiration.  CARDIOVASCULAR: S1, S2 Irregular. No murmurs, rubs, gallops, clicks.  ABDOMEN: Soft, nontender, nondistended. Bowel sounds present. No organomegaly or mass.  EXTREMITIES: No pedal edema, cyanosis, or clubbing. + 2 pedal & radial pulses b/l.   NEUROLOGIC: Cranial nerves II through XII are intact. No focal Motor or sensory deficits appreciated b/l PSYCHIATRIC: The patient is alert and oriented x 3.  SKIN: No obvious rash, lesion, or ulcer.   LABORATORY PANEL:   CBC Recent Labs  Lab 11/06/17 1639  WBC 6.5  HGB 14.5  HCT 45.9  PLT 199   ------------------------------------------------------------------------------------------------------------------  Chemistries  Recent Labs  Lab 11/06/17 1639   NA 139  K 4.3  CL 104  CO2 20*  GLUCOSE 201*  BUN 32*  CREATININE 1.50*  CALCIUM 10.1   ------------------------------------------------------------------------------------------------------------------  Cardiac Enzymes Recent Labs  Lab 11/06/17 1639  TROPONINI 0.44*   ------------------------------------------------------------------------------------------------------------------  RADIOLOGY:  Dg Chest 2 View  Result Date: 11/06/2017 CLINICAL DATA:  Shortness of breath EXAM: CHEST  2 VIEW COMPARISON:  07/20/2017 FINDINGS: Chronic interstitial coarsening that is likely from patient's COPD/emphysema. No generalized Kerley lines. There is persistent posterior costophrenic sulcus blunting and pleural fluid. Chronic cardiomegaly. Stable mediastinal contours. Patient has known pulmonary nodules by chest CT 07/13/2017. A nipple shadow is seen on the right today. IMPRESSION: 1. No acute finding when compared to prior. 2. Trace pleural fluid/atelectasis at the bases that was also seen previously. 3. Cardiomegaly and congested central vessels. Question pulmonary hypertension. 4. Emphysema. Electronically Signed   By: Marnee SpringJonathon  Watts M.D.   On: 11/06/2017 17:58     IMPRESSION AND PLAN:   76 year old male with past medical history of chronic atrial fibrillation, previous history of GI bleed, history of DVT status post IVC filter, essential hypertension, diabetes who presents to the hospital due to palpitations and noted to be in atrial fibrillation/flutter.  1. Atrial fibrillation/flutter-patient has history of chronic A. fib but his heart rates were somewhat labile at his primary care physician's office. -Continue his Cardizem, metoprolol for now. If needed will give pulse doses of IV Cardizem. Heart rates are stable presently.  2. Elevated troponin-suspected to be secondary to supply demand ischemia from his A. fib with RVR. -No acute chest pain, we'll cycle his cardiac markers, observe  him on telemetry. Continue aspirin, statin, beta blocker. - will consult Cardiology (Dr. Juliann Paresallwood)  3. Essential hypertension-continue metoprolol, Cardizem  4. Diabetes type 2 without complication-continue glipizide, hold metformin for now given some mild acute kidney injury. Follow blood sugars.  5. Hyperlipidemia-continue Pravachol.  6. COPD-no acute exacerbation. Continue Symbicort, albuterol inhaler as needed.   All the records are reviewed and case discussed with ED provider. Management plans discussed with the patient, family and they are in agreement.  CODE STATUS: Full code  TOTAL TIME TAKING CARE OF THIS PATIENT: 40 minutes.    Houston SirenSAINANI,VIVEK J M.D on 11/06/2017 at 6:39 PM  Between 7am to 6pm - Pager - [phone]  After 6pm go to [email or web address] - password [email or web address] [facility]  [facility]  Office  [phone]  CC: Primary care physician; [facility]

## 2017-11-06 NOTE — ED Triage Notes (Signed)
Pt sent to ER via POV after seeing his PCP today and having abnormal EKG. Pt reports palpitations intermittently. Denies CP. Pt exertionally SOB at baseline, wears oxygen as needed. Pt wearing oxygen at this time. Alert and oriented X 4.

## 2017-11-07 LAB — BASIC METABOLIC PANEL
ANION GAP: 9 (ref 5–15)
BUN: 26 mg/dL — ABNORMAL HIGH (ref 6–20)
CALCIUM: 9.8 mg/dL (ref 8.9–10.3)
CO2: 22 mmol/L (ref 22–32)
CREATININE: 1.35 mg/dL — AB (ref 0.61–1.24)
Chloride: 106 mmol/L (ref 101–111)
GFR, EST AFRICAN AMERICAN: 58 mL/min — AB (ref >60)
GFR, EST NON AFRICAN AMERICAN: 50 mL/min — AB (ref >60)
Glucose, Bld: 89 mg/dL (ref 65–99)
Potassium: 4 mmol/L (ref 3.5–5.1)
SODIUM: 137 mmol/L (ref 135–145)

## 2017-11-07 LAB — GLUCOSE, CAPILLARY
GLUCOSE-CAPILLARY: 79 mg/dL (ref 65–99)
GLUCOSE-CAPILLARY: 157 mg/dL — AB (ref 65–99)
Glucose-Capillary: 175 mg/dL — ABNORMAL HIGH (ref 65–99)
Glucose-Capillary: 90 mg/dL (ref 65–99)

## 2017-11-07 LAB — CBC
HCT: 41.7 % (ref 40.0–52.0)
HEMOGLOBIN: 13.4 g/dL (ref 13.0–18.0)
MCH: 30 pg (ref 26.0–34.0)
MCHC: 32.1 g/dL (ref 32.0–36.0)
MCV: 93.4 fL (ref 80.0–100.0)
PLATELETS: 173 10*3/uL (ref 150–440)
RBC: 4.47 MIL/uL (ref 4.40–5.90)
RDW: 16.9 % — ABNORMAL HIGH (ref 11.5–14.5)
WBC: 4.7 10*3/uL (ref 3.8–10.6)

## 2017-11-07 LAB — TROPONIN I
TROPONIN I: 0.41 ng/mL — AB (ref <0.03)
TROPONIN I: 0.4 ng/mL — AB (ref <0.03)

## 2017-11-07 MED ORDER — INSULIN ASPART 100 UNIT/ML ~~LOC~~ SOLN: 0.0000 [IU] | Freq: Every day | SUBCUTANEOUS | Status: DC

## 2017-11-07 MED ORDER — INSULIN ASPART 100 UNIT/ML ~~LOC~~ SOLN
0.0000 [IU] | Freq: Three times a day (TID) | SUBCUTANEOUS | Status: DC
  Administered 2017-11-07 – 2017-11-08 (×2): 3 [IU] via SUBCUTANEOUS
  Administered 2017-11-08: 2 [IU] via SUBCUTANEOUS
  Filled 2017-11-07 (×3): qty 1

## 2017-11-07 MED ORDER — FUROSEMIDE 10 MG/ML IJ SOLN
40.0000 mg | Freq: Once | INTRAMUSCULAR | Status: AC
  Administered 2017-11-07: 40 mg via INTRAVENOUS
  Filled 2017-11-07: qty 4

## 2017-11-07 MED ORDER — SODIUM CHLORIDE 0.9% FLUSH
3.0000 mL | Freq: Two times a day (BID) | INTRAVENOUS | Status: DC
  Administered 2017-11-07 – 2017-11-09 (×4): 3 mL via INTRAVENOUS

## 2017-11-07 MED ORDER — DILTIAZEM HCL ER COATED BEADS 180 MG PO CP24
180.0000 mg | ORAL_CAPSULE | Freq: Every day | ORAL | Status: DC
  Administered 2017-11-09: 180 mg via ORAL
  Filled 2017-11-07 (×2): qty 1

## 2017-11-07 MED ORDER — DILTIAZEM HCL ER 60 MG PO CP12
60.0000 mg | ORAL_CAPSULE | Freq: Once | ORAL | Status: AC
  Administered 2017-11-07: 60 mg via ORAL
  Filled 2017-11-07: qty 1

## 2017-11-07 MED ORDER — SILDENAFIL CITRATE 20 MG PO TABS
20.0000 mg | ORAL_TABLET | Freq: Three times a day (TID) | ORAL | Status: DC
  Administered 2017-11-07 – 2017-11-09 (×6): 20 mg via ORAL
  Filled 2017-11-07 (×7): qty 1

## 2017-11-07 NOTE — Plan of Care (Signed)
  Progressing Education: Knowledge of General Education information will improve 11/07/2017 0211 - Progressing by Stefan Churchogers, Roylee Chaffin M, RN

## 2017-11-07 NOTE — Consult Note (Signed)
Reason for Consult: Irregular heartbeat atrial fibrillation tachycardia Referring Physician: Dr. [facility] Cardiologist [facility]  Austin [name] is an 76 y.o. male.  HPI: Patient 76 year old black male history of multiple medical problems chronic atrial fibrillation rate controlled with Cardizem in the past poor anticoagulation candidate because of GI bleeding history of congestive heart failure combined systolic and diastolic dysfunction.  Diabetes history of DVT gastric ulcer GERD hyperlipidemia hypertension moderate to severe pulmonary hypertension still smokes hypoxemia on home O2 presents because of rapid heart rate after being seen by his primary physician.  Patient is on supplemental oxygen use inhalers complains of persistent dyspnea shortness of breath.  Patient complains of palpitations and tachycardia.  He denies any buccal spells or syncope.  Patient has had a history of pulmonary hypertension previous pulmonary embolism and chronic right-sided heart failure complained of persistent dyspnea shortness of breath she presented to the emergency room was admitted with rapid atrial fibrillation and dyspnea.  Past Medical History:  Diagnosis Date  . Acute lower UTI 07/16/2017  . Asthma   . Atrial fibrillation ([place])   . CHF (congestive heart failure) ([place])   . Chronic atrial fibrillation ([place])   . Chronic right-sided CHF (congestive heart failure) ([place]) 07/21/2017  . COPD (chronic obstructive pulmonary disease) ([place])   . Diabetes mellitus without complication ([place])   . DVT (deep venous thrombosis) ([place])   . Gastric ulcer   . GERD (gastroesophageal reflux disease)   . Hypercholesteremia   . Hypertension   . Moderate to severe pulmonary hypertension (HCC)--per 2-D echo 07/14/2017 07/15/2017  . PE (pulmonary embolism)   . Sepsis due to undetermined organism with acute respiratory failure ([place]) 07/13/2017    Past Surgical History:  Procedure Laterality Date  . HERNIA REPAIR    .  PERIPHERAL VASCULAR CATHETERIZATION N/A 11/03/2015   Procedure: IVC Filter Insertion;  Surgeon: Algernon Huxley, MD;  Location: [place] CV LAB;  Service: Cardiovascular;  Laterality: N/A;  . PERIPHERAL VASCULAR CATHETERIZATION N/A 07/24/2016   Procedure: IVC Filter Removal;  Surgeon: Algernon Huxley, MD;  Location: [place] CV LAB;  Service: Cardiovascular;  Laterality: N/A;  . PROSTATE SURGERY      Family History  Problem Relation Age of Onset  . CAD Brother   . Congestive Heart Failure Brother     Social History:  reports that he has been smoking cigarettes.  He has a 45.00 pack-year smoking history. he has never used smokeless tobacco. He reports that he does not drink alcohol or use drugs.  Allergies:  Allergies  Allergen Reactions  . Contrast Media [Iodinated Diagnostic Agents] Nausea And Vomiting  . Other Other (See Comments)    Cannot have "high doses of anesthesia because of his lungs"    Medications: I have reviewed the patient's current medications.  Results for orders placed or performed during the hospital encounter of 11/06/17 (from the past 48 hour(s))  CBC with Differential     Status: Abnormal   Collection Time: 11/06/17  4:39 PM  Result Value Ref Range   WBC 6.5 3.8 - 10.6 K/uL   RBC 4.82 4.40 - 5.90 MIL/uL   Hemoglobin 14.5 13.0 - 18.0 g/dL   HCT 45.9 40.0 - 52.0 %   MCV 95.2 80.0 - 100.0 fL   MCH 30.0 26.0 - 34.0 pg   MCHC 31.5 (L) 32.0 - 36.0 g/dL   RDW 16.7 (H) 11.5 - 14.5 %   Platelets 199 150 - 440 K/uL   Neutrophils Relative %  80 %   Neutro Abs 5.2 1.4 - 6.5 K/uL   Lymphocytes Relative 11 %   Lymphs Abs 0.7 (L) 1.0 - 3.6 K/uL   Monocytes Relative 7 %   Monocytes Absolute 0.4 0.2 - 1.0 K/uL   Eosinophils Relative 1 %   Eosinophils Absolute 0.0 0 - 0.7 K/uL   Basophils Relative 1 %   Basophils Absolute 0.0 0 - 0.1 K/uL  Basic metabolic panel     Status: Abnormal   Collection Time: 11/06/17  4:39 PM  Result Value Ref Range   Sodium 139 135 -  145 mmol/L   Potassium 4.3 3.5 - 5.1 mmol/L   Chloride 104 101 - 111 mmol/L   CO2 20 (L) 22 - 32 mmol/L   Glucose, Bld 201 (H) 65 - 99 mg/dL   BUN 32 (H) 6 - 20 mg/dL   Creatinine, Ser 1.50 (H) 0.61 - 1.24 mg/dL   Calcium 10.1 8.9 - 10.3 mg/dL   GFR calc non Af Amer 44 (L) >60 mL/min   GFR calc Af Amer 51 (L) >60 mL/min    Comment: (NOTE) The eGFR has been calculated using the CKD EPI equation. This calculation has not been validated in all clinical situations. eGFR's persistently <60 mL/min signify possible Chronic Kidney Disease.    Anion gap 15 5 - 15  Troponin I     Status: Abnormal   Collection Time: 11/06/17  4:39 PM  Result Value Ref Range   Troponin I 0.44 (HH) <0.03 ng/mL    Comment: CRITICAL RESULT CALLED TO, READ BACK BY AND VERIFIED WITH THERESA CLAPP AT 1742 11/06/2017 BY TFK.   Brain natriuretic peptide     Status: Abnormal   Collection Time: 11/06/17  7:45 PM  Result Value Ref Range   B Natriuretic Peptide 1,231.0 (H) 0.0 - 100.0 pg/mL  Troponin I     Status: Abnormal   Collection Time: 11/06/17  7:45 PM  Result Value Ref Range   Troponin I 0.41 (HH) <0.03 ng/mL    Comment: CRITICAL VALUE NOTED. VALUE IS CONSISTENT WITH PREVIOUSLY REPORTED/CALLED VALUE.  TFK  Glucose, capillary     Status: Abnormal   Collection Time: 11/06/17  9:22 PM  Result Value Ref Range   Glucose-Capillary 130 (H) 65 - 99 mg/dL   Comment 1 Notify RN    Comment 2 Document in Chart   Troponin I     Status: Abnormal   Collection Time: 11/07/17  1:31 AM  Result Value Ref Range   Troponin I 0.41 (HH) <0.03 ng/mL    Comment: CRITICAL VALUE NOTED. VALUE IS CONSISTENT WITH PREVIOUSLY REPORTED/CALLED VALUE ALV  Basic metabolic panel     Status: Abnormal   Collection Time: 11/07/17  7:36 AM  Result Value Ref Range   Sodium 137 135 - 145 mmol/L   Potassium 4.0 3.5 - 5.1 mmol/L   Chloride 106 101 - 111 mmol/L   CO2 22 22 - 32 mmol/L   Glucose, Bld 89 65 - 99 mg/dL   BUN 26 (H) 6 - 20 mg/dL    Creatinine, Ser 1.35 (H) 0.61 - 1.24 mg/dL   Calcium 9.8 8.9 - 10.3 mg/dL   GFR calc non Af Amer 50 (L) >60 mL/min   GFR calc Af Amer 58 (L) >60 mL/min    Comment: (NOTE) The eGFR has been calculated using the CKD EPI equation. This calculation has not been validated in all clinical situations. eGFR's persistently <60 mL/min signify possible Chronic Kidney Disease.  Anion gap 9 5 - 15  CBC     Status: Abnormal   Collection Time: 11/07/17  7:36 AM  Result Value Ref Range   WBC 4.7 3.8 - 10.6 K/uL   RBC 4.47 4.40 - 5.90 MIL/uL   Hemoglobin 13.4 13.0 - 18.0 g/dL   HCT 41.7 40.0 - 52.0 %   MCV 93.4 80.0 - 100.0 fL   MCH 30.0 26.0 - 34.0 pg   MCHC 32.1 32.0 - 36.0 g/dL   RDW 16.9 (H) 11.5 - 14.5 %   Platelets 173 150 - 440 K/uL  Troponin I     Status: Abnormal   Collection Time: 11/07/17  7:36 AM  Result Value Ref Range   Troponin I 0.40 (HH) <0.03 ng/mL    Comment: CRITICAL VALUE NOTED. VALUE IS CONSISTENT WITH PREVIOUSLY REPORTED/CALLED VALUE. QSD  Glucose, capillary     Status: None   Collection Time: 11/07/17  7:39 AM  Result Value Ref Range   Glucose-Capillary 79 65 - 99 mg/dL    Dg Chest 2 View  Result Date: 11/06/2017 CLINICAL DATA:  Shortness of breath EXAM: CHEST  2 VIEW COMPARISON:  07/20/2017 FINDINGS: Chronic interstitial coarsening that is likely from patient's COPD/emphysema. No generalized Kerley lines. There is persistent posterior costophrenic sulcus blunting and pleural fluid. Chronic cardiomegaly. Stable mediastinal contours. Patient has known pulmonary nodules by chest CT 07/13/2017. A nipple shadow is seen on the right today. IMPRESSION: 1. No acute finding when compared to prior. 2. Trace pleural fluid/atelectasis at the bases that was also seen previously. 3. Cardiomegaly and congested central vessels. Question pulmonary hypertension. 4. Emphysema. Electronically Signed   By: Monte Fantasia M.D.   On: 11/06/2017 17:58    Review of Systems   Constitutional: Positive for malaise/fatigue.  HENT: Positive for congestion.   Eyes: Negative.   Respiratory: Positive for shortness of breath.   Cardiovascular: Positive for palpitations, orthopnea and PND.  Gastrointestinal: Negative.   Genitourinary: Negative.   Musculoskeletal: Positive for myalgias.  Skin: Negative.   Neurological: Positive for dizziness and weakness.  Endo/Heme/Allergies: Negative.   Psychiatric/Behavioral: Negative.    Blood pressure 111/77, pulse (!) 118, temperature 97.8 F (36.6 C), temperature source Oral, resp. rate 19, height _0  (1.753 m), weight 182 lb 14.4 oz (83 kg), SpO2 93 %. Physical Exam  Nursing note and vitals reviewed. Constitutional: He is oriented to person, place, and time. He appears well-developed and well-nourished.  HENT:  Head: Normocephalic and atraumatic.  Eyes: Conjunctivae and EOM are normal. Pupils are equal, round, and reactive to light.  Neck: Normal range of motion. Neck supple.  Cardiovascular: S1 normal, S2 normal and normal pulses. An irregularly irregular rhythm present. Tachycardia present. Exam reveals gallop and S3.  Murmur heard.  Systolic murmur is present with a grade of 2/6. Respiratory: He has decreased breath sounds. He has rhonchi.  GI: Soft. Bowel sounds are normal.  Musculoskeletal: Normal range of motion.  Neurological: He is alert and oriented to person, place, and time.  Skin: Skin is warm and dry.  Psychiatric: He has a normal mood and affect.    Assessment/Plan: Atrial fibrillation rapid ventricular response Right bundle branch block chronic Severe COPD Hypoxemia History of GI bleeding Chronic renal insufficiency Sick sinus syndrome History of syncope Smoking Congestive heart failure . Plan Agree with admission to telemetry Diuretic therapy heart failure therapy Increase Cardizem to reduce heart rate to less than 100 Do not recommend anticoagulation because of previous GI  bleeding Continue  supplemental oxygen Advised patient to quit smoking We will try to avoid placement of permanent pacemaker Continue inhalers as necessary Recommend hydration Refer patient to nephrology for renal insufficiency Consider physical therapy Patient follow-up with cardiology as an outpatient If rate very difficult to control would consider low-dose digoxin  Austin [name] 11/07/2017, 9:25 AM

## 2017-11-07 NOTE — Progress Notes (Signed)
[employer] - [place] [facility]   PATIENT NAME: Austin [name]    MR#:  [id number]  DATE OF BIRTH:  1941-11-03  SUBJECTIVE:  CHIEF COMPLAINT:   Chief Complaint  Patient presents with  . Palpitations  feels some palpitation REVIEW OF SYSTEMS:  Review of Systems  Constitutional: Negative for chills, fever and weight loss.  HENT: Negative for nosebleeds and sore throat.   Eyes: Negative for blurred vision.  Respiratory: Negative for cough, shortness of breath and wheezing.   Cardiovascular: Positive for palpitations. Negative for chest pain, orthopnea, leg swelling and PND.  Gastrointestinal: Negative for abdominal pain, constipation, diarrhea, heartburn, nausea and vomiting.  Genitourinary: Negative for dysuria and urgency.  Musculoskeletal: Negative for back pain.  Skin: Negative for rash.  Neurological: Negative for dizziness, speech change, focal weakness and headaches.  Endo/Heme/Allergies: Does not bruise/bleed easily.  Psychiatric/Behavioral: Negative for depression.    DRUG ALLERGIES:   Allergies  Allergen Reactions  . Contrast Media [Iodinated Diagnostic Agents] Nausea And Vomiting  . Other Other (See Comments)    Cannot have "high doses of anesthesia because of his lungs"   VITALS:  Blood pressure 111/77, pulse (!) 118, temperature 97.8 F (36.6 C), temperature source Oral, resp. rate 19, height 5\' 9"  (1.753 m), weight 83 kg (182 lb 14.4 oz), SpO2 93 %. PHYSICAL EXAMINATION:  Physical Exam  Constitutional: He is oriented to person, place, and time and well-developed, well-nourished, and in no distress.  HENT:  Head: Normocephalic and atraumatic.  Eyes: Conjunctivae and EOM are normal. Pupils are equal, round, and reactive to light.  Neck: Normal range of motion. Neck supple. No tracheal deviation present. No thyromegaly present.  Cardiovascular: Normal heart sounds. An irregularly irregular rhythm present. Tachycardia present.    Pulmonary/Chest: Effort normal and breath sounds normal. No respiratory distress. He has no wheezes. He exhibits no tenderness.  Abdominal: Soft. Bowel sounds are normal. He exhibits no distension. There is no tenderness.  Musculoskeletal: Normal range of motion.  Neurological: He is alert and oriented to person, place, and time. No cranial nerve deficit.  Skin: Skin is warm and dry. No rash noted.  Psychiatric: Mood and affect normal.   LABORATORY PANEL:  Male CBC Recent Labs  Lab 11/07/17 0736  WBC 4.7  HGB 13.4  HCT 41.7  PLT 173   ------------------------------------------------------------------------------------------------------------------ Chemistries  Recent Labs  Lab 11/07/17 0736  NA 137  K 4.0  CL 106  CO2 22  GLUCOSE 89  BUN 26*  CREATININE 1.35*  CALCIUM 9.8   RADIOLOGY:  Dg Chest 2 View  Result Date: 11/06/2017 CLINICAL DATA:  Shortness of breath EXAM: CHEST  2 VIEW COMPARISON:  07/20/2017 FINDINGS: Chronic interstitial coarsening that is likely from patient's COPD/emphysema. No generalized Kerley lines. There is persistent posterior costophrenic sulcus blunting and pleural fluid. Chronic cardiomegaly. Stable mediastinal contours. Patient has known pulmonary nodules by chest CT 07/13/2017. A nipple shadow is seen on the right today. IMPRESSION: 1. No acute finding when compared to prior. 2. Trace pleural fluid/atelectasis at the bases that was also seen previously. 3. Cardiomegaly and congested central vessels. Question pulmonary hypertension. 4. Emphysema. Electronically Signed   By: Marnee SpringJonathon  Watts M.D.   On: 11/06/2017 17:58   ASSESSMENT AND PLAN:  76 year old male with past medical history of chronic atrial fibrillation, previous history of GI bleed, history of DVT status post IVC filter, essential hypertension, diabetes who presents to the hospital due to palpitations and noted to be in atrial  fibrillation/flutter.  1. Atrial  fibrillation/flutter-patient has history of chronic A. fib but his heart rates were somewhat labile at his primary care physician's office. -Continue his Cardizem, metoprolol  - increase dose of cardizem to 180 mg, cardio in agreement  2. Elevated troponin-suspected to be secondary to supply demand ischemia from his A. fib with RVR.  3. Essential hypertension-continue metoprolol, Cardizem  4. Diabetes type 2 without complication-continue glipizide, hold metformin for now given some mild acute kidney injury. Follow blood sugars.  5. Hyperlipidemia-continue Pravachol.  6. COPD-no acute exacerbation. Continue Symbicort, albuterol inhaler as needed.       All the records are reviewed and case discussed with Care Management/Social Worker. Management plans discussed with the patient, Dr Juliann Paresallwood, family (talk to wife over phone), nursing and they are in agreement.  CODE STATUS: Full Code  TOTAL TIME TAKING CARE OF THIS PATIENT: 35 minutes.   More than 50% of the time was spent in counseling/coordination of care: YES  POSSIBLE D/C IN 1 DAYS, DEPENDING ON CLINICAL CONDITION.   Austin [name] M.D on 11/07/2017 at 4:32 PM  Between 7am to 6pm - Pager - [phone]  After 6pm go to [email or web address] - Scientist, research (life sciences)password EPAS [facility]  [facility] [place] [facility]  Office  [phone]  CC: Primary care physician; [facility]  Note: This dictation was prepared with Dragon dictation along with smaller phrase technology. Any transcriptional errors that result from this process are unintentional.

## 2017-11-07 NOTE — Progress Notes (Signed)
Inpatient Diabetes Program Recommendations  AACE/ADA: New Consensus Statement on Inpatient Glycemic Control (2015)  Target Ranges:  Prepandial:   less than 140 mg/dL      Peak postprandial:   less than 180 mg/dL (1-2 hours)      Critically ill patients:  140 - 180 mg/dL   Results for Benito MccreedySELLARS, Quenton ALLEN (MRN [id number]) as of 11/07/2017 09:21  Ref. Range 11/06/2017 21:22 11/07/2017 07:39  Glucose-Capillary Latest Ref Range: 65 - 99 mg/dL 213130 (H) 79    Admit with: A Fib/ Flutter  History: DM, CHF, COPD  Home DM Meds: Glipizide 5 mg BID       Metformin 500 mg BID  Current Insulin Orders: Glipizide 5 mg BID      Novolog Moderate Correction Scale/ SSI (0-15 units) TID AC + HS        MD- May consider discontinuing Glipizide for now and managing with Novolog alone to start.  CBGs OK so far.      --Will follow patient during hospitalization--  Ambrose FinlandJeannine Johnston Muhsin Doris RN, MSN, CDE Diabetes Coordinator Inpatient Glycemic Control Team Team Pager: [phone] (8a-5p)

## 2017-11-07 NOTE — Care Management Obs Status (Addendum)
MEDICARE OBSERVATION STATUS NOTIFICATION   Patient Details  Name: Austin [name] MRN: [id number] Date of Birth: 12-05-1941   Medicare Observation Status Notification Given:  Yes Explained notice to patient and he does not understand any aspect of the explanation.  his wife will be on the unit this afternoon after work.  left notice in the room with CM contact information    11.29- Have left instruction for wife to contact CM regarding obs notice.  Primary nurse will notify CM if wife comes to unit   11/29 16:57: Spoke with Merry Proudhristine Mane on the phone about observation notice.  CM addressed questions abut the medications not being covered.  She will sign the note when she comes to the hospital tomorrow  Eber HongGreene, Kendal Ghazarian R, RN 11/07/2017, 2:56 PM

## 2017-11-07 NOTE — Progress Notes (Signed)
Pt arrived vis stretcher from ED. Pt A&O. Telemetry monitor applied and called to CCMD. Booklet given, yellow socks placed. Oriented to room, call bell system, and ascom phones.

## 2017-11-07 NOTE — Care Management Note (Signed)
Case Management Note  Patient Details  Name: Benito MccreedyJames Allen Tarr MRN: [id number] Date of Birth: 03-01-1941  Subjective/Objective:                 Placed in observation from home for artrial fib.  Patient has oxygen at home but does not know the agency and says he had home health about one month ago.  History of pulmonary embolus, and has been on Eliquis.  Does not appear he is on  Eliquis now. [facility] confirms is providing home oxygen and order is for continuous.  [facility] provided home health two years ago.   Action/Plan:  Reached out to wife to clarify care needs, disposition and explain observation notice and obtain signature  Expected Discharge Date:                  Expected Discharge Plan:     In-House Referral:     Discharge planning Services     Post Acute Care Choice:    Choice offered to:     DME Arranged:    DME Agency:     HH Arranged:    HH Agency:     Status of Service:     If discussed at [employer] Length of [employer] Meetings, dates discussed:    Additional Comments:  Eber HongGreene, Donne Baley R, RN 11/07/2017, 2:58 PM

## 2017-11-07 NOTE — Progress Notes (Signed)
Notified MD of pt with coarse crackles, sats @ 91% and pt stating that," can hardly breathe". Orders placed. Will continue to monitor and assess.

## 2017-11-08 DIAGNOSIS — E785 Hyperlipidemia, unspecified: Secondary | ICD-10-CM | POA: Diagnosis present

## 2017-11-08 DIAGNOSIS — Z79899 Other long term (current) drug therapy: Secondary | ICD-10-CM | POA: Diagnosis not present

## 2017-11-08 DIAGNOSIS — I4891 Unspecified atrial fibrillation: Secondary | ICD-10-CM | POA: Diagnosis present

## 2017-11-08 DIAGNOSIS — Z7982 Long term (current) use of aspirin: Secondary | ICD-10-CM | POA: Diagnosis not present

## 2017-11-08 DIAGNOSIS — I272 Pulmonary hypertension, unspecified: Secondary | ICD-10-CM | POA: Diagnosis present

## 2017-11-08 DIAGNOSIS — Z8249 Family history of ischemic heart disease and other diseases of the circulatory system: Secondary | ICD-10-CM | POA: Diagnosis not present

## 2017-11-08 DIAGNOSIS — Z7984 Long term (current) use of oral hypoglycemic drugs: Secondary | ICD-10-CM | POA: Diagnosis not present

## 2017-11-08 DIAGNOSIS — Z86711 Personal history of pulmonary embolism: Secondary | ICD-10-CM | POA: Diagnosis not present

## 2017-11-08 DIAGNOSIS — I482 Chronic atrial fibrillation: Secondary | ICD-10-CM | POA: Diagnosis present

## 2017-11-08 DIAGNOSIS — I4892 Unspecified atrial flutter: Secondary | ICD-10-CM | POA: Diagnosis present

## 2017-11-08 DIAGNOSIS — Z7951 Long term (current) use of inhaled steroids: Secondary | ICD-10-CM | POA: Diagnosis not present

## 2017-11-08 DIAGNOSIS — Z9981 Dependence on supplemental oxygen: Secondary | ICD-10-CM | POA: Diagnosis not present

## 2017-11-08 DIAGNOSIS — Z86718 Personal history of other venous thrombosis and embolism: Secondary | ICD-10-CM | POA: Diagnosis not present

## 2017-11-08 DIAGNOSIS — I4510 Unspecified right bundle-branch block: Secondary | ICD-10-CM | POA: Diagnosis present

## 2017-11-08 DIAGNOSIS — I495 Sick sinus syndrome: Secondary | ICD-10-CM | POA: Diagnosis present

## 2017-11-08 DIAGNOSIS — R002 Palpitations: Secondary | ICD-10-CM | POA: Diagnosis present

## 2017-11-08 DIAGNOSIS — I130 Hypertensive heart and chronic kidney disease with heart failure and stage 1 through stage 4 chronic kidney disease, or unspecified chronic kidney disease: Secondary | ICD-10-CM | POA: Diagnosis present

## 2017-11-08 DIAGNOSIS — J449 Chronic obstructive pulmonary disease, unspecified: Secondary | ICD-10-CM | POA: Diagnosis present

## 2017-11-08 DIAGNOSIS — F17210 Nicotine dependence, cigarettes, uncomplicated: Secondary | ICD-10-CM | POA: Diagnosis present

## 2017-11-08 DIAGNOSIS — I5042 Chronic combined systolic (congestive) and diastolic (congestive) heart failure: Secondary | ICD-10-CM | POA: Diagnosis present

## 2017-11-08 DIAGNOSIS — I248 Other forms of acute ischemic heart disease: Secondary | ICD-10-CM | POA: Diagnosis present

## 2017-11-08 DIAGNOSIS — R0902 Hypoxemia: Secondary | ICD-10-CM | POA: Diagnosis present

## 2017-11-08 DIAGNOSIS — K219 Gastro-esophageal reflux disease without esophagitis: Secondary | ICD-10-CM | POA: Diagnosis present

## 2017-11-08 DIAGNOSIS — N179 Acute kidney failure, unspecified: Secondary | ICD-10-CM | POA: Diagnosis present

## 2017-11-08 DIAGNOSIS — E1122 Type 2 diabetes mellitus with diabetic chronic kidney disease: Secondary | ICD-10-CM | POA: Diagnosis present

## 2017-11-08 DIAGNOSIS — E7800 Pure hypercholesterolemia, unspecified: Secondary | ICD-10-CM | POA: Diagnosis present

## 2017-11-08 LAB — GLUCOSE, CAPILLARY
GLUCOSE-CAPILLARY: 198 mg/dL — AB (ref 65–99)
GLUCOSE-CAPILLARY: 64 mg/dL — AB (ref 65–99)
GLUCOSE-CAPILLARY: 161 mg/dL — AB (ref 65–99)
Glucose-Capillary: 131 mg/dL — ABNORMAL HIGH (ref 65–99)
Glucose-Capillary: 63 mg/dL — ABNORMAL LOW (ref 65–99)
Glucose-Capillary: 58 mg/dL — ABNORMAL LOW (ref 65–99)
Glucose-Capillary: 94 mg/dL (ref 65–99)

## 2017-11-08 LAB — CBC
HEMATOCRIT: 40.4 % (ref 40.0–52.0)
HEMOGLOBIN: 13 g/dL (ref 13.0–18.0)
MCH: 30 pg (ref 26.0–34.0)
MCHC: 32.2 g/dL (ref 32.0–36.0)
MCV: 93.2 fL (ref 80.0–100.0)
PLATELETS: 162 10*3/uL (ref 150–440)
RBC: 4.34 MIL/uL — ABNORMAL LOW (ref 4.40–5.90)
RDW: 16.6 % — ABNORMAL HIGH (ref 11.5–14.5)
WBC: 5.1 10*3/uL (ref 3.8–10.6)

## 2017-11-08 LAB — BASIC METABOLIC PANEL
ANION GAP: 5 (ref 5–15)
BUN: 30 mg/dL — AB (ref 6–20)
CHLORIDE: 106 mmol/L (ref 101–111)
CO2: 24 mmol/L (ref 22–32)
Calcium: 9.4 mg/dL (ref 8.9–10.3)
Creatinine, Ser: 1.46 mg/dL — ABNORMAL HIGH (ref 0.61–1.24)
GFR calc Af Amer: 52 mL/min — ABNORMAL LOW (ref >60)
GFR, EST NON AFRICAN AMERICAN: 45 mL/min — AB (ref >60)
GLUCOSE: 172 mg/dL — AB (ref 65–99)
POTASSIUM: 4.2 mmol/L (ref 3.5–5.1)
Sodium: 135 mmol/L (ref 135–145)

## 2017-11-08 MED ORDER — DIGOXIN 125 MCG PO TABS
0.1250 mg | ORAL_TABLET | Freq: Once | ORAL | Status: AC
  Administered 2017-11-08: 0.125 mg via ORAL
  Filled 2017-11-08: qty 1

## 2017-11-08 MED ORDER — DIGOXIN 125 MCG PO TABS
0.1250 mg | ORAL_TABLET | ORAL | Status: AC
  Administered 2017-11-08: 0.125 mg via ORAL
  Filled 2017-11-08: qty 1

## 2017-11-08 NOTE — Progress Notes (Signed)
Inpatient Diabetes Program Recommendations  AACE/ADA: New Consensus Statement on Inpatient Glycemic Control (2015)  Target Ranges:  Prepandial:   less than 140 mg/dL      Peak postprandial:   less than 180 mg/dL (1-2 hours)      Critically ill patients:  140 - 180 mg/dL   Lab Results  Component Value Date   GLUCAP 131 (H) 11/08/2017   HGBA1C 7.9 (H) 07/16/2017    Review of Glycemic Control  Results for Austin [name], [name] (MRN [id number]) as of 11/08/2017 10:19  Ref. Range 11/07/2017 07:39 11/07/2017 11:05 11/07/2017 16:28 11/07/2017 20:42 11/08/2017 08:22  Glucose-Capillary Latest Ref Range: 65 - 99 mg/dL 79 409175 (H) 90 811157 (H) 914131 (H)    History: Type 2  Home DM Meds: Glipizide 5 mg BID                             Metformin 500 mg BID  Current Insulin Orders: Glipizide 5 mg BID                                       Novolog Moderate Correction Scale/ SSI (0-15 units) TID AC, Novolog 0-5 units qhs  Consider discontinuing Glipizide for now and managing with Novolog alone . High risk for hypoglycemia in hospital.  Susette RacerJulie Marjon Doxtater, RN, BA, MHA, CDE Diabetes Coordinator Inpatient Diabetes Program  [phone] (Team Pager) [phone] [facility] Office) 11/08/2017 10:20 AM

## 2017-11-08 NOTE — Progress Notes (Signed)
MD Anne HahnWillis made aware of patients HR sustaining 120s, BP 84/58, and oxygen saturations 91 on 3L. Patient asymptomatic, states that he feels fine. MD to place orders. Will continue to monitor.

## 2017-11-08 NOTE — Plan of Care (Signed)
  Progressing Education: Knowledge of General Education information will improve 11/08/2017 0319 - Progressing by Dorna LeitzNesbitt, Elienai Gailey M, RN Safety: Ability to remain free from injury will improve 11/08/2017 0319 - Progressing by Dorna LeitzNesbitt, Mylinda Brook M, RN Cardiac: Ability to achieve and maintain adequate cardiopulmonary perfusion will improve 11/08/2017 0319 - Progressing by Dorna LeitzNesbitt, Corrisa Gibby M, RN

## 2017-11-08 NOTE — Care Management Note (Signed)
Case Management Note  Patient Details  Name: Austin [name] MRN: [id number] Date of Birth: 1941/10/10  Subjective/Objective:                 [facility] past.  If requires home health, this is the agency preference. Wife verbalizes to bring portable oxygen for transport home.   Action/Plan:   Expected Discharge Date:                  Expected Discharge Plan:     In-House Referral:     Discharge planning Services     Post Acute Care Choice:    Choice offered to:     DME Arranged:    DME Agency:     HH Arranged:    HH Agency:     Status of Service:     If discussed at [employer] Length of Stay Meetings, dates discussed:    Additional Comments:  Eber HongGreene, Kindred Reidinger R, RN 11/08/2017, 5:01 PM

## 2017-11-08 NOTE — Progress Notes (Signed)
Hypoglycemic Event  CBG: 64  Treatment: 15 GM carbohydrate snack 4 oz soda  Symptoms: none  Follow-up CBG: Time:5:30 CBG Result:64  Possible Reasons for Event: Inadequate meal intake   Comments/MD notified:will give 4 oz juice, and follow up with repeat CBG in 15 minutes, pt asymptomatic, dinner on the way, Follow up CBG 94.     Krysten Veronica D Josalynn Johndrow

## 2017-11-08 NOTE — Progress Notes (Signed)
[facility] - [place] [facility]   PATIENT NAME: Austin [name]    MR#:  [id number]  DATE OF BIRTH:  06-28-1941  SUBJECTIVE:  CHIEF COMPLAINT:   Chief Complaint  Patient presents with  . Palpitations  not feeling well, hypotensive, tachycardic REVIEW OF SYSTEMS:  Review of Systems  Constitutional: Negative for chills, fever and weight loss.  HENT: Negative for nosebleeds and sore throat.   Eyes: Negative for blurred vision.  Respiratory: Negative for cough, shortness of breath and wheezing.   Cardiovascular: Positive for palpitations. Negative for chest pain, orthopnea, leg swelling and PND.  Gastrointestinal: Negative for abdominal pain, constipation, diarrhea, heartburn, nausea and vomiting.  Genitourinary: Negative for dysuria and urgency.  Musculoskeletal: Negative for back pain.  Skin: Negative for rash.  Neurological: Negative for dizziness, speech change, focal weakness and headaches.  Endo/Heme/Allergies: Does not bruise/bleed easily.  Psychiatric/Behavioral: Negative for depression.   DRUG ALLERGIES:   Allergies  Allergen Reactions  . Contrast Media [Iodinated Diagnostic Agents] Nausea And Vomiting  . Other Other (See Comments)    Cannot have "high doses of anesthesia because of his lungs"   VITALS:  Blood pressure (!) 87/60, pulse (!) 118, temperature 97.7 F (36.5 C), temperature source Oral, resp. rate 20, height 5\' 9"  (1.753 m), weight 83.5 kg (184 lb 1.6 oz), SpO2 95 %. PHYSICAL EXAMINATION:  Physical Exam  Constitutional: He is oriented to person, place, and time and well-developed, well-nourished, and in no distress.  HENT:  Head: Normocephalic and atraumatic.  Eyes: Conjunctivae and EOM are normal. Pupils are equal, round, and reactive to light.  Neck: Normal range of motion. Neck supple. No tracheal deviation present. No thyromegaly present.  Cardiovascular: Normal heart sounds. An irregularly irregular rhythm present. Tachycardia  present.  Pulmonary/Chest: Effort normal and breath sounds normal. No respiratory distress. He has no wheezes. He exhibits no tenderness.  Abdominal: Soft. Bowel sounds are normal. He exhibits no distension. There is no tenderness.  Musculoskeletal: Normal range of motion.  Neurological: He is alert and oriented to person, place, and time. No cranial nerve deficit.  Skin: Skin is warm and dry. No rash noted.  Psychiatric: Mood and affect normal.   LABORATORY PANEL:  Male CBC Recent Labs  Lab 11/08/17 0414  WBC 5.1  HGB 13.0  HCT 40.4  PLT 162   ------------------------------------------------------------------------------------------------------------------ Chemistries  Recent Labs  Lab 11/08/17 0414  NA 135  K 4.2  CL 106  CO2 24  GLUCOSE 172*  BUN 30*  CREATININE 1.46*  CALCIUM 9.4   RADIOLOGY:  No results found. ASSESSMENT AND PLAN:  76 year old male with past medical history of chronic atrial fibrillation, previous history of GI bleed, history of DVT status post IVC filter, essential hypertension, diabetes who presents to the hospital due to palpitations and noted to be in atrial fibrillation/flutter.  1. Atrial fibrillation/flutter-patient has history of chronic A. fib but his heart rates were somewhat labile at his primary care physician's office. -Continue his Cardizem, Hold metoprolol  - give extra dose of digoxin  2. Elevated troponin-suspected to be secondary to supply demand ischemia from his A. fib with RVR.  3. Essential hypertension-continue metoprolol, Cardizem  4. Diabetes type 2 without complication-Hold glipizide and metformin for now. SSI  5. Hyperlipidemia-continue Pravachol.  6. COPD-no acute exacerbation. Continue Symbicort, albuterol inhaler as needed.       All the records are reviewed and case discussed with Care Management/Social Worker. Management plans discussed with the patient, Dr Juliann Paresallwood,  family (talk to wife over phone),  nursing and they are in agreement.  CODE STATUS: Full Code  TOTAL TIME TAKING CARE OF THIS PATIENT: 35 minutes.   More than 50% of the time was spent in counseling/coordination of care: YES  POSSIBLE D/C IN 1 DAYS, DEPENDING ON CLINICAL CONDITION.   Austin [name] M.D on 11/08/2017 at 3:20 PM  Between 7am to 6pm - Pager - [phone]  After 6pm go to [email or web address] - Scientist, research (life sciences)password EPAS [facility]  [facility] [place] [facility]  Office  [phone]  CC: Primary care physician; [facility]  Note: This dictation was prepared with Dragon dictation along with smaller phrase technology. Any transcriptional errors that result from this process are unintentional.

## 2017-11-09 LAB — GLUCOSE, CAPILLARY: Glucose-Capillary: 114 mg/dL — ABNORMAL HIGH (ref 65–99)

## 2017-11-09 MED ORDER — DIGOXIN 125 MCG PO TABS
0.1250 mg | ORAL_TABLET | ORAL | Status: AC
  Administered 2017-11-09: 0.125 mg via ORAL
  Filled 2017-11-09: qty 1

## 2017-11-09 MED ORDER — DIGOXIN 125 MCG PO TABS: 0.1250 mg | ORAL_TABLET | Freq: Every day | ORAL | 0 refills | Status: AC

## 2017-11-09 MED ORDER — DILTIAZEM HCL ER COATED BEADS 180 MG PO CP24: 180.0000 mg | ORAL_CAPSULE | Freq: Every day | ORAL | 0 refills | Status: DC

## 2017-11-09 NOTE — Progress Notes (Signed)
Patient discharged via wheelchair and private vehicle. Tele box off and returned. IV removed. Patients wife has several concerns about medications. Instructed them on checking his bp and heart rate before taking his medications and if he has any concerns about taking them that he should call dr Glennis Brinkallwood's office and speak with the nurse. Verbalizes understanding. Patient stable and does not express any concerns at time of discharge.

## 2017-11-09 NOTE — Plan of Care (Signed)
  Education: Knowledge of General Education information will improve 11/09/2017 1149 - Completed/Met by Gildardo Pounds, RN   Clinical Measurements: Will remain free from infection 11/09/2017 1149 - Completed/Met by Gildardo Pounds, RN

## 2017-11-09 NOTE — Progress Notes (Signed)
Patients heart rate 120's Aflutter on the monitor BP soft. Dr. Sherryll BurgerShah in room and aware stated this is patients baseline and it was ok to discharge patient with a follow up with DR. Callwood. Patient instructed to check BP before taking his cardizem at home and if his SBP is less than 100 he is to hold his dose and recheck it the next day.  Vitals:   11/09/17 1022 11/09/17 1025  BP: 98/69   Pulse: (!) 120 (!) 120  Resp:    Temp:    SpO2:  96%

## 2017-11-09 NOTE — Discharge Instructions (Signed)
Digoxin tablets or capsules What is this medicine? DIGOXIN (di JOX in) is used to treat congestive heart failure and heart rhythm problems. This medicine may be used for other purposes; ask your health care provider or pharmacist if you have questions. COMMON BRAND NAME(S): Digitek, Lanoxicaps, Lanoxin What should I tell my health care provider before I take this medicine? They need to know if you have any of these conditions: -certain heart rhythm disorders -heart disease or recent heart attack -kidney or liver disease -an unusual or allergic reaction to digoxin, other medicines, foods, dyes, or preservatives -pregnant or trying to get pregnant -breast-feeding How should I use this medicine? Take this medicine by mouth with a glass of water. Follow the directions on the prescription label. Take your doses at regular intervals. Do not take your medicine more often than directed. Talk to your pediatrician regarding the use of this medicine in children. Special care may be needed. Overdosage: If you think you have taken too much of this medicine contact a poison control center or emergency room at once. NOTE: This medicine is only for you. Do not share this medicine with others. What if I miss a dose? If you miss a dose, take it as soon as you can. If it is almost time for your next dose, take only that dose. Do not take double or extra doses. What may interact with this medicine? -activated charcoal -albuterol -alprazolam -antacids -antiviral medicines for HIV or AIDS like ritonavir and saquinavir -calcium -certain antibiotics like azithromycin, clarithromycin, erythromycin, gentamicin, neomycin, trimethoprim, and tetracycline -certain medicines for blood pressure, heart disease, irregular heart beat -certain medicines for cancer -certain medicines for cholesterol like atorvastatin, cholestyramine, and colestipol -certain medicines for diabetes, like acarbose, exenatide, miglitol, and  metformin -certain medicines for fungal infections like ketoconazole and itraconazole -certain medicines for stomach problems like omeprazole, esomeprazole, lansoprazole, rabeprazole, metoclopramide, and sucralfate -conivaptan -cyclosporine -diphenoxylate -epinephrine -kaolin; pectin -nefazodone -NSAIDS, medicines for pain and inflammation, like celecoxib, ibuprofen, or naproxen -penicillamine -phenytoin -propantheline -quinine -phenytoin -rifampin -succinylcholine -St. John's Wort -sulfasalazine -teriparatide -thyroid hormones -tolvaptan This list may not describe all possible interactions. Give your health care provider a list of all the medicines, herbs, non-prescription drugs, or dietary supplements you use. Also tell them if you smoke, drink alcohol, or use illegal drugs. Some items may interact with your medicine. What should I watch for while using this medicine? Visit your doctor or health care professional for regular checks on your progress. Do not stop taking this medicine without the advice of your doctor or health care professional, even if you feel better. Do not change the brand you are taking, other brands may affect you differently. Check your heart rate and blood pressure regularly while you are taking this medicine. Ask your doctor or health care professional what your heart rate and blood pressure should be, and when you should contact him or her. Your doctor or health care professional also may schedule regular blood tests and electrocardiograms to check your progress. Watch your diet. Less digoxin may be absorbed from the stomach if you have a diet high in bran fiber. Do not treat yourself for coughs, colds or allergies without asking your doctor or health care professional for advice. Some ingredients can increase possible side effects. What side effects may I notice from receiving this medicine? Side effects that you should report to your doctor or health care  professional as soon as possible: -allergic reactions like skin rash, itching or hives, swelling of  the face, lips, or tongue -changes in behavior, mood, or mental ability -changes in vision -confusion -fast, irregular heartbeat -feeling faint or lightheaded, falls -headache -nausea, vomiting -unusual bleeding, bruising -unusually weak or tired Side effects that usually do not require medical attention (report to your doctor or health care professional if they continue or are bothersome): -breast enlargement in men and women -diarrhea This list may not describe all possible side effects. Call your doctor for medical advice about side effects. You may report side effects to FDA at 1-800-FDA-1088. Where should I keep my medicine? Keep out of the reach of children. Store at room temperature between 15 and 30 degrees C (59 and 86 degrees F). Protect from light and moisture. Throw away any unused medicine after the expiration date. NOTE: This sheet is a summary. It may not cover all possible information. If you have questions about this medicine, talk to your doctor, pharmacist, or health care provider.  2018 Elsevier/Gold Standard (2016-11-15 15:40:26) Atrial Fibrillation Atrial fibrillation is a type of heartbeat that is irregular or fast (rapid). If you have this condition, your heart keeps quivering in a weird (chaotic) way. This condition can make it so your heart cannot pump blood normally. Having this condition gives a person more risk for stroke, heart failure, and other heart problems. There are different types of atrial fibrillation. Talk with your doctor to learn about the type that you have. Follow these instructions at home:  Take over-the-counter and prescription medicines only as told by your doctor.  If your doctor prescribed a blood-thinning medicine, take it exactly as told. Taking too much of it can cause bleeding. If you do not take enough of it, you will not have the  protection that you need against stroke and other problems.  Do not use any tobacco products. These include cigarettes, chewing tobacco, and e-cigarettes. If you need help quitting, ask your doctor.  If you have apnea (obstructive sleep apnea), manage it as told by your doctor.  Do not drink alcohol.  Do not drink beverages that have caffeine. These include coffee, soda, and tea.  Maintain a healthy weight. Do not use diet pills unless your doctor says they are safe for you. Diet pills may make heart problems worse.  Follow diet instructions as told by your doctor.  Exercise regularly as told by your doctor.  Keep all follow-up visits as told by your doctor. This is important. Contact a doctor if:  You notice a change in the speed, rhythm, or strength of your heartbeat.  You are taking a blood-thinning medicine and you notice more bruising.  You get tired more easily when you move or exercise. Get help right away if:  You have pain in your chest or your belly (abdomen).  You have sweating or weakness.  You feel sick to your stomach (nauseous).  You notice blood in your throw up (vomit), poop (stool), or pee (urine).  You are short of breath.  You suddenly have swollen feet and ankles.  You feel dizzy.  Your suddenly get weak or numb in your face, arms, or legs, especially if it happens on one side of your body.  You have trouble talking, trouble understanding, or both.  Your face or your eyelid droops on one side. These symptoms may be an emergency. Do not wait to see if the symptoms will go away. Get medical help right away. Call your local emergency services (911 in the [place] Do not drive yourself to the hospital.  This information is not intended to replace advice given to you by your health care provider. Make sure you discuss any questions you have with your health care provider. Document Released: 09/05/2008 Document Revised: 05/04/2016 Document Reviewed:  03/24/2015 Elsevier Interactive Patient Education  [employer]2018 Elsevier Inc.

## 2017-11-09 NOTE — Progress Notes (Signed)
Inpatient Diabetes Program Recommendations  AACE/ADA: New Consensus Statement on Inpatient Glycemic Control (2015)  Target Ranges:  Prepandial:   less than 140 mg/dL      Peak postprandial:   less than 180 mg/dL (1-2 hours)      Critically ill patients:  140 - 180 mg/dL   Lab Results  Component Value Date   GLUCAP 114 (H) 11/09/2017   HGBA1C 7.9 (H) 07/16/2017    Review of Glycemic Control  Results for Austin [name], Austin [name] (MRN [id number]) as of 11/09/2017 09:10  Ref. Range 11/08/2017 17:34 11/08/2017 17:58 11/08/2017 18:37 11/08/2017 20:58 11/09/2017 07:51  Glucose-Capillary Latest Ref Range: 65 - 99 mg/dL 64 (L) 58 (L) 94 409161 (H) 114 (H)   History:Type 2  Home DM Meds:Glipizide 5 mg BID Metformin 500 mg BID  Current Insulin Orders:Novolog Moderate Correction Scale/ SSI (0-15 units) TID AC, Novolog 0-5 units qhs  Agree with current medications for blood sugar management.   Austin RacerJulie Darris Staiger, RN, BA, MHA, CDE Diabetes Coordinator Inpatient Diabetes Program  [phone] (Team Pager) [phone] [facility] Office) 11/09/2017 9:12 AM

## 2017-11-12 NOTE — Discharge Summary (Signed)
[employer] [facility] [place] [facility]   PATIENT NAME: Austin [name]    MR#:  [id number]  DATE OF BIRTH:  1941-11-24  DATE OF ADMISSION:  11/06/2017   ADMITTING PHYSICIAN: Houston Siren, MD  DATE OF DISCHARGE: 11/09/2017 11:50 AM  PRIMARY CARE PHYSICIAN: [facility]   ADMISSION DIAGNOSIS:  Elevated troponin [R74.8] Atrial flutter, unspecified type (HCC) [I48.92] DISCHARGE DIAGNOSIS:  Active Problems:   Atrial fibrillation and flutter (HCC)   Atrial fibrillation (HCC)  SECONDARY DIAGNOSIS:   Past Medical History:  Diagnosis Date  . Acute lower UTI 07/16/2017  . Asthma   . Atrial fibrillation (HCC)   . CHF (congestive heart failure) (HCC)   . Chronic atrial fibrillation (HCC)   . 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 COPD (chronic obstructive pulmonary disease) (HCC)   . Diabetes mellitus without complication (HCC)   . DVT (deep venous thrombosis) (HCC)   . Gastric ulcer   . GERD (gastroesophageal reflux disease)   . Hypercholesteremia   . Hypertension   . Moderate to severe pulmonary hypertension (HCC)--per 2-D echo 07/14/2017 07/15/2017  . PE (pulmonary embolism)   . Sepsis due to undetermined organism with acute respiratory failure (HCC) 07/13/2017   HOSPITAL COURSE:  76 year old male with past medical history of chronic atrial fibrillation, previous history of GI bleed, history of DVT status post IVC filter, essential hypertension, diabetes admitted to the hospital due to palpitations and noted to be in atrial fibrillation/flutter.  1. Chronic Atrial fibrillation/flutter-patient has history of chronic A. fib but his heart rates always labile per Cardio office.  2. Elevated troponin-suspected to be secondary to supply demand ischemia from his A. fib with RVR.  3.Essential hypertension-continue Cardizem. Stop metoprolol  4. Diabetes type 2 without complication-continue home regimen  5.  Hyperlipidemia-continue Pravachol.  6. COPD-no acute exacerbation. Continue Symbicort, albuterol inhaler as needed. DISCHARGE CONDITIONS:  stable CONSULTS OBTAINED:   DRUG ALLERGIES:   Allergies  Allergen Reactions  . Contrast Media [Iodinated Diagnostic Agents] Nausea And Vomiting  . Other Other (See Comments)    Cannot have "high doses of anesthesia because of his lungs"   DISCHARGE MEDICATIONS:   Allergies as of 11/09/2017      Reactions   Contrast Media [iodinated Diagnostic Agents] Nausea And Vomiting   Other Other (See Comments)   Cannot have "high doses of anesthesia because of his lungs"      Medication List    STOP taking these medications   lisinopril 5 MG tablet Commonly known as:  PRINIVIL,ZESTRIL   metoprolol tartrate 25 MG tablet Commonly known as:  LOPRESSOR     TAKE these medications   aspirin EC 81 MG tablet Take 81 mg by mouth daily.   budesonide-formoterol 160-4.5 MCG/ACT inhaler Commonly known as:  SYMBICORT Inhale 1 puff into the lungs 2 (two) times daily.   digoxin 0.125 MG tablet Commonly known as:  LANOXIN Take 1 tablet (0.125 mg total) by mouth daily.   diltiazem 180 MG 24 hr capsule Commonly known as:  CARDIZEM CD Take 1 capsule (180 mg total) by mouth daily. What changed:    medication strength  how much to take  additional instructions   furosemide 40 MG tablet Commonly known as:  LASIX Take 1 tablet (40 mg total) by mouth 2 (two) times daily.   glipiZIDE 5 MG tablet Commonly known as:  GLUCOTROL Take 1 tablet (5 mg total) by mouth 2 (two) times daily.  DO NOT TAKE THIS DIABETES MEDICINE IF YOUR BLOOD SUGAR IS LESS THAN 130.   levalbuterol 0.63 MG/3ML nebulizer solution Commonly known as:  XOPENEX Take 3 mLs (0.63 mg total) by nebulization every 4 (four) hours as needed for wheezing or shortness of breath.   lovastatin 40 MG tablet Commonly known as:  MEVACOR Take 40 mg by mouth at bedtime. Pt should take with food.     metFORMIN 500 MG tablet Commonly known as:  GLUCOPHAGE Take 500 mg by mouth 2 (two) times daily with a meal.   nitroGLYCERIN 0.4 MG SL tablet Commonly known as:  NITROSTAT Place 0.4 mg under the tongue every 5 (five) minutes x 3 doses as needed for chest pain. If no relief call 911 or go to emergency room.   omeprazole 40 MG capsule Commonly known as:  PRILOSEC Take 40 mg by mouth daily.   polyethylene glycol packet Commonly known as:  MIRALAX Take 17 g by mouth daily as needed for moderate constipation.   PROAIR HFA 108 (90 Base) MCG/ACT inhaler Generic drug:  albuterol Inhale 1-2 puffs into the lungs every 6 (six) hours as needed for wheezing or shortness of breath.   sildenafil 20 MG tablet Commonly known as:  REVATIO Take 1 tablet (20 mg total) by mouth 3 (three) times daily.        DISCHARGE INSTRUCTIONS:   DIET:  Cardiac diet DISCHARGE CONDITION:  Stable ACTIVITY:  Activity as tolerated OXYGEN:  Home Oxygen: No.  Oxygen Delivery: room air DISCHARGE LOCATION:  home   If you experience worsening of your admission symptoms, develop shortness of breath, life threatening emergency, suicidal or homicidal thoughts you must seek medical attention immediately by calling 911 or calling your MD immediately  if symptoms less severe.  You Must read complete instructions/literature along with all the possible adverse reactions/side effects for all the Medicines you take and that have been prescribed to you. Take any new Medicines after you have completely understood and accpet all the possible adverse reactions/side effects.   Please note  You were cared for by a hospitalist during your hospital stay. If you have any questions about your discharge medications or the care you received while you were in the hospital after you are discharged, you can call the unit and asked to speak with the hospitalist on call if the hospitalist that took care of you is not available. Once you  are discharged, your primary care physician will handle any further medical issues. Please note that NO REFILLS for any discharge medications will be authorized once you are discharged, as it is imperative that you return to your primary care physician (or establish a relationship with a primary care physician if you do not have one) for your aftercare needs so that they can reassess your need for medications and monitor your lab values.    On the day of Discharge:  VITAL SIGNS:  Blood pressure 98/69, pulse (!) 120, temperature 97.7 F (36.5 C), temperature source Oral, resp. rate 18, height 5\' 9"  (1.753 m), weight 86.1 kg (189 lb 14.4 oz), SpO2 96 %. PHYSICAL EXAMINATION:  GENERAL:  76 y.o.-year-old patient lying in the bed with no acute distress.  EYES: Pupils equal, round, reactive to light and accommodation. No scleral icterus. Extraocular muscles intact.  HEENT: Head atraumatic, normocephalic. Oropharynx and nasopharynx clear.  NECK:  Supple, no jugular venous distention. No thyroid enlargement, no tenderness.  LUNGS: Normal breath sounds bilaterally, no wheezing, rales,rhonchi or crepitation. No use  of accessory muscles of respiration.  CARDIOVASCULAR: S1, S2 normal. No murmurs, rubs, or gallops.  ABDOMEN: Soft, non-tender, non-distended. Bowel sounds present. No organomegaly or mass.  EXTREMITIES: No pedal edema, cyanosis, or clubbing.  NEUROLOGIC: Cranial nerves II through XII are intact. Muscle strength 5/5 in all extremities. Sensation intact. Gait not checked.  PSYCHIATRIC: The patient is alert and oriented x 3.  SKIN: No obvious rash, lesion, or ulcer.  DATA REVIEW:   CBC Recent Labs  Lab 11/08/17 0414  WBC 5.1  HGB 13.0  HCT 40.4  PLT 162    Chemistries  Recent Labs  Lab 11/08/17 0414  NA 135  K 4.2  CL 106  CO2 24  GLUCOSE 172*  BUN 30*  CREATININE 1.46*  CALCIUM 9.4     Follow-up Information    [facility] Schedule an  appointment as soon as possible for a visit in 1 week(s).   Why:  Please call and make appointment. Contact information: [address] [place] [address] [phone]        Alwyn Peaallwood, Dwayne D, MD. Go on 11/16/2017.   Specialties:  Cardiology, Internal Medicine Why:  @ 10:15 Contact information: [address] [facility] [place] [address] [phone]          Management plans discussed with the patient, family and they are in agreement.  CODE STATUS: Prior   TOTAL TIME TAKING CARE OF THIS PATIENT: 45 minutes.    Delfino LovettVipul Adeja Sarratt M.D on 11/12/2017 at 3:36 PM  Between 7am to 6pm - Pager - [phone]  After 6pm go to [email or web address] - Scientist, research (life sciences)password EPAS [facility]  [facility] [place] [facility]  Office  [phone]  CC: Primary care physician; [facility]   Note: This dictation was prepared with Dragon dictation along with smaller phrase technology. Any transcriptional errors that result from this process are unintentional.

## 2017-12-06 ENCOUNTER — Encounter: Payer: Self-pay | Admitting: Emergency Medicine

## 2017-12-06 ENCOUNTER — Emergency Department: Payer: Medicare Other

## 2017-12-06 ENCOUNTER — Inpatient Hospital Stay
Admission: EM | Admit: 2017-12-06 | Discharge: 2017-12-13 | DRG: 291 | Disposition: A | Discharge status: Skilled Nursing Facility | Payer: Medicare Other | Attending: Internal Medicine | Admitting: Internal Medicine

## 2017-12-06 DIAGNOSIS — E7800 Pure hypercholesterolemia, unspecified: Secondary | ICD-10-CM | POA: Diagnosis present

## 2017-12-06 DIAGNOSIS — I5043 Acute on chronic combined systolic (congestive) and diastolic (congestive) heart failure: Secondary | ICD-10-CM | POA: Diagnosis present

## 2017-12-06 DIAGNOSIS — R0902 Hypoxemia: Secondary | ICD-10-CM | POA: Diagnosis present

## 2017-12-06 DIAGNOSIS — R0602 Shortness of breath: Secondary | ICD-10-CM | POA: Diagnosis not present

## 2017-12-06 DIAGNOSIS — E1122 Type 2 diabetes mellitus with diabetic chronic kidney disease: Secondary | ICD-10-CM | POA: Diagnosis present

## 2017-12-06 DIAGNOSIS — Z86711 Personal history of pulmonary embolism: Secondary | ICD-10-CM

## 2017-12-06 DIAGNOSIS — I130 Hypertensive heart and chronic kidney disease with heart failure and stage 1 through stage 4 chronic kidney disease, or unspecified chronic kidney disease: Secondary | ICD-10-CM | POA: Diagnosis not present

## 2017-12-06 DIAGNOSIS — Z7982 Long term (current) use of aspirin: Secondary | ICD-10-CM

## 2017-12-06 DIAGNOSIS — I272 Pulmonary hypertension, unspecified: Secondary | ICD-10-CM | POA: Diagnosis present

## 2017-12-06 DIAGNOSIS — J189 Pneumonia, unspecified organism: Secondary | ICD-10-CM | POA: Diagnosis present

## 2017-12-06 DIAGNOSIS — Z794 Long term (current) use of insulin: Secondary | ICD-10-CM

## 2017-12-06 DIAGNOSIS — I5033 Acute on chronic diastolic (congestive) heart failure: Secondary | ICD-10-CM | POA: Diagnosis present

## 2017-12-06 DIAGNOSIS — I482 Chronic atrial fibrillation, unspecified: Secondary | ICD-10-CM | POA: Diagnosis present

## 2017-12-06 DIAGNOSIS — N183 Chronic kidney disease, stage 3 (moderate): Secondary | ICD-10-CM | POA: Diagnosis present

## 2017-12-06 DIAGNOSIS — T380X5A Adverse effect of glucocorticoids and synthetic analogues, initial encounter: Secondary | ICD-10-CM | POA: Diagnosis present

## 2017-12-06 DIAGNOSIS — I2729 Other secondary pulmonary hypertension: Secondary | ICD-10-CM | POA: Diagnosis present

## 2017-12-06 DIAGNOSIS — Z91041 Radiographic dye allergy status: Secondary | ICD-10-CM

## 2017-12-06 DIAGNOSIS — J440 Chronic obstructive pulmonary disease with acute lower respiratory infection: Secondary | ICD-10-CM | POA: Diagnosis present

## 2017-12-06 DIAGNOSIS — E1165 Type 2 diabetes mellitus with hyperglycemia: Secondary | ICD-10-CM | POA: Diagnosis present

## 2017-12-06 DIAGNOSIS — I10 Essential (primary) hypertension: Secondary | ICD-10-CM | POA: Diagnosis present

## 2017-12-06 DIAGNOSIS — I509 Heart failure, unspecified: Secondary | ICD-10-CM

## 2017-12-06 DIAGNOSIS — K219 Gastro-esophageal reflux disease without esophagitis: Secondary | ICD-10-CM | POA: Diagnosis present

## 2017-12-06 DIAGNOSIS — G9341 Metabolic encephalopathy: Secondary | ICD-10-CM | POA: Diagnosis present

## 2017-12-06 DIAGNOSIS — Z86718 Personal history of other venous thrombosis and embolism: Secondary | ICD-10-CM

## 2017-12-06 DIAGNOSIS — Z8249 Family history of ischemic heart disease and other diseases of the circulatory system: Secondary | ICD-10-CM

## 2017-12-06 DIAGNOSIS — Z8711 Personal history of peptic ulcer disease: Secondary | ICD-10-CM

## 2017-12-06 DIAGNOSIS — J441 Chronic obstructive pulmonary disease with (acute) exacerbation: Secondary | ICD-10-CM | POA: Diagnosis present

## 2017-12-06 DIAGNOSIS — J449 Chronic obstructive pulmonary disease, unspecified: Secondary | ICD-10-CM | POA: Diagnosis present

## 2017-12-06 DIAGNOSIS — E119 Type 2 diabetes mellitus without complications: Secondary | ICD-10-CM

## 2017-12-06 LAB — CBC
HCT: 43.5 % (ref 40.0–52.0)
Hemoglobin: 14.2 g/dL (ref 13.0–18.0)
MCH: 30.1 pg (ref 26.0–34.0)
MCHC: 32.7 g/dL (ref 32.0–36.0)
MCV: 92.1 fL (ref 80.0–100.0)
PLATELETS: 191 10*3/uL (ref 150–440)
RBC: 4.72 MIL/uL (ref 4.40–5.90)
RDW: 16.3 % — ABNORMAL HIGH (ref 11.5–14.5)
WBC: 7.5 10*3/uL (ref 3.8–10.6)

## 2017-12-06 LAB — BASIC METABOLIC PANEL
Anion gap: 10 (ref 5–15)
BUN: 26 mg/dL — ABNORMAL HIGH (ref 6–20)
CALCIUM: 9.9 mg/dL (ref 8.9–10.3)
CHLORIDE: 103 mmol/L (ref 101–111)
CO2: 23 mmol/L (ref 22–32)
CREATININE: 1.51 mg/dL — AB (ref 0.61–1.24)
GFR, EST AFRICAN AMERICAN: 50 mL/min — AB (ref >60)
GFR, EST NON AFRICAN AMERICAN: 43 mL/min — AB (ref >60)
Glucose, Bld: 238 mg/dL — ABNORMAL HIGH (ref 65–99)
Potassium: 4 mmol/L (ref 3.5–5.1)
SODIUM: 136 mmol/L (ref 135–145)

## 2017-12-06 LAB — BRAIN NATRIURETIC PEPTIDE: B NATRIURETIC PEPTIDE 5: 902 pg/mL — AB (ref 0.0–100.0)

## 2017-12-06 LAB — TROPONIN I: TROPONIN I: 0.2 ng/mL — AB (ref <0.03)

## 2017-12-06 MED ORDER — FUROSEMIDE 10 MG/ML IJ SOLN
60.0000 mg | Freq: Once | INTRAMUSCULAR | Status: AC
  Administered 2017-12-06: 60 mg via INTRAVENOUS
  Filled 2017-12-06: qty 8

## 2017-12-06 NOTE — ED Provider Notes (Signed)
[facility] Emergency Department Provider Note  Time seen: 10:02 PM  I have reviewed the triage vital signs and the nursing notes.   HISTORY  Chief Complaint Shortness of Breath    HPI Austin [name] is a 76 y.o. male with a past medical history of asthma, atrial fibrillation, prior PE on aspirin therapy, CHF, COPD on 2 L of oxygen 24/7 who presents to the emergency department for shortness of breath.  According to the patient for the past 1 week he has had a cough, congestion and significant shortness of breath.  Patient states he becomes short of breath walking just 5 feet he will have to stop and rest every minute or so walking which is not normal.  Patient denies any chest pain.  Denies any fever but does state occasional chills.  Does state increased lower extremity edema especially in the right lower extremity with discoloration and increased tenderness.   Past Medical History:  Diagnosis Date  . Acute lower UTI 07/16/2017  . Asthma   . Atrial fibrillation (HCC)   . CHF (congestive heart failure) (HCC)   . Chronic atrial fibrillation (HCC)   . 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 COPD (chronic obstructive pulmonary disease) (HCC)   . Diabetes mellitus without complication (HCC)   . DVT (deep venous thrombosis) (HCC)   . Gastric ulcer   . GERD (gastroesophageal reflux disease)   . Hypercholesteremia   . Hypertension   . Moderate to severe pulmonary hypertension (HCC)--per 2-D echo 07/14/2017 07/15/2017  . PE (pulmonary embolism)   . Sepsis due to undetermined organism with acute respiratory failure (HCC) 07/13/2017    Patient Active Problem List   Diagnosis Date Noted  . Atrial fibrillation (HCC) 11/08/2017  . Atrial fibrillation and flutter (HCC) 11/06/2017  . 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 Acute diastolic CHF (congestive heart failure) (HCC)   . Chronic atrial fibrillation  (HCC)   . Acute lower UTI 07/16/2017  . Sepsis (HCC)   . Acute respiratory failure with hypoxia (HCC) 07/15/2017  . Moderate to severe pulmonary hypertension (HCC)--per 2-D echo 07/14/2017 07/15/2017  . Mass of lung 07/15/2017  . Ascending aortic aneurysm (HCC) 07/14/2017  . Elevated bilirubin 07/14/2017  . Left nephrolithiasis 07/14/2017  . Lactic acidosis 07/14/2017  . Sepsis due to undetermined organism with acute respiratory failure (HCC) 07/13/2017  . COPD exacerbation (HCC) 12/26/2016  . Hypoxia 11/03/2015  . COPD (chronic obstructive pulmonary disease) (HCC) 11/03/2015  . DVT of popliteal vein (HCC) 11/03/2015  . Chronic systolic CHF (congestive heart failure) (HCC) 11/03/2015  . Essential hypertension 11/03/2015  . Diabetes mellitus (HCC) 11/03/2015  . Continuous tobacco abuse 11/03/2015  . Chronic pulmonary embolism (HCC) 11/03/2015  . 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 Hemoptysis 10/31/2015  . Elevated troponin 09/16/2015  . SOB (shortness of breath) 08/24/2015  . Pulmonary embolism (HCC) 08/24/2015  . 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 Type 2 diabetes mellitus (HCC) 08/16/2015  . HLD (hyperlipidemia) 08/16/2015  . GERD (gastroesophageal reflux disease) 08/16/2015  . Chest pain 08/16/2015  . Constipation 08/16/2015  . HTN (hypertension) 07/21/2015  . COPD (chronic obstructive pulmonary disease) with chronic bronchitis (HCC) 07/21/2015  . CHF (NYHA class IV, ACC/AHA stage D) (HCC) 07/04/2015  . CHF (congestive heart failure) (HCC) 07/04/2015    Past Surgical History:  Procedure Laterality Date  . HERNIA REPAIR    .  PERIPHERAL VASCULAR CATHETERIZATION N/A 11/03/2015   Procedure: IVC Filter Insertion;  Surgeon: Annice NeedyJason S Dew, MD;  Location: [facility] INVASIVE CV LAB;  Service: Cardiovascular;  Laterality: N/A;  . PERIPHERAL VASCULAR CATHETERIZATION N/A 07/24/2016   Procedure: IVC Filter Removal;   Surgeon: Annice NeedyJason S Dew, MD;  Location: [facility] INVASIVE CV LAB;  Service: Cardiovascular;  Laterality: N/A;  . PROSTATE SURGERY      Prior to Admission medications   Medication Sig Start Date End Date Taking? Authorizing Provider  albuterol (PROAIR HFA) 108 (90 Base) MCG/ACT inhaler Inhale 1-2 puffs into the lungs every 6 (six) hours as needed for wheezing or shortness of breath.   Yes [provider]  aspirin EC 81 MG tablet Take 81 mg by mouth daily.   Yes [provider]  budesonide-formoterol (SYMBICORT) 160-4.5 MCG/ACT inhaler Inhale 1 puff into the lungs 2 (two) times daily. 07/21/17  Yes Elliot CousinFisher, Denise, MD  digoxin (LANOXIN) 0.125 MG tablet Take 1 tablet (0.125 mg total) by mouth daily. 11/09/17  Yes Delfino LovettShah, Vipul, MD  diltiazem (DILACOR XR) 240 MG 24 hr capsule Take 240 mg by mouth daily.   Yes [provider]  furosemide (LASIX) 40 MG tablet Take 1 tablet (40 mg total) by mouth 2 (two) times daily. Patient taking differently: Take 40 mg by mouth 3 (three) times daily.  07/21/15  Yes Hackney, Tina A, FNP  glipiZIDE (GLUCOTROL) 5 MG tablet Take 1 tablet (5 mg total) by mouth 2 (two) times daily. DO NOT TAKE THIS DIABETES MEDICINE IF YOUR BLOOD SUGAR IS LESS THAN 130. 07/21/17  Yes Elliot CousinFisher, Denise, MD  levalbuterol (XOPENEX) 0.63 MG/3ML nebulizer solution Take 3 mLs (0.63 mg total) by nebulization every 4 (four) hours as needed for wheezing or shortness of breath. 07/21/17 07/21/18 Yes Elliot CousinFisher, Denise, MD  lisinopril (PRINIVIL,ZESTRIL) 2.5 MG tablet Take 2.5 mg by mouth daily.   Yes [provider]  lovastatin (MEVACOR) 40 MG tablet Take 40 mg by mouth at bedtime. Pt should take with food.    Yes [provider]  metFORMIN (GLUCOPHAGE) 500 MG tablet Take 500 mg by mouth 2 (two) times daily with a meal.   Yes [provider]  nitroGLYCERIN (NITROSTAT) 0.4 MG SL tablet Place 0.4 mg under the tongue every 5 (five) minutes x 3 doses as needed for chest  pain. If no relief call 911 or go to emergency room.   Yes [provider]  omeprazole (PRILOSEC) 40 MG capsule Take 40 mg by mouth daily.   Yes [provider]  polyethylene glycol (MIRALAX) packet Take 17 g by mouth daily as needed for moderate constipation. 08/17/15  Yes Delfino LovettShah, Vipul, MD  diltiazem (CARDIZEM CD) 180 MG 24 hr capsule Take 1 capsule (180 mg total) by mouth daily. Patient not taking: Reported on 12/06/2017 11/10/17   Delfino LovettShah, Vipul, MD  sildenafil (REVATIO) 20 MG tablet Take 1 tablet (20 mg total) by mouth 3 (three) times daily. Patient not taking: Reported on 12/06/2017 07/21/17   Elliot CousinFisher, Denise, MD    Allergies  Allergen Reactions  . Contrast Media [Iodinated Diagnostic Agents] Nausea And Vomiting  . Other Other (See Comments)    Cannot have "high doses of anesthesia because of his lungs"    Family History  Problem Relation Age of Onset  . CAD Brother   . Congestive Heart Failure Brother     Social History Social History   Tobacco Use  . Smoking status: Current Every Day Smoker    Packs/day:  1.00    Years: 45.00    Pack years: 45.00    Types: Cigarettes  . Smokeless tobacco: Never Used  Substance Use Topics  . Alcohol use: No  . Drug use: No    Review of Systems Constitutional: Negative for fever.  Positive for intermittent chills. Eyes: Negative for visual changes. ENT: Negative for congestion Cardiovascular: Negative for chest pain. Respiratory: Positive for shortness of breath worse with exertion. Gastrointestinal: Negative for abdominal pain, vomiting Musculoskeletal: Positive for lower extremity edema, right greater than left with right leg tenderness. Skin: Redness to the right leg. Neurological: Negative for headache All other ROS negative  ____________________________________________   PHYSICAL EXAM:  VITAL SIGNS: ED Triage Vitals  Enc Vitals Group     BP 12/06/17 2115 124/82     Pulse Rate 12/06/17 2115 86     Resp  12/06/17 2115 (!) 28     Temp 12/06/17 2115 98.3 F (36.8 C)     Temp Source 12/06/17 2115 Oral     SpO2 12/06/17 2115 93 %     Weight 12/06/17 2116 190 lb (86.2 kg)     Height 12/06/17 2116 6\' 1"  (1.854 m)     Head Circumference --      Peak Flow --      Pain Score 12/06/17 2115 4     Pain Loc --      Pain Edu? --      Excl. in GC? --    Constitutional: Alert and oriented. Well appearing and in no distress. Eyes: Normal exam ENT   Head: Normocephalic and atraumatic   Mouth/Throat: Mucous membranes are moist. Cardiovascular: Irregular rhythm, rate around 70 bpm Respiratory:  patient has mild tachypnea, rales in the lower lung fields bilaterally, no obvious wheeze or rhonchi. Gastrointestinal: Soft, nontender, no distention. Musculoskeletal: Positive lower extremity edema bilaterally, right greater than left, erythema and tenderness of the right lower extremity. Neurologic:  Normal speech and language. No gross focal neurologic deficits  Skin:  Skin is warm, dry and intact.  Erythematous in the right lower extremity without weeping. Psychiatric: Mood and affect are normal.   ____________________________________________    EKG  EKG reviewed and interpreted by myself shows atrial fibrillation at 83 bpm with a widened QRS, normal axis, largely normal intervals with nonspecific ST changes.  Morphology is largely unchanged from prior EKG 11/06/17.  ____________________________________________    RADIOLOGY  Chest x-ray shows bilateral pleural effusions with interstitial pulmonary edema.  ____________________________________________   INITIAL IMPRESSION / ASSESSMENT AND PLAN / ED COURSE  Pertinent labs & imaging results that were available during my care of the patient were reviewed by me and considered in my medical decision making (see chart for details).  Patient presents the emergency department for worsening shortness of breath and lower extremity edema over the  past 1 week.  Patient does state cough and congestion with occasional chills.  Afebrile, normal heart rate.  The patient is satting 93% currently on 4 L of oxygen.  Differential would include CHF exacerbation, pulmonary edema, COPD exacerbation, pulmonary embolism.  Examination is consistent with rales likely CHF exacerbation given lower extremity edema and rales on exam.  Will obtain a chest x-ray labs and continue to closely monitor.  Patient agreeable to this plan of care.  No distress currently sitting in bed.  ____________________________________________   FINAL CLINICAL IMPRESSION(S) / ED DIAGNOSES  Dyspnea CHF exacerbation Pulmonary edema   Minna AntisPaduchowski, Rosaleigh Brazzel, MD 12/07/17 2339

## 2017-12-06 NOTE — ED Notes (Signed)
Patient transported to X-ray 

## 2017-12-06 NOTE — ED Triage Notes (Signed)
Pt comes into the ED via POV c/o increased shortness of breath and swelling to bilateral lower legs.  Patient wears 2L [place] chronically for his COPD and he also has CHF.  Patient states he has been short of breath all week and could no longer take it.  Patient has tachypnea at rest in triage at this time.  Patient alert and oriented.

## 2017-12-06 NOTE — ED Notes (Signed)
Patient transported to Ultrasound 

## 2017-12-07 ENCOUNTER — Inpatient Hospital Stay: Payer: Medicare Other

## 2017-12-07 ENCOUNTER — Other Ambulatory Visit: Payer: Self-pay

## 2017-12-07 DIAGNOSIS — Z8711 Personal history of peptic ulcer disease: Secondary | ICD-10-CM | POA: Diagnosis not present

## 2017-12-07 DIAGNOSIS — I272 Pulmonary hypertension, unspecified: Secondary | ICD-10-CM | POA: Diagnosis present

## 2017-12-07 DIAGNOSIS — R0902 Hypoxemia: Secondary | ICD-10-CM | POA: Diagnosis present

## 2017-12-07 DIAGNOSIS — J440 Chronic obstructive pulmonary disease with acute lower respiratory infection: Secondary | ICD-10-CM | POA: Diagnosis present

## 2017-12-07 DIAGNOSIS — R0602 Shortness of breath: Secondary | ICD-10-CM | POA: Diagnosis present

## 2017-12-07 DIAGNOSIS — T380X5A Adverse effect of glucocorticoids and synthetic analogues, initial encounter: Secondary | ICD-10-CM | POA: Diagnosis present

## 2017-12-07 DIAGNOSIS — J441 Chronic obstructive pulmonary disease with (acute) exacerbation: Secondary | ICD-10-CM | POA: Diagnosis present

## 2017-12-07 DIAGNOSIS — Z86711 Personal history of pulmonary embolism: Secondary | ICD-10-CM | POA: Diagnosis not present

## 2017-12-07 DIAGNOSIS — E7800 Pure hypercholesterolemia, unspecified: Secondary | ICD-10-CM | POA: Diagnosis present

## 2017-12-07 DIAGNOSIS — N183 Chronic kidney disease, stage 3 (moderate): Secondary | ICD-10-CM | POA: Diagnosis present

## 2017-12-07 DIAGNOSIS — Z8249 Family history of ischemic heart disease and other diseases of the circulatory system: Secondary | ICD-10-CM | POA: Diagnosis not present

## 2017-12-07 DIAGNOSIS — E1165 Type 2 diabetes mellitus with hyperglycemia: Secondary | ICD-10-CM | POA: Diagnosis present

## 2017-12-07 DIAGNOSIS — J449 Chronic obstructive pulmonary disease, unspecified: Secondary | ICD-10-CM | POA: Diagnosis not present

## 2017-12-07 DIAGNOSIS — J189 Pneumonia, unspecified organism: Secondary | ICD-10-CM | POA: Diagnosis present

## 2017-12-07 DIAGNOSIS — Z7982 Long term (current) use of aspirin: Secondary | ICD-10-CM | POA: Diagnosis not present

## 2017-12-07 DIAGNOSIS — E1122 Type 2 diabetes mellitus with diabetic chronic kidney disease: Secondary | ICD-10-CM | POA: Diagnosis present

## 2017-12-07 DIAGNOSIS — I2729 Other secondary pulmonary hypertension: Secondary | ICD-10-CM | POA: Diagnosis present

## 2017-12-07 DIAGNOSIS — I5043 Acute on chronic combined systolic (congestive) and diastolic (congestive) heart failure: Secondary | ICD-10-CM | POA: Diagnosis present

## 2017-12-07 DIAGNOSIS — K219 Gastro-esophageal reflux disease without esophagitis: Secondary | ICD-10-CM | POA: Diagnosis present

## 2017-12-07 DIAGNOSIS — G9341 Metabolic encephalopathy: Secondary | ICD-10-CM | POA: Diagnosis present

## 2017-12-07 DIAGNOSIS — I482 Chronic atrial fibrillation: Secondary | ICD-10-CM | POA: Diagnosis present

## 2017-12-07 DIAGNOSIS — Z91041 Radiographic dye allergy status: Secondary | ICD-10-CM | POA: Diagnosis not present

## 2017-12-07 DIAGNOSIS — I5033 Acute on chronic diastolic (congestive) heart failure: Secondary | ICD-10-CM | POA: Diagnosis not present

## 2017-12-07 DIAGNOSIS — I130 Hypertensive heart and chronic kidney disease with heart failure and stage 1 through stage 4 chronic kidney disease, or unspecified chronic kidney disease: Secondary | ICD-10-CM | POA: Diagnosis present

## 2017-12-07 DIAGNOSIS — Z794 Long term (current) use of insulin: Secondary | ICD-10-CM | POA: Diagnosis not present

## 2017-12-07 DIAGNOSIS — Z86718 Personal history of other venous thrombosis and embolism: Secondary | ICD-10-CM | POA: Diagnosis not present

## 2017-12-07 LAB — TROPONIN I
Troponin I: 0.2 ng/mL (ref <0.03)
Troponin I: 0.2 ng/mL (ref <0.03)
Troponin I: 0.19 ng/mL (ref <0.03)

## 2017-12-07 LAB — CBC
HEMATOCRIT: 41.1 % (ref 40.0–52.0)
HEMOGLOBIN: 13.2 g/dL (ref 13.0–18.0)
MCH: 29.7 pg (ref 26.0–34.0)
MCHC: 32.2 g/dL (ref 32.0–36.0)
MCV: 92.2 fL (ref 80.0–100.0)
Platelets: 183 10*3/uL (ref 150–440)
RBC: 4.46 MIL/uL (ref 4.40–5.90)
RDW: 16 % — ABNORMAL HIGH (ref 11.5–14.5)
WBC: 5.7 10*3/uL (ref 3.8–10.6)

## 2017-12-07 LAB — GLUCOSE, CAPILLARY
GLUCOSE-CAPILLARY: 77 mg/dL (ref 65–99)
GLUCOSE-CAPILLARY: 153 mg/dL — AB (ref 65–99)
GLUCOSE-CAPILLARY: 86 mg/dL (ref 65–99)
GLUCOSE-CAPILLARY: 110 mg/dL — AB (ref 65–99)
Glucose-Capillary: 102 mg/dL — ABNORMAL HIGH (ref 65–99)

## 2017-12-07 LAB — BASIC METABOLIC PANEL
ANION GAP: 6 (ref 5–15)
BUN: 22 mg/dL — ABNORMAL HIGH (ref 6–20)
CO2: 28 mmol/L (ref 22–32)
Calcium: 9.7 mg/dL (ref 8.9–10.3)
Chloride: 106 mmol/L (ref 101–111)
Creatinine, Ser: 1.36 mg/dL — ABNORMAL HIGH (ref 0.61–1.24)
GFR calc Af Amer: 57 mL/min — ABNORMAL LOW (ref >60)
GFR calc non Af Amer: 49 mL/min — ABNORMAL LOW (ref >60)
GLUCOSE: 85 mg/dL (ref 65–99)
POTASSIUM: 3.9 mmol/L (ref 3.5–5.1)
Sodium: 140 mmol/L (ref 135–145)

## 2017-12-07 MED ORDER — HEPARIN SODIUM (PORCINE) 5000 UNIT/ML IJ SOLN
5000.0000 [IU] | Freq: Three times a day (TID) | INTRAMUSCULAR | Status: DC
  Administered 2017-12-07 – 2017-12-13 (×20): 5000 [IU] via SUBCUTANEOUS
  Filled 2017-12-07 (×20): qty 1

## 2017-12-07 MED ORDER — PRAVASTATIN SODIUM 40 MG PO TABS
40.0000 mg | ORAL_TABLET | Freq: Every day | ORAL | Status: DC
  Administered 2017-12-07 – 2017-12-12 (×6): 40 mg via ORAL
  Filled 2017-12-07 (×6): qty 1

## 2017-12-07 MED ORDER — ALBUTEROL SULFATE (2.5 MG/3ML) 0.083% IN NEBU
3.0000 mL | INHALATION_SOLUTION | Freq: Four times a day (QID) | RESPIRATORY_TRACT | Status: DC | PRN
  Administered 2017-12-08 – 2017-12-12 (×3): 3 mL via RESPIRATORY_TRACT
  Filled 2017-12-07 (×2): qty 3

## 2017-12-07 MED ORDER — INSULIN ASPART 100 UNIT/ML ~~LOC~~ SOLN
0.0000 [IU] | Freq: Every day | SUBCUTANEOUS | Status: DC
  Administered 2017-12-08 – 2017-12-09 (×2): 3 [IU] via SUBCUTANEOUS
  Administered 2017-12-11 – 2017-12-12 (×2): 2 [IU] via SUBCUTANEOUS
  Filled 2017-12-07 (×4): qty 1

## 2017-12-07 MED ORDER — NITROGLYCERIN 0.4 MG SL SUBL
0.4000 mg | SUBLINGUAL_TABLET | SUBLINGUAL | Status: DC | PRN
  Administered 2017-12-10: 0.4 mg via SUBLINGUAL
  Filled 2017-12-07: qty 1

## 2017-12-07 MED ORDER — MOMETASONE FURO-FORMOTEROL FUM 200-5 MCG/ACT IN AERO
2.0000 | INHALATION_SPRAY | Freq: Two times a day (BID) | RESPIRATORY_TRACT | Status: DC
  Administered 2017-12-07 – 2017-12-13 (×13): 2 via RESPIRATORY_TRACT
  Filled 2017-12-07: qty 8.8

## 2017-12-07 MED ORDER — ONDANSETRON HCL 4 MG/2ML IJ SOLN: 4.0000 mg | Freq: Four times a day (QID) | INTRAMUSCULAR | Status: DC | PRN

## 2017-12-07 MED ORDER — INSULIN ASPART 100 UNIT/ML ~~LOC~~ SOLN
0.0000 [IU] | Freq: Three times a day (TID) | SUBCUTANEOUS | Status: DC
  Administered 2017-12-07: 2 [IU] via SUBCUTANEOUS
  Administered 2017-12-08: 3 [IU] via SUBCUTANEOUS
  Administered 2017-12-09: 9 [IU] via SUBCUTANEOUS
  Administered 2017-12-09: 3 [IU] via SUBCUTANEOUS
  Administered 2017-12-09 – 2017-12-10 (×2): 5 [IU] via SUBCUTANEOUS
  Administered 2017-12-10: 9 [IU] via SUBCUTANEOUS
  Filled 2017-12-07 (×7): qty 1

## 2017-12-07 MED ORDER — ACETAMINOPHEN 325 MG PO TABS
650.0000 mg | ORAL_TABLET | Freq: Four times a day (QID) | ORAL | Status: DC | PRN
  Administered 2017-12-07: 650 mg via ORAL
  Filled 2017-12-07: qty 2

## 2017-12-07 MED ORDER — DIPHENHYDRAMINE HCL 50 MG/ML IJ SOLN
25.0000 mg | Freq: Once | INTRAMUSCULAR | Status: AC
  Administered 2017-12-07: 25 mg via INTRAVENOUS

## 2017-12-07 MED ORDER — LISINOPRIL 5 MG PO TABS
2.5000 mg | ORAL_TABLET | Freq: Every day | ORAL | Status: DC
  Administered 2017-12-07 – 2017-12-08 (×2): 2.5 mg via ORAL
  Filled 2017-12-07 (×2): qty 1

## 2017-12-07 MED ORDER — ONDANSETRON HCL 4 MG/2ML IJ SOLN
INTRAMUSCULAR | Status: AC
  Filled 2017-12-07: qty 2

## 2017-12-07 MED ORDER — PANTOPRAZOLE SODIUM 40 MG PO TBEC
40.0000 mg | DELAYED_RELEASE_TABLET | Freq: Every day | ORAL | Status: DC
  Administered 2017-12-07 – 2017-12-13 (×7): 40 mg via ORAL
  Filled 2017-12-07 (×7): qty 1

## 2017-12-07 MED ORDER — ONDANSETRON HCL 4 MG PO TABS: 4.0000 mg | ORAL_TABLET | Freq: Four times a day (QID) | ORAL | Status: DC | PRN

## 2017-12-07 MED ORDER — ASPIRIN EC 81 MG PO TBEC
81.0000 mg | DELAYED_RELEASE_TABLET | Freq: Every day | ORAL | Status: DC
  Administered 2017-12-07 – 2017-12-13 (×7): 81 mg via ORAL
  Filled 2017-12-07 (×7): qty 1

## 2017-12-07 MED ORDER — DIGOXIN 125 MCG PO TABS
0.1250 mg | ORAL_TABLET | Freq: Every day | ORAL | Status: DC
  Administered 2017-12-07 – 2017-12-13 (×7): 0.125 mg via ORAL
  Filled 2017-12-07 (×7): qty 1

## 2017-12-07 MED ORDER — LEVALBUTEROL HCL 0.63 MG/3ML IN NEBU
0.6300 mg | INHALATION_SOLUTION | RESPIRATORY_TRACT | Status: DC | PRN
  Administered 2017-12-11: 0.63 mg via RESPIRATORY_TRACT
  Filled 2017-12-07: qty 3

## 2017-12-07 MED ORDER — ACETAMINOPHEN 650 MG RE SUPP: 650.0000 mg | Freq: Four times a day (QID) | RECTAL | Status: DC | PRN

## 2017-12-07 MED ORDER — DILTIAZEM HCL ER COATED BEADS 120 MG PO CP24
240.0000 mg | ORAL_CAPSULE | Freq: Every day | ORAL | Status: DC
  Administered 2017-12-07 – 2017-12-13 (×6): 240 mg via ORAL
  Filled 2017-12-07 (×7): qty 2

## 2017-12-07 MED ORDER — IOPAMIDOL (ISOVUE-370) INJECTION 76%
75.0000 mL | Freq: Once | INTRAVENOUS | Status: AC | PRN
  Administered 2017-12-07: 75 mL via INTRAVENOUS

## 2017-12-07 MED ORDER — ONDANSETRON HCL 4 MG/2ML IJ SOLN
4.0000 mg | Freq: Once | INTRAMUSCULAR | Status: AC
Start: 2017-12-07 — End: 2017-12-07
  Administered 2017-12-07: 4 mg via INTRAVENOUS

## 2017-12-07 MED ORDER — DIPHENHYDRAMINE HCL 50 MG/ML IJ SOLN
INTRAMUSCULAR | Status: AC
  Administered 2017-12-07: 25 mg via INTRAVENOUS
  Filled 2017-12-07: qty 1

## 2017-12-07 MED ORDER — FUROSEMIDE 10 MG/ML IJ SOLN
20.0000 mg | Freq: Three times a day (TID) | INTRAMUSCULAR | Status: DC
  Administered 2017-12-07 – 2017-12-08 (×3): 20 mg via INTRAVENOUS
  Filled 2017-12-07 (×4): qty 2

## 2017-12-07 NOTE — ED Notes (Signed)
Patient transported to CT 

## 2017-12-07 NOTE — H&P (Signed)
[facility] - [place] at [facility]   PATIENT NAME: Austin [name]    MR#:  [id number]  DATE OF BIRTH:  12-10-1941  DATE OF ADMISSION:  12/06/2017  PRIMARY CARE PHYSICIAN: [facility]   REQUESTING/REFERRING PHYSICIAN: Lenard Lance, MD  CHIEF COMPLAINT:   Chief Complaint  Patient presents with  . Shortness of Breath    HISTORY OF PRESENT ILLNESS:  Austin [name]  is a 76 y.o. male who presents with shortness of breath.  Patient states that for the last 3 or 4 days he has been getting extremely dyspneic even just walking across the room.  Patient has a history of DVT and PE in the past.  He also has history of heart failure.  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  Hospitalist were called for admission and further evaluation and treatment.  PAST MEDICAL HISTORY:   Past Medical History:  Diagnosis Date  . Acute lower UTI 07/16/2017  . Asthma   . Atrial fibrillation (HCC)   . CHF (congestive heart failure) (HCC)   . Chronic atrial fibrillation (HCC)   . 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 COPD (chronic obstructive pulmonary disease) (HCC)   . Diabetes mellitus without complication (HCC)   . DVT (deep venous thrombosis) (HCC)   . Gastric ulcer   . GERD (gastroesophageal reflux disease)   . Hypercholesteremia   . Hypertension   . Moderate to severe pulmonary hypertension (HCC)--per 2-D echo 07/14/2017 07/15/2017  . PE (pulmonary embolism)   . Sepsis due to undetermined organism with acute respiratory failure (HCC) 07/13/2017    PAST SURGICAL HISTORY:   Past Surgical History:  Procedure Laterality Date  . HERNIA REPAIR    . PERIPHERAL VASCULAR CATHETERIZATION N/A 11/03/2015   Procedure: IVC Filter Insertion;  Surgeon: Annice Needy, MD;  Location: [facility] INVASIVE CV LAB;  Service: Cardiovascular;  Laterality: N/A;  . PERIPHERAL VASCULAR  CATHETERIZATION N/A 07/24/2016   Procedure: IVC Filter Removal;  Surgeon: Annice Needy, MD;  Location: [facility] INVASIVE CV LAB;  Service: Cardiovascular;  Laterality: N/A;  . PROSTATE SURGERY      SOCIAL HISTORY:   Social History   Tobacco Use  . Smoking status: Current Every Day Smoker    Packs/day: 1.00    Years: 45.00    Pack years: 45.00    Types: Cigarettes  . Smokeless tobacco: Never Used  Substance Use Topics  . Alcohol use: No    FAMILY HISTORY:   Family History  Problem Relation Age of Onset  . CAD Brother   . Congestive Heart Failure Brother     DRUG ALLERGIES:   Allergies  Allergen Reactions  . Contrast Media [Iodinated Diagnostic Agents] Nausea And Vomiting  . Other Other (See Comments)    Cannot have "high doses of anesthesia because of his lungs"    MEDICATIONS AT HOME:   Prior to Admission medications   Medication Sig Start Date End Date Taking? Authorizing Provider  albuterol (PROAIR HFA) 108 (90 Base) MCG/ACT inhaler Inhale 1-2 puffs into the lungs every 6 (six) hours as needed for wheezing or shortness of breath.   Yes [provider]  aspirin EC 81 MG tablet Take 81 mg by mouth daily.   Yes [provider]  budesonide-formoterol (SYMBICORT) 160-4.5 MCG/ACT inhaler Inhale 1 puff into the lungs 2 (two) times daily. 07/21/17  Yes Elliot Cousin, MD  digoxin Margit Banda)  0.125 MG tablet Take 1 tablet (0.125 mg total) by mouth daily. 11/09/17  Yes Delfino LovettShah, Vipul, MD  diltiazem (DILACOR XR) 240 MG 24 hr capsule Take 240 mg by mouth daily.   Yes [provider]  furosemide (LASIX) 40 MG tablet Take 1 tablet (40 mg total) by mouth 2 (two) times daily. Patient taking differently: Take 40 mg by mouth 3 (three) times daily.  07/21/15  Yes Hackney, Tina A, FNP  glipiZIDE (GLUCOTROL) 5 MG tablet Take 1 tablet (5 mg total) by mouth 2 (two) times daily. DO NOT TAKE THIS DIABETES MEDICINE IF YOUR BLOOD SUGAR IS LESS THAN 130. 07/21/17  Yes Elliot CousinFisher,  Denise, MD  levalbuterol (XOPENEX) 0.63 MG/3ML nebulizer solution Take 3 mLs (0.63 mg total) by nebulization every 4 (four) hours as needed for wheezing or shortness of breath. 07/21/17 07/21/18 Yes Elliot CousinFisher, Denise, MD  lisinopril (PRINIVIL,ZESTRIL) 2.5 MG tablet Take 2.5 mg by mouth daily.   Yes [provider]  lovastatin (MEVACOR) 40 MG tablet Take 40 mg by mouth at bedtime. Pt should take with food.    Yes [provider]  nitroGLYCERIN (NITROSTAT) 0.4 MG SL tablet Place 0.4 mg under the tongue every 5 (five) minutes x 3 doses as needed for chest pain. If no relief call 911 or go to emergency room.   Yes [provider]  omeprazole (PRILOSEC) 40 MG capsule Take 40 mg by mouth daily.   Yes [provider]  polyethylene glycol (MIRALAX) packet Take 17 g by mouth daily as needed for moderate constipation. 08/17/15  Yes Delfino LovettShah, Vipul, MD  diltiazem (CARDIZEM CD) 180 MG 24 hr capsule Take 1 capsule (180 mg total) by mouth daily. Patient not taking: Reported on 12/06/2017 11/10/17   Delfino LovettShah, Vipul, MD  sildenafil (REVATIO) 20 MG tablet Take 1 tablet (20 mg total) by mouth 3 (three) times daily. Patient not taking: Reported on 12/06/2017 07/21/17   Elliot CousinFisher, Denise, MD    REVIEW OF SYSTEMS:  Review of Systems  Constitutional: Negative for chills, fever, malaise/fatigue and weight loss.  HENT: Negative for ear pain, hearing loss and tinnitus.   Eyes: Negative for blurred vision, double vision, pain and redness.  Respiratory: Positive for shortness of breath. Negative for cough and hemoptysis.   Cardiovascular: Positive for leg swelling. Negative for chest pain, palpitations and orthopnea.  Gastrointestinal: Negative for abdominal pain, constipation, diarrhea, nausea and vomiting.  Genitourinary: Negative for dysuria, frequency and hematuria.  Musculoskeletal: Negative for back pain, joint pain and neck pain.  Skin:       No acne, rash, or lesions  Neurological: Negative for  dizziness, tremors, focal weakness and weakness.  Endo/Heme/Allergies: Negative for polydipsia. Does not bruise/bleed easily.  Psychiatric/Behavioral: Negative for depression. The patient is not nervous/anxious and does not have insomnia.      VITAL SIGNS:   Vitals:   12/06/17 2153 12/06/17 2200 12/06/17 2221 12/07/17 0000  BP: 125/85 132/90  (!) 143/96  Pulse: 83 79 97 70  Resp: (!) 26 (!) 24 (!) 23 (!) 22  Temp:      TempSrc:      SpO2: 94% 94% 97% 97%  Weight:      Height:       Wt Readings from Last 3 Encounters:  12/06/17 86.2 kg (190 lb)  11/09/17 86.1 kg (189 lb 14.4 oz)  07/21/17 92.1 kg (203 lb)    PHYSICAL EXAMINATION:  Physical Exam  Vitals reviewed. Constitutional: He is oriented to person, place, and time.  He appears well-developed and well-nourished. No distress.  HENT:  Head: Normocephalic and atraumatic.  Mouth/Throat: Oropharynx is clear and moist.  Eyes: Conjunctivae and EOM are normal. Pupils are equal, round, and reactive to light. No scleral icterus.  Neck: Normal range of motion. Neck supple. No JVD present. No thyromegaly present.  Cardiovascular: Normal rate, regular rhythm and intact distal pulses. Exam reveals no gallop and no friction rub.  No murmur heard. Respiratory: Effort normal. No respiratory distress. He has no wheezes. He has rales.  GI: Soft. Bowel sounds are normal. He exhibits no distension. There is no tenderness.  Musculoskeletal: Normal range of motion. He exhibits no edema.  No arthritis, no gout  Lymphadenopathy:    He has no cervical adenopathy.  Neurological: He is alert and oriented to person, place, and time. No cranial nerve deficit.  No dysarthria, no aphasia  Skin: Skin is warm and dry. No rash noted. There is erythema (Distal bilateral lower extremities, right greater than left).  Psychiatric: He has a normal mood and affect. His behavior is normal. Judgment and thought content normal.    LABORATORY PANEL:    CBC Recent Labs  Lab 12/06/17 2145  WBC 7.5  HGB 14.2  HCT 43.5  PLT 191   ------------------------------------------------------------------------------------------------------------------  Chemistries  Recent Labs  Lab 12/06/17 2145  NA 136  K 4.0  CL 103  CO2 23  GLUCOSE 238*  BUN 26*  CREATININE 1.51*  CALCIUM 9.9   ------------------------------------------------------------------------------------------------------------------  Cardiac Enzymes Recent Labs  Lab 12/06/17 2145  TROPONINI 0.20*   ------------------------------------------------------------------------------------------------------------------  RADIOLOGY:  Dg Chest 2 View  Result Date: 12/06/2017 CLINICAL DATA:  76 y/o  M; shortness of breath and chest pain. EXAM: CHEST  2 VIEW COMPARISON:  11/06/2017 chest radiograph FINDINGS: Stable cardiomegaly given projection and technique. Aortic atherosclerosis. Small bilateral pleural effusions. Bibasilar opacities. Interstitial pulmonary edema. No acute osseous abnormality is evident. IMPRESSION: Small bilateral pleural effusions and interstitial pulmonary edema. Bibasilar opacities probably represent atelectasis. Stable cardiomegaly. Electronically Signed   By: Mitzi Hansen M.D.   On: 12/06/2017 22:03   US Venous Img Lower Unilateral Right  Result Date: 12/06/2017 CLINICAL DATA:  Right leg pain and edema for 2 weeks EXAM: Right LOWER EXTREMITY VENOUS DOPPLER ULTRASOUND TECHNIQUE: Gray-scale sonography with graded compression, as well as color Doppler and duplex ultrasound were performed to evaluate the lower extremity deep venous systems from the level of the common femoral vein and including the common femoral, femoral, profunda femoral, popliteal and calf veins including the posterior tibial, peroneal and gastrocnemius veins when visible. The superficial great saphenous vein was also interrogated. Spectral Doppler was utilized to evaluate flow  at rest and with distal augmentation maneuvers in the common femoral, femoral and popliteal veins. COMPARISON:  04/11/2015 FINDINGS: Contralateral Common Femoral Vein: Respiratory phasicity is normal and symmetric with the symptomatic side. No evidence of thrombus. Normal compressibility. Common Femoral Vein: No evidence of thrombus. Normal compressibility, respiratory phasicity and response to augmentation. Saphenofemoral Junction: No evidence of thrombus. Normal compressibility and flow on color Doppler imaging. Profunda Femoral Vein: No evidence of thrombus. Normal compressibility and flow on color Doppler imaging. Femoral Vein: No evidence of thrombus. Normal compressibility, respiratory phasicity and response to augmentation. Popliteal Vein: Echogenic filling defects within the right popliteal vein, similar in appearance to the 2016 venous ultrasound. Calf Veins: Poorly visualized due to edema. Superficial Great Saphenous Vein: No evidence of thrombus. Normal compressibility. Venous Reflux:  None. Other Findings:  None. 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 No definite acute DVT is seen. Poorly visible calf vessels due to edema. Electronically Signed   By: Jasmine PangKim  Fujinaga M.D.   On: 12/06/2017 23:55    EKG:   Orders placed or performed during the hospital encounter of 12/06/17  . ED EKG within 10 minutes  . ED EKG within 10 minutes  . EKG 12-Lead  . EKG 12-Lead    IMPRESSION AND PLAN:  Principal Problem:   Acute on chronic diastolic CHF (congestive heart failure) (HCC) -BNP was significantly elevated in the ED.  Troponin was also 0.2.  Lasix given in the ED.  Cardiology consult ordered.  We will trend his cardiac enzymes.  There is also some concern for possible PE given his history.  We will get a CTA chest Active Problems:   COPD (chronic  obstructive pulmonary disease) (HCC) -continue home dose inhalers   Essential hypertension -continue home meds   Diabetes mellitus (HCC) -sliding scale insulin with corresponding glucose checks.  Patient is on metformin, but looking at his history of chronic kidney disease he should likely discontinue this medication   Chronic atrial fibrillation (HCC) -continue home rate controlling medications   GERD (gastroesophageal reflux disease) -home dose PPI  All the records are reviewed and case discussed with ED provider. Management plans discussed with the patient and/or family.  DVT PROPHYLAXIS: SubQ heparin  GI PROPHYLAXIS: PPI  ADMISSION STATUS: Inpatient  CODE STATUS: Full Code Status History    Date Active Date Inactive Code Status Order ID Comments User Context   11/06/2017 19:46 11/09/2017 14:55 Full Code [id number]  Houston SirenSainani, Vivek J, MD Inpatient   07/14/2017 00:55 07/21/2017 19:12 Full Code [id number]  Bobette Mortiz, Alayzia Pavlock Manuel, MD Inpatient   12/26/2016 23:05 12/27/2016 20:34 Full Code [id number]  Auburn BilberryPatel, Shreyang, MD Inpatient   10/31/2015 09:34 11/03/2015 20:11 Full Code [id number]  Arnaldo Nataliamond, Michael S, MD Inpatient   09/16/2015 01:49 09/16/2015 18:22 Full Code [id number]  Arnaldo Nataliamond, Michael S, MD ED   08/24/2015 17:42 08/27/2015 16:10 Full Code [id number]  Auburn BilberryPatel, Shreyang, MD Inpatient   08/16/2015 03:55 08/17/2015 20:22 Full Code [id number]  Oralia ManisWillis, Melessa Cowell, MD Inpatient   07/04/2015 14:22 07/08/2015 16:04 Full Code [id number]  Altamese DillingVachhani, Vaibhavkumar, MD Inpatient      TOTAL TIME TAKING CARE OF THIS PATIENT: 45 minutes.   Yanai Hobson FIELDING 12/07/2017, 12:20 AM  [facility]  Office  [phone]  CC: Primary care physician; [facility]  Note:  This document was prepared using Dragon voice recognition software and may include unintentional dictation errors.

## 2017-12-07 NOTE — Plan of Care (Addendum)
Pt is A&Ox4. VSS. 4L O2 [place] . OOB with assist. Voids in urinal. BLE dressing placed this shift per order. Afib on monitor. No complaints thus far. Will continue to monitor and report to oncoming RN .  Progressing Education: Knowledge of General Education information will improve 12/07/2017 1515 - Progressing by Jodie EchevariaWhite, Staphany Ditton L, RN Health Behavior/Discharge Planning: Ability to manage health-related needs will improve 12/07/2017 1515 - Progressing by Jodie EchevariaWhite, Tamiyah Moulin L, RN Clinical Measurements: Ability to maintain clinical measurements within normal limits will improve 12/07/2017 1515 - Progressing by Jodie EchevariaWhite, [place] Cinquemani L, RN Will remain free from infection 12/07/2017 1515 - Progressing by Jodie EchevariaWhite, Carlia Bomkamp L, RN Diagnostic test results will improve 12/07/2017 1515 - Progressing by Jodie EchevariaWhite, Boomer Winders L, RN Respiratory complications will improve 12/07/2017 1515 - Progressing by Jodie EchevariaWhite, Loyola Santino L, RN Cardiovascular complication will be avoided 12/07/2017 1515 - Progressing by Jodie EchevariaWhite, Jamilyn Pigeon L, RN Activity: Risk for activity intolerance will decrease 12/07/2017 1515 - Progressing by Jodie EchevariaWhite, Kashlyn Salinas L, RN Nutrition: Adequate nutrition will be maintained 12/07/2017 1515 - Progressing by Jodie EchevariaWhite, Napoleon Monacelli L, RN Coping: Level of anxiety will decrease 12/07/2017 1515 - Progressing by Jodie EchevariaWhite, Grissel Tyrell L, RN Elimination: Will not experience complications related to bowel motility 12/07/2017 1515 - Progressing by Jodie EchevariaWhite, Syona Wroblewski L, RN Will not experience complications related to urinary retention 12/07/2017 1515 - Progressing by Jodie EchevariaWhite, Javyn Havlin L, RN Pain Managment: General experience of comfort will improve 12/07/2017 1515 - Progressing by Jodie EchevariaWhite, Ivi Griffith L, RN Safety: Ability to remain free from injury will improve 12/07/2017 1515 - Progressing by Jodie EchevariaWhite, Weronika Birch L, RN Skin Integrity: Risk for impaired skin integrity will decrease 12/07/2017 1515 - Progressing by Jodie EchevariaWhite, Denaisha Swango L, RN

## 2017-12-07 NOTE — Consult Note (Signed)
Austin [name] is a 76 y.o. male  [id number]  [facility] Reason for Consultation: CHF  HPI: 76 year old African American male with a past medical history of pulmonary embolism congestive heart failure and atrial fibrillation presented to the hospital with shortness of breath orthopnea PND and leg swelling.   Review of Systems: Patient does have some chest pain in the retrosternal area.   Past Medical History:  Diagnosis Date  . Acute lower UTI 07/16/2017  . Asthma   . Atrial fibrillation (HCC)   . CHF (congestive heart failure) (HCC)   . Chronic atrial fibrillation (HCC)   . 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 COPD (chronic obstructive pulmonary disease) (HCC)   . Diabetes mellitus without complication (HCC)   . DVT (deep venous thrombosis) (HCC)   . Gastric ulcer   . GERD (gastroesophageal reflux disease)   . Hypercholesteremia   . Hypertension   . Moderate to severe pulmonary hypertension (HCC)--per 2-D echo 07/14/2017 07/15/2017  . PE (pulmonary embolism)   . Sepsis due to undetermined organism with acute respiratory failure (HCC) 07/13/2017    Medications Prior to Admission  Medication Sig Dispense Refill  . albuterol (PROAIR HFA) 108 (90 Base) MCG/ACT inhaler Inhale 1-2 puffs into the lungs every 6 (six) hours as needed for wheezing or shortness of breath.    Marland Kitchen. aspirin EC 81 MG tablet Take 81 mg by mouth daily.    . budesonide-formoterol (SYMBICORT) 160-4.5 MCG/ACT inhaler Inhale 1 puff into the lungs 2 (two) times daily. 1 Inhaler 12  . digoxin (LANOXIN) 0.125 MG tablet Take 1 tablet (0.125 mg total) by mouth daily. 30 tablet 0  . diltiazem (DILACOR XR) 240 MG 24 hr capsule Take 240 mg by mouth daily.    . furosemide (LASIX) 40 MG tablet Take 1 tablet (40 mg total) by mouth 2 (two) times daily. (Patient taking differently: Take 40 mg by mouth 3 (three) times daily. ) 60 tablet 5  . glipiZIDE (GLUCOTROL) 5 MG  tablet Take 1 tablet (5 mg total) by mouth 2 (two) times daily. DO NOT TAKE THIS DIABETES MEDICINE IF YOUR BLOOD SUGAR IS LESS THAN 130.    . levalbuterol (XOPENEX) 0.63 MG/3ML nebulizer solution Take 3 mLs (0.63 mg total) by nebulization every 4 (four) hours as needed for wheezing or shortness of breath. 3 mL 2  . lisinopril (PRINIVIL,ZESTRIL) 2.5 MG tablet Take 2.5 mg by mouth daily.    Marland Kitchen. lovastatin (MEVACOR) 40 MG tablet Take 40 mg by mouth at bedtime. Pt should take with food.     . nitroGLYCERIN (NITROSTAT) 0.4 MG SL tablet Place 0.4 mg under the tongue every 5 (five) minutes x 3 doses as needed for chest pain. If no relief call 911 or go to emergency room.    Marland Kitchen. omeprazole (PRILOSEC) 40 MG capsule Take 40 mg by mouth daily.    . polyethylene glycol (MIRALAX) packet Take 17 g by mouth daily as needed for moderate constipation. 30 each 0  . diltiazem (CARDIZEM CD) 180 MG 24 hr capsule Take 1 capsule (180 mg total) by mouth daily. (Patient not taking: Reported on 12/06/2017) 30 capsule 0  . sildenafil (REVATIO) 20 MG tablet Take 1 tablet (20 mg total) by mouth 3 (three) times daily. (Patient not taking: Reported on 12/06/2017) 90 tablet 1     . aspirin EC  81 mg Oral Daily  . digoxin  0.125 mg Oral Daily  . diltiazem  240 mg Oral Daily  . heparin  5,000 Units Subcutaneous Q8H  . insulin aspart  0-5 Units Subcutaneous QHS  . insulin aspart  0-9 Units Subcutaneous TID WC  . lisinopril  2.5 mg Oral Daily  . mometasone-formoterol  2 puff Inhalation BID  . pantoprazole  40 mg Oral Daily  . pravastatin  40 mg Oral q1800    Infusions:   Allergies  Allergen Reactions  . Contrast Media [Iodinated Diagnostic Agents] Nausea And Vomiting  . Other Other (See Comments)    Cannot have "high doses of anesthesia because of his lungs"    Social History   Socioeconomic History  . Marital status: Married    Spouse name: Not on file  . Number of children: Not on file  . Years of education: Not  on file  . Highest education level: Not on file  Social Needs  . Financial resource strain: Not on file  . Food insecurity - worry: Not on file  . Food insecurity - inability: Not on file  . Transportation needs - medical: Not on file  . Transportation needs - non-medical: Not on file  Occupational History  . Not on file  Tobacco Use  . Smoking status: Current Every Day Smoker    Packs/day: 1.00    Years: 45.00    Pack years: 45.00    Types: Cigarettes  . Smokeless tobacco: Never Used  Substance and Sexual Activity  . Alcohol use: No  . Drug use: No  . Sexual activity: Not on file  Other Topics Concern  . Not on file  Social History Narrative  . Not on file    Family History  Problem Relation Age of Onset  . CAD Brother   . Congestive Heart Failure Brother     PHYSICAL EXAM: Vitals:   12/07/17 0523 12/07/17 0758  BP: 119/76 119/79  Pulse: 77 82  Resp: 18 14  Temp: 98.2 F (36.8 C) 97.7 F (36.5 C)  SpO2: 94% 92%     Intake/Output Summary (Last 24 hours) at 12/07/2017 1610 Last data filed at 12/07/2017 0800 Gross per 24 hour  Intake -  Output 1350 ml  Net -1350 ml    General:  Well appearing. No respiratory difficulty HEENT: normal Neck: supple. no JVD. Carotids 2+ bilat; no bruits. No lymphadenopathy or thryomegaly appreciated. Cor: PMI nondisplaced. Regular rate & rhythm. No rubs, gallops or murmurs. Lungs: clear Abdomen: soft, nontender, nondistended. No hepatosplenomegaly. No bruits or masses. Good bowel sounds. Extremities: no cyanosis, clubbing, rash, edema Neuro: alert & oriented x 3, cranial nerves grossly intact. moves all 4 extremities w/o difficulty. Affect pleasant.  ECG: Atrial fibrillation with controlled ventricular rate and right bundle branch block  Results for orders placed or performed during the hospital encounter of 12/06/17 (from the past 24 hour(s))  Basic metabolic panel     Status: Abnormal   Collection Time: 12/06/17  9:45 PM   Result Value Ref Range   Sodium 136 135 - 145 mmol/L   Potassium 4.0 3.5 - 5.1 mmol/L   Chloride 103 101 - 111 mmol/L   CO2 23 22 - 32 mmol/L   Glucose, Bld 238 (H) 65 - 99 mg/dL   BUN 26 (H) 6 - 20 mg/dL   Creatinine, Ser 9.60 (H) 0.61 - 1.24 mg/dL   Calcium 9.9 8.9 - 45.4 mg/dL   GFR calc non Af Amer 43 (L) >60 mL/min   GFR calc Af Amer 50 (L) >60 mL/min  Anion gap 10 5 - 15  CBC     Status: Abnormal   Collection Time: 12/06/17  9:45 PM  Result Value Ref Range   WBC 7.5 3.8 - 10.6 K/uL   RBC 4.72 4.40 - 5.90 MIL/uL   Hemoglobin 14.2 13.0 - 18.0 g/dL   HCT 78.243.5 95.640.0 - 21.352.0 %   MCV 92.1 80.0 - 100.0 fL   MCH 30.1 26.0 - 34.0 pg   MCHC 32.7 32.0 - 36.0 g/dL   RDW 08.616.3 (H) 57.811.5 - 46.914.5 %   Platelets 191 150 - 440 K/uL  Troponin I     Status: Abnormal   Collection Time: 12/06/17  9:45 PM  Result Value Ref Range   Troponin I 0.20 (HH) <0.03 ng/mL  Brain natriuretic peptide     Status: Abnormal   Collection Time: 12/06/17  9:45 PM  Result Value Ref Range   B Natriuretic Peptide 902.0 (H) 0.0 - 100.0 pg/mL  Troponin I     Status: Abnormal   Collection Time: 12/07/17  1:40 AM  Result Value Ref Range   Troponin I 0.20 (HH) <0.03 ng/mL  Glucose, capillary     Status: Abnormal   Collection Time: 12/07/17  2:11 AM  Result Value Ref Range   Glucose-Capillary 102 (H) 65 - 99 mg/dL  Troponin I     Status: Abnormal   Collection Time: 12/07/17  7:24 AM  Result Value Ref Range   Troponin I 0.20 (HH) <0.03 ng/mL  Basic metabolic panel     Status: Abnormal   Collection Time: 12/07/17  7:24 AM  Result Value Ref Range   Sodium 140 135 - 145 mmol/L   Potassium 3.9 3.5 - 5.1 mmol/L   Chloride 106 101 - 111 mmol/L   CO2 28 22 - 32 mmol/L   Glucose, Bld 85 65 - 99 mg/dL   BUN 22 (H) 6 - 20 mg/dL   Creatinine, Ser 6.291.36 (H) 0.61 - 1.24 mg/dL   Calcium 9.7 8.9 - 52.810.3 mg/dL   GFR calc non Af Amer 49 (L) >60 mL/min   GFR calc Af Amer 57 (L) >60 mL/min   Anion gap 6 5 - 15  CBC      Status: Abnormal   Collection Time: 12/07/17  7:24 AM  Result Value Ref Range   WBC 5.7 3.8 - 10.6 K/uL   RBC 4.46 4.40 - 5.90 MIL/uL   Hemoglobin 13.2 13.0 - 18.0 g/dL   HCT 41.341.1 24.440.0 - 01.052.0 %   MCV 92.2 80.0 - 100.0 fL   MCH 29.7 26.0 - 34.0 pg   MCHC 32.2 32.0 - 36.0 g/dL   RDW 27.216.0 (H) 53.611.5 - 64.414.5 %   Platelets 183 150 - 440 K/uL  Glucose, capillary     Status: None   Collection Time: 12/07/17  7:56 AM  Result Value Ref Range   Glucose-Capillary 77 65 - 99 mg/dL   Comment 1 Notify RN    Dg Chest 2 View  Result Date: 12/06/2017 CLINICAL DATA:  76 y/o  M; shortness of breath and chest pain. EXAM: CHEST  2 VIEW COMPARISON:  11/06/2017 chest radiograph FINDINGS: Stable cardiomegaly given projection and technique. Aortic atherosclerosis. Small bilateral pleural effusions. Bibasilar opacities. Interstitial pulmonary edema. No acute osseous abnormality is evident. IMPRESSION: Small bilateral pleural effusions and interstitial pulmonary edema. Bibasilar opacities probably represent atelectasis. Stable cardiomegaly. Electronically Signed   By: Mitzi HansenLance  Furusawa-Stratton M.D.   On: 12/06/2017 22:03   Ct Angio Chest Pe  W Or Wo Contrast  Result Date: 12/07/2017 CLINICAL DATA:  76 year old male with shortness of breath. Concern for pulmonary embolism. EXAM: CT ANGIOGRAPHY CHEST WITH CONTRAST TECHNIQUE: Multidetector CT imaging of the chest was performed using the standard protocol during bolus administration of intravenous contrast. Multiplanar CT image reconstructions and MIPs were obtained to evaluate the vascular anatomy. CONTRAST:  75mL ISOVUE-370 IOPAMIDOL (ISOVUE-370) INJECTION 76% COMPARISON:  Chest radiograph dated 12/06/2017 and chest CT dated 11/01/2015. FINDINGS: Cardiovascular: There is moderate cardiomegaly. There is dilatation of the right cardiac chamber with retrograde flow of contrast from the right atrium into the IVC consistent with right cardiac dysfunction. Correlation with  clinical exam and echocardiogram recommended. No pericardial effusion. Multi vessel coronary vascular calcification. There is mild atherosclerotic calcification of the thoracic aorta. The aorta is grossly unremarkable for the degree of enhancement. There is dilatation of the main pulmonary trunk suggestive of underlying pulmonary hypertension. Evaluation of the pulmonary artery is is somewhat limited due to respiratory motion artifact. Sequela of old pulmonary emboli with peripheral thrombus/scarring of the right lower lobe pulmonary artery branches as well as an area of scarring in the left upper lobe pulmonary artery as seen on the prior CT of 2016. There is apparent increased thickening/scarring of the walls of the right lower lobe pulmonary artery branches compared to the prior CT. No definite acute embolus identified. Mediastinum/Nodes: Right hilar density extending along the right lower lobe pulmonary artery may be along the arterial wall although lymphadenopathy or a mass is not entirely excluded. Lungs/Pleura: There is severe emphysema. Large subpleural blebs noted. There is a moderate right and small left pleural effusions, increased in size compared to the prior CT. There is associated compressive atelectasis of the lower lobes. Pneumonia is not excluded. There is no pneumothorax. The central airways are patent. Small amount of mucus secretion noted in the trachea. Upper Abdomen: Mild irregularity of the liver contour likely related to passive congestion. Musculoskeletal: Diffuse subcutaneous edema and anasarca. There is osteopenia with degenerative changes of the spine. No acute osseous pathology. Review of the MIP images confirms the above findings. IMPRESSION: 1. No definite CT evidence of acute PE. Sequela of prior PE with thickening and scarring along the walls of the pulmonary artery branches. 2. Severe emphysema. Moderate right and small left pleural effusions, increased compared to prior  radiograph. Bibasilar atelectasis versus infiltrate. 3. Moderate cardiomegaly with findings of right heart dysfunction. 4. Coronary vascular calcification. 5. Aortic Atherosclerosis (ICD10-I70.0) and Emphysema (ICD10-J43.9). Electronically Signed   By: Elgie Collard M.D.   On: 12/07/2017 01:53   US Venous Img Lower Unilateral Right  Result Date: 12/06/2017 CLINICAL DATA:  Right leg pain and edema for 2 weeks EXAM: Right LOWER EXTREMITY VENOUS DOPPLER ULTRASOUND TECHNIQUE: Gray-scale sonography with graded compression, as well as color Doppler and duplex ultrasound were performed to evaluate the lower extremity deep venous systems from the level of the common femoral vein and including the common femoral, femoral, profunda femoral, popliteal and calf veins including the posterior tibial, peroneal and gastrocnemius veins when visible. The superficial great saphenous vein was also interrogated. Spectral Doppler was utilized to evaluate flow at rest and with distal augmentation maneuvers in the common femoral, femoral and popliteal veins. COMPARISON:  04/11/2015 FINDINGS: Contralateral Common Femoral Vein: Respiratory phasicity is normal and symmetric with the symptomatic side. No evidence of thrombus. Normal compressibility. Common Femoral Vein: No evidence of thrombus. Normal compressibility, respiratory phasicity and response to augmentation. Saphenofemoral Junction: No evidence of thrombus. Normal  compressibility and flow on color Doppler imaging. Profunda Femoral Vein: No evidence of thrombus. Normal compressibility and flow on color Doppler imaging. Femoral Vein: No evidence of thrombus. Normal compressibility, respiratory phasicity and response to augmentation. Popliteal Vein: Echogenic filling defects within the right popliteal vein, similar in appearance to the 2016 venous ultrasound. Calf Veins: Poorly visualized due to edema. Superficial Great Saphenous Vein: No evidence of thrombus. Normal  compressibility. Venous Reflux:  None. Other Findings:  None. 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 No definite acute DVT is seen. Poorly visible calf vessels due to edema. Electronically Signed   By: Jasmine Pang M.D.   On: 12/06/2017 23:55     ASSESSMENT AND PLAN: Congestive heart failure with history of diastolic dysfunction and pulmonary embolism and atrial fibrillation. Chest pain appears to be atypical but does have mildly elevated troponin. Advise IV Lasix 40 mg every 12. May have to do catheterization if troponin keeps climbing.  Eliah Marquard A

## 2017-12-07 NOTE — Progress Notes (Signed)
[employer] [facility] [place] [facility]   PATIENT NAME: Rosemary Pentecost    MR#:  [id number]  DATE OF BIRTH:  1941/05/18  SUBJECTIVE:  CHIEF COMPLAINT:   Chief Complaint  Patient presents with  . Shortness of Breath     Came with leg edema and worsening SOB, feels little better, still on 4 ltr oxygen, at home on 2 ltr oxygen.  REVIEW OF SYSTEMS:  CONSTITUTIONAL: No fever, fatigue or weakness.  EYES: No blurred or double vision.  EARS, NOSE, AND THROAT: No tinnitus or ear pain.  RESPIRATORY: No cough,have shortness of breath, no wheezing or hemoptysis.  CARDIOVASCULAR: No chest pain, orthopnea, edema.  GASTROINTESTINAL: No nausea, vomiting, diarrhea or abdominal pain.  GENITOURINARY: No dysuria, hematuria.  ENDOCRINE: No polyuria, nocturia,  HEMATOLOGY: No anemia, easy bruising or bleeding SKIN: No rash or lesion. MUSCULOSKELETAL: No joint pain or arthritis.   NEUROLOGIC: No tingling, numbness, weakness.  PSYCHIATRY: No anxiety or depression.   ROS  DRUG ALLERGIES:   Allergies  Allergen Reactions  . Contrast Media [Iodinated Diagnostic Agents] Nausea And Vomiting  . Other Other (See Comments)    Cannot have "high doses of anesthesia because of his lungs"    VITALS:  Blood pressure 119/79, pulse 82, temperature 97.7 F (36.5 C), temperature source Oral, resp. rate 14, height 6\' 1"  (1.854 m), weight 85.7 kg (188 lb 14.4 oz), SpO2 92 %.  PHYSICAL EXAMINATION:  GENERAL:  76 y.o.-year-old patient lying in the bed with no acute distress.  EYES: Pupils equal, round, reactive to light and accommodation. No scleral icterus. Extraocular muscles intact.  HEENT: Head atraumatic, normocephalic. Oropharynx and nasopharynx clear.  NECK:  Supple, no jugular venous distention. No thyroid enlargement, no tenderness.  LUNGS: Normal breath sounds bilaterally, no wheezing, bilateral crepitation. No use of accessory muscles of respiration. Oxygen via nasal canula. CARDIOVASCULAR:  S1, S2 normal. No murmurs, rubs, or gallops.  ABDOMEN: Soft, nontender, nondistended. Bowel sounds present. No organomegaly or mass.  EXTREMITIES: some pedal edema, cyanosis, or clubbing.  NEUROLOGIC: Cranial nerves II through XII are intact. Muscle strength 4/5 in all extremities. Sensation intact. Gait not checked.  PSYCHIATRIC: The patient is alert and oriented x 3.  SKIN: No obvious rash, lesion, or ulcer.   Physical Exam LABORATORY PANEL:   CBC Recent Labs  Lab 12/07/17 0724  WBC 5.7  HGB 13.2  HCT 41.1  PLT 183   ------------------------------------------------------------------------------------------------------------------  Chemistries  Recent Labs  Lab 12/07/17 0724  NA 140  K 3.9  CL 106  CO2 28  GLUCOSE 85  BUN 22*  CREATININE 1.36*  CALCIUM 9.7   ------------------------------------------------------------------------------------------------------------------  Cardiac Enzymes Recent Labs  Lab 12/07/17 0724 12/07/17 1344  TROPONINI 0.20* 0.19*   ------------------------------------------------------------------------------------------------------------------  RADIOLOGY:  Dg Chest 2 View  Result Date: 12/06/2017 CLINICAL DATA:  76 y/o  M; shortness of breath and chest pain. EXAM: CHEST  2 VIEW COMPARISON:  11/06/2017 chest radiograph FINDINGS: Stable cardiomegaly given projection and technique. Aortic atherosclerosis. Small bilateral pleural effusions. Bibasilar opacities. Interstitial pulmonary edema. No acute osseous abnormality is evident. IMPRESSION: Small bilateral pleural effusions and interstitial pulmonary edema. Bibasilar opacities probably represent atelectasis. Stable cardiomegaly. Electronically Signed   By: Mitzi Hansen M.D.   On: 12/06/2017 22:03   Ct Angio Chest Pe W Or Wo Contrast  Result Date: 12/07/2017 CLINICAL DATA:  76 year old male with shortness of breath. Concern for pulmonary embolism. EXAM: CT ANGIOGRAPHY CHEST  WITH CONTRAST TECHNIQUE: Multidetector CT imaging of the chest  was performed using the standard protocol during bolus administration of intravenous contrast. Multiplanar CT image reconstructions and MIPs were obtained to evaluate the vascular anatomy. CONTRAST:  75mL ISOVUE-370 IOPAMIDOL (ISOVUE-370) INJECTION 76% COMPARISON:  Chest radiograph dated 12/06/2017 and chest CT dated 11/01/2015. FINDINGS: Cardiovascular: There is moderate cardiomegaly. There is dilatation of the right cardiac chamber with retrograde flow of contrast from the right atrium into the IVC consistent with right cardiac dysfunction. Correlation with clinical exam and echocardiogram recommended. No pericardial effusion. Multi vessel coronary vascular calcification. There is mild atherosclerotic calcification of the thoracic aorta. The aorta is grossly unremarkable for the degree of enhancement. There is dilatation of the main pulmonary trunk suggestive of underlying pulmonary hypertension. Evaluation of the pulmonary artery is is somewhat limited due to respiratory motion artifact. Sequela of old pulmonary emboli with peripheral thrombus/scarring of the right lower lobe pulmonary artery branches as well as an area of scarring in the left upper lobe pulmonary artery as seen on the prior CT of 2016. There is apparent increased thickening/scarring of the walls of the right lower lobe pulmonary artery branches compared to the prior CT. No definite acute embolus identified. Mediastinum/Nodes: Right hilar density extending along the right lower lobe pulmonary artery may be along the arterial wall although lymphadenopathy or a mass is not entirely excluded. Lungs/Pleura: There is severe emphysema. Large subpleural blebs noted. There is a moderate right and small left pleural effusions, increased in size compared to the prior CT. There is associated compressive atelectasis of the lower lobes. Pneumonia is not excluded. There is no pneumothorax. The  central airways are patent. Small amount of mucus secretion noted in the trachea. Upper Abdomen: Mild irregularity of the liver contour likely related to passive congestion. Musculoskeletal: Diffuse subcutaneous edema and anasarca. There is osteopenia with degenerative changes of the spine. No acute osseous pathology. Review of the MIP images confirms the above findings. IMPRESSION: 1. No definite CT evidence of acute PE. Sequela of prior PE with thickening and scarring along the walls of the pulmonary artery branches. 2. Severe emphysema. Moderate right and small left pleural effusions, increased compared to prior radiograph. Bibasilar atelectasis versus infiltrate. 3. Moderate cardiomegaly with findings of right heart dysfunction. 4. Coronary vascular calcification. 5. Aortic Atherosclerosis (ICD10-I70.0) and Emphysema (ICD10-J43.9). Electronically Signed   By: Elgie CollardArash  Radparvar M.D.   On: 12/07/2017 01:53   [place] Venous Img Lower Unilateral Right  Result Date: 12/06/2017 CLINICAL DATA:  Right leg pain and edema for 2 weeks EXAM: Right LOWER EXTREMITY VENOUS DOPPLER ULTRASOUND TECHNIQUE: Gray-scale sonography with graded compression, as well as color Doppler and duplex ultrasound were performed to evaluate the lower extremity deep venous systems from the level of the common femoral vein and including the common femoral, femoral, profunda femoral, popliteal and calf veins including the posterior tibial, peroneal and gastrocnemius veins when visible. The superficial great saphenous vein was also interrogated. Spectral Doppler was utilized to evaluate flow at rest and with distal augmentation maneuvers in the common femoral, femoral and popliteal veins. COMPARISON:  04/11/2015 FINDINGS: Contralateral Common Femoral Vein: Respiratory phasicity is normal and symmetric with the symptomatic side. No evidence of thrombus. Normal compressibility. Common Femoral Vein: No evidence of thrombus. Normal compressibility,  respiratory phasicity and response to augmentation. Saphenofemoral Junction: No evidence of thrombus. Normal compressibility and flow on color Doppler imaging. Profunda Femoral Vein: No evidence of thrombus. Normal compressibility and flow on color Doppler imaging. Femoral Vein: No evidence of thrombus. Normal compressibility, respiratory phasicity and response  to augmentation. Popliteal Vein: Echogenic filling defects within the right popliteal vein, similar in appearance to the 2016 venous ultrasound. Calf Veins: Poorly visualized due to edema. Superficial Great Saphenous Vein: No evidence of thrombus. Normal compressibility. Venous Reflux:  None. Other Findings:  None. 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 No definite acute DVT is seen. Poorly visible calf vessels due to edema. Electronically Signed   By: Jasmine PangKim  Fujinaga M.D.   On: 12/06/2017 23:55    ASSESSMENT AND PLAN:   Principal Problem:   Acute on chronic diastolic CHF (congestive heart failure) (HCC) Active Problems:   GERD (gastroesophageal reflux disease)   COPD (chronic obstructive pulmonary disease) (HCC)   Essential hypertension   Diabetes mellitus (HCC)   Chronic atrial fibrillation (HCC)   * Acute on chronic diastolic CHF (congestive heart failure) (HCC) -BNP was significantly elevated in the ED.  Troponin was also 0.2.  Lasix given in the ED.  Cardiology consult appreciated- as troponin stable, no need for further work ups.    negative CT angiogram for PE.  *  COPD (chronic obstructive pulmonary disease) (HCC) -continue home dose inhalers * Essential hypertension -continue home meds *  Diabetes mellitus (HCC) -sliding scale insulin with corresponding glucose checks.  Patient is on metformin, but looking at his history of chronic kidney disease - stopped. *  Chronic atrial  fibrillation (HCC) -continue home rate controlling medications *  GERD (gastroesophageal reflux disease) -home dose PPI   All the records are reviewed and case discussed with Care Management/Social Workerr. Management plans discussed with the patient, family and they are in agreement.  CODE STATUS: Full.  TOTAL TIME TAKING CARE OF THIS PATIENT: 35 minutes.   POSSIBLE D/C IN 1-2 DAYS, DEPENDING ON CLINICAL CONDITION.   Altamese DillingVaibhavkumar Jerman Tinnon M.D on 12/07/2017   Between 7am to 6pm - Pager - [phone]  After 6pm go to [email or web address] - Social research officer, governmentpassword EPAS [facility]  [facility] [place] [facility]  Office  [phone]  CC: Primary care physician; [facility]  Note: This dictation was prepared with Dragon dictation along with smaller phrase technology. Any transcriptional errors that result from this process are unintentional.

## 2017-12-07 NOTE — Plan of Care (Signed)
Patient was transferred from the Er following c/o SOB and bilateral lower extremities edema. On admission patient was A&O X4, was able to stand independently on the weighing scale with one assist. Patient lower extremities were red, weeping and has  skin peel. Patient was on 4L of supplemental oxygen, asymptomatic afib and denied pain on admission. Patient rested overnight, his oxygen was weaned down to 3L

## 2017-12-07 NOTE — Consult Note (Signed)
WOC Nurse wound consult note Reason for Consult: Lower extremity edema and shortness of breath.  Chronic DVT's noted.  Erythema and tender to touch Wound type: Cellulitis to lower legs Pressure Injury POA: NA Measurement: Left anterior lower leg:  0.5 cm x 0.1 cm  Right anterior lower leg:  0.5 cm x 0.1 cm  Wound bed: Ruddy red Drainage (amount, consistency, odor) minimal serosanguinous  No odor Periwound: Edema and erythema to lower legs.  Tender to touch Dressing procedure/placement/frequency: Cleanse left leg with NS and pat dry.  Apply mupirocin ointment to wound bed twice daily.  Silicone border foam dressing to nonintact lesions.    Will not follow at this time.  Please re-consult if needed.  Maple HudsonKaren Kyrese Gartman RN BSN CWON Pager [phone]

## 2017-12-07 NOTE — Care Management Note (Signed)
Case Management Note  Patient Details  Name: Austin [name] MRN: [id number] Date of Birth: 1941/05/08  Subjective/Objective:                 Admitted from home with congestive heart failure. Chronic home oxygen.  Wife declined home health at time of last discharge 10/2017. Third admission within past 6 months..  Has had [facility] in the past and if was to have home health, this would be the agency preference.   Action/Plan:  [facility] patient with medicare uhc  Expected Discharge Date:                  Expected Discharge Plan:     In-House Referral:     Discharge planning Services     Post Acute Care Choice:    Choice offered to:     DME Arranged:    DME Agency:     HH Arranged:    HH Agency:     Status of Service:     If discussed at [employer] Meetings, dates discussed:    Additional Comments:  Eber HongGreene, Kayde Atkerson R, RN 12/07/2017, 8:35 AM

## 2017-12-07 NOTE — Plan of Care (Signed)
  Progressing Education: Knowledge of General Education information will improve 12/07/2017 0236 - Progressing by Kerrie BuffaloBakare, Antonio Woodhams Muili, RN 12/07/2017 0227 - Progressing by Kerrie BuffaloBakare, Jacquelina Hewins Muili, RN Health Behavior/Discharge Planning: Ability to manage health-related needs will improve 12/07/2017 0236 - Progressing by Kerrie BuffaloBakare, Jeorgia Helming Muili, RN 12/07/2017 0227 - Progressing by Kerrie BuffaloBakare, Kaio Kuhlman Muili, RN Clinical Measurements: Ability to maintain clinical measurements within normal limits will improve 12/07/2017 0236 - Progressing by Kerrie BuffaloBakare, Maxine Huynh Muili, RN 12/07/2017 0227 - Progressing by Kerrie BuffaloBakare, Overton Boggus Muili, RN Will remain free from infection 12/07/2017 0236 - Progressing by Kerrie BuffaloBakare, Saraann Enneking Muili, RN 12/07/2017 0227 - Progressing by Kerrie BuffaloBakare, Jullia Mulligan Muili, RN Diagnostic test results will improve 12/07/2017 0236 - Progressing by Kerrie BuffaloBakare, Jet Traynham Muili, RN 12/07/2017 0227 - Progressing by Kerrie BuffaloBakare, Orland Visconti Muili, RN Respiratory complications will improve 12/07/2017 0236 - Progressing by Kerrie BuffaloBakare, Adonna Horsley Muili, RN 12/07/2017 0227 - Progressing by Kerrie BuffaloBakare, Dominyck Reser Muili, RN Cardiovascular complication will be avoided 12/07/2017 0236 - Progressing by Kerrie BuffaloBakare, Aerith Canal Muili, RN 12/07/2017 0227 - Progressing by Kerrie BuffaloBakare, Meenakshi Sazama Muili, RN Activity: Risk for activity intolerance will decrease 12/07/2017 0236 - Progressing by Kerrie BuffaloBakare, Kingson Lohmeyer Muili, RN 12/07/2017 0227 - Progressing by Kerrie BuffaloBakare, Napoleon Monacelli Muili, RN Nutrition: Adequate nutrition will be maintained 12/07/2017 0236 - Progressing by Kerrie BuffaloBakare, Cherokee Clowers Muili, RN 12/07/2017 0227 - Progressing by Kerrie BuffaloBakare, Barnaby Rippeon Muili, RN Coping: Level of anxiety will decrease 12/07/2017 0236 - Progressing by Kerrie BuffaloBakare, Carliss Porcaro Muili, RN 12/07/2017 0227 - Progressing by Kerrie BuffaloBakare, Rishita Petron Muili, RN Elimination: Will not experience complications related to bowel motility 12/07/2017 0236 - Progressing by Kerrie BuffaloBakare, Alizia Greif Muili, RN 12/07/2017  0227 - Progressing by Kerrie BuffaloBakare, Babak Lucus Muili, RN Will not experience complications related to urinary retention 12/07/2017 0236 - Progressing by Kerrie BuffaloBakare, Suliman Termini Muili, RN 12/07/2017 0227 - Progressing by Kerrie BuffaloBakare, Lerone Onder Muili, RN Pain Managment: General experience of comfort will improve 12/07/2017 0236 - Progressing by Kerrie BuffaloBakare, Kanoe Wanner Muili, RN 12/07/2017 0227 - Progressing by Kerrie BuffaloBakare, Donisha Hoch Muili, RN Safety: Ability to remain free from injury will improve 12/07/2017 0236 - Progressing by Kerrie BuffaloBakare, Damarien Nyman Muili, RN 12/07/2017 0227 - Progressing by Kerrie BuffaloBakare, Raza Bayless Muili, RN Skin Integrity: Risk for impaired skin integrity will decrease 12/07/2017 0236 - Progressing by Kerrie BuffaloBakare, Negin Hegg Muili, RN 12/07/2017 0227 - Progressing by Kerrie BuffaloBakare, Anel Purohit Muili, RN

## 2017-12-08 ENCOUNTER — Inpatient Hospital Stay: Payer: Medicare Other

## 2017-12-08 LAB — BLOOD GAS, ARTERIAL
ACID-BASE EXCESS: 2.6 mmol/L — AB (ref 0.0–2.0)
BICARBONATE: 27.2 mmol/L (ref 20.0–28.0)
FIO2: 0.4
O2 Saturation: 86.1 %
PATIENT TEMPERATURE: 37
PH ART: 7.43 (ref 7.350–7.450)
PO2 ART: 50 mmHg — AB (ref 83.0–108.0)
pCO2 arterial: 41 mmHg (ref 32.0–48.0)

## 2017-12-08 LAB — BASIC METABOLIC PANEL
ANION GAP: 7 (ref 5–15)
BUN: 31 mg/dL — ABNORMAL HIGH (ref 6–20)
CALCIUM: 9.2 mg/dL (ref 8.9–10.3)
CO2: 28 mmol/L (ref 22–32)
CREATININE: 1.68 mg/dL — AB (ref 0.61–1.24)
Chloride: 101 mmol/L (ref 101–111)
GFR, EST AFRICAN AMERICAN: 44 mL/min — AB (ref >60)
GFR, EST NON AFRICAN AMERICAN: 38 mL/min — AB (ref >60)
GLUCOSE: 122 mg/dL — AB (ref 65–99)
Potassium: 4.1 mmol/L (ref 3.5–5.1)
Sodium: 136 mmol/L (ref 135–145)

## 2017-12-08 LAB — GLUCOSE, CAPILLARY
GLUCOSE-CAPILLARY: 101 mg/dL — AB (ref 65–99)
GLUCOSE-CAPILLARY: 227 mg/dL — AB (ref 65–99)
Glucose-Capillary: 222 mg/dL — ABNORMAL HIGH (ref 65–99)
Glucose-Capillary: 85 mg/dL (ref 65–99)
Glucose-Capillary: 272 mg/dL — ABNORMAL HIGH (ref 65–99)

## 2017-12-08 MED ORDER — BUDESONIDE 0.5 MG/2ML IN SUSP
0.5000 mg | Freq: Two times a day (BID) | RESPIRATORY_TRACT | Status: DC
  Administered 2017-12-08 – 2017-12-13 (×9): 0.5 mg via RESPIRATORY_TRACT
  Filled 2017-12-08 (×9): qty 2

## 2017-12-08 MED ORDER — MUPIROCIN CALCIUM 2 % EX CREA
TOPICAL_CREAM | Freq: Two times a day (BID) | CUTANEOUS | Status: DC
  Administered 2017-12-08 – 2017-12-13 (×11): via TOPICAL
  Filled 2017-12-08: qty 15

## 2017-12-08 MED ORDER — IPRATROPIUM-ALBUTEROL 0.5-2.5 (3) MG/3ML IN SOLN
3.0000 mL | RESPIRATORY_TRACT | Status: DC
  Administered 2017-12-08 – 2017-12-09 (×4): 3 mL via RESPIRATORY_TRACT
  Filled 2017-12-08 (×4): qty 3

## 2017-12-08 MED ORDER — DEXTROSE 5 % IV SOLN
250.0000 mg | INTRAVENOUS | Status: DC
  Administered 2017-12-08: 250 mg via INTRAVENOUS
  Filled 2017-12-08: qty 250

## 2017-12-08 MED ORDER — METHYLPREDNISOLONE SODIUM SUCC 40 MG IJ SOLR
40.0000 mg | Freq: Two times a day (BID) | INTRAMUSCULAR | Status: DC
  Administered 2017-12-08 – 2017-12-10 (×4): 40 mg via INTRAVENOUS
  Filled 2017-12-08 (×4): qty 1

## 2017-12-08 MED ORDER — FUROSEMIDE 10 MG/ML IJ SOLN
60.0000 mg | Freq: Once | INTRAMUSCULAR | Status: AC
  Administered 2017-12-08: 60 mg via INTRAVENOUS
  Filled 2017-12-08: qty 6

## 2017-12-08 MED ORDER — AZITHROMYCIN 250 MG PO TABS: 500.0000 mg | ORAL_TABLET | Freq: Every day | ORAL | Status: DC

## 2017-12-08 MED ORDER — DEXTROSE 5 % IV SOLN
1.0000 g | INTRAVENOUS | Status: DC
  Administered 2017-12-08 – 2017-12-13 (×6): 1 g via INTRAVENOUS
  Filled 2017-12-08 (×6): qty 10

## 2017-12-08 MED ORDER — DEXTROSE 5 % IV SOLN
250.0000 mg | INTRAVENOUS | Status: AC
  Administered 2017-12-09: 250 mg via INTRAVENOUS
  Filled 2017-12-08: qty 250

## 2017-12-08 MED ORDER — FUROSEMIDE 10 MG/ML IJ SOLN
20.0000 mg | Freq: Once | INTRAMUSCULAR | Status: AC
  Administered 2017-12-08: 20 mg via INTRAVENOUS

## 2017-12-08 NOTE — Progress Notes (Signed)
MD notified of pt condition, 20mg  IV lasix ordered once, with ABG and Chest xray , MD at bedside will prescribe antibiotics at this time. O2 sats 91% at this time on 4 L nasal cannula.. RR 20.

## 2017-12-08 NOTE — Progress Notes (Signed)
Notified by CCMD Pt O2 saturations at 86%, pt nasal cannula not properly on at time of assessment, adjusted cannula, and pt sats up to 92%, administered PRN neb treatment, pt is very weak and lethargic. CBG check, 222.

## 2017-12-08 NOTE — Evaluation (Signed)
Physical Therapy Evaluation Patient Details Name: Austin [name] MRN: [id number] DOB: 1941/06/21 Today's Date: 12/08/2017   History of Present Illness  76 yo male with onset of CHF and SOB, elevated troponin and AMS with COPD exacerbation.  PMHx:  HTN, a-fib, CHF, DM, COPD, GERD,   Clinical Impression  Pt is up to walk with assistance and noted his gait is buckled in appearance, and struggles for safe transition with walker unless assisted to cue all steps.  He is expecting to be referred to SNF and agreed to this, notes that he feels weak and with his wife's upcoming treatments will not have reliable assistance before he goes home.  Follow acutely for strengthening and endurance training along with balance work as pt can control standing to tolerate.    Follow Up Recommendations SNF    Equipment Recommendations  Rolling walker with 5" wheels    Recommendations for Other Services       Precautions / Restrictions Precautions Precautions: Fall(telemetry) Precaution Comments: ck O2 sats with effort Restrictions Weight Bearing Restrictions: No      Mobility  Bed Mobility Overal bed mobility: Needs Assistance Bed Mobility: Supine to Sit     Supine to sit: Min assist;Mod assist     General bed mobility comments: supported under his trunk to sit up and then pivoted bed pad to move to EOB  Transfers Overall transfer level: Needs assistance Equipment used: Rolling walker (2 wheeled);1 person hand held assist Transfers: Sit to/from Stand Sit to Stand: Min assist;Mod assist         General transfer comment: reminders for hand placement, has trouble fully straightening out to stand  Ambulation/Gait Ambulation/Gait assistance: Min assist Ambulation Distance (Feet): 5 Feet Assistive device: Rolling walker (2 wheeled);1 person hand held assist Gait Pattern/deviations: Step-through pattern;Step-to pattern;Decreased stride length;Wide base of support;Trunk  flexed;Shuffle Gait velocity: reduced Gait velocity interpretation: Below normal speed for age/gender General Gait Details: weak and needs dense cues for transition to turn and sit in the chair, hand over hand to reach back to chair  Stairs            Wheelchair Mobility    Modified Rankin (Stroke Patients Only)       Balance Overall balance assessment: Needs assistance;History of Falls Sitting-balance support: Bilateral upper extremity supported;Feet supported Sitting balance-Leahy Scale: Fair     Standing balance support: Bilateral upper extremity supported;During functional activity Standing balance-Leahy Scale: Poor                               Pertinent Vitals/Pain Pain Assessment: No/denies pain    Home Living Family/patient expects to be discharged to:: Private residence Living Arrangements: Spouse/significant other;Other relatives Available Help at Discharge: Family Type of Home: House Home Access: Stairs to enter Entrance Stairs-Rails: None Entrance Stairs-Number of Steps: 2 Home Layout: One level Home Equipment: Walker - 2 wheels;Cane - single point Additional Comments: [facility]    Prior Function Level of Independence: Independent with assistive device(s)         Comments: drove and was doing his self care and that his wife is not in good health, starting chemo soon     Hand Dominance   Dominant Hand: Right    Extremity/Trunk Assessment   Upper Extremity Assessment Upper Extremity Assessment: Generalized weakness    Lower Extremity Assessment Lower Extremity Assessment: Generalized weakness    Cervical / Trunk  Assessment Cervical / Trunk Assessment: Kyphotic  Communication   Communication: No difficulties  Cognition Arousal/Alertness: Awake/alert Behavior During Therapy: WFL for tasks assessed/performed Overall Cognitive Status: No family/caregiver present to determine baseline cognitive  functioning                                 General Comments: has some difficulty with PMHx but is making the effort to remember      General Comments      Exercises General Exercises - Lower Extremity Ankle Circles/Pumps: AROM;Strengthening;5 reps Long Arc Quad: Strengthening;Both;10 reps Heel Slides: Strengthening;Both;10 reps   Assessment/Plan    PT Assessment Patient needs continued PT services  PT Problem List Decreased strength;Decreased range of motion;Decreased activity tolerance;Decreased balance;Decreased mobility;Decreased coordination;Decreased knowledge of use of DME;Decreased safety awareness;Decreased knowledge of precautions;Cardiopulmonary status limiting activity;Decreased skin integrity       PT Treatment Interventions DME instruction;Gait training;Stair training;Functional mobility training;Therapeutic activities;Therapeutic exercise;Balance training;Neuromuscular re-education;Patient/family education    PT Goals (Current goals can be found in the Care Plan section)  Acute Rehab PT Goals Patient Stated Goal: Pt asked to get home and get stronger PT Goal Formulation: With patient Time For Goal Achievement: 12/22/17 Potential to Achieve Goals: Good    Frequency Min 2X/week   Barriers to discharge Decreased caregiver support wife is beginning chemotherapy    Co-evaluation               AM-PAC PT "6 Clicks" Daily Activity  Outcome Measure Difficulty turning over in bed (including adjusting bedclothes, sheets and blankets)?: A Lot Difficulty moving from lying on back to sitting on the side of the bed? : Unable Difficulty sitting down on and standing up from a chair with arms (e.g., wheelchair, bedside commode, etc,.)?: Unable Help needed moving to and from a bed to chair (including a wheelchair)?: A Lot Help needed walking in hospital room?: A Lot Help needed climbing 3-5 steps with a railing? : Total 6 Click Score: 9    End of  Session Equipment Utilized During Treatment: Gait belt Activity Tolerance: Patient limited by fatigue;Patient limited by lethargy;Other (comment)(SOB to get to chair) Patient left: in chair;with call bell/phone within reach;with chair alarm set;with nursing/sitter in room Nurse Communication: Mobility status PT Visit Diagnosis: Unsteadiness on feet (R26.81);Muscle weakness (generalized) (M62.81);History of falling (Z91.81);Difficulty in walking, not elsewhere classified (R26.2);Adult, failure to thrive (R62.7)    Time: [id number] PT Time Calculation (min) (ACUTE ONLY): 22 min   Charges:   PT Evaluation $PT Eval Moderate Complexity: 1 Mod     PT G Codes:   PT G-Codes **NOT FOR INPATIENT CLASS** Functional Assessment Tool Used: AM-PAC 6 Clicks Basic Mobility   Ivar DrapeRuth E Dorathy Stallone 12/08/2017, 5:34 PM   Samul Dadauth Shavonta Gossen, PT MS Acute Rehab Dept. Number: [facility] [id number] and [facility] [phone]

## 2017-12-08 NOTE — Progress Notes (Signed)
PHARMACIST - PHYSICIAN COMMUNICATION DR:   Elisabeth PigeonVachhani CONCERNING: Antibiotic IV to Oral Route Change Policy  RECOMMENDATION: This patient is receiving azitromycin by the intravenous route.  Based on criteria approved by the Pharmacy and Therapeutics Committee, the antibiotic(s) is/are being converted to the equivalent oral dose form(s).   DESCRIPTION: These criteria include:  Patient being treated for a respiratory tract infection, urinary tract infection, cellulitis or clostridium difficile associated diarrhea if on metronidazole  The patient is not neutropenic and does not exhibit a GI malabsorption state  The patient is eating (either orally or via tube) and/or has been taking other orally administered medications for a least 24 hours  The patient is improving clinically and has a Tmax < 100.5  If you have questions about this conversion, please contact the Pharmacy Department  []   [phone]( [phone] )  [facility] [x]   [phone]( [phone] )  [facility] []   [phone]( [phone]  [facility] []   [phone]( [phone]  [facility] []   [phone]( [phone] )  [facility]

## 2017-12-08 NOTE — Progress Notes (Signed)
[employer] -  at [facility]   PATIENT NAME: Austin [name]    MR#:  [id number]  DATE OF BIRTH:  11-18-1941  SUBJECTIVE:  CHIEF COMPLAINT:   Chief Complaint  Patient presents with  . Shortness of Breath     Came with leg edema and worsening SOB,  on 4 ltr oxygen, at home on 2 ltr oxygen.  More lethargic today, appears in some respi distress.  REVIEW OF SYSTEMS:  CONSTITUTIONAL: No fever, fatigue or weakness.  EYES: No blurred or double vision.  EARS, NOSE, AND THROAT: No tinnitus or ear pain.  RESPIRATORY: No cough,have shortness of breath, no wheezing or hemoptysis.  CARDIOVASCULAR: No chest pain, orthopnea, edema.  GASTROINTESTINAL: No nausea, vomiting, diarrhea or abdominal pain.  GENITOURINARY: No dysuria, hematuria.  ENDOCRINE: No polyuria, nocturia,  HEMATOLOGY: No anemia, easy bruising or bleeding SKIN: No rash or lesion. MUSCULOSKELETAL: No joint pain or arthritis.   NEUROLOGIC: No tingling, numbness, weakness.  PSYCHIATRY: No anxiety or depression.   ROS  DRUG ALLERGIES:   Allergies  Allergen Reactions  . Contrast Media [Iodinated Diagnostic Agents] Nausea And Vomiting  . Other Other (See Comments)    Cannot have "high doses of anesthesia because of his lungs"    VITALS:  Blood pressure 126/87, pulse 68, temperature 97.6 F (36.4 C), temperature source Oral, resp. rate 18, height 6\' 1"  (1.854 m), weight 89.1 kg (196 lb 6.4 oz), SpO2 97 %.  PHYSICAL EXAMINATION:  GENERAL:  76 y.o.-year-old patient lying in the bed with lethargy and acute distress.  EYES: Pupils equal, round, reactive to light and accommodation. No scleral icterus. Extraocular muscles intact.  HEENT: Head atraumatic, normocephalic. Oropharynx and nasopharynx clear.  NECK:  Supple, no jugular venous distention. No thyroid enlargement, no tenderness.  LUNGS: Normal breath sounds bilaterally, no wheezing, bilateral crepitation. some use of accessory muscles of respiration.  Oxygen via nasal canula. CARDIOVASCULAR: S1, S2 normal. No murmurs, rubs, or gallops.  ABDOMEN: Soft, nontender, nondistended. Bowel sounds present. No organomegaly or mass.  EXTREMITIES: some pedal edema, redness, no cyanosis, or clubbing.  NEUROLOGIC: Cranial nerves II through XII are intact. Muscle strength 4/5 in all extremities. Sensation intact. Gait not checked.  PSYCHIATRIC: The patient is lethargic, but arousable and oriented X 2 on arousal briefly.Marland Kitchen.  SKIN: No obvious rash, lesion, or ulcer.   Physical Exam LABORATORY PANEL:   CBC Recent Labs  Lab 12/07/17 0724  WBC 5.7  HGB 13.2  HCT 41.1  PLT 183   ------------------------------------------------------------------------------------------------------------------  Chemistries  Recent Labs  Lab 12/08/17 0602  NA 136  K 4.1  CL 101  CO2 28  GLUCOSE 122*  BUN 31*  CREATININE 1.68*  CALCIUM 9.2   ------------------------------------------------------------------------------------------------------------------  Cardiac Enzymes Recent Labs  Lab 12/07/17 0724 12/07/17 1344  TROPONINI 0.20* 0.19*   ------------------------------------------------------------------------------------------------------------------  RADIOLOGY:  Dg Chest 1 View  Result Date: 12/08/2017 CLINICAL DATA:  Interstitial markings are diffusely coarsened with chronic features. EXAM: CHEST 1 VIEW COMPARISON:  12/06/2017. FINDINGS: 1208 hours. Lordotic positioning. Vascular congestion without overt pulmonary edema. There is collapse/consolidation at the right base with small bilateral pleural effusion. Bones are diffusely demineralized. Telemetry leads overlie the chest. IMPRESSION: Cardiomegaly with right base collapse/ consolidation and small bilateral pleural effusions. Electronically Signed   By: Kennith CenterEric  Mansell M.D.   On: 12/08/2017 12:49   Dg Chest 2 View  Result Date: 12/06/2017 CLINICAL DATA:  76 y/o  M; shortness of breath and chest  pain. EXAM: CHEST  2  VIEW COMPARISON:  11/06/2017 chest radiograph FINDINGS: Stable cardiomegaly given projection and technique. Aortic atherosclerosis. Small bilateral pleural effusions. Bibasilar opacities. Interstitial pulmonary edema. No acute osseous abnormality is evident. IMPRESSION: Small bilateral pleural effusions and interstitial pulmonary edema. Bibasilar opacities probably represent atelectasis. Stable cardiomegaly. Electronically Signed   By: Mitzi HansenLance  Furusawa-Stratton M.D.   On: 12/06/2017 22:03   Ct Angio Chest Pe W Or Wo Contrast  Result Date: 12/07/2017 CLINICAL DATA:  76 year old male with shortness of breath. Concern for pulmonary embolism. EXAM: CT ANGIOGRAPHY CHEST WITH CONTRAST TECHNIQUE: Multidetector CT imaging of the chest was performed using the standard protocol during bolus administration of intravenous contrast. Multiplanar CT image reconstructions and MIPs were obtained to evaluate the vascular anatomy. CONTRAST:  75mL ISOVUE-370 IOPAMIDOL (ISOVUE-370) INJECTION 76% COMPARISON:  Chest radiograph dated 12/06/2017 and chest CT dated 11/01/2015. FINDINGS: Cardiovascular: There is moderate cardiomegaly. There is dilatation of the right cardiac chamber with retrograde flow of contrast from the right atrium into the IVC consistent with right cardiac dysfunction. Correlation with clinical exam and echocardiogram recommended. No pericardial effusion. Multi vessel coronary vascular calcification. There is mild atherosclerotic calcification of the thoracic aorta. The aorta is grossly unremarkable for the degree of enhancement. There is dilatation of the main pulmonary trunk suggestive of underlying pulmonary hypertension. Evaluation of the pulmonary artery is is somewhat limited due to respiratory motion artifact. Sequela of old pulmonary emboli with peripheral thrombus/scarring of the right lower lobe pulmonary artery branches as well as an area of scarring in the left upper lobe pulmonary  artery as seen on the prior CT of 2016. There is apparent increased thickening/scarring of the walls of the right lower lobe pulmonary artery branches compared to the prior CT. No definite acute embolus identified. Mediastinum/Nodes: Right hilar density extending along the right lower lobe pulmonary artery may be along the arterial wall although lymphadenopathy or a mass is not entirely excluded. Lungs/Pleura: There is severe emphysema. Large subpleural blebs noted. There is a moderate right and small left pleural effusions, increased in size compared to the prior CT. There is associated compressive atelectasis of the lower lobes. Pneumonia is not excluded. There is no pneumothorax. The central airways are patent. Small amount of mucus secretion noted in the trachea. Upper Abdomen: Mild irregularity of the liver contour likely related to passive congestion. Musculoskeletal: Diffuse subcutaneous edema and anasarca. There is osteopenia with degenerative changes of the spine. No acute osseous pathology. Review of the MIP images confirms the above findings. IMPRESSION: 1. No definite CT evidence of acute PE. Sequela of prior PE with thickening and scarring along the walls of the pulmonary artery branches. 2. Severe emphysema. Moderate right and small left pleural effusions, increased compared to prior radiograph. Bibasilar atelectasis versus infiltrate. 3. Moderate cardiomegaly with findings of right heart dysfunction. 4. Coronary vascular calcification. 5. Aortic Atherosclerosis (ICD10-I70.0) and Emphysema (ICD10-J43.9). Electronically Signed   By: Elgie CollardArash  Radparvar M.D.   On: 12/07/2017 01:53   [place] Venous Img Lower Unilateral Right  Result Date: 12/06/2017 CLINICAL DATA:  Right leg pain and edema for 2 weeks EXAM: Right LOWER EXTREMITY VENOUS DOPPLER ULTRASOUND TECHNIQUE: Gray-scale sonography with graded compression, as well as color Doppler and duplex ultrasound were performed to evaluate the lower extremity deep  venous systems from the level of the common femoral vein and including the common femoral, femoral, profunda femoral, popliteal and calf veins including the posterior tibial, peroneal and gastrocnemius veins when visible. The superficial great saphenous vein was also interrogated. Spectral Doppler  was utilized to evaluate flow at rest and with distal augmentation maneuvers in the common femoral, femoral and popliteal veins. COMPARISON:  04/11/2015 FINDINGS: Contralateral Common Femoral Vein: Respiratory phasicity is normal and symmetric with the symptomatic side. No evidence of thrombus. Normal compressibility. Common Femoral Vein: No evidence of thrombus. Normal compressibility, respiratory phasicity and response to augmentation. Saphenofemoral Junction: No evidence of thrombus. Normal compressibility and flow on color Doppler imaging. Profunda Femoral Vein: No evidence of thrombus. Normal compressibility and flow on color Doppler imaging. Femoral Vein: No evidence of thrombus. Normal compressibility, respiratory phasicity and response to augmentation. Popliteal Vein: Echogenic filling defects within the right popliteal vein, similar in appearance to the 2016 venous ultrasound. Calf Veins: Poorly visualized due to edema. Superficial Great Saphenous Vein: No evidence of thrombus. Normal compressibility. Venous Reflux:  None. Other Findings:  None. 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 No definite acute DVT is seen. Poorly visible calf vessels due to edema. Electronically Signed   By: Jasmine Pang M.D.   On: 12/06/2017 23:55    ASSESSMENT AND PLAN:   Principal Problem:   Acute on chronic diastolic CHF (congestive heart failure) (HCC) Active Problems:   GERD (gastroesophageal reflux disease)   COPD (chronic obstructive pulmonary disease) (HCC)    Essential hypertension   Diabetes mellitus (HCC)   Chronic atrial fibrillation (HCC)  * Altered mental status   Metabolic encephalopathy due to hypoxia   Stat ABG confirmed hypoxia, need HFNC now.   Stat Xray and Pulm consult done.  * Acute on chronic diastolic CHF (congestive heart failure) (HCC) -BNP was significantly elevated in the ED.  Troponin was also 0.2.     Cardiology consult appreciated- as troponin stable, no need for further work ups.    negative CT angiogram for PE.  cont IV lasix, watchful for renal func.    Due to worsening respi status- giving higher dose lasix today.  *  COPD (chronic obstructive pulmonary disease) exacerbation (HCC) -   IV steroids, nebs, inhaled steroids, Abx. * Possible pneumonia , pleural effusion   IV rocephin + azithromycin. * Essential hypertension -continue home meds *  Diabetes mellitus (HCC) -sliding scale insulin with corresponding glucose checks.  Patient is on metformin, but looking at his history of chronic kidney disease - stopped. *  Chronic atrial fibrillation (HCC) -continue home rate controlling medications *  GERD (gastroesophageal reflux disease) -home dose PPI   All the records are reviewed and case discussed with Care Management/Social Workerr. Management plans discussed with the patient, family and they are in agreement.  CODE STATUS: Full.  TOTAL TIME TAKING CARE OF THIS PATIENT: 45 critical care minutes.   worsened condition and hypoxia, required HFNC, spoke to Intensivist.  POSSIBLE D/C IN 1-2 DAYS, DEPENDING ON CLINICAL CONDITION.   Altamese Dilling M.D on 12/08/2017   Between 7am to 6pm - Pager - [phone]  After 6pm go to [email or web address] - Social research officer, government  [facility]  Office  [phone]  CC: Primary care physician; [facility]  Note: This dictation was prepared with Dragon dictation along with smaller phrase technology. Any transcriptional errors  that result from this process are unintentional.

## 2017-12-08 NOTE — Progress Notes (Signed)
SUBJECTIVE: Patient still very short of breath   Vitals:   12/07/17 1941 12/08/17 0500 12/08/17 0516 12/08/17 0909  BP: (!) 102/57  100/62 126/87  Pulse: 63  (!) 126 68  Resp: 20  (!) 21 18  Temp: 98.7 F (37.1 C)  98.1 F (36.7 C) 97.6 F (36.4 C)  TempSrc:   Oral Oral  SpO2: 96%  92% 97%  Weight:  196 lb 6.4 oz (89.1 kg)    Height:        Intake/Output Summary (Last 24 hours) at 12/08/2017 0916 Last data filed at 12/08/2017 16100712 Gross per 24 hour  Intake 600 ml  Output 625 ml  Net -25 ml    LABS: Basic Metabolic Panel: Recent Labs    12/07/17 0724 12/08/17 0602  NA 140 136  K 3.9 4.1  CL 106 101  CO2 28 28  GLUCOSE 85 122*  BUN 22* 31*  CREATININE 1.36* 1.68*  CALCIUM 9.7 9.2   Liver Function Tests: No results for input(s): AST, ALT, ALKPHOS, BILITOT, PROT, ALBUMIN in the last 72 hours. No results for input(s): LIPASE, AMYLASE in the last 72 hours. CBC: Recent Labs    12/06/17 2145 12/07/17 0724  WBC 7.5 5.7  HGB 14.2 13.2  HCT 43.5 41.1  MCV 92.1 92.2  PLT 191 183   Cardiac Enzymes: Recent Labs    12/07/17 0140 12/07/17 0724 12/07/17 1344  TROPONINI 0.20* 0.20* 0.19*   BNP: Invalid input(s): POCBNP D-Dimer: No results for input(s): DDIMER in the last 72 hours. Hemoglobin A1C: No results for input(s): HGBA1C in the last 72 hours. Fasting Lipid Panel: No results for input(s): CHOL, HDL, LDLCALC, TRIG, CHOLHDL, LDLDIRECT in the last 72 hours. Thyroid Function Tests: No results for input(s): TSH, T4TOTAL, T3FREE, THYROIDAB in the last 72 hours.  Invalid input(s): FREET3 Anemia Panel: No results for input(s): VITAMINB12, FOLATE, FERRITIN, TIBC, IRON, RETICCTPCT in the last 72 hours.   PHYSICAL EXAM General: Well developed, well nourished, in no acute distress HEENT:  Normocephalic and atramatic Neck:  No JVD.  Lungs: Clear bilaterally to auscultation and percussion. Heart: HRRR . Normal S1 and S2 without gallops or murmurs.  Abdomen:  Bowel sounds are positive, abdomen soft and non-tender  Msk:  Back normal, normal gait. Normal strength and tone for age. Extremities: No clubbing, cyanosis or edema.   Neuro: Alert and oriented X 3. Psych:  Good affect, responds appropriately  TELEMETRY: Appears to be in atrial fibrillation with slow ventricular rate  ASSESSMENT AND PLAN: Congestive heart failure with history of diastolic dysfunction and [name] repeat echocardiogram pending. Agree with 20 mg of Lasix IV every 8 hours since creatinine is 1.68 now cannot give higher dose of Lasix. Will make further recommendation after  echocardiogram.  Principal Problem:   Acute on chronic diastolic CHF (congestive heart failure) (HCC) Active Problems:   GERD (gastroesophageal reflux disease)   COPD (chronic obstructive pulmonary disease) (HCC)   Essential hypertension   Diabetes mellitus (HCC)   Chronic atrial fibrillation (HCC)    Austin [name],Austin Amir A, MD, [facility] 12/08/2017 9:16 AM

## 2017-12-08 NOTE — Progress Notes (Signed)
Family Meeting Note  Advance Directive:yes  Today a meeting took place with the spouse.  Patient is unable to participate due ZO:XWRUEAto:Lacked capacity lethargy   The following clinical team members were present during this meeting:MD  The following were discussed:Patient's diagnosis: respi failure, Ac diastolic CHF, COPD exacerbation , Patient's progosis: Unable to determine and Goals for treatment: Full Code  Additional follow-up to be provided: Pulmonary  Time spent during discussion:20 minutes  Altamese DillingVaibhavkumar Aaniya Sterba, MD

## 2017-12-08 NOTE — Progress Notes (Signed)
Pt had complained that HFNC made him feel worse and he wanted to go back on [place], RN placed back on [place] at 6l, decreased after neb tx to 5l due to sats of 97%.

## 2017-12-09 ENCOUNTER — Inpatient Hospital Stay
Admit: 2017-12-09 | Discharge: 2017-12-09 | Disposition: A | Discharge status: Home / Self Care | Payer: Medicare Other | Attending: Cardiovascular Disease | Admitting: Cardiovascular Disease

## 2017-12-09 DIAGNOSIS — I5033 Acute on chronic diastolic (congestive) heart failure: Secondary | ICD-10-CM

## 2017-12-09 DIAGNOSIS — J449 Chronic obstructive pulmonary disease, unspecified: Secondary | ICD-10-CM

## 2017-12-09 LAB — CBC
HEMATOCRIT: 43.1 % (ref 40.0–52.0)
HEMOGLOBIN: 13.9 g/dL (ref 13.0–18.0)
MCH: 30.1 pg (ref 26.0–34.0)
MCHC: 32.3 g/dL (ref 32.0–36.0)
MCV: 93 fL (ref 80.0–100.0)
Platelets: 172 10*3/uL (ref 150–440)
RBC: 4.63 MIL/uL (ref 4.40–5.90)
RDW: 15.9 % — ABNORMAL HIGH (ref 11.5–14.5)
WBC: 6.8 10*3/uL (ref 3.8–10.6)

## 2017-12-09 LAB — GLUCOSE, CAPILLARY
GLUCOSE-CAPILLARY: 360 mg/dL — AB (ref 65–99)
GLUCOSE-CAPILLARY: 231 mg/dL — AB (ref 65–99)
Glucose-Capillary: 263 mg/dL — ABNORMAL HIGH (ref 65–99)
Glucose-Capillary: 274 mg/dL — ABNORMAL HIGH (ref 65–99)

## 2017-12-09 LAB — ECHOCARDIOGRAM COMPLETE
Height: 73 in
Weight: 3116.8 oz

## 2017-12-09 LAB — BASIC METABOLIC PANEL
ANION GAP: 9 (ref 5–15)
BUN: 36 mg/dL — ABNORMAL HIGH (ref 6–20)
CHLORIDE: 98 mmol/L — AB (ref 101–111)
CO2: 27 mmol/L (ref 22–32)
Calcium: 9.4 mg/dL (ref 8.9–10.3)
Creatinine, Ser: 1.74 mg/dL — ABNORMAL HIGH (ref 0.61–1.24)
GFR calc non Af Amer: 37 mL/min — ABNORMAL LOW (ref >60)
GFR, EST AFRICAN AMERICAN: 42 mL/min — AB (ref >60)
Glucose, Bld: 300 mg/dL — ABNORMAL HIGH (ref 65–99)
POTASSIUM: 4.6 mmol/L (ref 3.5–5.1)
Sodium: 134 mmol/L — ABNORMAL LOW (ref 135–145)

## 2017-12-09 MED ORDER — IPRATROPIUM-ALBUTEROL 0.5-2.5 (3) MG/3ML IN SOLN
3.0000 mL | Freq: Four times a day (QID) | RESPIRATORY_TRACT | Status: DC
  Administered 2017-12-09 – 2017-12-13 (×16): 3 mL via RESPIRATORY_TRACT
  Filled 2017-12-09 (×16): qty 3

## 2017-12-09 MED ORDER — AZITHROMYCIN 250 MG PO TABS
250.0000 mg | ORAL_TABLET | Freq: Every day | ORAL | Status: DC
  Administered 2017-12-10 – 2017-12-13 (×4): 250 mg via ORAL
  Filled 2017-12-09 (×4): qty 1

## 2017-12-09 NOTE — Progress Notes (Signed)
   CHIEF COMPLAINT:   Chief Complaint  Patient presents with  . Shortness of Breath    Subjective  SOB improved per patient I started IV steroids and BD yesterday Patient NOT labored breathing Denies CP     Objective   Examination:  General exam: Appears calm and comfortable  Respiratory system: Clear to auscultation. Respiratory effort normal. HEENT: [place]/AT, PERRLA, no thrush, no stridor. Cardiovascular system: S1 & S2 heard, RRR. No JVD, murmurs, rubs, gallops or clicks. No pedal edema. Gastrointestinal system: Abdomen is nondistended, soft and nontender. No organomegaly or masses felt. Normal bowel sounds heard. Central nervous system: Alert and oriented. No focal neurological deficits. Extremities: Symmetric 5 x 5 power. +edema Skin: No rashes, lesions or ulcers Psychiatry: Judgement and insight appear normal. Mood & affect appropriate.   VITALS:  height is 6\' 1"  (1.854 m) and weight is 194 lb 12.8 oz (88.4 kg). His oral temperature is 97.6 F (36.4 C). His blood pressure is 103/69 and his pulse is 98. His respiration is 18 and oxygen saturation is 95%.   I personally reviewed Labs under Results section.  Radiology Reports Dg Chest 1 View  Result Date: 12/08/2017 CLINICAL DATA:  Interstitial markings are diffusely coarsened with chronic features. EXAM: CHEST 1 VIEW COMPARISON:  12/06/2017. FINDINGS: 1208 hours. Lordotic positioning. Vascular congestion without overt pulmonary edema. There is collapse/consolidation at the right base with small bilateral pleural effusion. Bones are diffusely demineralized. Telemetry leads overlie the chest. IMPRESSION: Cardiomegaly with right base collapse/ consolidation and small bilateral pleural effusions. Electronically Signed   By: Kennith CenterEric  Mansell M.D.   On: 12/08/2017 12:49   I have Independently reviewed images of  CXR   on 12/09/2017 Interpretation: b/l opacities     Assessment/Plan:  76 yo AAM with acute COPD and CHF  exacerbation possible pneumonia with progressive cardio-renal syndrome SOB seems to have improved last 12 hrs  1.continue oxygen as needed 2.BD therapy 3.continue IV steroids, IV abx 4.Lasix as tolerated  Code Status: FULL    DVT Prophylaxis   Heparin  Recommend palliative care consult for GOF discussion   LOS: 2 days   Austin [name]

## 2017-12-09 NOTE — Plan of Care (Signed)
  Progressing Education: Knowledge of General Education information will improve 12/09/2017 1613 - Progressing by Rigoberto NoelMorales, Kourtlynn Trevor Y, RN Clinical Measurements: Ability to maintain clinical measurements within normal limits will improve 12/09/2017 1613 - Progressing by Rigoberto NoelMorales, Keyly Baldonado Y, RN Skin Integrity: Risk for impaired skin integrity will decrease 12/09/2017 1613 - Progressing by Rigoberto NoelMorales, Loneta Tamplin Y, RN

## 2017-12-09 NOTE — Progress Notes (Signed)
Patient's b/p is 103/69, pulse 76. Per Dr. Welton FlakesKhan hold Diltiazem. Will continue to monitor.

## 2017-12-09 NOTE — [place] FL2 (Signed)
[place] MEDICAID FL2 LEVEL OF CARE SCREENING TOOL     IDENTIFICATION  Patient Name: Austin [name] Birthdate: 07-Nov-1941 Sex: male Admission Date (Current Location): 12/06/2017  [place]   Facility and Address:  [facility], [address], [place], [place] [address]      Provider Number: [id number]  Attending Physician Name and Address:  Altamese DillingVachhani, Vaibhavkumar, *  Relative Name and Phone Number:  Merry ProudChristine Wence (Spouse) [phone]    Current Level of Care: Hospital Recommended Level of Care: Skilled Nursing Facility Prior Approval Number:    Date Approved/Denied:   PASRR Number: [id number]  Discharge Plan: SNF    Current Diagnoses: Patient Active Problem List   Diagnosis Date Noted  . Atrial fibrillation (HCC) 11/08/2017  . Atrial fibrillation and flutter (HCC) 11/06/2017  . 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 Acute on chronic diastolic CHF (congestive heart failure) (HCC)   . Chronic atrial fibrillation (HCC)   . Acute lower UTI 07/16/2017  . Sepsis (HCC)   . Acute respiratory failure with hypoxia (HCC) 07/15/2017  . Moderate to severe pulmonary hypertension (HCC)--per 2-D echo 07/14/2017 07/15/2017  . Mass of lung 07/15/2017  . Ascending aortic aneurysm (HCC) 07/14/2017  . Elevated bilirubin 07/14/2017  . Left nephrolithiasis 07/14/2017  . Lactic acidosis 07/14/2017  . Sepsis due to undetermined organism with acute respiratory failure (HCC) 07/13/2017  . COPD exacerbation (HCC) 12/26/2016  . Hypoxia 11/03/2015  . COPD (chronic obstructive pulmonary disease) (HCC) 11/03/2015  . DVT of popliteal vein (HCC) 11/03/2015  . Chronic systolic CHF (congestive heart failure) (HCC) 11/03/2015  . Essential hypertension 11/03/2015  . Diabetes mellitus (HCC) 11/03/2015  . Continuous tobacco abuse 11/03/2015  . Chronic pulmonary embolism (HCC) 11/03/2015  . S/P  insertion of IVC (inferior vena caval) filter 11/03/2015  . Hemoptysis 10/31/2015  . Elevated troponin 09/16/2015  . SOB (shortness of breath) 08/24/2015  . Pulmonary embolism (HCC) 08/24/2015  . 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 Type 2 diabetes mellitus (HCC) 08/16/2015  . HLD (hyperlipidemia) 08/16/2015  . GERD (gastroesophageal reflux disease) 08/16/2015  . Chest pain 08/16/2015  . Constipation 08/16/2015  . HTN (hypertension) 07/21/2015  . COPD (chronic obstructive pulmonary disease) with chronic bronchitis (HCC) 07/21/2015  . CHF (NYHA class IV, ACC/AHA stage D) (HCC) 07/04/2015  . CHF (congestive heart failure) (HCC) 07/04/2015    Orientation RESPIRATION BLADDER Height & Weight     Self, Time  O2(8L o2) Continent Weight: 194 lb 12.8 oz (88.4 kg) Height:  6\' 1"  (185.4 cm)  BEHAVIORAL SYMPTOMS/MOOD NEUROLOGICAL BOWEL NUTRITION STATUS      Continent Diet(Heart healthy/Carb modified)  AMBULATORY STATUS COMMUNICATION OF NEEDS Skin   Extensive Assist   Normal                       Personal Care Assistance Level of Assistance  Bathing, Feeding, Dressing Bathing Assistance: Limited assistance Feeding assistance: Independent Dressing Assistance: Limited assistance     Functional Limitations Info             SPECIAL CARE FACTORS FREQUENCY  PT (By licensed PT)     PT Frequency: 5/week              Contractures Contractures Info: Not present    Additional Factors Info  Insulin Sliding Scale, Code Status, Allergies Code Status Info: Full Allergies Info: Contrast Media Iodinated Diagnostic Agents, Other  Insulin Sliding Scale Info: Novolog: 0-5 units QHS and 0-9  units TID with meals       Current Medications (12/09/2017):  This is the current hospital active medication list Current Facility-Administered Medications  Medication Dose Route Frequency Provider Last Rate Last Dose  . acetaminophen (TYLENOL)  tablet 650 mg  650 mg Oral Q6H PRN Oralia ManisWillis, David, MD   650 mg at 12/07/17 [id number]   Or  . acetaminophen (TYLENOL) suppository 650 mg  650 mg Rectal Q6H PRN Oralia ManisWillis, David, MD      . albuterol (PROVENTIL) (2.5 MG/3ML) 0.083% nebulizer solution 3 mL  3 mL Inhalation Q6H PRN Oralia ManisWillis, David, MD   3 mL at 12/09/17 [phone]  . aspirin EC tablet 81 mg  81 mg Oral Daily Oralia ManisWillis, David, MD   81 mg at 12/09/17 [id number]  . [START ON 12/10/2017] azithromycin (ZITHROMAX) tablet 250 mg  250 mg Oral Daily Altamese DillingVachhani, Vaibhavkumar, MD      . budesonide (PULMICORT) nebulizer solution 0.5 mg  0.5 mg Nebulization BID Erin FullingKasa, Kurian, MD   0.5 mg at 12/09/17 0731  . cefTRIAXone (ROCEPHIN) 1 g in dextrose 5 % 50 mL IVPB  1 g Intravenous Q24H Altamese DillingVachhani, Vaibhavkumar, MD   Stopped at 12/09/17 1254  . digoxin (LANOXIN) tablet 0.125 mg  0.125 mg Oral Daily Oralia ManisWillis, David, MD   0.125 mg at 12/09/17 0831  . diltiazem (CARDIZEM CD) 24 hr capsule 240 mg  240 mg Oral Daily Oralia ManisWillis, David, MD   240 mg at 12/08/17 0914  . heparin injection 5,000 Units  5,000 Units Subcutaneous [place] OraQ8H Willis, David, MD   5,000 Units at 12/09/17 1400  . insulin aspart (novoLOG) injection 0-5 Units  0-5 Units Subcutaneous QHS Oralia ManisWillis, David, MD   3 Units at 12/08/17 2158  . insulin aspart (novoLOG) injection 0-9 Units  0-9 Units Subcutaneous TID WC Oralia ManisWillis, David, MD   9 Units at 12/09/17 1224  . ipratropium-albuterol (DUONEB) 0.5-2.5 (3) MG/3ML nebulizer solution 3 mL  3 mL Nebulization Q6H Altamese DillingVachhani, Vaibhavkumar, MD   3 mL at 12/09/17 1412  . levalbuterol (XOPENEX) nebulizer solution 0.63 mg  0.63 mg Nebulization Q4H PRN Oralia ManisWillis, David, MD      . methylPREDNISolone sodium succinate (SOLU-MEDROL) 40 mg/mL injection 40 mg  40 mg Intravenous Q12H Erin FullingKasa, Kurian, MD   40 mg at 12/09/17 1400  . mometasone-formoterol (DULERA) 200-5 MCG/ACT inhaler 2 puff  2 puff Inhalation BID Oralia ManisWillis, David, MD   2 puff at 12/09/17 [phone]  . mupirocin cream (BACTROBAN) 2 %   Topical BID Alphonzo DublinSanders, Edvin Albus  A, RN      . nitroGLYCERIN (NITROSTAT) SL tablet 0.4 mg  0.4 mg Sublingual Q5 Min x 3 PRN Oralia ManisWillis, David, MD      . ondansetron [facility](ZOFRAN) tablet 4 mg  4 mg Oral Q6H PRN Oralia ManisWillis, David, MD       Or  . ondansetron [facility](ZOFRAN) injection 4 mg  4 mg Intravenous Q6H PRN Oralia ManisWillis, David, MD      . pantoprazole (PROTONIX) EC tablet 40 mg  40 mg Oral Daily Oralia ManisWillis, David, MD   40 mg at 12/09/17 [id number]  . pravastatin (PRAVACHOL) tablet 40 mg  40 mg Oral q1800 Oralia ManisWillis, David, MD   40 mg at 12/08/17 1719     Discharge Medications: Please see discharge summary for a list of discharge medications.  Relevant Imaging Results:  Relevant Lab Results:   Additional Information SS# [id number]  Judi CongKaren M Morene Cecilio, LCSW

## 2017-12-09 NOTE — Progress Notes (Signed)
[facility] [place] [facility]   PATIENT NAME: Austin [name]    MR#:  [id number]  DATE OF BIRTH:  03/14/1941  SUBJECTIVE:  CHIEF COMPLAINT:   Chief Complaint  Patient presents with  . Shortness of Breath     Came with leg edema and worsening SOB,  on 4 ltr oxygen, at home on 2 ltr oxygen.   On 12/08/17- was more lethargic, now improved again.  REVIEW OF SYSTEMS:  CONSTITUTIONAL: No fever, fatigue or weakness.  EYES: No blurred or double vision.  EARS, NOSE, AND THROAT: No tinnitus or ear pain.  RESPIRATORY: No cough,have shortness of breath, no wheezing or hemoptysis.  CARDIOVASCULAR: No chest pain, orthopnea, edema.  GASTROINTESTINAL: No nausea, vomiting, diarrhea or abdominal pain.  GENITOURINARY: No dysuria, hematuria.  ENDOCRINE: No polyuria, nocturia,  HEMATOLOGY: No anemia, easy bruising or bleeding SKIN: No rash or lesion. MUSCULOSKELETAL: No joint pain or arthritis.   NEUROLOGIC: No tingling, numbness, weakness.  PSYCHIATRY: No anxiety or depression.   ROS  DRUG ALLERGIES:   Allergies  Allergen Reactions  . Contrast Media [Iodinated Diagnostic Agents] Nausea And Vomiting  . Other Other (See Comments)    Cannot have "high doses of anesthesia because of his lungs"    VITALS:  Blood pressure 120/72, pulse 94, temperature 97.6 F (36.4 C), temperature source Oral, resp. rate 18, height 6\' 1"  (1.854 m), weight 88.4 kg (194 lb 12.8 oz), SpO2 93 %.  PHYSICAL EXAMINATION:  GENERAL:  76 y.o.-year-old patient lying in the bed with no acute distress.  EYES: Pupils equal, round, reactive to light and accommodation. No scleral icterus. Extraocular muscles intact.  HEENT: Head atraumatic, normocephalic. Oropharynx and nasopharynx clear.  NECK:  Supple, no jugular venous distention. No thyroid enlargement, no tenderness.  LUNGS: Normal breath sounds bilaterally, no wheezing, bilateral crepitation. some use of accessory muscles of respiration. Oxygen  via nasal canula. CARDIOVASCULAR: S1, S2 normal. No murmurs, rubs, or gallops.  ABDOMEN: Soft, nontender, nondistended. Bowel sounds present. No organomegaly or mass.  EXTREMITIES: some pedal edema, redness, no cyanosis, or clubbing.  NEUROLOGIC: Cranial nerves II through XII are intact. Muscle strength 4/5 in all extremities. Sensation intact. Gait not checked.  PSYCHIATRIC: The patient is alert  and oriented X 2 .  SKIN: No obvious rash, lesion, or ulcer.   Physical Exam LABORATORY PANEL:   CBC Recent Labs  Lab 12/09/17 0617  WBC 6.8  HGB 13.9  HCT 43.1  PLT 172   ------------------------------------------------------------------------------------------------------------------  Chemistries  Recent Labs  Lab 12/09/17 0617  NA 134*  K 4.6  CL 98*  CO2 27  GLUCOSE 300*  BUN 36*  CREATININE 1.74*  CALCIUM 9.4   ------------------------------------------------------------------------------------------------------------------  Cardiac Enzymes Recent Labs  Lab 12/07/17 0724 12/07/17 1344  TROPONINI 0.20* 0.19*   ------------------------------------------------------------------------------------------------------------------  RADIOLOGY:  Dg Chest 1 View  Result Date: 12/08/2017 CLINICAL DATA:  Interstitial markings are diffusely coarsened with chronic features. EXAM: CHEST 1 VIEW COMPARISON:  12/06/2017. FINDINGS: 1208 hours. Lordotic positioning. Vascular congestion without overt pulmonary edema. There is collapse/consolidation at the right base with small bilateral pleural effusion. Bones are diffusely demineralized. Telemetry leads overlie the chest. IMPRESSION: Cardiomegaly with right base collapse/ consolidation and small bilateral pleural effusions. Electronically Signed   By: Kennith CenterEric  Mansell M.D.   On: 12/08/2017 12:49    ASSESSMENT AND PLAN:   Principal Problem:   Acute on chronic diastolic CHF (congestive heart failure) (HCC) Active Problems:   GERD  (gastroesophageal reflux disease)  COPD (chronic obstructive pulmonary disease) (HCC)   Essential hypertension   Diabetes mellitus (HCC)   Chronic atrial fibrillation (HCC)  * Altered mental status- 12/08/17   Metabolic encephalopathy due to hypoxia   Stat ABG confirmed hypoxia, need HFNC now.   Stat Xray and Pulm consult done.   Appreciated help, much improved now and back to baseline.  * Acute on chronic diastolic CHF (congestive heart failure) (HCC) -BNP was significantly elevated in the ED.  Troponin was also 0.2.     Cardiology consult appreciated- as troponin stable, no need for further work ups.    negative CT angiogram for PE.  cont IV lasix, watchful for renal func.    Due to worsening respi status- giving higher dose lasix 12/08/17.   Renal function is even worse so I am holding Lasix today.  *  COPD (chronic obstructive pulmonary disease) exacerbation (HCC) -   IV steroids, nebs, inhaled steroids, Abx.   Show some improvement today. * Possible pneumonia , pleural effusion   IV rocephin + azithromycin. Will give total of 5 days of antibiotics. * Essential hypertension -continue home meds *  Diabetes mellitus (HCC) -sliding scale insulin with corresponding glucose checks.  Patient is on metformin, but looking at his history of chronic kidney disease - stopped. *  Chronic atrial fibrillation (HCC) -continue home rate controlling medications *  GERD (gastroesophageal reflux disease) -home dose PPI * CKD stage III- monitor with lasix.  All the records are reviewed and case discussed with Care Management/Social Workerr. Management plans discussed with the patient, family and they are in agreement.  CODE STATUS: Full.  TOTAL TIME TAKING CARE OF THIS PATIENT: 35 minutes.    POSSIBLE D/C IN 1-2 DAYS, DEPENDING ON CLINICAL CONDITION.   Altamese DillingVaibhavkumar Simaya Lumadue M.D on 12/09/2017   Between 7am to 6pm - Pager - [phone]  After 6pm go to [email or web address] - Air traffic controllerpassword EPAS  [facility]  [facility]  Office  [phone]  CC: Primary care physician; [facility]  Note: This dictation was prepared with Dragon dictation along with smaller phrase technology. Any transcriptional errors that result from this process are unintentional.

## 2017-12-09 NOTE — Progress Notes (Signed)
SUBJECTIVE: Patient is clinically feeling less short of breath   Vitals:   12/09/17 0343 12/09/17 0731 12/09/17 0757 12/09/17 1002  BP: 106/64  (!) 108/58 103/69  Pulse: 77  76 98  Resp: 18   18  Temp: 97.6 F (36.4 C)     TempSrc: Oral     SpO2: 93% 90% 92% 95%  Weight: 194 lb 12.8 oz (88.4 kg)     Height:        Intake/Output Summary (Last 24 hours) at 12/09/2017 1132 Last data filed at 12/09/2017 1038 Gross per 24 hour  Intake 1015 ml  Output 950 ml  Net 65 ml    LABS: Basic Metabolic Panel: Recent Labs    12/08/17 0602 12/09/17 0617  NA 136 134*  K 4.1 4.6  CL 101 98*  CO2 28 27  GLUCOSE 122* 300*  BUN 31* 36*  CREATININE 1.68* 1.74*  CALCIUM 9.2 9.4   Liver Function Tests: No results for input(s): AST, ALT, ALKPHOS, BILITOT, PROT, ALBUMIN in the last 72 hours. No results for input(s): LIPASE, AMYLASE in the last 72 hours. CBC: Recent Labs    12/07/17 0724 12/09/17 0617  WBC 5.7 6.8  HGB 13.2 13.9  HCT 41.1 43.1  MCV 92.2 93.0  PLT 183 172   Cardiac Enzymes: Recent Labs    12/07/17 0140 12/07/17 0724 12/07/17 1344  TROPONINI 0.20* 0.20* 0.19*   BNP: Invalid input(s): POCBNP D-Dimer: No results for input(s): DDIMER in the last 72 hours. Hemoglobin A1C: No results for input(s): HGBA1C in the last 72 hours. Fasting Lipid Panel: No results for input(s): CHOL, HDL, LDLCALC, TRIG, CHOLHDL, LDLDIRECT in the last 72 hours. Thyroid Function Tests: No results for input(s): TSH, T4TOTAL, T3FREE, THYROIDAB in the last 72 hours.  Invalid input(s): FREET3 Anemia Panel: No results for input(s): VITAMINB12, FOLATE, FERRITIN, TIBC, IRON, RETICCTPCT in the last 72 hours.   PHYSICAL EXAM General: Well developed, well nourished, in no acute distress HEENT:  Normocephalic and atramatic Neck:  No JVD.  Lungs: Clear bilaterally to auscultation and percussion. Heart: HRRR . Normal S1 and S2 without gallops or murmurs.  Abdomen: Bowel sounds are positive,  abdomen soft and non-tender  Msk:  Back normal, normal gait. Normal strength and tone for age. Extremities: No clubbing, cyanosis or edema.   Neuro: Alert and oriented X 3. Psych:  Good affect, responds appropriately  TELEMETRY: Sinus rhythm  ASSESSMENT AND PLAN: Congestive heart failure with possible pneumonia. Patient is improving to some extent with IV Lasix and waiting for echocardiogram to decide whether he needs any change in medication.  Principal Problem:   Acute on chronic diastolic CHF (congestive heart failure) (HCC) Active Problems:   GERD (gastroesophageal reflux disease)   COPD (chronic obstructive pulmonary disease) (HCC)   Essential hypertension   Diabetes mellitus (HCC)   Chronic atrial fibrillation (HCC)    Adrian BlackwaterKHAN,Keysi Oelkers A, MD, [facility] 12/09/2017 11:32 AM

## 2017-12-10 ENCOUNTER — Inpatient Hospital Stay: Payer: Medicare Other

## 2017-12-10 ENCOUNTER — Other Ambulatory Visit: Payer: Self-pay

## 2017-12-10 LAB — BASIC METABOLIC PANEL
ANION GAP: 8 (ref 5–15)
BUN: 44 mg/dL — ABNORMAL HIGH (ref 6–20)
CHLORIDE: 99 mmol/L — AB (ref 101–111)
CO2: 26 mmol/L (ref 22–32)
Calcium: 9.7 mg/dL (ref 8.9–10.3)
Creatinine, Ser: 1.51 mg/dL — ABNORMAL HIGH (ref 0.61–1.24)
GFR calc Af Amer: 50 mL/min — ABNORMAL LOW (ref >60)
GFR, EST NON AFRICAN AMERICAN: 43 mL/min — AB (ref >60)
GLUCOSE: 315 mg/dL — AB (ref 65–99)
POTASSIUM: 4.6 mmol/L (ref 3.5–5.1)
SODIUM: 133 mmol/L — AB (ref 135–145)

## 2017-12-10 LAB — GLUCOSE, CAPILLARY
GLUCOSE-CAPILLARY: 409 mg/dL — AB (ref 65–99)
GLUCOSE-CAPILLARY: 375 mg/dL — AB (ref 65–99)
GLUCOSE-CAPILLARY: 231 mg/dL — AB (ref 65–99)
Glucose-Capillary: 278 mg/dL — ABNORMAL HIGH (ref 65–99)
Glucose-Capillary: 187 mg/dL — ABNORMAL HIGH (ref 65–99)

## 2017-12-10 LAB — TROPONIN I
TROPONIN I: 0.17 ng/mL — AB (ref <0.03)
TROPONIN I: 0.18 ng/mL — AB (ref <0.03)
Troponin I: 0.18 ng/mL (ref <0.03)
Troponin I: 0.19 ng/mL (ref <0.03)

## 2017-12-10 MED ORDER — MORPHINE SULFATE (PF) 2 MG/ML IV SOLN
1.0000 mg | INTRAVENOUS | Status: AC
Start: 2017-12-10 — End: 2017-12-10
  Administered 2017-12-10: 1 mg via INTRAVENOUS

## 2017-12-10 MED ORDER — FUROSEMIDE 10 MG/ML IJ SOLN
40.0000 mg | Freq: Two times a day (BID) | INTRAMUSCULAR | Status: DC
  Administered 2017-12-10 – 2017-12-12 (×4): 40 mg via INTRAVENOUS
  Filled 2017-12-10 (×4): qty 4

## 2017-12-10 MED ORDER — METHYLPREDNISOLONE SODIUM SUCC 40 MG IJ SOLR: 40.0000 mg | Freq: Every day | INTRAMUSCULAR | Status: DC

## 2017-12-10 MED ORDER — MORPHINE SULFATE (PF) 2 MG/ML IV SOLN
INTRAVENOUS | Status: AC
  Administered 2017-12-10: 1 mg via INTRAVENOUS
  Filled 2017-12-10: qty 1

## 2017-12-10 MED ORDER — METHYLPREDNISOLONE SODIUM SUCC 40 MG IJ SOLR
40.0000 mg | Freq: Every day | INTRAMUSCULAR | Status: DC
  Administered 2017-12-10 – 2017-12-13 (×4): 40 mg via INTRAVENOUS
  Filled 2017-12-10 (×4): qty 1

## 2017-12-10 MED ORDER — INSULIN ASPART 100 UNIT/ML ~~LOC~~ SOLN
8.0000 [IU] | Freq: Once | SUBCUTANEOUS | Status: AC
  Administered 2017-12-10: 8 [IU] via SUBCUTANEOUS
  Filled 2017-12-10: qty 1

## 2017-12-10 MED ORDER — INSULIN GLARGINE 100 UNIT/ML ~~LOC~~ SOLN
10.0000 [IU] | Freq: Every day | SUBCUTANEOUS | Status: DC
  Administered 2017-12-10 – 2017-12-13 (×4): 10 [IU] via SUBCUTANEOUS
  Filled 2017-12-10 (×5): qty 0.1

## 2017-12-10 MED ORDER — INSULIN ASPART 100 UNIT/ML ~~LOC~~ SOLN
0.0000 [IU] | Freq: Three times a day (TID) | SUBCUTANEOUS | Status: DC
  Administered 2017-12-10: 5 [IU] via SUBCUTANEOUS
  Administered 2017-12-11: 11 [IU] via SUBCUTANEOUS
  Administered 2017-12-11: 3 [IU] via SUBCUTANEOUS
  Administered 2017-12-11: 5 [IU] via SUBCUTANEOUS
  Administered 2017-12-12: 11 [IU] via SUBCUTANEOUS
  Administered 2017-12-12: 8 [IU] via SUBCUTANEOUS
  Administered 2017-12-12: 3 [IU] via SUBCUTANEOUS
  Administered 2017-12-13: 8 [IU] via SUBCUTANEOUS
  Administered 2017-12-13: 3 [IU] via SUBCUTANEOUS
  Filled 2017-12-10 (×10): qty 1

## 2017-12-10 MED ORDER — NICOTINE 14 MG/24HR TD PT24
14.0000 mg | MEDICATED_PATCH | Freq: Every day | TRANSDERMAL | Status: DC
  Administered 2017-12-10 – 2017-12-13 (×4): 14 mg via TRANSDERMAL
  Filled 2017-12-10 (×4): qty 1

## 2017-12-10 NOTE — Progress Notes (Signed)
Inpatient Diabetes Program Recommendations  AACE/ADA: New Consensus Statement on Inpatient Glycemic Control (2015)  Target Ranges:  Prepandial:   less than 140 mg/dL      Peak postprandial:   less than 180 mg/dL (1-2 hours)      Critically ill patients:  140 - 180 mg/dL   Results for Austin [name], Austin [name] (MRN [id number]) as of 12/10/2017 10:15  Ref. Range 07/16/2017 03:43  Hemoglobin A1C Latest Ref Range: 4.8 - 5.6 % 7.9 (H)   Results for Austin [name], Austin [name] (MRN [id number]) as of 12/10/2017 10:15  Ref. Range 12/09/2017 07:55 12/09/2017 11:58 12/09/2017 16:58 12/09/2017 20:29  Glucose-Capillary Latest Ref Range: 65 - 99 mg/dL 244263 (H) 010360 (H) 272231 (H) 274 (H)   Results for Austin [name], Austin [name] (MRN [id number]) as of 12/10/2017 10:15  Ref. Range 12/10/2017 07:42  Glucose-Capillary Latest Ref Range: 65 - 99 mg/dL 742278 (H)    Admit with: SOB  History: DM, CHF  Home DM Meds: Metformin 500 mg BID       Glipizide 5 mg BID  Current Insulin Orders: Novolog Sensitive Correction Scale/ SSI (0-9 units) TID AC + HS       MD- Note patient getting Solumedrol 40 mg daily.  Dose reduced today (was 40 mg BID).  CBGs still quite elevated.  Home oral DM medications currently on hold.   MD- Please consider starting basal insulin in-hospital for patient while getting IV steroids:  Recommend Lantus 13 units daily (0.15 units/kg dosing)  Please also increase Novolog SSI to Moderate scale (0-15 units) TID AC + HS      --Will follow patient during hospitalization--  Ambrose FinlandJeannine Johnston Carla Rashad RN, MSN, CDE Diabetes Coordinator Inpatient Glycemic Control Team Team Pager: [phone] (8a-5p)

## 2017-12-10 NOTE — Care Management Important Message (Signed)
Important Message  Patient Details  Name: Austin [name] MRN: [id number] Date of Birth: 10/16/1941   Medicare Important Message Given:  Yes Signed IM notice given    Eber HongGreene, Rajohn Henery R, RN 12/10/2017, 8:23 AM

## 2017-12-10 NOTE — Progress Notes (Signed)
[facility] [place] [facility]   PATIENT NAME: Austin [name]    MR#:  [id number]  DATE OF BIRTH:  05/30/1941  SUBJECTIVE:  CHIEF COMPLAINT:   Chief Complaint  Patient presents with  . Shortness of Breath     Came with leg edema and worsening SOB,  on 4 ltr oxygen, at home on 2 ltr oxygen.   On 12/08/17- was more lethargic, now improved again.  Had some complaint of chest pain on 12/10/2017 morning, responded to morphine injection and nitroglycerin sublingual. For  REVIEW OF SYSTEMS:  CONSTITUTIONAL: No fever, fatigue or weakness.  EYES: No blurred or double vision.  EARS, NOSE, AND THROAT: No tinnitus or ear pain.  RESPIRATORY: No cough,have shortness of breath, no wheezing or hemoptysis.  CARDIOVASCULAR: No chest pain, orthopnea, edema.  GASTROINTESTINAL: No nausea, vomiting, diarrhea or abdominal pain.  GENITOURINARY: No dysuria, hematuria.  ENDOCRINE: No polyuria, nocturia,  HEMATOLOGY: No anemia, easy bruising or bleeding SKIN: No rash or lesion. MUSCULOSKELETAL: No joint pain or arthritis.   NEUROLOGIC: No tingling, numbness, weakness.  PSYCHIATRY: No anxiety or depression.   ROS  DRUG ALLERGIES:   Allergies  Allergen Reactions  . Contrast Media [Iodinated Diagnostic Agents] Nausea And Vomiting  . Other Other (See Comments)    Cannot have "high doses of anesthesia because of his lungs"    VITALS:  Blood pressure (!) 137/96, pulse (!) 107, temperature 98.7 F (37.1 C), resp. rate 16, height 6\' 1"  (1.854 m), weight 90.4 kg (199 lb 6.4 oz), SpO2 95 %.  PHYSICAL EXAMINATION:  GENERAL:  76 y.o.-year-old patient lying in the bed with no acute distress.  EYES: Pupils equal, round, reactive to light and accommodation. No scleral icterus. Extraocular muscles intact.  HEENT: Head atraumatic, normocephalic. Oropharynx and nasopharynx clear.  NECK:  Supple, no jugular venous distention. No thyroid enlargement, no tenderness.  LUNGS: Normal breath  sounds bilaterally, no wheezing, bilateral crepitation. some use of accessory muscles of respiration. Oxygen via nasal canula. CARDIOVASCULAR: S1, S2 normal. No murmurs, rubs, or gallops.  ABDOMEN: Soft, nontender, nondistended. Bowel sounds present. No organomegaly or mass.  EXTREMITIES: some pedal edema, redness, no cyanosis, or clubbing.  NEUROLOGIC: Cranial nerves II through XII are intact. Muscle strength 4/5 in all extremities. Sensation intact. Gait not checked.  PSYCHIATRIC: The patient is alert  and oriented X 2 .  SKIN: No obvious rash, lesion, or ulcer.   Physical Exam LABORATORY PANEL:   CBC Recent Labs  Lab 12/09/17 0617  WBC 6.8  HGB 13.9  HCT 43.1  PLT 172   ------------------------------------------------------------------------------------------------------------------  Chemistries  Recent Labs  Lab 12/10/17 0417  NA 133*  K 4.6  CL 99*  CO2 26  GLUCOSE 315*  BUN 44*  CREATININE 1.51*  CALCIUM 9.7   ------------------------------------------------------------------------------------------------------------------  Cardiac Enzymes Recent Labs  Lab 12/07/17 1344 12/10/17 1052  TROPONINI 0.19* 0.17*   ------------------------------------------------------------------------------------------------------------------  RADIOLOGY:  Dg Chest 1 View  Result Date: 12/10/2017 CLINICAL DATA:  Chest pain EXAM: CHEST 1 VIEW COMPARISON:  12/08/2017 FINDINGS: Cardiac shadow is enlarged. Right-sided pleural effusion is again identified as well as small left-sided pleural effusion. Right basilar infiltrate is again seen and stable. No new focal abnormality is noted. IMPRESSION: Stable changes in the right base with bilateral small effusions. Electronically Signed   By: Alcide CleverMark  Lukens M.D.   On: 12/10/2017 11:21    ASSESSMENT AND PLAN:   Principal Problem:   Acute on chronic diastolic CHF (congestive heart failure) (HCC)  Active Problems:   GERD (gastroesophageal  reflux disease)   COPD (chronic obstructive pulmonary disease) (HCC)   Essential hypertension   Diabetes mellitus (HCC)   Chronic atrial fibrillation (HCC)  * Altered mental status- 12/08/17   Metabolic encephalopathy due to hypoxia   Stat ABG confirmed hypoxia, need HFNC.   Stat Xray and Pulm consult done.   Appreciated help, much improved now and back to baseline.   Repeat X Ray on 12/10/2017 appears stable.  * Acute on chronic diastolic CHF (congestive heart failure) (HCC) -BNP was significantly elevated in the ED.  Troponin was also 0.2.     Cardiology consult appreciated- as troponin stable, no need for further work ups.    negative CT angiogram for PE.  cont IV lasix, watchful for renal func.    Due to worsening respi status- giving higher dose lasix 12/08/17.   Renal function is even worse so I am holding Lasix 12/09/17.   Resume Lasix now.  *  COPD (chronic obstructive pulmonary disease) exacerbation (HCC) -   IV steroids, nebs, inhaled steroids, Abx.   Show some improvement today.   Decreased dose of steroid as blood sugar is running high. And patient is improving.  * Possible pneumonia , pleural effusion   IV rocephin + azithromycin. Will give total of 5 days of antibiotics. * Essential hypertension -continue home meds *  Diabetes mellitus (HCC) -sliding scale insulin with corresponding glucose checks.  Patient is on metformin, but looking at his history of chronic kidney disease - stopped. *  Chronic atrial fibrillation (HCC) -continue home rate controlling medications *  GERD (gastroesophageal reflux disease) -home dose PPI * CKD stage III- monitor with lasix. * Hyperglycemia- this is secondary to IV steroid use.   Added Lantus and continue insulin sliding scale coverage.  All the records are reviewed and case discussed with Care Management/Social Workerr. Management plans discussed with the patient, family and they are in agreement.  CODE STATUS: Full.  TOTAL  TIME TAKING CARE OF THIS PATIENT: 35 minutes.    POSSIBLE D/C IN 1-2 DAYS, DEPENDING ON CLINICAL CONDITION.   Altamese DillingVaibhavkumar Riata Ikeda M.D on 12/10/2017   Between 7am to 6pm - Pager - [phone]  After 6pm go to [email or web address] - Social research officer, governmentpassword EPAS [facility]  [facility] [place] [facility]  Office  [phone]  CC: Primary care physician; [facility]  Note: This dictation was prepared with Dragon dictation along with smaller phrase technology. Any transcriptional errors that result from this process are unintentional.

## 2017-12-10 NOTE — Progress Notes (Signed)
SUBJECTIVE: Patient is still short of breath   Vitals:   12/09/17 1733 12/09/17 1925 12/10/17 0338 12/10/17 0818  BP:  107/72 117/80 125/83  Pulse: 87 80 97 85  Resp:  17 18   Temp:  97.8 F (36.6 C) 97.9 F (36.6 C) 98.7 F (37.1 C)  TempSrc:  Oral Oral   SpO2: 95% 96% 99% 95%  Weight:   199 lb 6.4 oz (90.4 kg)   Height:        Intake/Output Summary (Last 24 hours) at 12/10/2017 0918 Last data filed at 12/10/2017 0834 Gross per 24 hour  Intake 890 ml  Output 480 ml  Net 410 ml    LABS: Basic Metabolic Panel: Recent Labs    12/09/17 0617 12/10/17 0417  NA 134* 133*  K 4.6 4.6  CL 98* 99*  CO2 27 26  GLUCOSE 300* 315*  BUN 36* 44*  CREATININE 1.74* 1.51*  CALCIUM 9.4 9.7   Liver Function Tests: No results for input(s): AST, ALT, ALKPHOS, BILITOT, PROT, ALBUMIN in the last 72 hours. No results for input(s): LIPASE, AMYLASE in the last 72 hours. CBC: Recent Labs    12/09/17 0617  WBC 6.8  HGB 13.9  HCT 43.1  MCV 93.0  PLT 172   Cardiac Enzymes: Recent Labs    12/07/17 1344  TROPONINI 0.19*   BNP: Invalid input(s): POCBNP D-Dimer: No results for input(s): DDIMER in the last 72 hours. Hemoglobin A1C: No results for input(s): HGBA1C in the last 72 hours. Fasting Lipid Panel: No results for input(s): CHOL, HDL, LDLCALC, TRIG, CHOLHDL, LDLDIRECT in the last 72 hours. Thyroid Function Tests: No results for input(s): TSH, T4TOTAL, T3FREE, THYROIDAB in the last 72 hours.  Invalid input(s): FREET3 Anemia Panel: No results for input(s): VITAMINB12, FOLATE, FERRITIN, TIBC, IRON, RETICCTPCT in the last 72 hours.   PHYSICAL EXAM General: Well developed, well nourished, in no acute distress HEENT:  Normocephalic and atramatic Neck:  No JVD.  Lungs: Clear bilaterally to auscultation and percussion. Heart: HRRR . Normal S1 and S2 without gallops or murmurs.  Abdomen: Bowel sounds are positive, abdomen soft and non-tender  Msk:  Back normal, normal gait.  Normal strength and tone for age. Extremities: No clubbing, cyanosis or edema.   Neuro: Alert and oriented X 3. 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 Continue current medications with creatinine improving to some extent. May consider increasing Lasix to 40 mg IV twice a day.  Principal Problem:   Acute on chronic diastolic CHF (congestive heart failure) (HCC) Active Problems:   GERD (gastroesophageal reflux disease)   COPD (chronic obstructive pulmonary disease) (HCC)   Essential hypertension   Diabetes mellitus (HCC)   Chronic atrial fibrillation (HCC)    Adrian BlackwaterKHAN,Ginna Schuur A, MD, [facility] 12/10/2017 9:18 AM

## 2017-12-10 NOTE — Care Management (Signed)
Patient's renal status has shown decline with IV lasix.  Pulmonary has consulted. IV steroids and nebs. Physical therapy has recommended skilled nursing facility placement.  Wife is in agreement

## 2017-12-10 NOTE — Progress Notes (Addendum)
Pt c/o of chest pain 8/10 VS: BP 137/96, Pulse 107, O2, 95 % 4 L [place],  Pt also experiencing some tremor like activity intermittently of the legs, MD notified, ordered STAT EKG, 1 mg Morphine, and troponin labs.  1 mg morphine given and EKG performed.  1 SL nitro given  40 mg IV lasix,   CBG 409 with MD at bedside, he will place orders for insulin and lantus at this time. This RN will continue to monitor the patient.

## 2017-12-10 NOTE — Progress Notes (Signed)
PT Cancellation Note  Patient Details Name: Austin [name] MRN: [id number] DOB: 1940-12-14   Cancelled Treatment:    Reason Eval/Treat Not Completed: Medical issues which prohibited therapy. Treatment held today due to elevated troponin levels, BS levels, and declining sodium levels this date. Re attempt at a later date.    Scot DockHeidi E Barnes, PTA 12/10/2017, 2:27 PM

## 2017-12-10 NOTE — Progress Notes (Signed)
Patient weaned down from 5L to 4 L/[place] and he's sating 95 % at this moment.  No acute distress noted.The patient is resting quietly at this time with her eyes closed, respirations even and unlabored. Will continue to wean down as tolerated.

## 2017-12-11 LAB — GLUCOSE, CAPILLARY
GLUCOSE-CAPILLARY: 327 mg/dL — AB (ref 65–99)
GLUCOSE-CAPILLARY: 248 mg/dL — AB (ref 65–99)
GLUCOSE-CAPILLARY: 213 mg/dL — AB (ref 65–99)
Glucose-Capillary: 193 mg/dL — ABNORMAL HIGH (ref 65–99)

## 2017-12-11 LAB — BASIC METABOLIC PANEL
Anion gap: 9 (ref 5–15)
BUN: 47 mg/dL — ABNORMAL HIGH (ref 6–20)
CHLORIDE: 98 mmol/L — AB (ref 101–111)
CO2: 25 mmol/L (ref 22–32)
CREATININE: 1.4 mg/dL — AB (ref 0.61–1.24)
Calcium: 9.4 mg/dL (ref 8.9–10.3)
GFR, EST AFRICAN AMERICAN: 55 mL/min — AB (ref >60)
GFR, EST NON AFRICAN AMERICAN: 48 mL/min — AB (ref >60)
Glucose, Bld: 242 mg/dL — ABNORMAL HIGH (ref 65–99)
POTASSIUM: 4.9 mmol/L (ref 3.5–5.1)
SODIUM: 132 mmol/L — AB (ref 135–145)

## 2017-12-11 MED ORDER — SODIUM CHLORIDE 0.9% FLUSH
3.0000 mL | Freq: Two times a day (BID) | INTRAVENOUS | Status: DC
  Administered 2017-12-11 – 2017-12-13 (×4): 3 mL via INTRAVENOUS

## 2017-12-11 NOTE — Progress Notes (Signed)
SUBJECTIVE: Still short of breath on minimal exertion   Vitals:   12/11/17 0138 12/11/17 0402 12/11/17 0746 12/11/17 1118  BP:  115/84 117/70   Pulse:  95 84 89  Resp:  18 16   Temp:  98.6 F (37 C) 97.9 F (36.6 C)   TempSrc:  Oral    SpO2: 93% 96% 94% 95%  Weight:  192 lb 11.2 oz (87.4 kg)    Height:        Intake/Output Summary (Last 24 hours) at 12/11/2017 1149 Last data filed at 12/11/2017 1011 Gross per 24 hour  Intake 720 ml  Output 1400 ml  Net -680 ml    LABS: Basic Metabolic Panel: Recent Labs    12/10/17 0417 12/11/17 0451  NA 133* 132*  K 4.6 4.9  CL 99* 98*  CO2 26 25  GLUCOSE 315* 242*  BUN 44* 47*  CREATININE 1.51* 1.40*  CALCIUM 9.7 9.4   Liver Function Tests: No results for input(s): AST, ALT, ALKPHOS, BILITOT, PROT, ALBUMIN in the last 72 hours. No results for input(s): LIPASE, AMYLASE in the last 72 hours. CBC: Recent Labs    12/09/17 0617  WBC 6.8  HGB 13.9  HCT 43.1  MCV 93.0  PLT 172   Cardiac Enzymes: Recent Labs    12/10/17 1534 12/10/17 1924 12/10/17 2204  TROPONINI 0.18* 0.18* 0.19*   BNP: Invalid input(s): POCBNP D-Dimer: No results for input(s): DDIMER in the last 72 hours. Hemoglobin A1C: No results for input(s): HGBA1C in the last 72 hours. Fasting Lipid Panel: No results for input(s): CHOL, HDL, LDLCALC, TRIG, CHOLHDL, LDLDIRECT in the last 72 hours. Thyroid Function Tests: No results for input(s): TSH, T4TOTAL, T3FREE, THYROIDAB in the last 72 hours.  Invalid input(s): FREET3 Anemia Panel: No results for input(s): VITAMINB12, FOLATE, FERRITIN, TIBC, IRON, RETICCTPCT in the last 72 hours.   PHYSICAL EXAM General: Well developed, well nourished, in no acute distress HEENT:  Normocephalic and atramatic Neck:  No JVD.  Lungs: Clear bilaterally to auscultation and percussion. Heart: HRRR . Normal S1 and S2 without gallops or murmurs.  Abdomen: Bowel sounds are positive, abdomen soft and non-tender  Msk:  Back  normal, normal gait. Normal strength and tone for age. Extremities: No clubbing, cyanosis or edema.   Neuro: Alert and oriented X 3. 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 Appears to be gradually improving with current treatment and Lasix was stopped because of renal insufficiency and has been restarted at 40 mg IV every 12. Will have to monitor renal function.  Principal Problem:   Acute on chronic diastolic CHF (congestive heart failure) (HCC) Active Problems:   GERD (gastroesophageal reflux disease)   COPD (chronic obstructive pulmonary disease) (HCC)   Essential hypertension   Diabetes mellitus (HCC)   Chronic atrial fibrillation (HCC)    Adrian BlackwaterKHAN,Jaslynn Thome A, MD, [facility] 12/11/2017 11:49 AM

## 2017-12-11 NOTE — Progress Notes (Signed)
[employer] -  [facility]   PATIENT NAME: Austin MessingJames [name]    MR#:  [id number]  DATE OF BIRTH:  August 29, 1941  SUBJECTIVE:  CHIEF COMPLAINT:   Chief Complaint  Patient presents with  . Shortness of Breath     Came with leg edema and worsening SOB,  on 4 ltr oxygen, at home on 2 ltr oxygen.   On 12/08/17- was more lethargic, now improved again.  Had some complaint of chest pain on 12/10/2017 morning, responded to morphine injection and nitroglycerin sublingual.    Troponin stable, better now. Cont to taper oxygen.  REVIEW OF SYSTEMS:  CONSTITUTIONAL: No fever, fatigue or weakness.  EYES: No blurred or double vision.  EARS, NOSE, AND THROAT: No tinnitus or ear pain.  RESPIRATORY: No cough,have shortness of breath, no wheezing or hemoptysis.  CARDIOVASCULAR: No chest pain, orthopnea, edema.  GASTROINTESTINAL: No nausea, vomiting, diarrhea or abdominal pain.  GENITOURINARY: No dysuria, hematuria.  ENDOCRINE: No polyuria, nocturia,  HEMATOLOGY: No anemia, easy bruising or bleeding SKIN: No rash or lesion. MUSCULOSKELETAL: No joint pain or arthritis.   NEUROLOGIC: No tingling, numbness, weakness.  PSYCHIATRY: No anxiety or depression.   ROS  DRUG ALLERGIES:   Allergies  Allergen Reactions  . Contrast Media [Iodinated Diagnostic Agents] Nausea And Vomiting  . Other Other (See Comments)    Cannot have "high doses of anesthesia because of his lungs"    VITALS:  Blood pressure 122/73, pulse 76, temperature 97.9 F (36.6 C), resp. rate 18, height 6\' 1"  (1.854 m), weight 87.4 kg (192 lb 11.2 oz), SpO2 95 %.  PHYSICAL EXAMINATION:  GENERAL:  77 y.o.-year-old patient lying in the bed with no acute distress.  EYES: Pupils equal, round, reactive to light and accommodation. No scleral icterus. Extraocular muscles intact.  HEENT: Head atraumatic, normocephalic. Oropharynx and nasopharynx clear.  NECK:  Supple, no jugular venous distention. No thyroid enlargement,  no tenderness.  LUNGS: Normal breath sounds bilaterally, no wheezing, bilateral crepitation. some use of accessory muscles of respiration. Oxygen via nasal canula. CARDIOVASCULAR: S1, S2 normal. No murmurs, rubs, or gallops.  ABDOMEN: Soft, nontender, nondistended. Bowel sounds present. No organomegaly or mass.  EXTREMITIES: some pedal edema, redness, no cyanosis, or clubbing.  NEUROLOGIC: Cranial nerves II through XII are intact. Muscle strength 4/5 in all extremities. Sensation intact. Gait not checked.  PSYCHIATRIC: The patient is alert  and oriented X 2 .  SKIN: No obvious rash, lesion, or ulcer.   Physical Exam LABORATORY PANEL:   CBC Recent Labs  Lab 12/09/17 0617  WBC 6.8  HGB 13.9  HCT 43.1  PLT 172   ------------------------------------------------------------------------------------------------------------------  Chemistries  Recent Labs  Lab 12/11/17 0451  NA 132*  K 4.9  CL 98*  CO2 25  GLUCOSE 242*  BUN 47*  CREATININE 1.40*  CALCIUM 9.4   ------------------------------------------------------------------------------------------------------------------  Cardiac Enzymes Recent Labs  Lab 12/10/17 1924 12/10/17 2204  TROPONINI 0.18* 0.19*   ------------------------------------------------------------------------------------------------------------------  RADIOLOGY:  Dg Chest 1 View  Result Date: 12/10/2017 CLINICAL DATA:  Chest pain EXAM: CHEST 1 VIEW COMPARISON:  12/08/2017 FINDINGS: Cardiac shadow is enlarged. Right-sided pleural effusion is again identified as well as small left-sided pleural effusion. Right basilar infiltrate is again seen and stable. No new focal abnormality is noted. IMPRESSION: Stable changes in the right base with bilateral small effusions. Electronically Signed   By: Alcide CleverMark  Lukens M.D.   On: 12/10/2017 11:21    ASSESSMENT AND PLAN:   Principal Problem:   Acute  on chronic diastolic CHF (congestive heart failure) (HCC) Active  Problems:   GERD (gastroesophageal reflux disease)   COPD (chronic obstructive pulmonary disease) (HCC)   Essential hypertension   Diabetes mellitus (HCC)   Chronic atrial fibrillation (HCC)  * Altered mental status- 12/08/17   Metabolic encephalopathy due to hypoxia   Stat ABG confirmed hypoxia, need HFNC.   Stat Xray and Pulm consult done.   Appreciated help, much improved now and back to baseline.   Repeat X Ray on 12/10/2017 appears stable.  * Acute on chronic diastolic CHF (congestive heart failure) (HCC) -BNP was significantly elevated in the ED.  Troponin was also 0.2.     Cardiology consult appreciated- as troponin stable, no need for further work ups.    negative CT angiogram for PE.  cont IV lasix, watchful for renal func.    Due to worsening respi status- giving higher dose lasix 12/08/17.   Renal function is even worse so I am holding Lasix 12/09/17.   Resume Lasix 12/10/17.  *  COPD (chronic obstructive pulmonary disease) exacerbation (HCC) -   IV steroids, nebs, inhaled steroids, Abx.   Show some improvement today.   Decreased dose of steroid as blood sugar is running high. And patient is improving.  * Possible pneumonia , pleural effusion   IV rocephin + azithromycin. Will give total of 5 days of antibiotics. * Essential hypertension -continue home meds *  Diabetes mellitus (HCC) -sliding scale insulin with corresponding glucose checks.  Patient is on metformin, but looking at his history of chronic kidney disease - stopped. *  Chronic atrial fibrillation (HCC) -continue home rate controlling medications *  GERD (gastroesophageal reflux disease) -home dose PPI * CKD stage III- monitor with lasix. * Hyperglycemia- this is secondary to IV steroid use.   Added Lantus and continue insulin sliding scale coverage.  All the records are reviewed and case discussed with Care Management/Social Workerr. Management plans discussed with the patient, family and they are in  agreement.  CODE STATUS: Full.  TOTAL TIME TAKING CARE OF THIS PATIENT: 35 minutes.    POSSIBLE D/C IN 1-2 DAYS, DEPENDING ON CLINICAL CONDITION.   Altamese Dilling M.D on 12/11/2017   Between 7am to 6pm - Pager - [phone]  After 6pm go to [email or web address] - Social research officer, government  [facility] [place] [facility]  Office  [phone]  CC: Primary care physician; [facility]  Note: This dictation was prepared with Dragon dictation along with smaller phrase technology. Any transcriptional errors that result from this process are unintentional.

## 2017-12-11 NOTE — Clinical Social Work Note (Signed)
Clinical Social Work Assessment  Patient Details  Name: Austin [name] MRN: [id number] Date of Birth: 1941/09/08  Date of referral:  12/11/17               Reason for consult:  Facility Placement                Permission sought to share information with:  Family Supports Permission granted to share information::  Yes, Verbal Permission Granted  Name::     Austin [name],Austin [name] Spouse [phone] or Austin [name],Austin [phone]  Agency::  SNF admissions  Relationship::     Contact Information:     Housing/Transportation Living arrangements for the past 2 months:  Single Family Home Source of Information:  Spouse Patient Interpreter Needed:  None Criminal Activity/Legal Involvement Pertinent to Current Situation/Hospitalization:  No - Comment as needed Significant Relationships:  Spouse, Other Family Members Lives with:  Spouse, Relatives Do you feel safe going back to the place where you live?  No Need for family participation in patient care:  Yes (Comment)  Care giving concerns:  Patient and family feel he needs some short term rehab before he is able to return back home.   Social Worker assessment / plan:  Patient is a 77 year old male who is alert and oriented x2 and married.  Patient lives with his wife and granddaughter.  Patient has some confusion assessment completed by speaking with patient's wife.  Patient's wife reports that he has not been to SNF before for short term rehab.  CSW explained role and process for trying to find placement for SNF.  CSW explained to patient's wife, how insurance will pay for the stay, CSW also discussed what to expect at SNF and once he is getting ready for discharge from SNF.  CSW explained to patient's wife how he would get to SNF via EMS, and then it would be billed to insurance.  CSW also reminded patient's wife, that most SNFs do not allow smoking.  Patient and his wife expressed understanding and did not have any  other questions or concerns.  CSW was given permission [place].  Employment status:  Retired Database administratornsurance information:  Managed Medicare PT Recommendations:  Skilled Nursing Facility Information / Referral to community resources:  Skilled Nursing Facility  Patient/Family's Response to care: Patient and family agreeable to going to SNF for short term rehab.  Patient/Family's Understanding of and Emotional Response to Diagnosis, Current Treatment, and Prognosis:  Patient's wife is hopeful that he will not have to be in SNF for very long.  Emotional Assessment Appearance:  Appears stated age Attitude/Demeanor/Rapport:    Affect (typically observed):  Appropriate, Calm Orientation:  Oriented to Self, Oriented to Place Alcohol / Substance use:  Tobacco Use Psych involvement (Current and /or in the community):  No (Comment)  Discharge Needs  Concerns to be addressed:  Lack of Support Readmission within the last 30 days:  No Current discharge risk:  Lack of support system Barriers to Discharge:  Continued Medical Work up   [place] Const[employer], Martrice Apt R, LCSWA 12/11/2017, 6:30 PM

## 2017-12-11 NOTE — Plan of Care (Signed)
Patient is not very active in bed, encourage patient to get up for meals to recliner and while awake. Patient lies in an fetal position for optimal breathing. Encourage patient to take deep breaths while sitting at 45 degrees.

## 2017-12-11 NOTE — Plan of Care (Signed)
  Progressing Education: Knowledge of General Education information will improve 12/11/2017 1529 - Progressing by Rigoberto NoelMorales, Jerardo Costabile Y, RN Health Behavior/Discharge Planning: Ability to manage health-related needs will improve 12/11/2017 1529 - Progressing by Rigoberto NoelMorales, Laddie Naeem Y, RN Clinical Measurements: Ability to maintain clinical measurements within normal limits will improve 12/11/2017 1529 - Progressing by Rigoberto NoelMorales, Merilyn Pagan Y, RN Will remain free from infection 12/11/2017 1529 - Progressing by Rigoberto NoelMorales, Shayda Kalka Y, RN

## 2017-12-11 NOTE — Clinical Social Work Note (Addendum)
CSW presented bed offers to family and they Chose [facility] for short term rehab.  [facility] once insurance has been approved.  CSW also informed patient's wife that he can not smoke at facility per SNF regulations.  CSW to continue to follow patient's progress throughout discharge planning.  3:45pm  CSW received [facility], that they have received insurance authorization for patient to go to SNF on Wednesday if he is medically ready for discharge.  Ervin KnackEric R. Jalasia Eskridge, MSW, Theresia MajorsLCSWA [phone]  12/11/2017 1:59 PM

## 2017-12-11 NOTE — Progress Notes (Signed)
SOB seems to have improved with medical therapy  Will sign off at this time.  Please call with any questions

## 2017-12-12 LAB — BASIC METABOLIC PANEL
Anion gap: 7 (ref 5–15)
BUN: 50 mg/dL — AB (ref 6–20)
CHLORIDE: 102 mmol/L (ref 101–111)
CO2: 26 mmol/L (ref 22–32)
CREATININE: 1.52 mg/dL — AB (ref 0.61–1.24)
Calcium: 9.8 mg/dL (ref 8.9–10.3)
GFR calc Af Amer: 50 mL/min — ABNORMAL LOW (ref >60)
GFR calc non Af Amer: 43 mL/min — ABNORMAL LOW (ref >60)
Glucose, Bld: 244 mg/dL — ABNORMAL HIGH (ref 65–99)
POTASSIUM: 5 mmol/L (ref 3.5–5.1)
Sodium: 135 mmol/L (ref 135–145)

## 2017-12-12 LAB — GLUCOSE, CAPILLARY
GLUCOSE-CAPILLARY: 246 mg/dL — AB (ref 65–99)
Glucose-Capillary: 205 mg/dL — ABNORMAL HIGH (ref 65–99)
Glucose-Capillary: 304 mg/dL — ABNORMAL HIGH (ref 65–99)
Glucose-Capillary: 335 mg/dL — ABNORMAL HIGH (ref 65–99)

## 2017-12-12 MED ORDER — GUAIFENESIN-DM 100-10 MG/5ML PO SYRP
10.0000 mL | ORAL_SOLUTION | Freq: Four times a day (QID) | ORAL | Status: DC | PRN
  Administered 2017-12-12: 10 mL via ORAL
  Filled 2017-12-12: qty 10

## 2017-12-12 MED ORDER — MORPHINE SULFATE (PF) 2 MG/ML IV SOLN: 1.0000 mg | INTRAVENOUS | Status: DC | PRN

## 2017-12-12 MED ORDER — OXYCODONE HCL 5 MG PO TABS
5.0000 mg | ORAL_TABLET | ORAL | Status: DC | PRN
  Administered 2017-12-12: 5 mg via ORAL
  Filled 2017-12-12: qty 1

## 2017-12-12 MED ORDER — FUROSEMIDE 20 MG PO TABS
20.0000 mg | ORAL_TABLET | Freq: Every day | ORAL | Status: DC
  Administered 2017-12-13: 20 mg via ORAL
  Filled 2017-12-12: qty 1

## 2017-12-12 NOTE — Progress Notes (Signed)
Chaplain responded to an OR for patient who requested prayer. [place] met pt at bedside. Pt talked about his family, his church and illness. Pt was alert and coherent but did know how long he would be in hospital. [facility] offered pt prayer to cope with his illness as pt had requested. Before Emerson left the Rm, pt asked [place] to visit him  again soon, which [place] agreed to do.   12/12/17 1100  Clinical Encounter Type  Visited With Patient  Visit Type Initial;Spiritual support  Referral From Nurse  Consult/Referral To Chaplain  Spiritual Encounters  Spiritual Needs Literature;Brochure

## 2017-12-12 NOTE — Progress Notes (Signed)
[employer] [facility]   PATIENT NAME: Austin MessingJames [name]    MR#:  [id number]  DATE OF BIRTH:  1941/06/23  SUBJECTIVE:  CHIEF COMPLAINT:   Chief Complaint  Patient presents with  . Shortness of Breath     Came with leg edema and worsening SOB,  on 4.5 ltr oxygen, at home on 2 ltr oxygen.   On 12/08/17- was more lethargic, now improved again. Patient resting comfortably but still on 4 and half liters of oxygen via nasal cannula.  Denies any shortness of breath while resting  REVIEW OF SYSTEMS:  CONSTITUTIONAL: No fever, fatigue or weakness.  EYES: No blurred or double vision.  EARS, NOSE, AND THROAT: No tinnitus or ear pain.  RESPIRATORY: No cough,have shortness of breath, no wheezing or hemoptysis.  CARDIOVASCULAR: No chest pain, orthopnea, edema.  GASTROINTESTINAL: No nausea, vomiting, diarrhea or abdominal pain.  GENITOURINARY: No dysuria, hematuria.  ENDOCRINE: No polyuria, nocturia,  HEMATOLOGY: No anemia, easy bruising or bleeding SKIN: No rash or lesion. MUSCULOSKELETAL: No joint pain or arthritis.   NEUROLOGIC: No tingling, numbness, weakness.  PSYCHIATRY: No anxiety or depression.   ROS  DRUG ALLERGIES:   Allergies  Allergen Reactions  . Contrast Media [Iodinated Diagnostic Agents] Nausea And Vomiting  . Other Other (See Comments)    Cannot have "high doses of anesthesia because of his lungs"    VITALS:  Blood pressure (!) 135/91, pulse 77, temperature 97.7 F (36.5 C), temperature source Oral, resp. rate 16, height 6\' 1"  (1.854 m), weight 88.3 kg (194 lb 9.6 oz), SpO2 91 %.  PHYSICAL EXAMINATION:  GENERAL:  77 y.o.-year-old patient lying in the bed with no acute distress.  EYES: Pupils equal, round, reactive to light and accommodation. No scleral icterus. Extraocular muscles intact.  HEENT: Head atraumatic, normocephalic. Oropharynx and nasopharynx clear.  NECK:  Supple, no jugular venous distention. No thyroid enlargement, no  tenderness.  LUNGS: Normal breath sounds bilaterally, no wheezing, bilateral crepitation. some use of accessory muscles of respiration. Oxygen via nasal canula. CARDIOVASCULAR: S1, S2 normal. No murmurs, rubs, or gallops.  ABDOMEN: Soft, nontender, nondistended. Bowel sounds present. No organomegaly or mass.  EXTREMITIES: some pedal edema, redness, no cyanosis, or clubbing.  NEUROLOGIC: Cranial nerves II through XII are intact. Muscle strength 4/5 in all extremities. Sensation intact. Gait not checked.  PSYCHIATRIC: The patient is alert  and oriented X 2 .  SKIN: No obvious rash, lesion, or ulcer.   Physical Exam LABORATORY PANEL:   CBC Recent Labs  Lab 12/09/17 0617  WBC 6.8  HGB 13.9  HCT 43.1  PLT 172   ------------------------------------------------------------------------------------------------------------------  Chemistries  Recent Labs  Lab 12/12/17 0421  NA 135  K 5.0  CL 102  CO2 26  GLUCOSE 244*  BUN 50*  CREATININE 1.52*  CALCIUM 9.8   ------------------------------------------------------------------------------------------------------------------  Cardiac Enzymes Recent Labs  Lab 12/10/17 1924 12/10/17 2204  TROPONINI 0.18* 0.19*   ------------------------------------------------------------------------------------------------------------------  RADIOLOGY:  No results found.  ASSESSMENT AND PLAN:   Principal Problem:   Acute on chronic diastolic CHF (congestive heart failure) (HCC) Active Problems:   GERD (gastroesophageal reflux disease)   COPD (chronic obstructive pulmonary disease) (HCC)   Essential hypertension   Diabetes mellitus (HCC)   Chronic atrial fibrillation (HCC)  * Altered mental status- 12/08/17   Metabolic encephalopathy due to hypoxia clinically improved,   Stat ABG confirmed hypoxia, need HFNC.   Stat Xray and Pulm consult appreciated Clinically improved and at his baseline  Repeat X Ray on 12/10/2017 appears  stable.   * Acute on chronic diastolic CHF (congestive heart failure) (HCC) -BNP was significantly elevated in the ED.  Troponin was also 0.2.     Cardiology consult appreciated- as troponin stable, no need for further work ups.    negative CT angiogram for PE.    Due to worsening respi status- giving higher dose lasix 12/08/17. IV Lasix resumed.,  Closely monitor renal function Wean off oxygen as tolerated to baseline 2-3 L via nasal cannula  *  COPD (chronic obstructive pulmonary disease) exacerbation (HCC) -   IV steroids, nebs, inhaled steroids, Abx.     Decreased dose of steroid as blood sugar is running high. And patient is improving.  * Possible pneumonia , pleural effusion   IV rocephin + azithromycin. Will give total of 5 days of antibiotics.  * Essential hypertension -continue home meds  *  Diabetes mellitus (HCC) -sliding scale insulin with corresponding glucose checks.  Patient is on metformin, but looking at his history of chronic kidney disease - stopped.  *  Chronic atrial fibrillation (HCC) -continue home rate controlling medications  *  GERD (gastroesophageal reflux disease) -home dose PPI  * CKD stage III- monitor with lasix.  * Hyperglycemia- this is secondary to IV steroid use.   Added Lantus and continue insulin sliding scale coverage.  All the records are reviewed and case discussed with Care Management/Social Workerr. Management plans discussed with the patient, wife and they are in agreement.  CODE STATUS: Full.  TOTAL TIME TAKING CARE OF THIS PATIENT: 35 minutes.    POSSIBLE D/C IN 1-2 DAYS, DEPENDING ON CLINICAL CONDITION.   Ramonita Lab M.D on 12/12/2017   Between 7am to 6pm - Pager - [phone]   After 6pm go to [email or web address] - Social research officer, government  [facility]  Office  [phone]  CC: Primary care physician; [facility]  Note: This dictation was prepared with Dragon dictation along with  smaller phrase technology. Any transcriptional errors that result from this process are unintentional.

## 2017-12-12 NOTE — Progress Notes (Signed)
Pt is in visible pain when he coughs. His cough is productive with small amount of sputum. MD notified. Orders for cough med, pain med received. I will continue to assess.

## 2017-12-12 NOTE — Plan of Care (Signed)
Encourage patient to ambulate in room with staff to recliner.

## 2017-12-12 NOTE — Progress Notes (Addendum)
Inpatient Diabetes Program Recommendations  AACE/ADA: New Consensus Statement on Inpatient Glycemic Control (2015)  Target Ranges:  Prepandial:   less than 140 mg/dL      Peak postprandial:   less than 180 mg/dL (1-2 hours)      Critically ill patients:  140 - 180 mg/dL   Results for Austin [name], [name] (MRN [id number]) as of 12/12/2017 10:30  Ref. Range 12/11/2017 07:46 12/11/2017 11:53 12/11/2017 16:54 12/11/2017 20:20  Glucose-Capillary Latest Ref Range: 65 - 99 mg/dL 409193 (H) 811327 (H) 914248 (H) 213 (H)   Results for Austin [name], [name] (MRN [id number]) as of 12/12/2017 10:30  Ref. Range 12/12/2017 07:46  Glucose-Capillary Latest Ref Range: 65 - 99 mg/dL 086205 (H)   Admit with: SOB  History: DM, CHF  Home DM Meds: Metformin 500 mg BID                             Glipizide 5 mg BID  Current Insulin Orders: Novolog Moderate Correction Scale/ SSI (0-15 units) TID AC + HS    Lantus 10 units daily       Note patient getting Solumedrol 40 mg daily.    CBGs quite elevated.  Home oral DM medications currently on hold.    Lantus started on 12/31.   MD- Please consider the following in-hospital insulin adjustments while patient still receiving IV steroids:  1. Increase Lantus to 15 units daily  2. Start Novolog Meal Coverage: Novolog 4 units TID with meals (hold if pt eats <50% of meal)       --Will follow patient during hospitalization--  Ambrose FinlandJeannine Johnston Arasely Akkerman RN, MSN, CDE Diabetes Coordinator Inpatient Glycemic Control Team Team Pager: [phone] (8a-5p)

## 2017-12-12 NOTE — Care Management Important Message (Signed)
Important Message  Patient Details  Name: Austin [name] MRN: [id number] Date of Birth: 22-Jun-1941   Medicare Important Message Given:  Yes    Eber HongGreene, Easton Sivertson R, RN 12/12/2017, 2:37 PM

## 2017-12-12 NOTE — Progress Notes (Signed)
Dressing to bilateral lower extremity changed per order. Pt tolerated the procedure. I will continue to assess.

## 2017-12-12 NOTE — Progress Notes (Signed)
Physical Therapy Treatment Patient Details Name: Austin MccreedyJames Allen Malicoat MRN: [id number] DOB: Dec 23, 1940 Today's Date: 12/12/2017    History of Present Illness 77 yo male with onset of CHF and SOB, elevated troponin and AMS with COPD exacerbation.  PMHx:  HTN, a-fib, CHF, DM, COPD, GERD,     PT Comments    Pt agreeable to PT; pain reported in distal Right lower extremity with stand. Pt requires Min A for bed mobility to/from edge of bed. STS with Mod A and sequence verbal and tactile cues. Poor stand tolerance with heavy lean on rolling walker and posterior lean on bed; pt fatigues quickly with complaints of weakness and shortness of breath. O2 saturation 88% with heart rate in the mid 130's. Pt returned to sit and ultimately to bed. Several minutes recovery to heart rate of 101 and O2 saturation 90%. Continue PT to progress endurance and strength as tolerated with safe heart rate and O2 saturation levels to improve functional mobility.    Follow Up Recommendations  SNF     Equipment Recommendations       Recommendations for Other Services       Precautions / Restrictions Precautions Precautions: Fall Precaution Comments: ck O2 sats with effort Restrictions Weight Bearing Restrictions: No    Mobility  Bed Mobility Overal bed mobility: Needs Assistance Bed Mobility: Supine to Sit;Sit to Supine     Supine to sit: Min assist Sit to supine: Min assist   General bed mobility comments: for LEs  Transfers Overall transfer level: Needs assistance Equipment used: Rolling walker (2 wheeled);1 person hand held assist Transfers: Sit to/from Stand Sit to Stand: Mod assist         General transfer comment: cues for hand placement; hand over hand assist. Heavy use of posterior LEs against bed. Dizziness with stand and quick fatigue; O2 sats decreased to 88% and HR increased to mid 130s  Ambulation/Gait             General Gait Details: unable; difficulty tolerating  stand   Stairs            Wheelchair Mobility    Modified Rankin (Stroke Patients Only)       Balance Overall balance assessment: Needs assistance;History of Falls Sitting-balance support: Bilateral upper extremity supported;Feet supported Sitting balance-Leahy Scale: Good     Standing balance support: Bilateral upper extremity supported Standing balance-Leahy Scale: Poor                              Cognition Arousal/Alertness: Awake/alert Behavior During Therapy: WFL for tasks assessed/performed Overall Cognitive Status: Within Functional Limits for tasks assessed                                        Exercises General Exercises - Lower Extremity Long Arc Quad: AROM;Both;10 reps Hip Flexion/Marching: AROM;Both;10 reps;Seated(poor range) Toe Raises: AROM;Both;10 reps;Seated Heel Raises: AROM;Both;10 reps;Seated(poor range)    General Comments        Pertinent Vitals/Pain Pain Assessment: Faces Faces Pain Scale: Hurts even more Pain Location: distal RLE    Home Living                      Prior Function            PT Goals (current goals can now be found in the care  plan section) Progress towards PT goals: Progressing toward goals(slowly)    Frequency    Min 2X/week      PT Plan Current plan remains appropriate    Co-evaluation              AM-PAC PT "6 Clicks" Daily Activity  Outcome Measure  Difficulty turning over in bed (including adjusting bedclothes, sheets and blankets)?: A Lot Difficulty moving from lying on back to sitting on the side of the bed? : Unable Difficulty sitting down on and standing up from a chair with arms (e.g., wheelchair, bedside commode, etc,.)?: Unable Help needed moving to and from a bed to chair (including a wheelchair)?: A Lot Help needed walking in hospital room?: Total Help needed climbing 3-5 steps with a railing? : Total 6 Click Score: 8    End of Session  Equipment Utilized During Treatment: Gait belt;Oxygen Activity Tolerance: Patient limited by fatigue;Other (comment);Patient limited by pain(weakness) Patient left: in bed;with call bell/phone within reach;with bed alarm set;with family/visitor present   PT Visit Diagnosis: Unsteadiness on feet (R26.81);Muscle weakness (generalized) (M62.81);History of falling (Z91.81);Difficulty in walking, not elsewhere classified (R26.2);Adult, failure to thrive (R62.7)     Time: 1191-4782 PT Time Calculation (min) (ACUTE ONLY): 21 min  Charges:  $Therapeutic Activity: 8-22 mins                    G Codes:        Scot Dock, PTA 12/12/2017, 1:56 PM

## 2017-12-12 NOTE — Progress Notes (Signed)
SUBJECTIVE: Feeling much better this morning.   Vitals:   12/12/17 0416 12/12/17 0417 12/12/17 0746 12/12/17 0815  BP: 116/68  (!) 135/91   Pulse: 84  90   Resp: 18  16   Temp: (!) 97.5 F (36.4 C)  97.7 F (36.5 C)   TempSrc: Oral  Oral   SpO2: 96%  92% 95%  Weight:  194 lb 9.6 oz (88.3 kg)    Height:        Intake/Output Summary (Last 24 hours) at 12/12/2017 1056 Last data filed at 12/12/2017 0933 Gross per 24 hour  Intake 1040 ml  Output 1150 ml  Net -110 ml    LABS: Basic Metabolic Panel: Recent Labs    12/11/17 0451 12/12/17 0421  NA 132* 135  K 4.9 5.0  CL 98* 102  CO2 25 26  GLUCOSE 242* 244*  BUN 47* 50*  CREATININE 1.40* 1.52*  CALCIUM 9.4 9.8   Liver Function Tests: No results for input(s): AST, ALT, ALKPHOS, BILITOT, PROT, ALBUMIN in the last 72 hours. No results for input(s): LIPASE, AMYLASE in the last 72 hours. CBC: No results for input(s): WBC, NEUTROABS, HGB, HCT, MCV, PLT in the last 72 hours. Cardiac Enzymes: Recent Labs    12/10/17 1534 12/10/17 1924 12/10/17 2204  TROPONINI 0.18* 0.18* 0.19*   BNP: Invalid input(s): POCBNP D-Dimer: No results for input(s): DDIMER in the last 72 hours. Hemoglobin A1C: No results for input(s): HGBA1C in the last 72 hours. Fasting Lipid Panel: No results for input(s): CHOL, HDL, LDLCALC, TRIG, CHOLHDL, LDLDIRECT in the last 72 hours. Thyroid Function Tests: No results for input(s): TSH, T4TOTAL, T3FREE, THYROIDAB in the last 72 hours.  Invalid input(s): FREET3 Anemia Panel: No results for input(s): VITAMINB12, FOLATE, FERRITIN, TIBC, IRON, RETICCTPCT in the last 72 hours.   PHYSICAL EXAM General: Well developed, well nourished, in no acute distress HEENT:  Normocephalic and atramatic Neck:  No JVD.  Lungs: Clear bilaterally to auscultation and percussion. Heart: HRRR . Normal S1 and S2 without gallops or murmurs.  Abdomen: Bowel sounds are positive, abdomen soft and non-tender  Msk:  Back  normal, normal gait. Normal strength and tone for age. Extremities: No clubbing, cyanosis or edema.   Neuro: Alert and oriented X 3. Psych:  Good affect, responds appropriately  TELEMETRY: Atrial fibrillation with controlled ventricular rate.  ASSESSMENT AND PLAN: Congestive heart failure due to diastolic dysfunction as well as severe pulmonary hypertension. Atrial fibrillation with controlled ventricular rate. Continue current dosage of Lasix.  Principal Problem:   Acute on chronic diastolic CHF (congestive heart failure) (HCC) Active Problems:   GERD (gastroesophageal reflux disease)   COPD (chronic obstructive pulmonary disease) (HCC)   Essential hypertension   Diabetes mellitus (HCC)   Chronic atrial fibrillation (HCC)    Adrian BlackwaterKHAN,Merla Sawka A, MD, [facility] 12/12/2017 10:56 AM

## 2017-12-13 LAB — BASIC METABOLIC PANEL WITH GFR
Anion gap: 8 (ref 5–15)
BUN: 45 mg/dL — ABNORMAL HIGH (ref 6–20)
CO2: 27 mmol/L (ref 22–32)
Calcium: 9.7 mg/dL (ref 8.9–10.3)
Chloride: 100 mmol/L — ABNORMAL LOW (ref 101–111)
Creatinine, Ser: 1.21 mg/dL (ref 0.61–1.24)
GFR calc Af Amer: >60 mL/min (ref >60)
GFR calc non Af Amer: 57 mL/min — ABNORMAL LOW (ref >60)
Glucose, Bld: 215 mg/dL — ABNORMAL HIGH (ref 65–99)
Potassium: 5 mmol/L (ref 3.5–5.1)
Sodium: 135 mmol/L (ref 135–145)

## 2017-12-13 LAB — CBC
HCT: 48.2 % (ref 40.0–52.0)
Hemoglobin: 15.4 g/dL (ref 13.0–18.0)
MCH: 29.5 pg (ref 26.0–34.0)
MCHC: 31.9 g/dL — ABNORMAL LOW (ref 32.0–36.0)
MCV: 92.4 fL (ref 80.0–100.0)
PLATELETS: 165 10*3/uL (ref 150–440)
RBC: 5.22 MIL/uL (ref 4.40–5.90)
RDW: 15.7 % — AB (ref 11.5–14.5)
WBC: 9 10*3/uL (ref 3.8–10.6)

## 2017-12-13 LAB — GLUCOSE, CAPILLARY
GLUCOSE-CAPILLARY: 156 mg/dL — AB (ref 65–99)
GLUCOSE-CAPILLARY: 254 mg/dL — AB (ref 65–99)

## 2017-12-13 MED ORDER — INSULIN GLARGINE 100 UNIT/ML ~~LOC~~ SOLN: 10.0000 [IU] | Freq: Every day | SUBCUTANEOUS | 11 refills | Status: AC

## 2017-12-13 MED ORDER — INSULIN ASPART 100 UNIT/ML ~~LOC~~ SOLN: 0.0000 [IU] | Freq: Every day | SUBCUTANEOUS | 11 refills | Status: AC

## 2017-12-13 MED ORDER — PREDNISONE 10 MG (21) PO TBPK: 10.0000 mg | ORAL_TABLET | Freq: Every day | ORAL | 0 refills | Status: DC

## 2017-12-13 MED ORDER — NICOTINE 14 MG/24HR TD PT24: 14.0000 mg | MEDICATED_PATCH | Freq: Every day | TRANSDERMAL | 0 refills | Status: DC

## 2017-12-13 MED ORDER — OXYCODONE HCL 5 MG PO TABS: 5.0000 mg | ORAL_TABLET | ORAL | 0 refills | Status: AC | PRN

## 2017-12-13 MED ORDER — IPRATROPIUM-ALBUTEROL 0.5-2.5 (3) MG/3ML IN SOLN: 3.0000 mL | Freq: Four times a day (QID) | RESPIRATORY_TRACT | Status: AC

## 2017-12-13 MED ORDER — INSULIN ASPART 100 UNIT/ML ~~LOC~~ SOLN: 0.0000 [IU] | Freq: Three times a day (TID) | SUBCUTANEOUS | 11 refills | Status: AC

## 2017-12-13 MED ORDER — GUAIFENESIN-DM 100-10 MG/5ML PO SYRP: 10.0000 mL | ORAL_SOLUTION | Freq: Four times a day (QID) | ORAL | 0 refills | Status: AC | PRN

## 2017-12-13 MED ORDER — MUPIROCIN CALCIUM 2 % EX CREA: TOPICAL_CREAM | Freq: Two times a day (BID) | CUTANEOUS | 0 refills | Status: AC

## 2017-12-13 NOTE — Clinical Social Work Note (Addendum)
[facility] [address].  Patient and family agreeable to plans will transport via ems RN to call report [phone].  CSW updated patient's wife Wynona CanesChristine via phone to inform her that he is leaving today.  Windell MouldingEric Raney Antwine, MSW, Theresia MajorsLCSWA [phone]

## 2017-12-13 NOTE — Progress Notes (Signed)
Report called to [facility].  ?

## 2017-12-13 NOTE — Discharge Summary (Signed)
[facility] [place] [facility]   PATIENT NAME: Austin [name]    MR#:  [id number]  DATE OF BIRTH:  05-03-1941  DATE OF ADMISSION:  12/06/2017 ADMITTING PHYSICIAN: Oralia Manis, MD  DATE OF DISCHARGE: 12/13/2017  PRIMARY CARE PHYSICIAN: [facility]    ADMISSION DIAGNOSIS:  Hypoxia [R09.02] Congestive heart failure, unspecified HF chronicity, unspecified heart failure type (HCC) [I50.9]  DISCHARGE DIAGNOSIS:  Principal Problem:   Acute on chronic diastolic CHF (congestive heart failure) (HCC) Active Problems:   GERD (gastroesophageal reflux disease)   COPD (chronic obstructive pulmonary disease) (HCC)   Essential hypertension   Diabetes mellitus (HCC)   Chronic atrial fibrillation (HCC)   SECONDARY DIAGNOSIS:   Past Medical History:  Diagnosis Date  . Acute lower UTI 07/16/2017  . Asthma   . Atrial fibrillation (HCC)   . CHF (congestive heart failure) (HCC)   . Chronic atrial fibrillation (HCC)   . 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 COPD (chronic obstructive pulmonary disease) (HCC)   . Diabetes mellitus without complication (HCC)   . DVT (deep venous thrombosis) (HCC)   . Gastric ulcer   . GERD (gastroesophageal reflux disease)   . Hypercholesteremia   . Hypertension   . Moderate to severe pulmonary hypertension (HCC)--per 2-D echo 07/14/2017 07/15/2017  . PE (pulmonary embolism)   . Sepsis due to undetermined organism with acute respiratory failure (HCC) 07/13/2017    HOSPITAL COURSE:  HPI   Austin [name]  is a 77 y.o. male who presents with shortness of breath.  Patient states that for the last 3 or 4 days he has been getting extremely dyspneic even just walking across the room.  Patient has a history of DVT and PE in the past.  He also has history of heart failure.  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  Hospitalist were called  for admission and further evaluation and treatment.  * Altered mental status- 12/08/17   Metabolic encephalopathy due to hypoxia clinically improved,   During hospital course stat ABG confirmed hypoxia, need HFNC.   Pulm consult appreciated Clinically improved and at his baseline   Repeat X Ray on 12/10/2017 appears stable.   * Acute on chronic diastolic CHF (congestive heart failure) (HCC) -BNP was significantly elevated in the ED. Troponin was also 0.2.   Cardiology consult appreciated- as troponin stable, no need for further work ups.   negative CT angiogram for PE.    Due to worsening respi status- giving higher dose lasix 12/08/17. IV Lasix resumed.,  Closely monitor renal function Wean off oxygen as tolerated to baseline 2-3 L via nasal cannula  *COPD (chronic obstructive pulmonary disease) exacerbation (HCC) -   IV steroids, nebs, inhaled steroids, Abx.     Decreased dose of steroid as blood sugar is running high. And patient is improving.  * Possible pneumonia , pleural effusion   IV rocephin + azithromycin. Completed   Antibiotic course  *Essential hypertension -continue home meds  *Diabetes mellitus (HCC) -sliding scale insulin with corresponding glucose checks. Patient is on metformin, but looking at his history of chronic kidney disease - stopped.  *Chronic atrial fibrillation (HCC) -continue home rate controlling medications  *GERD (gastroesophageal reflux disease) -home dose PPI  * CKD stage III- monitor with lasix. Creatinine is 1.21  * Hyperglycemia- this is secondary to IV steroid use.  Taper steroids   Added Lantus and  continue insulin sliding scale coverage.     DISCHARGE CONDITIONS:   Stable   CONSULTS OBTAINED:  Treatment Team:  Alwyn Pea, MD Laurier Nancy, MD   PROCEDURES  NONE   DRUG ALLERGIES:   Allergies  Allergen Reactions  . Contrast Media [Iodinated Diagnostic Agents] Nausea And Vomiting  . Other  Other (See Comments)    Cannot have "high doses of anesthesia because of his lungs"    DISCHARGE MEDICATIONS:   Allergies as of 12/13/2017      Reactions   Contrast Media [iodinated Diagnostic Agents] Nausea And Vomiting   Other Other (See Comments)   Cannot have "high doses of anesthesia because of his lungs"      Medication List    STOP taking these medications   diltiazem 180 MG 24 hr capsule Commonly known as:  CARDIZEM CD   lisinopril 2.5 MG tablet Commonly known as:  PRINIVIL,ZESTRIL   sildenafil 20 MG tablet Commonly known as:  REVATIO     TAKE these medications   aspirin EC 81 MG tablet Take 81 mg by mouth daily.   budesonide-formoterol 160-4.5 MCG/ACT inhaler Commonly known as:  SYMBICORT Inhale 1 puff into the lungs 2 (two) times daily.   digoxin 0.125 MG tablet Commonly known as:  LANOXIN Take 1 tablet (0.125 mg total) by mouth daily.   diltiazem 240 MG 24 hr capsule Commonly known as:  DILACOR XR Take 240 mg by mouth daily.   furosemide 40 MG tablet Commonly known as:  LASIX Take 1 tablet (40 mg total) by mouth 2 (two) times daily. What changed:  when to take this   glipiZIDE 5 MG tablet Commonly known as:  GLUCOTROL Take 1 tablet (5 mg total) by mouth 2 (two) times daily. DO NOT TAKE THIS DIABETES MEDICINE IF YOUR BLOOD SUGAR IS LESS THAN 130.   guaiFENesin-dextromethorphan 100-10 MG/5ML syrup Commonly known as:  ROBITUSSIN DM Take 10 mLs by mouth every 6 (six) hours as needed for cough.   insulin aspart 100 UNIT/ML injection Commonly known as:  novoLOG Inject 0-5 Units into the skin at bedtime. CBG < 70: implement hypoglycemia protocol CBG 70 - 120: 0 units CBG 121 - 150: 0 units CBG 151 - 200: 0 units CBG 201 - 250: 2 units CBG 251 - 300: 3 units CBG 301 - 350: 4 units CBG 351 - 400: 5 units CBG > 400: call MD and obtain STAT lab verification   insulin aspart 100 UNIT/ML injection Commonly known as:  novoLOG Inject 0-15 Units into the  skin 3 (three) times daily with meals. CBG < 70: implement hypoglycemia protocol CBG 70 - 120: 0 units CBG 121 - 150: 2 units CBG 151 - 200: 3 units CBG 201 - 250: 5 units CBG 251 - 300: 8 units CBG 301 - 350: 11 units CBG 351 - 400: 15 units CBG > 400: call MD and obtain STAT lab verification   insulin glargine 100 UNIT/ML injection Commonly known as:  LANTUS Inject 0.1 mLs (10 Units total) into the skin daily.   ipratropium-albuterol 0.5-2.5 (3) MG/3ML Soln Commonly known as:  DUONEB Take 3 mLs by nebulization every 6 (six) hours.   levalbuterol 0.63 MG/3ML nebulizer solution Commonly known as:  XOPENEX Take 3 mLs (0.63 mg total) by nebulization every 4 (four) hours as needed for wheezing or shortness of breath.   lovastatin 40 MG tablet Commonly known as:  MEVACOR Take 40 mg by mouth at bedtime. Pt should take  with food.   mupirocin cream 2 % Commonly known as:  BACTROBAN Apply topically 2 (two) times daily.   nicotine 14 mg/24hr patch Commonly known as:  NICODERM CQ - dosed in mg/24 hours Place 1 patch (14 mg total) onto the skin daily. Start taking on:  12/14/2017   nitroGLYCERIN 0.4 MG SL tablet Commonly known as:  NITROSTAT Place 0.4 mg under the tongue every 5 (five) minutes x 3 doses as needed for chest pain. If no relief call 911 or go to emergency room.   omeprazole 40 MG capsule Commonly known as:  PRILOSEC Take 40 mg by mouth daily.   oxyCODONE 5 MG immediate release tablet Commonly known as:  Oxy IR/ROXICODONE Take 1 tablet (5 mg total) by mouth every 4 (four) hours as needed for moderate pain.   polyethylene glycol packet Commonly known as:  MIRALAX Take 17 g by mouth daily as needed for moderate constipation.   predniSONE 10 MG (21) Tbpk tablet Commonly known as:  STERAPRED UNI-PAK 21 TAB Take 1 tablet (10 mg total) by mouth daily. Take 6 tablets by mouth for 1 day followed by  5 tablets by mouth for 1 day followed by  4 tablets by mouth for 1 day  followed by  3 tablets by mouth for 1 day followed by  2 tablets by mouth for 1 day followed by  1 tablet by mouth for a day and stop   PROAIR HFA 108 (90 Base) MCG/ACT inhaler Generic drug:  albuterol Inhale 1-2 puffs into the lungs every 6 (six) hours as needed for wheezing or shortness of breath.        DISCHARGE INSTRUCTIONS:   daily weights and to notify MD/HF Clinic if weight increases greater than 2-3 pounds in one day or 5 pounds in a week. Also, No Added Salt diet.   Patient follow-up with CHF clinic Follow-up with primary care physician at the facility in 2-3 days or sooner as needed Follow-up with cardiology Dr. Juliann Pares in 1 week Follow-up with pulmonology kasa in 2 weeks   DIET:  Cardiac diet and Diabetic diet  DISCHARGE CONDITION:  Stable  ACTIVITY:  Activity as tolerated  OXYGEN:  Home Oxygen: Yes.     Oxygen Delivery: 4 liters/min via Patient connected to nasal cannula oxygen  DISCHARGE LOCATION:  nursing home   If you experience worsening of your admission symptoms, develop shortness of breath, life threatening emergency, suicidal or homicidal thoughts you must seek medical attention immediately by calling 911 or calling your MD immediately  if symptoms less severe.  You Must read complete instructions/literature along with all the possible adverse reactions/side effects for all the Medicines you take and that have been prescribed to you. Take any new Medicines after you have completely understood and accpet all the possible adverse reactions/side effects.   Please note  You were cared for by a hospitalist during your hospital stay. If you have any questions about your discharge medications or the care you received while you were in the hospital after you are discharged, you can call the unit and asked to speak with the hospitalist on call if the hospitalist that took care of you is not available. Once you are discharged, your primary care physician  will handle any further medical issues. Please note that NO REFILLS for any discharge medications will be authorized once you are discharged, as it is imperative that you return to your primary care physician (or establish a relationship with a primary care  physician if you do not have one) for your aftercare needs so that they can reassess your need for medications and monitor your lab values.     Today  Chief Complaint  Patient presents with  . Shortness of Breath   Patient is feeling weak and tired otherwise feeling fine.  On 4 L of oxygen via nasal cannula.  At home he was using 2-3 L of oxygen via nasal cannula.  ROS:  CONSTITUTIONAL: Denies fevers, chills. Denies any fatigue, weakness.  EYES: Denies blurry vision, double vision, eye pain. EARS, NOSE, THROAT: Denies tinnitus, ear pain, hearing loss. RESPIRATORY: Denies cough, wheeze, shortness of breath.  CARDIOVASCULAR: Denies chest pain, palpitations, edema.  GASTROINTESTINAL: Denies nausea, vomiting, diarrhea, abdominal pain. Denies bright red blood per rectum. GENITOURINARY: Denies dysuria, hematuria. ENDOCRINE: Denies nocturia or thyroid problems. HEMATOLOGIC AND LYMPHATIC: Denies easy bruising or bleeding. SKIN: Denies rash or lesion. MUSCULOSKELETAL: Denies pain in neck, back, shoulder, knees, hips or arthritic symptoms.  NEUROLOGIC: Denies paralysis, paresthesias.  PSYCHIATRIC: Denies anxiety or depressive symptoms.   VITAL SIGNS:  Blood pressure 118/81, pulse 93, temperature (!) 97.5 F (36.4 C), temperature source Oral, resp. rate 18, height 6\' 1"  (1.854 m), weight 89.5 kg (197 lb 6.4 oz), SpO2 96 %.  I/O:    Intake/Output Summary (Last 24 hours) at 12/13/2017 1154 Last data filed at 12/13/2017 1038 Gross per 24 hour  Intake 483 ml  Output 550 ml  Net -67 ml    PHYSICAL EXAMINATION:  GENERAL:  77 y.o.-year-old patient lying in the bed with no acute distress.  EYES: Pupils equal, round, reactive to light and  accommodation. No scleral icterus. Extraocular muscles intact.  HEENT: Head atraumatic, normocephalic. Oropharynx and nasopharynx clear.  NECK:  Supple, no jugular venous distention. No thyroid enlargement, no tenderness.  LUNGS: Normal breath sounds bilaterally, no wheezing, rales,rhonchi or crepitation. No use of accessory muscles of respiration.  CARDIOVASCULAR: S1, S2 normal. No murmurs, rubs, or gallops.  ABDOMEN: Soft, non-tender, non-distended. Bowel sounds present. No organomegaly or mass.  EXTREMITIES: No pedal edema, cyanosis, or clubbing.  NEUROLOGIC: Cranial nerves II through XII are intact. Muscle strength 5/5 in all extremities. Sensation intact. Gait not checked.  PSYCHIATRIC: The patient is alert and oriented x 3.  SKIN: No obvious rash, lesion, or ulcer.   DATA REVIEW:   CBC Recent Labs  Lab 12/13/17 0401  WBC 9.0  HGB 15.4  HCT 48.2  PLT 165    Chemistries  Recent Labs  Lab 12/13/17 0401  NA 135  K 5.0  CL 100*  CO2 27  GLUCOSE 215*  BUN 45*  CREATININE 1.21  CALCIUM 9.7    Cardiac Enzymes Recent Labs  Lab 12/10/17 2204  TROPONINI 0.19*    Microbiology Results  Results for orders placed or performed during the hospital encounter of 07/13/17  Blood Culture (routine x 2)     Status: None   Collection Time: 07/13/17  9:00 PM  Result Value Ref Range Status   Specimen Description BLOOD RIGHT FOREARM  Final   Special Requests   Final    Blood Culture results may not be optimal due to an inadequate volume of blood received in culture bottles   Culture NO GROWTH 5 DAYS  Final   Report Status 07/18/2017 FINAL  Final  Blood Culture (routine x 2)     Status: None   Collection Time: 07/13/17  9:00 PM  Result Value Ref Range Status   Specimen Description BLOOD LEFT FOREARM  Final   Special Requests   Final    Blood Culture results may not be optimal due to an inadequate volume of blood received in culture bottles   Culture NO GROWTH 5 DAYS  Final    Report Status 07/18/2017 FINAL  Final  MRSA PCR Screening     Status: None   Collection Time: 07/14/17 12:46 AM  Result Value Ref Range Status   MRSA by PCR NEGATIVE NEGATIVE Final    Comment:        The GeneXpert MRSA Assay (FDA approved for NASAL specimens only), is one component of a comprehensive MRSA colonization surveillance program. It is not intended to diagnose MRSA infection nor to guide or monitor treatment for MRSA infections.   Culture, Urine     Status: None   Collection Time: 07/15/17  2:15 PM  Result Value Ref Range Status   Specimen Description URINE, CLEAN CATCH  Final   Special Requests NONE  Final   Culture   Final    NO GROWTH Performed at [facility], [address] [place], [place] [address]    Report Status 07/17/2017 FINAL  Final    RADIOLOGY:  Dg Chest 1 View  Result Date: 12/10/2017 CLINICAL DATA:  Chest pain EXAM: CHEST 1 VIEW COMPARISON:  12/08/2017 FINDINGS: Cardiac shadow is enlarged. Right-sided pleural effusion is again identified as well as small left-sided pleural effusion. Right basilar infiltrate is again seen and stable. No new focal abnormality is noted. IMPRESSION: Stable changes in the right base with bilateral small effusions. Electronically Signed   By: Alcide CleverMark  Lukens M.D.   On: 12/10/2017 11:21    EKG:   Orders placed or performed during the hospital encounter of 12/06/17  . ED EKG within 10 minutes  . ED EKG within 10 minutes  . EKG 12-Lead  . EKG 12-Lead  . EKG 12-Lead  . EKG 12-Lead      Management plans discussed with the patient, family and they are in agreement.  CODE STATUS:     Code Status Orders  (From admission, onward)        Start     Ordered   12/07/17 0136  Full code  Continuous     12/07/17 0135    Code Status History    Date Active Date Inactive Code Status Order ID Comments User Context   11/06/2017 19:46 11/09/2017 14:55 Full Code [id number]  Houston SirenSainani, Vivek J, MD Inpatient   07/14/2017  00:55 07/21/2017 19:12 Full Code [id number]  Bobette Mortiz, David Manuel, MD Inpatient   12/26/2016 23:05 12/27/2016 20:34 Full Code [id number]  Auburn BilberryPatel, Shreyang, MD Inpatient   10/31/2015 09:34 11/03/2015 20:11 Full Code [id number]  Arnaldo Nataliamond, Michael S, MD Inpatient   09/16/2015 01:49 09/16/2015 18:22 Full Code [id number]  Arnaldo Nataliamond, Michael S, MD ED   08/24/2015 17:42 08/27/2015 16:10 Full Code [id number]  Auburn BilberryPatel, Shreyang, MD Inpatient   08/16/2015 03:55 08/17/2015 20:22 Full Code [id number]  Oralia ManisWillis, David, MD Inpatient   07/04/2015 14:22 07/08/2015 16:04 Full Code [id number]  Altamese DillingVachhani, Vaibhavkumar, MD Inpatient      TOTAL TIME TAKING CARE OF THIS PATIENT: 45  minutes.   Note: This dictation was prepared with Dragon dictation along with smaller phrase technology. Any transcriptional errors that result from this process are unintentional.   @[facility] @  on 12/13/2017 at 11:54 AM  Between 7am to 6pm - Pager - [phone]  After 6pm go to [email or web address] - Scientist, research (life sciences)password [email or web address]  [facility]  Office  [phone]  CC: Primary care physician; [facility]

## 2017-12-13 NOTE — Clinical Social Work Placement (Signed)
   CLINICAL SOCIAL WORK PLACEMENT  NOTE  Date:  12/13/2017  Patient Details  Name: Austin [name] MRN: [id number] Date of Birth: 1941/05/06  Clinical Social Work is seeking post-discharge placement for this patient at the Skilled  Nursing Facility level of care (*CSW will initial, date and re-position this form in  chart as items are completed):  Yes   Patient/family provided with [place] Clinical Social Work Department's list of facilities offering this level of care within the geographic area requested by the patient (or if unable, by the patient's family).  Yes   Patient/family informed of their freedom to choose among providers that offer the needed level of care, that participate in Medicare, Medicaid or managed care program needed by the patient, have an available bed and are willing to accept the patient.  Yes   Patient/family informed of [facility]'s ownership interest in [facility] and [facility], as well as of the fact that they are under no obligation to receive care at these facilities.  PASRR submitted to EDS on 12/11/17     PASRR number received on 12/11/17     Existing PASRR number confirmed on       FL2 transmitted to all facilities in geographic area requested by pt/family on 12/11/17     FL2 transmitted to all facilities within larger geographic area on       Patient informed that his/her managed care company has contracts with or will negotiate with certain facilities, including the following:        Yes   Patient/family informed of bed offers received.  [facility]     Physician recommends and patient chooses bed at      [facility] to [facility] on 12/13/17.  [facility]     Patient family notified on 12/13/17 of transfer.  Name of family member notified:  Patient's wife Wynona CanesChristine     PHYSICIAN Please sign FL2      Additional Comment:    _______________________________________________ Darleene CleaverAnterhaus, Helio Lack R, LCSWA 12/13/2017, 12:00 PM

## 2017-12-13 NOTE — Discharge Instructions (Signed)
daily weights and to notify MD/HF Clinic if weight increases greater than 2-3 pounds in one day or 5 pounds in a week. Also, No Added Salt diet.   Patient follow-up with CHF clinic Follow-up with primary care physician at the facility in 2-3 days or sooner as needed Follow-up with cardiology Dr. Welton FlakesKhan in 1 week Follow-up with pulmonology kasa in 2 weeks

## 2017-12-13 NOTE — Progress Notes (Signed)
Patient stable for discharge [facility].  PIV discontinued, catheter intact; site clean, dry, intact. Discharge instructions and follow-up reviewed. Patient verbalized understanding.   This RN called number left by wife.  Left message with grandson regarding location of discharge paperwork and prescriptions and to call this RN if clarification needed.  EMS at bedside to [facility].

## 2017-12-13 NOTE — Progress Notes (Signed)
EMS dispatch called for transport.   

## 2017-12-13 NOTE — Progress Notes (Signed)
Inpatient Diabetes Program Recommendations  AACE/ADA: New Consensus Statement on Inpatient Glycemic Control (2015)  Target Ranges:  Prepandial:   less than 140 mg/dL      Peak postprandial:   less than 180 mg/dL (1-2 hours)      Critically ill patients:  140 - 180 mg/dL   Results for Austin [name], Austin [name] (MRN [id number]) as of 12/13/2017 08:04  Ref. Range 12/12/2017 07:46 12/12/2017 11:50 12/12/2017 17:00 12/12/2017 21:02  Glucose-Capillary Latest Ref Range: 65 - 99 mg/dL 409205 (H) 811304 (H) 914335 (H) 246 (H)    Admit with:SOB  History:DM, CHF  Home DM Meds:Metformin 500 mg BID Glipizide 5 mg BID  Current Insulin Orders:Novolog Moderate Correction Scale/ SSI (0-15 units) TID AC + HS                                     Lantus 10 units daily       Note patient getting Solumedrol 40 mg daily.   CBGs quite elevated. Home oral DM medications currently on hold.    Lantus started on 12/31.   MD- Please consider the following in-hospital insulin adjustments while patient still receiving IV steroids:  1. Increase Lantus to 15 units daily  2. Start Novolog Meal Coverage: Novolog 4 units TID with meals (hold if pt eats <50% of meal)     --Will follow patient during hospitalization--  Ambrose FinlandJeannine Johnston Jantzen Pilger RN, MSN, CDE Diabetes Coordinator Inpatient Glycemic Control Team Team Pager: [phone] (8a-5p)

## 2017-12-17 ENCOUNTER — Ambulatory Visit: Payer: Medicare Other | Admitting: Family

## 2017-12-26 ENCOUNTER — Other Ambulatory Visit: Payer: Self-pay

## 2017-12-26 ENCOUNTER — Inpatient Hospital Stay
Admission: EM | Admit: 2017-12-26 | Discharge: 2017-12-29 | DRG: 694 | Disposition: A | Discharge status: Skilled Nursing Facility | Payer: Medicare Other | Attending: Internal Medicine | Admitting: Internal Medicine

## 2017-12-26 ENCOUNTER — Emergency Department: Payer: Medicare Other

## 2017-12-26 ENCOUNTER — Inpatient Hospital Stay: Payer: Medicare Other

## 2017-12-26 ENCOUNTER — Encounter: Payer: Self-pay | Admitting: Emergency Medicine

## 2017-12-26 DIAGNOSIS — K4030 Unilateral inguinal hernia, with obstruction, without gangrene, not specified as recurrent: Secondary | ICD-10-CM | POA: Diagnosis present

## 2017-12-26 DIAGNOSIS — N139 Obstructive and reflux uropathy, unspecified: Secondary | ICD-10-CM | POA: Diagnosis not present

## 2017-12-26 DIAGNOSIS — J90 Pleural effusion, not elsewhere classified: Secondary | ICD-10-CM | POA: Diagnosis present

## 2017-12-26 DIAGNOSIS — K4090 Unilateral inguinal hernia, without obstruction or gangrene, not specified as recurrent: Secondary | ICD-10-CM

## 2017-12-26 DIAGNOSIS — I130 Hypertensive heart and chronic kidney disease with heart failure and stage 1 through stage 4 chronic kidney disease, or unspecified chronic kidney disease: Secondary | ICD-10-CM | POA: Diagnosis present

## 2017-12-26 DIAGNOSIS — I272 Pulmonary hypertension, unspecified: Secondary | ICD-10-CM | POA: Diagnosis present

## 2017-12-26 DIAGNOSIS — E1122 Type 2 diabetes mellitus with diabetic chronic kidney disease: Secondary | ICD-10-CM | POA: Diagnosis present

## 2017-12-26 DIAGNOSIS — Z9981 Dependence on supplemental oxygen: Secondary | ICD-10-CM

## 2017-12-26 DIAGNOSIS — Z7951 Long term (current) use of inhaled steroids: Secondary | ICD-10-CM

## 2017-12-26 DIAGNOSIS — Z86711 Personal history of pulmonary embolism: Secondary | ICD-10-CM

## 2017-12-26 DIAGNOSIS — N179 Acute kidney failure, unspecified: Secondary | ICD-10-CM | POA: Diagnosis present

## 2017-12-26 DIAGNOSIS — Z86718 Personal history of other venous thrombosis and embolism: Secondary | ICD-10-CM | POA: Diagnosis not present

## 2017-12-26 DIAGNOSIS — N184 Chronic kidney disease, stage 4 (severe): Secondary | ICD-10-CM | POA: Diagnosis present

## 2017-12-26 DIAGNOSIS — K567 Ileus, unspecified: Secondary | ICD-10-CM | POA: Diagnosis not present

## 2017-12-26 DIAGNOSIS — Z794 Long term (current) use of insulin: Secondary | ICD-10-CM

## 2017-12-26 DIAGNOSIS — J449 Chronic obstructive pulmonary disease, unspecified: Secondary | ICD-10-CM | POA: Diagnosis present

## 2017-12-26 DIAGNOSIS — K219 Gastro-esophageal reflux disease without esophagitis: Secondary | ICD-10-CM | POA: Diagnosis present

## 2017-12-26 DIAGNOSIS — F17210 Nicotine dependence, cigarettes, uncomplicated: Secondary | ICD-10-CM | POA: Diagnosis present

## 2017-12-26 DIAGNOSIS — D72829 Elevated white blood cell count, unspecified: Secondary | ICD-10-CM | POA: Diagnosis present

## 2017-12-26 DIAGNOSIS — Z8711 Personal history of peptic ulcer disease: Secondary | ICD-10-CM | POA: Diagnosis not present

## 2017-12-26 DIAGNOSIS — R000 Tachycardia, unspecified: Secondary | ICD-10-CM | POA: Diagnosis present

## 2017-12-26 DIAGNOSIS — N320 Bladder-neck obstruction: Secondary | ICD-10-CM | POA: Diagnosis present

## 2017-12-26 DIAGNOSIS — I5032 Chronic diastolic (congestive) heart failure: Secondary | ICD-10-CM | POA: Diagnosis present

## 2017-12-26 DIAGNOSIS — Z8249 Family history of ischemic heart disease and other diseases of the circulatory system: Secondary | ICD-10-CM

## 2017-12-26 DIAGNOSIS — J9611 Chronic respiratory failure with hypoxia: Secondary | ICD-10-CM | POA: Diagnosis present

## 2017-12-26 DIAGNOSIS — N200 Calculus of kidney: Secondary | ICD-10-CM | POA: Diagnosis not present

## 2017-12-26 DIAGNOSIS — I700 Atherosclerosis of aorta: Secondary | ICD-10-CM | POA: Diagnosis present

## 2017-12-26 DIAGNOSIS — N132 Hydronephrosis with renal and ureteral calculous obstruction: Principal | ICD-10-CM | POA: Diagnosis present

## 2017-12-26 DIAGNOSIS — Z87442 Personal history of urinary calculi: Secondary | ICD-10-CM

## 2017-12-26 DIAGNOSIS — R109 Unspecified abdominal pain: Secondary | ICD-10-CM | POA: Diagnosis present

## 2017-12-26 DIAGNOSIS — E785 Hyperlipidemia, unspecified: Secondary | ICD-10-CM | POA: Diagnosis present

## 2017-12-26 DIAGNOSIS — Z91041 Radiographic dye allergy status: Secondary | ICD-10-CM

## 2017-12-26 DIAGNOSIS — I482 Chronic atrial fibrillation: Secondary | ICD-10-CM | POA: Diagnosis present

## 2017-12-26 DIAGNOSIS — N1330 Unspecified hydronephrosis: Secondary | ICD-10-CM

## 2017-12-26 DIAGNOSIS — Z7982 Long term (current) use of aspirin: Secondary | ICD-10-CM

## 2017-12-26 DIAGNOSIS — Z79899 Other long term (current) drug therapy: Secondary | ICD-10-CM

## 2017-12-26 HISTORY — PX: IR NEPHROSTOMY PLACEMENT LEFT: IMG6063

## 2017-12-26 LAB — CBC WITH DIFFERENTIAL/PLATELET
BASOS ABS: 0.1 10*3/uL (ref 0–0.1)
Basophils Relative: 0 %
EOS PCT: 0 %
Eosinophils Absolute: 0 10*3/uL (ref 0–0.7)
HCT: 49.5 % (ref 40.0–52.0)
Hemoglobin: 15.8 g/dL (ref 13.0–18.0)
LYMPHS PCT: 1 %
Lymphs Abs: 0.2 10*3/uL — ABNORMAL LOW (ref 1.0–3.6)
MCH: 29.4 pg (ref 26.0–34.0)
MCHC: 31.9 g/dL — AB (ref 32.0–36.0)
MCV: 92.2 fL (ref 80.0–100.0)
MONO ABS: 1.2 10*3/uL — AB (ref 0.2–1.0)
MONOS PCT: 6 %
Neutro Abs: 18.9 10*3/uL — ABNORMAL HIGH (ref 1.4–6.5)
Neutrophils Relative %: 93 %
PLATELETS: 126 10*3/uL — AB (ref 150–440)
RBC: 5.37 MIL/uL (ref 4.40–5.90)
RDW: 17 % — AB (ref 11.5–14.5)
WBC: 20.3 10*3/uL — ABNORMAL HIGH (ref 3.8–10.6)

## 2017-12-26 LAB — COMPREHENSIVE METABOLIC PANEL
ALK PHOS: 90 U/L (ref 38–126)
ALT: 20 U/L (ref 17–63)
ANION GAP: 12 (ref 5–15)
AST: 28 U/L (ref 15–41)
Albumin: 3.4 g/dL — ABNORMAL LOW (ref 3.5–5.0)
BILIRUBIN TOTAL: 1.8 mg/dL — AB (ref 0.3–1.2)
BUN: 51 mg/dL — ABNORMAL HIGH (ref 6–20)
CALCIUM: 9.6 mg/dL (ref 8.9–10.3)
CO2: 24 mmol/L (ref 22–32)
CREATININE: 2.66 mg/dL — AB (ref 0.61–1.24)
Chloride: 97 mmol/L — ABNORMAL LOW (ref 101–111)
GFR, EST AFRICAN AMERICAN: 25 mL/min — AB (ref >60)
GFR, EST NON AFRICAN AMERICAN: 22 mL/min — AB (ref >60)
Glucose, Bld: 118 mg/dL — ABNORMAL HIGH (ref 65–99)
Potassium: 4.9 mmol/L (ref 3.5–5.1)
SODIUM: 133 mmol/L — AB (ref 135–145)
TOTAL PROTEIN: 6.2 g/dL — AB (ref 6.5–8.1)

## 2017-12-26 LAB — URINALYSIS, COMPLETE (UACMP) WITH MICROSCOPIC
BACTERIA UA: NONE SEEN
BILIRUBIN URINE: NEGATIVE
GLUCOSE, UA: NEGATIVE mg/dL
KETONES UR: NEGATIVE mg/dL
Leukocytes, UA: NEGATIVE
Nitrite: NEGATIVE
Protein, ur: 30 mg/dL — AB
Specific Gravity, Urine: 1.014 (ref 1.005–1.030)
Squamous Epithelial / LPF: NONE SEEN
pH: 5 (ref 5.0–8.0)

## 2017-12-26 LAB — PROTIME-INR
INR: 1.31
Prothrombin Time: 16.2 seconds — ABNORMAL HIGH (ref 11.4–15.2)

## 2017-12-26 LAB — DIGOXIN LEVEL: DIGOXIN LVL: 1.6 ng/mL (ref 0.8–2.0)

## 2017-12-26 LAB — HEMOGLOBIN A1C
Hgb A1c MFr Bld: 8.4 % — ABNORMAL HIGH (ref 4.8–5.6)
Mean Plasma Glucose: 194.38 mg/dL

## 2017-12-26 LAB — GLUCOSE, CAPILLARY: Glucose-Capillary: 79 mg/dL (ref 65–99)

## 2017-12-26 LAB — LACTIC ACID, PLASMA
Lactic Acid, Venous: 2.2 mmol/L (ref 0.5–1.9)
Lactic Acid, Venous: 2.3 mmol/L (ref 0.5–1.9)

## 2017-12-26 LAB — MRSA PCR SCREENING: MRSA BY PCR: NEGATIVE

## 2017-12-26 LAB — LIPASE, BLOOD: LIPASE: 21 U/L (ref 11–51)

## 2017-12-26 LAB — BRAIN NATRIURETIC PEPTIDE: B Natriuretic Peptide: 618 pg/mL — ABNORMAL HIGH (ref 0.0–100.0)

## 2017-12-26 MED ORDER — ENOXAPARIN SODIUM 30 MG/0.3ML ~~LOC~~ SOLN
30.0000 mg | SUBCUTANEOUS | Status: DC
  Administered 2017-12-27 – 2017-12-29 (×3): 30 mg via SUBCUTANEOUS
  Filled 2017-12-26 (×3): qty 0.3

## 2017-12-26 MED ORDER — SODIUM CHLORIDE 0.9 % IV SOLN
INTRAVENOUS | Status: DC
  Administered 2017-12-26 – 2017-12-28 (×4): via INTRAVENOUS

## 2017-12-26 MED ORDER — HYDRALAZINE HCL 20 MG/ML IJ SOLN: 10.0000 mg | Freq: Four times a day (QID) | INTRAMUSCULAR | Status: DC | PRN

## 2017-12-26 MED ORDER — SODIUM CHLORIDE 0.9 % IV SOLN: Freq: Once | INTRAVENOUS | Status: DC

## 2017-12-26 MED ORDER — MIDAZOLAM HCL 5 MG/5ML IJ SOLN
INTRAMUSCULAR | Status: AC
  Filled 2017-12-26: qty 5

## 2017-12-26 MED ORDER — NICOTINE 14 MG/24HR TD PT24
14.0000 mg | MEDICATED_PATCH | Freq: Every day | TRANSDERMAL | Status: DC
  Administered 2017-12-26 – 2017-12-29 (×4): 14 mg via TRANSDERMAL
  Filled 2017-12-26 (×4): qty 1

## 2017-12-26 MED ORDER — POLYETHYLENE GLYCOL 3350 17 GM/SCOOP PO POWD
1.0000 | Freq: Once | ORAL | Status: DC
  Filled 2017-12-26: qty 255

## 2017-12-26 MED ORDER — ACETAMINOPHEN 650 MG RE SUPP
650.0000 mg | Freq: Four times a day (QID) | RECTAL | Status: DC | PRN
  Administered 2017-12-27: 650 mg via RECTAL
  Filled 2017-12-26: qty 1

## 2017-12-26 MED ORDER — PIPERACILLIN-TAZOBACTAM 3.375 G IVPB 30 MIN
3.3750 g | Freq: Once | INTRAVENOUS | Status: AC
  Administered 2017-12-26: 3.375 g via INTRAVENOUS
  Filled 2017-12-26: qty 50

## 2017-12-26 MED ORDER — PIPERACILLIN-TAZOBACTAM 3.375 G IVPB
3.3750 g | Freq: Three times a day (TID) | INTRAVENOUS | Status: DC
  Administered 2017-12-26 – 2017-12-28 (×5): 3.375 g via INTRAVENOUS
  Filled 2017-12-26 (×5): qty 50

## 2017-12-26 MED ORDER — IPRATROPIUM-ALBUTEROL 0.5-2.5 (3) MG/3ML IN SOLN
3.0000 mL | Freq: Four times a day (QID) | RESPIRATORY_TRACT | Status: DC | PRN
  Administered 2017-12-26 – 2017-12-28 (×2): 3 mL via RESPIRATORY_TRACT
  Filled 2017-12-26 (×2): qty 3

## 2017-12-26 MED ORDER — LIDOCAINE HCL (PF) 1 % IJ SOLN
INTRAMUSCULAR | Status: AC
  Filled 2017-12-26: qty 30

## 2017-12-26 MED ORDER — BISACODYL 5 MG PO TBEC
5.0000 mg | DELAYED_RELEASE_TABLET | Freq: Every day | ORAL | Status: DC | PRN
Start: 2017-12-26 — End: 2017-12-29

## 2017-12-26 MED ORDER — ONDANSETRON HCL 4 MG/2ML IJ SOLN: 4.0000 mg | Freq: Four times a day (QID) | INTRAMUSCULAR | Status: DC | PRN

## 2017-12-26 MED ORDER — SENNA 8.6 MG PO TABS
1.0000 | ORAL_TABLET | Freq: Every day | ORAL | Status: DC
  Administered 2017-12-27 – 2017-12-29 (×3): 8.6 mg via ORAL
  Filled 2017-12-26 (×4): qty 1

## 2017-12-26 MED ORDER — SODIUM CHLORIDE 0.9 % IV BOLUS (SEPSIS): 500.0000 mL | Freq: Once | INTRAVENOUS | Status: DC

## 2017-12-26 MED ORDER — ORAL CARE MOUTH RINSE
15.0000 mL | Freq: Two times a day (BID) | OROMUCOSAL | Status: DC
  Administered 2017-12-27 – 2017-12-29 (×5): 15 mL via OROMUCOSAL

## 2017-12-26 MED ORDER — IOHEXOL 300 MG/ML  SOLN: 50.0000 mL | Freq: Once | INTRAMUSCULAR | Status: DC | PRN

## 2017-12-26 MED ORDER — PANTOPRAZOLE SODIUM 40 MG IV SOLR
40.0000 mg | Freq: Two times a day (BID) | INTRAVENOUS | Status: DC
  Administered 2017-12-26 – 2017-12-27 (×3): 40 mg via INTRAVENOUS
  Filled 2017-12-26 (×4): qty 40

## 2017-12-26 MED ORDER — INSULIN ASPART 100 UNIT/ML ~~LOC~~ SOLN: 0.0000 [IU] | Freq: Three times a day (TID) | SUBCUTANEOUS | Status: DC

## 2017-12-26 MED ORDER — POLYETHYLENE GLYCOL 3350 17 G PO PACK: 17.0000 g | PACK | Freq: Every day | ORAL | Status: DC | PRN

## 2017-12-26 MED ORDER — MIDAZOLAM HCL 5 MG/5ML IJ SOLN
INTRAMUSCULAR | Status: AC | PRN
  Administered 2017-12-26 (×2): 1 mg via INTRAVENOUS

## 2017-12-26 MED ORDER — ENOXAPARIN SODIUM 30 MG/0.3ML ~~LOC~~ SOLN: 30.0000 mg | Freq: Every day | SUBCUTANEOUS | Status: DC

## 2017-12-26 MED ORDER — BISACODYL 10 MG RE SUPP
10.0000 mg | Freq: Once | RECTAL | Status: AC
  Administered 2017-12-26: 10 mg via RECTAL
  Filled 2017-12-26 (×2): qty 1

## 2017-12-26 MED ORDER — MAGNESIUM CITRATE PO SOLN
1.0000 | Freq: Once | ORAL | Status: DC | PRN
  Filled 2017-12-26: qty 296

## 2017-12-26 MED ORDER — DOCUSATE SODIUM 100 MG PO CAPS
100.0000 mg | ORAL_CAPSULE | Freq: Two times a day (BID) | ORAL | Status: DC
  Administered 2017-12-27 – 2017-12-29 (×5): 100 mg via ORAL
  Filled 2017-12-26 (×6): qty 1

## 2017-12-26 MED ORDER — METOPROLOL TARTRATE 5 MG/5ML IV SOLN
5.0000 mg | Freq: Four times a day (QID) | INTRAVENOUS | Status: DC
  Administered 2017-12-26 – 2017-12-28 (×9): 5 mg via INTRAVENOUS
  Filled 2017-12-26 (×10): qty 5

## 2017-12-26 MED ORDER — FENTANYL CITRATE (PF) 100 MCG/2ML IJ SOLN
INTRAMUSCULAR | Status: AC | PRN
  Administered 2017-12-26: 25 ug via INTRAVENOUS
  Administered 2017-12-26: 50 ug via INTRAVENOUS

## 2017-12-26 MED ORDER — FENTANYL CITRATE (PF) 100 MCG/2ML IJ SOLN
INTRAMUSCULAR | Status: AC
  Filled 2017-12-26: qty 4

## 2017-12-26 MED ORDER — SODIUM CHLORIDE 0.9% FLUSH
5.0000 mL | Freq: Three times a day (TID) | INTRAVENOUS | Status: DC
  Administered 2017-12-26 – 2017-12-29 (×8): 5 mL via INTRAVENOUS

## 2017-12-26 MED ORDER — LIDOCAINE HCL (PF) 1 % IJ SOLN
INTRAMUSCULAR | Status: AC
Start: 2017-12-26 — End: ?
  Filled 2017-12-26: qty 30

## 2017-12-26 MED ORDER — ENOXAPARIN SODIUM 40 MG/0.4ML ~~LOC~~ SOLN: 40.0000 mg | Freq: Every day | SUBCUTANEOUS | Status: DC

## 2017-12-26 MED ORDER — ONDANSETRON HCL 4 MG PO TABS: 4.0000 mg | ORAL_TABLET | Freq: Four times a day (QID) | ORAL | Status: DC | PRN

## 2017-12-26 MED ORDER — PIPERACILLIN-TAZOBACTAM 3.375 G IVPB: 3.3750 g | Freq: Two times a day (BID) | INTRAVENOUS | Status: DC

## 2017-12-26 MED ORDER — TRAMADOL HCL 50 MG PO TABS: 50.0000 mg | ORAL_TABLET | Freq: Four times a day (QID) | ORAL | Status: DC | PRN

## 2017-12-26 MED ORDER — ACETAMINOPHEN 325 MG PO TABS: 650.0000 mg | ORAL_TABLET | Freq: Four times a day (QID) | ORAL | Status: DC | PRN

## 2017-12-26 NOTE — Consult Note (Signed)
PULMONARY / CRITICAL CARE MEDICINE   Name: Austin [name] MRN: [id number] DOB: 19-Jul-1941    ADMISSION DATE:  12/26/2017 PT PROFILE: 88 M SNF resident with multiple chronic medical problems, referred to [facility] ED with abdominal distention, abdominal pain and "constipation".  CTAP revealed obstructive uropathy, left ureteral stone, concern for bowel obstruction, left inguinal hernia.  Seen by general surgery (Dr. Excell Seltzer) who is able to reduce inguinal hernia and did not believe that there was an indication for urgent surgery.  Admitted to SDU due to multiple acute problems and overall frailty.  HISTORY OF PRESENT ILLNESS:   As above.  On my evaluation, he still had mild abdominal pain and distention but was not in a great deal of discomfort.  He denied dyspnea.  Other than above, he voiced no new complaints.  PAST MEDICAL HISTORY :  He  has a past medical history of Acute lower UTI (07/16/2017), Asthma, Atrial fibrillation (HCC), CHF (congestive heart failure) (HCC), Chronic atrial fibrillation (HCC), Chronic right-sided CHF (congestive heart failure) (HCC) (07/21/2017), COPD (chronic obstructive pulmonary disease) (HCC), Diabetes mellitus without complication (HCC), DVT (deep venous thrombosis) (HCC), Gastric ulcer, GERD (gastroesophageal reflux disease), Hypercholesteremia, Hypertension, Moderate to severe pulmonary hypertension (HCC)--per 2-D echo 07/14/2017 (07/15/2017), PE (pulmonary embolism), and Sepsis due to undetermined organism with acute respiratory failure (HCC) (07/13/2017).  PAST SURGICAL HISTORY: He  has a past surgical history that includes Hernia repair; Prostate surgery; Cardiac catheterization (N/A, 11/03/2015); and Cardiac catheterization (N/A, 07/24/2016).  Allergies  Allergen Reactions  . Contrast Media [Iodinated Diagnostic Agents] Nausea And Vomiting  . Other Other (See Comments)    Cannot have "high doses of anesthesia because of his lungs"    No current  facility-administered medications on file prior to encounter.    Current Outpatient Medications on File Prior to Encounter  Medication Sig  . aspirin EC 81 MG tablet Take 81 mg by mouth daily.  . budesonide-formoterol (SYMBICORT) 160-4.5 MCG/ACT inhaler Inhale 1 puff into the lungs 2 (two) times daily.  . digoxin (LANOXIN) 0.125 MG tablet Take 1 tablet (0.125 mg total) by mouth daily.  Marland Kitchen diltiazem (DILACOR XR) 240 MG 24 hr capsule Take 240 mg by mouth daily.  . furosemide (LASIX) 40 MG tablet Take 1 tablet (40 mg total) by mouth 2 (two) times daily.  Marland Kitchen glipiZIDE (GLUCOTROL) 5 MG tablet Take 1 tablet (5 mg total) by mouth 2 (two) times daily. DO NOT TAKE THIS DIABETES MEDICINE IF YOUR BLOOD SUGAR IS LESS THAN 130.  Marland Kitchen guaiFENesin (MUCINEX) 600 MG 12 hr tablet Take by mouth 2 (two) times daily.  . insulin glargine (LANTUS) 100 UNIT/ML injection Inject 0.1 mLs (10 Units total) into the skin daily.  . insulin lispro (HUMALOG) 100 UNIT/ML injection Inject 0-9 Units into the skin 3 (three) times daily before meals. SSI: 0-99 = O UNITS, 100-149 = 2 UNITS, 150-199 = 3 UNITS, 200-249 = 4 UNITS, 250-299 = 5 UNITS; 300-349 = 6 UNITS 350-399 = 7 UNITS 400-449 = 8 UNITS 450-499 = 9 UNITS, 500 OR > CALL MD  . ipratropium-albuterol (DUONEB) 0.5-2.5 (3) MG/3ML SOLN Take 3 mLs by nebulization every 6 (six) hours.  Marland Kitchen lactulose (CHRONULAC) 10 GM/15ML solution Take 10 g by mouth 2 (two) times daily.  Marland Kitchen lovastatin (MEVACOR) 20 MG tablet Take 20 mg by mouth at bedtime. Pt should take with food.   . mupirocin cream (BACTROBAN) 2 % Apply topically 2 (two) times daily.  . nitroGLYCERIN (NITROSTAT) 0.4 MG SL tablet Place  0.4 mg under the tongue every 5 (five) minutes x 3 doses as needed for chest pain. If no relief call 911 or go to emergency room.  Marland Kitchen omeprazole (PRILOSEC) 40 MG capsule Take 40 mg by mouth daily.  Marland Kitchen albuterol (PROAIR HFA) 108 (90 Base) MCG/ACT inhaler Inhale 1-2 puffs into the lungs every 6 (six) hours as  needed for wheezing or shortness of breath.  . guaiFENesin-dextromethorphan (ROBITUSSIN DM) 100-10 MG/5ML syrup Take 10 mLs by mouth every 6 (six) hours as needed for cough. (Patient not taking: Reported on 12/26/2017)  . insulin aspart (NOVOLOG) 100 UNIT/ML injection Inject 0-5 Units into the skin at bedtime. CBG < 70: implement hypoglycemia protocol CBG 70 - 120: 0 units CBG 121 - 150: 0 units CBG 151 - 200: 0 units CBG 201 - 250: 2 units CBG 251 - 300: 3 units CBG 301 - 350: 4 units CBG 351 - 400: 5 units CBG > 400: call MD and obtain STAT lab verification (Patient not taking: Reported on 12/26/2017)  . insulin aspart (NOVOLOG) 100 UNIT/ML injection Inject 0-15 Units into the skin 3 (three) times daily with meals. CBG < 70: implement hypoglycemia protocol CBG 70 - 120: 0 units CBG 121 - 150: 2 units CBG 151 - 200: 3 units CBG 201 - 250: 5 units CBG 251 - 300: 8 units CBG 301 - 350: 11 units CBG 351 - 400: 15 units CBG > 400: call MD and obtain STAT lab verification (Patient not taking: Reported on 12/26/2017)  . levalbuterol (XOPENEX) 0.63 MG/3ML nebulizer solution Take 3 mLs (0.63 mg total) by nebulization every 4 (four) hours as needed for wheezing or shortness of breath.  . nicotine (NICODERM CQ - DOSED IN MG/24 HOURS) 14 mg/24hr patch Place 1 patch (14 mg total) onto the skin daily. (Patient not taking: Reported on 12/26/2017)  . oxyCODONE (OXY IR/ROXICODONE) 5 MG immediate release tablet Take 1 tablet (5 mg total) by mouth every 4 (four) hours as needed for moderate pain. (Patient not taking: Reported on 12/26/2017)  . polyethylene glycol (MIRALAX) packet Take 17 g by mouth daily as needed for moderate constipation.  . predniSONE (STERAPRED UNI-PAK 21 TAB) 10 MG (21) TBPK tablet Take 1 tablet (10 mg total) by mouth daily. Take 6 tablets by mouth for 1 day followed by  5 tablets by mouth for 1 day followed by  4 tablets by mouth for 1 day followed by  3 tablets by mouth for 1 day  followed by  2 tablets by mouth for 1 day followed by  1 tablet by mouth for a day and stop (Patient not taking: Reported on 12/26/2017)    FAMILY HISTORY:  His indicated that his mother is deceased. He indicated that his father is deceased. He indicated that the status of his brother is unknown.   SOCIAL HISTORY: He  reports that he has been smoking cigarettes.  He has a 45.00 pack-year smoking history. he has never used smokeless tobacco. He reports that he does not drink alcohol or use drugs.  REVIEW OF SYSTEMS:   A detailed review of systems was otherwise negative  SUBJECTIVE:    VITAL SIGNS: BP (!) 120/91 (BP Location: Right Arm)   Pulse (!) 105   Temp 97.9 F (36.6 C) (Axillary)   Resp (!) 29   Ht 6' (1.829 m)   Wt 88.4 kg (194 lb 14.2 oz)   SpO2 94%   BMI 26.43 kg/m   HEMODYNAMICS:    VENTILATOR  SETTINGS:    INTAKE / OUTPUT: No intake/output data recorded.  PHYSICAL EXAMINATION: General: Chronically ill-appearing, NAD Neuro: Cranial nerves intact, motor/sensory intact HEENT: NCAT, sclerae white Cardiovascular: IR IR, mildly tachycardic, III/VI systolic murmur Lungs: Clear anteriorly without wheezes or other adventitious sounds Abdomen: Distended, diffusely tympanitic, minimally tender, diminished to absent BS Ext: Chronic stasis changes with trace symmetric ankle edema  LABS:  BMET Recent Labs  Lab 12/26/17 0842  NA 133*  K 4.9  CL 97*  CO2 24  BUN 51*  CREATININE 2.66*  GLUCOSE 118*    Electrolytes Recent Labs  Lab 12/26/17 0842  CALCIUM 9.6    CBC Recent Labs  Lab 12/26/17 0842  WBC 20.3*  HGB 15.8  HCT 49.5  PLT 126*    Coag's Recent Labs  Lab 12/26/17 0842  INR 1.31    Sepsis Markers Recent Labs  Lab 12/26/17 0955 12/26/17 1231  LATICACIDVEN 2.2* 2.3*    ABG No results for input(s): PHART, PCO2ART, PO2ART in the last 168 hours.  Liver Enzymes Recent Labs  Lab 12/26/17 0842  AST 28  ALT 20  ALKPHOS 90   BILITOT 1.8*  ALBUMIN 3.4*    Cardiac Enzymes No results for input(s): TROPONINI, PROBNP in the last 168 hours.  Glucose Recent Labs  Lab 12/26/17 1216  GLUCAP 79   Results for orders placed or performed during the hospital encounter of 12/26/17  MRSA PCR Screening     Status: None   Collection Time: 12/26/17 12:18 PM  Result Value Ref Range Status   MRSA by PCR NEGATIVE NEGATIVE Final    Comment:        The GeneXpert MRSA Assay (FDA approved for NASAL specimens only), is one component of a comprehensive MRSA colonization surveillance program. It is not intended to diagnose MRSA infection nor to guide or monitor treatment for MRSA infections. Performed at [facility], [address] [place], [place] [address]     Anti-infectives (From admission, onward)   Start     Dose/Rate Route Frequency Ordered Stop   12/26/17 2000  piperacillin-tazobactam (ZOSYN) IVPB 3.375 g  Status:  Discontinued     3.375 g 12.5 mL/hr over 240 Minutes Intravenous Every 12 hours 12/26/17 1117 12/26/17 1252   12/26/17 1600  piperacillin-tazobactam (ZOSYN) IVPB 3.375 g     3.375 g 12.5 mL/hr over 240 Minutes Intravenous Every 8 hours 12/26/17 1252     12/26/17 0915  piperacillin-tazobactam (ZOSYN) IVPB 3.375 g     3.375 g 100 mL/hr over 30 Minutes Intravenous  Once 12/26/17 0914 12/26/17 1057       Imaging Ct Abdomen Pelvis Wo Contrast  Result Date: 12/26/2017 CLINICAL DATA:  77 year old male with abdominal distention, unable to have bowel movement for 4 days. EXAM: CT ABDOMEN AND PELVIS WITHOUT CONTRAST TECHNIQUE: Multidetector CT imaging of the abdomen and pelvis was performed following the standard protocol without IV contrast. COMPARISON:  CT Abdomen and Pelvis without contrast 07/13/2017 and earlier. FINDINGS: Lower chest: Moderate-sized layering pleural effusions, substantially increased since August when trace pleural fluid was present. Associated compressive lower lobe  atelectasis. No air bronchograms. Underlying emphysema suspected. Stable cardiomegaly. Calcified coronary artery atherosclerosis. No pericardial effusion. Hepatobiliary: Surgically absent gallbladder. Stable and negative noncontrast CT appearance of the liver. Pancreas: Negative. Spleen: Diminutive, negative. Adrenals/Urinary Tract: Normal adrenal glands. The right kidney is normal aside from calcified renal artery atherosclerosis. The right ureter is decompressed to the bladder. The left kidney is hydronephrotic with moderate left pararenal  space stranding and small volume of left pararenal and layering left retroperitoneal fluid. There are multiple bulky calcifications within the left midpole and lower pole renal collecting system (up to 22 millimeters individually). There is a bulky oval obstructing left ureteral calculus measuring 11 millimeters diameter by 18 millimeters in length located about 8 centimeters from the left ureteropelvic junction (coronal image 72). Dilated upstream ureter. Decompressed downstream ureter to the urinary bladder. Mildly to moderately distended but otherwise unremarkable urinary bladder. Stomach/Bowel: Relatively decompressed rectum with no definite rectal abnormality. Redundant sigmoid colon. Gas distended distal sigmoid colon. The junction of the descending and sigmoid colon is herniated along the left inguinal canal. There is diverticulosis of the herniated bowel which contains gas and stool. The herniated loops do not appear incarcerated. Upstream descending colon gas distention is similar to that of the distal sigmoid. Moderately gas distended and redundant transverse colon with relatively little stool. Moderately gas and stool distended right colon and cecum. The cecum measures up to 11 centimeters diameter and is on a lax mesentery, and the terminal ileum appears to be located along the lateral margin of the cecum, with the ileocecal valve suspected on or near image 40 of  series 2. There are gas and fluid-filled loops of small bowel throughout the abdomen and pelvis. Small bowel loops measure 3.5-4 centimeters diameter. The terminal ileum is decompressed containing a small volume of fluid. The stomach is mildly distended with gas. The duodenum is relatively decompressed. No abdominal free air.  No definite abdominal free fluid. Vascular/Lymphatic: Extensive Aortoiliac calcified atherosclerosis. Vascular patency is not evaluated in the absence of IV contrast. 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 No lymphadenopathy is evident. Reproductive: Large bowel containing left inguinal hernia. Otherwise negative. Other: Mild presacral stranding, not definitely increased since 2,018. Musculoskeletal: Stable visualized osseous structures. Chronic SI joint ankylosis. IMPRESSION: 1. Multiple acute abnormalities which could contribute to abdominal pain and distension: 2. Acute Obstructive Uropathy on the left due to a bulky 18 millimeter mid left ureteral calculus. Possible left forniceal rupture. 3. Acute Bowel Obstruction suspected, and appears multifactorial: - Left Inguinal Hernia containing the junction of the descending and sigmoid colon which may be partially obstructing. - partial versus early high-grade Small Bowel Obstruction which may be secondary to compression of the terminal ileum (which has an unusual far lateral position) by the dilated cecum. 4. No pneumoperitoneum. A small volume of free fluid in the left abdomen felt related to # 2. 5. Moderate-sized layering bilateral pleural effusions with lung base atelectasis. 6. Aortic Atherosclerosis (ICD10-I70.0) and Emphysema (ICD10-J43.9). Electronically Signed   By: Odessa Fleming M.D.   On: 12/26/2017 09:52   Dg Chest 2 View  Result Date: 12/26/2017 CLINICAL DATA:  Generalized abdominal pain and distention. EXAM: CHEST  2 VIEW COMPARISON:  Radiograph of November 22, 2017.  FINDINGS: Stable cardiomegaly. No pneumothorax is noted. Mild bibasilar atelectasis and effusions are noted. Bony thorax is unremarkable. IMPRESSION: Mild bibasilar atelectasis and pleural effusions. Electronically Signed   By: Lupita Raider, M.D.   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 due to ureteral stone and bladder outlet obstruction Inguinal hernia with bowel obstruction - hernia now reduced Possible abdominal sepsis DM  2 - presently controlled Very poor candidate for intubation or ACLS  I have reviewed the admission care plan and have little further to add to it  I have requested Palliative care consultation for GOC discussion given his multitude of advanced medical problems  With CAF, h/o DVT and severe pulmonary hypertension, he should be on lifelong anticoagulation if it is felt that this can be administered safely  PCCM service will remain involved as long as he is in the ICU/SDU   Billy Fischeravid Collin Rengel, MD PCCM service Mobile [phone] Pager [phone] 12/26/2017, 2:31 PM

## 2017-12-26 NOTE — Progress Notes (Signed)
Pharmacy Antibiotic Note  Austin [name] is a [age] y.o. male admitted on 12/26/2017 with severe abdominal distention and AKI. Pharmacy has been consulted for Zosyn dosing for possible intraabdominal infection/UTI. Per surgery , patient does have a small bowel obstruction.   Plan: Will transition patient to Zosyn EI 3.375g IV Q8hr.   Height: 6' (182.9 cm) Weight: 194 lb 14.2 oz (88.4 kg) IBW/kg (Calculated) : 77.6  Temp (24hrs), Avg:98.1 F (36.7 C), Min:97.9 F (36.6 C), Max:98.2 F (36.8 C)  Recent Labs  Lab 12/26/17 0842 12/26/17 0955  WBC 20.3*  --   CREATININE 2.66*  --   LATICACIDVEN  --  2.2*    Estimated Creatinine Clearance: 26.3 mL/min (A) (by C-G formula based on SCr of 2.66 mg/dL (H)).    Allergies  Allergen Reactions  . Contrast Media [Iodinated Diagnostic Agents] Nausea And Vomiting  . Other Other (See Comments)    Cannot have "high doses of anesthesia because of his lungs"    Antimicrobials this admission: Zosyn 1/16 >>   Dose adjustments this admission: 1/16 Zosyn transitioned from Q12hr to Q8hr dosing.   Microbiology results: 1/16 BCx: pending  1/16 UCx: pending  1/16 MRSA PCR: negative   Thank you for allowing pharmacy to be a part of this patient's care.  Simpson,Michael L 12/26/2017 12:52 PM

## 2017-12-26 NOTE — Consult Note (Signed)
Surgical Consultation  12/26/2017  Austin [name] is an 77 y.o. male.   Referring Physician: Burlene Arnt  CC: Bowel obstruction  HPI: This is a patient with still significant multiple medical problems.  I was asked see the patient for a bowel obstruction identified on CT scan only.  Patient denies any nausea or vomiting.  He is passing gas but has not had a bowel movement in several days.  He has been admitted to the hospital multiple times for severe CHF and atrial flutter.  He also has known left-sided kidney stones.  He also has a large left inguinal hernia which is been noted previously.  Past Medical History:  Diagnosis Date  . Acute lower UTI 07/16/2017  . Asthma   . Atrial fibrillation ([place])   . CHF (congestive heart failure) ([place])   . Chronic atrial fibrillation ([place])   . Chronic right-sided CHF (congestive heart failure) ([place]) 07/21/2017  . COPD (chronic obstructive pulmonary disease) ([place])   . Diabetes mellitus without complication ([place])   . DVT (deep venous thrombosis) ([place])   . Gastric ulcer   . GERD (gastroesophageal reflux disease)   . Hypercholesteremia   . Hypertension   . Moderate to severe pulmonary hypertension (HCC)--per 2-D echo 07/14/2017 07/15/2017  . PE (pulmonary embolism)   . Sepsis due to undetermined organism with acute respiratory failure ([place]) 07/13/2017    Past Surgical History:  Procedure Laterality Date  . HERNIA REPAIR    . PERIPHERAL VASCULAR CATHETERIZATION N/A 11/03/2015   Procedure: IVC Filter Insertion;  Surgeon: Algernon Huxley, MD;  Location: [facility];  Service: Cardiovascular;  Laterality: N/A;  . PERIPHERAL VASCULAR CATHETERIZATION N/A 07/24/2016   Procedure: IVC Filter Removal;  Surgeon: Algernon Huxley, MD;  Location: [facility];  Service: Cardiovascular;  Laterality: N/A;  . PROSTATE SURGERY      Family History  Problem Relation Age of Onset  . CAD Brother   . Congestive Heart Failure Brother     Social History:   reports that he has been smoking cigarettes.  He has a 45.00 pack-year smoking history. he has never used smokeless tobacco. He reports that he does not drink alcohol or use drugs.  Allergies:  Allergies  Allergen Reactions  . Contrast Media [Iodinated Diagnostic Agents] Nausea And Vomiting  . Other Other (See Comments)    Cannot have "high doses of anesthesia because of his lungs"    Medications reviewed.   Review of Systems:   Review of Systems  Unable to perform ROS: Medical condition  Cannot perform a complete review of systems and that the patient has difficulty completing sentences sitting bolt upright with shortness of breath.  Physical Exam:  BP (!) 134/54 (BP Location: Right Arm)   Pulse 100   Temp 98.2 F (36.8 C) (Oral)   Resp (!) 22   Ht 6' 1"  (1.854 m)   Wt 197 lb (89.4 kg)   SpO2 97%   BMI 25.99 kg/m   Physical Exam  Constitutional: He is oriented to person, place, and time. He appears distressed.  Patient is sitting bolt upright with some shortness of breath.  He has difficulty completing sentences.  He is on supplemental oxygen and appears ready or possibly frankly jaundiced.  HENT:  Head: Normocephalic and atraumatic.  Eyes: Pupils are equal, round, and reactive to light. Right eye exhibits no discharge. Left eye exhibits no discharge. Scleral icterus is present.  Neck: Normal range of motion. JVD present.  Cardiovascular:  Normal rate and normal heart sounds.  Pulmonary/Chest: No stridor. He has wheezes. He has rales.  Abdominal: Soft. He exhibits distension. There is no tenderness. There is no rebound and no guarding.  Considerable abdominal distention with nontender abdomen no guarding rebound or percussion tenderness.  There is a small supraumbilical ventral hernia which is partially reducible and nontender.  There is ecchymosis in multiple places across the abdomen.  Genitourinary: Penis normal.  Genitourinary Comments: Large left inguinal hernia which  is soft and nontender and completely reducible.  As the patient cannot tolerate lying flat he was placed into the head up position but much less so than on first encounter.  The hernia was completely reducible and nontender.  Musculoskeletal: Normal range of motion. He exhibits edema. He exhibits no tenderness.  Lymphadenopathy:    He has no cervical adenopathy.  Neurological: He is alert and oriented to person, place, and time.  Skin: Skin is warm and dry. He is not diaphoretic. No erythema.  Vitals reviewed.     Results for orders placed or performed during the hospital encounter of 12/26/17 (from the past 48 hour(s))  CBC with Differential     Status: Abnormal   Collection Time: 12/26/17  8:42 AM  Result Value Ref Range   WBC 20.3 (H) 3.8 - 10.6 K/uL   RBC 5.37 4.40 - 5.90 MIL/uL   Hemoglobin 15.8 13.0 - 18.0 g/dL   HCT 49.5 40.0 - 52.0 %   MCV 92.2 80.0 - 100.0 fL   MCH 29.4 26.0 - 34.0 pg   MCHC 31.9 (L) 32.0 - 36.0 g/dL   RDW 17.0 (H) 11.5 - 14.5 %   Platelets 126 (L) 150 - 440 K/uL   Neutrophils Relative % 93 %   Neutro Abs 18.9 (H) 1.4 - 6.5 K/uL   Lymphocytes Relative 1 %   Lymphs Abs 0.2 (L) 1.0 - 3.6 K/uL   Monocytes Relative 6 %   Monocytes Absolute 1.2 (H) 0.2 - 1.0 K/uL   Eosinophils Relative 0 %   Eosinophils Absolute 0.0 0 - 0.7 K/uL   Basophils Relative 0 %   Basophils Absolute 0.1 0 - 0.1 K/uL    Comment: Performed at [facility], [address] [place], [place] [address]  Protime-INR     Status: Abnormal   Collection Time: 12/26/17  8:42 AM  Result Value Ref Range   Prothrombin Time 16.2 (H) 11.4 - 15.2 seconds   INR 1.31     Comment: Performed at [facility], [address] [place], [place] [address]  Comprehensive metabolic panel     Status: Abnormal   Collection Time: 12/26/17  8:42 AM  Result Value Ref Range   Sodium 133 (L) 135 - 145 mmol/L   Potassium 4.9 3.5 - 5.1 mmol/L   Chloride 97 (L) 101 - 111 mmol/L   CO2 24 22 -  32 mmol/L   Glucose, Bld 118 (H) 65 - 99 mg/dL   BUN 51 (H) 6 - 20 mg/dL   Creatinine, Ser 2.66 (H) 0.61 - 1.24 mg/dL   Calcium 9.6 8.9 - 10.3 mg/dL   Total Protein 6.2 (L) 6.5 - 8.1 g/dL   Albumin 3.4 (L) 3.5 - 5.0 g/dL   AST 28 15 - 41 U/L   ALT 20 17 - 63 U/L   Alkaline Phosphatase 90 38 - 126 U/L   Total Bilirubin 1.8 (H) 0.3 - 1.2 mg/dL   GFR calc non Af Amer 22 (L) >60 mL/min  GFR calc Af Amer 25 (L) >60 mL/min    Comment: (NOTE) The eGFR has been calculated using the CKD EPI equation. This calculation has not been validated in all clinical situations. eGFR's persistently <60 mL/min signify possible Chronic Kidney Disease.    Anion gap 12 5 - 15    Comment: Performed at [facility], [address] [place], [place] [address]  Lipase, blood     Status: None   Collection Time: 12/26/17  8:42 AM  Result Value Ref Range   Lipase 21 11 - 51 U/L    Comment: Performed at [facility], [address] [place], [place] [address]  Brain natriuretic peptide     Status: Abnormal   Collection Time: 12/26/17  8:42 AM  Result Value Ref Range   B Natriuretic Peptide 618.0 (H) 0.0 - 100.0 pg/mL    Comment: Performed at [facility], [place], [place] [address]   Ct Abdomen Pelvis Wo Contrast  Result Date: 12/26/2017 CLINICAL DATA:  77 year old male with abdominal distention, unable to have bowel movement for 4 days. EXAM: CT ABDOMEN AND PELVIS WITHOUT CONTRAST TECHNIQUE: Multidetector CT imaging of the abdomen and pelvis was performed following the standard protocol without IV contrast. COMPARISON:  CT Abdomen and Pelvis without contrast 07/13/2017 and earlier. FINDINGS: Lower chest: Moderate-sized layering pleural effusions, substantially increased since August when trace pleural fluid was present. Associated compressive lower lobe atelectasis. No air bronchograms. Underlying emphysema suspected. Stable cardiomegaly. Calcified coronary artery  atherosclerosis. No pericardial effusion. Hepatobiliary: Surgically absent gallbladder. Stable and negative noncontrast CT appearance of the liver. Pancreas: Negative. Spleen: Diminutive, negative. Adrenals/Urinary Tract: Normal adrenal glands. The right kidney is normal aside from calcified renal artery atherosclerosis. The right ureter is decompressed to the bladder. The left kidney is hydronephrotic with moderate left pararenal space stranding and small volume of left pararenal and layering left retroperitoneal fluid. There are multiple bulky calcifications within the left midpole and lower pole renal collecting system (up to 22 millimeters individually). There is a bulky oval obstructing left ureteral calculus measuring 11 millimeters diameter by 18 millimeters in length located about 8 centimeters from the left ureteropelvic junction (coronal image 72). Dilated upstream ureter. Decompressed downstream ureter to the urinary bladder. Mildly to moderately distended but otherwise unremarkable urinary bladder. Stomach/Bowel: Relatively decompressed rectum with no definite rectal abnormality. Redundant sigmoid colon. Gas distended distal sigmoid colon. The junction of the descending and sigmoid colon is herniated along the left inguinal canal. There is diverticulosis of the herniated bowel which contains gas and stool. The herniated loops do not appear incarcerated. Upstream descending colon gas distention is similar to that of the distal sigmoid. Moderately gas distended and redundant transverse colon with relatively little stool. Moderately gas and stool distended right colon and cecum. The cecum measures up to 11 centimeters diameter and is on a lax mesentery, and the terminal ileum appears to be located along the lateral margin of the cecum, with the ileocecal valve suspected on or near image 40 of series 2. There are gas and fluid-filled loops of small bowel throughout the abdomen and pelvis. Small bowel loops  measure 3.5-4 centimeters diameter. The terminal ileum is decompressed containing a small volume of fluid. The stomach is mildly distended with gas. The duodenum is relatively decompressed. No abdominal free air.  No definite abdominal free fluid. Vascular/Lymphatic: Extensive Aortoiliac calcified atherosclerosis. Vascular patency is not evaluated in the absence of IV contrast. 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 No lymphadenopathy is evident. Reproductive: Large bowel containing left inguinal hernia. Otherwise negative. Other: Mild presacral stranding, not definitely increased since 2,018. Musculoskeletal: Stable visualized osseous structures. Chronic SI joint ankylosis. IMPRESSION: 1. Multiple acute abnormalities which could contribute to abdominal pain and distension: 2. Acute Obstructive Uropathy on the left due to a bulky 18 millimeter mid left ureteral calculus. Possible left forniceal rupture. 3. Acute Bowel Obstruction suspected, and appears multifactorial: - Left Inguinal Hernia containing the junction of the descending and sigmoid colon which may be partially obstructing. - partial versus early high-grade Small Bowel Obstruction which may be secondary to compression of the terminal ileum (which has an unusual far lateral position) by the dilated cecum. 4. No pneumoperitoneum. A small volume of free fluid in the left abdomen felt related to # 2. 5. Moderate-sized layering bilateral pleural effusions with lung base atelectasis. 6. Aortic Atherosclerosis (ICD10-I70.0) and Emphysema (ICD10-J43.9). Electronically Signed   By: Genevie Ann M.D.   On: 12/26/2017 09:52   Dg Chest 2 View  Result Date: 12/26/2017 CLINICAL DATA:  Generalized abdominal pain and distention. EXAM: CHEST  2 VIEW COMPARISON:  Radiograph of November 22, 2017. FINDINGS: Stable cardiomegaly. No pneumothorax is noted. Mild bibasilar atelectasis and effusions are noted. Bony  thorax is unremarkable. IMPRESSION: Mild bibasilar atelectasis and pleural effusions. Electronically Signed   By: Marijo Conception, M.D.   On: 12/26/2017 09:52    Assessment/Plan:  This a patient with a large number of multiple medical problems which are severe.  He has had recent hospitalizations for CHF.  He has known the left-sided kidney stones and a large left inguinal hernia.  I was asked see the patient for signs on CT scan suggestive of small bowel obstruction and a large left inguinal hernia with colon present.  My personal review of his CT scan I disagree with the read that he does not have a small bowel obstruction but does have a large left inguinal hernia which is easily reducible.  On CT scan that inguinal hernia contains a large amount of colon with hardened stool present but with a large amount of gas as well this was completely reducible it was easily reducible.  I do not see obvious signs of a partial small bowel obstruction but believe that this is related to his large bowel distention which should clear with routine bowel care.  He is not vomiting he is not nauseated.  He could potentially benefit from a nasogastric tube because of the fluid-filled small bowel but since he is not vomiting and I believe that the inguinal hernia which is reducible but has been reduced and routine bowel care could be instituted especially from below he should resolve without any sort of surgical intervention.  This was discussed with Dr. Burlene Arnt I will be following the patient while he is in the hospital as he is likely to be admitted by medicine.  Florene Glen, MD, FACS

## 2017-12-26 NOTE — Progress Notes (Signed)
Pt to specials. 

## 2017-12-26 NOTE — OR Nursing (Signed)
Stage 2 3mm pressure ulcer to top of left ear noted on arrival

## 2017-12-26 NOTE — ED Triage Notes (Addendum)
Pt to ED via [facility] with a complaint of not having a bowel movement for 4 days. ABD is distended.

## 2017-12-26 NOTE — Progress Notes (Signed)
MEDICATION RELATED CONSULT NOTE - INITIAL   Pharmacy Consult for post-IR procedure anticoagulation review  Allergies  Allergen Reactions  . Contrast Media [Iodinated Diagnostic Agents] Nausea And Vomiting  . Other Other (See Comments)    Cannot have "high doses of anesthesia because of his lungs"    Assessment/Plan: Per consult, patient underwent procedure with "standard" bleeding risk. Patient has order for enoxaparin 30 mg subcutaneously daily for DVT prophylaxis on profile. Will retime enoxaparin for tomorrow morning per protocol.  Cindi CarbonMary M Behr Cislo, PharmD, BCPS Clinical Pharmacist 12/26/2017,7:13 PM

## 2017-12-26 NOTE — Progress Notes (Signed)
Inpatient Diabetes Program Recommendations  AACE/ADA: New Consensus Statement on Inpatient Glycemic Control (2015)  Target Ranges:  Prepandial:   less than 140 mg/dL      Peak postprandial:   less than 180 mg/dL (1-2 hours)      Critically ill patients:  140 - 180 mg/dL  Results for Austin [name], [name] (MRN [id number]) as of 12/26/2017 14:40  Ref. Range 12/26/2017 12:16  Glucose-Capillary Latest Ref Range: 65 - 99 mg/dL 79  Results for Austin [name], Austin [name] (MRN [id number]) as of 12/26/2017 14:40  Ref. Range 12/26/2017 08:42  Glucose Latest Ref Range: 65 - 99 mg/dL 272118 (H)   Results for Austin [name], Austin [name] (MRN [id number]) as of 12/26/2017 14:40  Ref. Range 07/16/2017 03:43  Hemoglobin A1C Latest Ref Range: 4.8 - 5.6 % 7.9 (H)   Review of Glycemic Control  Diabetes history: DM2 Outpatient Diabetes medications: Glipizide 5 mg BID, Lantus 10 units daily, Humalog 0-9 units TID with meals Current orders for Inpatient glycemic control: Novolog 0-9 units TID with meals  Inpatient Diabetes Program Recommendations:  Insulin-Correction: Please consider ordering Novolog 0-5 units QHS for bedtime correction scale. A1C: Please consider ordering an A1C to evaluate glycemic control over the past 2-3 months.   NOTE: Noted consult for Diabetes Coordinator. [facility] 118 mg/dl. Patient was on Metformin and Glipizide in the past but at last hospital discharge on 12/13/17, Metformin was stopped, Lantus and Novolog were prescribed, and patient was discharged on steroid taper. Recommend adding Novolog 0-5 units QHS to current inpatient orders for glycemic control and ordering an A1C. Will continue to follow and make further recommendations if needed as more data is collected.  Thanks, Orlando PennerMarie Karin Pinedo, RN, MSN, CDE Diabetes Coordinator Inpatient Diabetes Program [phone] (Team Pager from 8am to 5pm)

## 2017-12-26 NOTE — Progress Notes (Signed)
ANTIBIOTIC CONSULT NOTE - INITIAL  Pharmacy Consult for zosyn Indication: sepsis  Allergies  Allergen Reactions  . Contrast Media [Iodinated Diagnostic Agents] Nausea And Vomiting  . Other Other (See Comments)    Cannot have "high doses of anesthesia because of his lungs"    Patient Measurements: Height: 6\' 1"  (185.4 cm) Weight: 197 lb (89.4 kg) IBW/kg (Calculated) : 79.9 Adjusted Body Weight:   Vital Signs: Temp: 98.2 F (36.8 C) (01/16 0840) Temp Source: Oral (01/16 0840) BP: 134/54 (01/16 0840) Pulse Rate: 100 (01/16 0840) Intake/Output from previous day: No intake/output data recorded. Intake/Output from this shift: No intake/output data recorded.  Labs: Recent Labs    12/26/17 0842  WBC 20.3*  HGB 15.8  PLT 126*  CREATININE 2.66*   Estimated Creatinine Clearance: 27.1 mL/min (A) (by C-G formula based on SCr of 2.66 mg/dL (H)). No results for input(s): VANCOTROUGH, VANCOPEAK, VANCORANDOM, GENTTROUGH, GENTPEAK, GENTRANDOM, TOBRATROUGH, TOBRAPEAK, TOBRARND, AMIKACINPEAK, AMIKACINTROU, AMIKACIN in the last 72 hours.   Microbiology: No results found for this or any previous visit (from the past 720 hour(s)).  Medical History: Past Medical History:  Diagnosis Date  . Acute lower UTI 07/16/2017  . Asthma   . Atrial fibrillation (HCC)   . CHF (congestive heart failure) (HCC)   . Chronic atrial fibrillation (HCC)   . 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 COPD (chronic obstructive pulmonary disease) (HCC)   . Diabetes mellitus without complication (HCC)   . DVT (deep venous thrombosis) (HCC)   . Gastric ulcer   . GERD (gastroesophageal reflux disease)   . Hypercholesteremia   . Hypertension   . Moderate to severe pulmonary hypertension (HCC)--per 2-D echo 07/14/2017 07/15/2017  . PE (pulmonary embolism)   . Sepsis due to undetermined organism with acute respiratory failure (HCC) 07/13/2017    Medications:   (Not in a hospital  admission) Scheduled:  . bisacodyl  10 mg Rectal Once  . metoprolol tartrate  5 mg Intravenous Q6H   Assessment: Pharmacy consulted to dose and monitor zosyn in this 77 year old male admitted severe abdominal distension and pain. Patient currently in AKI.   Goal of Therapy:    Plan:  Will start Zosyn 3.375 g IV q12 hours dosing as if CrCl <10 since patient is in AKI.     Julian,Shivan Hodes D 12/26/2017,11:17 AM

## 2017-12-26 NOTE — Progress Notes (Signed)
Anticoagulation Monitoring:   77 yo male ordered enoxaparin 40mg  SQ Q24hr for DVT prophylaxis. Per protocol for patients with estimated creatinine clearance < 6030mL/min and BMI < 40, will transition patient to enoxaparin 30mg  SQ Q24hr.   Pharmacy will continue to monitor and adjust per consult.   MLS

## 2017-12-26 NOTE — H&P (Signed)
[facility] [place] [facility]   PATIENT NAME: Austin [name]    MR#:  [id number]  DATE OF BIRTH:  04-Mar-1941  DATE OF ADMISSION:  12/26/2017  PRIMARY CARE PHYSICIAN: [facility]   REQUESTING/REFERRING PHYSICIAN: Dr. Alphonzo Lemmings  CHIEF COMPLAINT:   Abdominal pain HISTORY OF PRESENT ILLNESS:  Austin [name]  is a 77 y.o. male with a known history of chronic diastolic heart failure with preserved ejection fraction, chronic hypoxic respiratory failure on 2-3 L of oxygen due to COPD, diabetes, chronic atrial fibrillation who was discharged from the hospital in early January for CHF exacerbation who presents today with abdominal pain. Patient is currently residing at [facility]. Over the past several days he has been complaining of abdominal pain. They performed an x-ray which showed constipation and gas. He has not had a bowel movement for over 4 days. He arrived to the ER with a distended abdomen. CT scan shows possible small bowel obstruction. It also shows obstructive uropathy. ED physician has spoken with surgical consultation as well as urology consultation. Dr. Excell Seltzer did not feel that the patient has a small bowel obstruction. He has a large left inguinal hernia which Dr. Excell Seltzer reduced while the patient was in the emergency room. Urology services will come and evaluate the patient for possible stent placement for the obstructive uropathy.  PAST MEDICAL HISTORY:   Past Medical History:  Diagnosis Date  . Acute lower UTI 07/16/2017  . Asthma   . Atrial fibrillation (HCC)   . CHF (congestive heart failure) (HCC)   . Chronic atrial fibrillation (HCC)   . 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 COPD (chronic obstructive pulmonary disease) (HCC)   . Diabetes mellitus without complication (HCC)   . DVT (deep venous thrombosis) (HCC)   . Gastric ulcer   . GERD (gastroesophageal reflux disease)   .  Hypercholesteremia   . Hypertension   . Moderate to severe pulmonary hypertension (HCC)--per 2-D echo 07/14/2017 07/15/2017  . PE (pulmonary embolism)   . Sepsis due to undetermined organism with acute respiratory failure (HCC) 07/13/2017    PAST SURGICAL HISTORY:   Past Surgical History:  Procedure Laterality Date  . HERNIA REPAIR    . PERIPHERAL VASCULAR CATHETERIZATION N/A 11/03/2015   Procedure: IVC Filter Insertion;  Surgeon: Annice Needy, MD;  Location: [facility] INVASIVE CV LAB;  Service: Cardiovascular;  Laterality: N/A;  . PERIPHERAL VASCULAR CATHETERIZATION N/A 07/24/2016   Procedure: IVC Filter Removal;  Surgeon: Annice Needy, MD;  Location: [facility] INVASIVE CV LAB;  Service: Cardiovascular;  Laterality: N/A;  . PROSTATE SURGERY      SOCIAL HISTORY:   Social History   Tobacco Use  . Smoking status: Current Every Day Smoker    Packs/day: 1.00    Years: 45.00    Pack years: 45.00    Types: Cigarettes  . Smokeless tobacco: Never Used  Substance Use Topics  . Alcohol use: No    FAMILY HISTORY:   Family History  Problem Relation Age of Onset  . CAD Brother   . Congestive Heart Failure Brother     DRUG ALLERGIES:   Allergies  Allergen Reactions  . Contrast Media [Iodinated Diagnostic Agents] Nausea And Vomiting  . Other Other (See Comments)    Cannot have "high doses of anesthesia because of his lungs"    REVIEW OF SYSTEMS:   Review of Systems  Constitutional: Negative.  Negative for chills, fever and malaise/fatigue.  HENT: Negative.  Negative for ear discharge, ear pain, hearing loss, nosebleeds and sore throat.   Eyes: Negative.  Negative for blurred vision and pain.  Respiratory: Positive for shortness of breath. Negative for cough, hemoptysis and wheezing.   Cardiovascular: Positive for leg swelling and PND. Negative for chest pain and palpitations.  Gastrointestinal: Positive for abdominal pain and constipation. Negative for blood in stool, diarrhea, nausea and  vomiting.  Genitourinary: Negative.  Negative for dysuria.  Musculoskeletal: Negative.  Negative for back pain.  Skin: Negative.   Neurological: Negative for dizziness, tremors, speech change, focal weakness, seizures and headaches.  Endo/Heme/Allergies: Negative.  Does not bruise/bleed easily.  Psychiatric/Behavioral: Negative.  Negative for depression, hallucinations and suicidal ideas.    MEDICATIONS AT HOME:   Prior to Admission medications   Medication Sig Start Date End Date Taking? Authorizing Provider  aspirin EC 81 MG tablet Take 81 mg by mouth daily.   Yes [provider]  budesonide-formoterol (SYMBICORT) 160-4.5 MCG/ACT inhaler Inhale 1 puff into the lungs 2 (two) times daily. 07/21/17  Yes Elliot Cousin, MD  digoxin (LANOXIN) 0.125 MG tablet Take 1 tablet (0.125 mg total) by mouth daily. 11/09/17  Yes Delfino Lovett, MD  diltiazem (DILACOR XR) 240 MG 24 hr capsule Take 240 mg by mouth daily.   Yes [provider]  furosemide (LASIX) 40 MG tablet Take 1 tablet (40 mg total) by mouth 2 (two) times daily. 07/21/15  Yes Hackney, Tina A, FNP  glipiZIDE (GLUCOTROL) 5 MG tablet Take 1 tablet (5 mg total) by mouth 2 (two) times daily. DO NOT TAKE THIS DIABETES MEDICINE IF YOUR BLOOD SUGAR IS LESS THAN 130. 07/21/17  Yes Elliot Cousin, MD  guaiFENesin (MUCINEX) 600 MG 12 hr tablet Take by mouth 2 (two) times daily. 12/21/17 12/28/17 Yes [provider]  ipratropium-albuterol (DUONEB) 0.5-2.5 (3) MG/3ML SOLN Take 3 mLs by nebulization every 6 (six) hours. 12/13/17  Yes Gouru, Deanna Artis, MD  lactulose (CHRONULAC) 10 GM/15ML solution Take 10 g by mouth 2 (two) times daily.   Yes [provider]  lovastatin (MEVACOR) 20 MG tablet Take 20 mg by mouth at bedtime. Pt should take with food.    Yes [provider]  nitroGLYCERIN (NITROSTAT) 0.4 MG SL tablet Place 0.4 mg under the tongue every 5 (five) minutes x 3 doses as needed for chest pain. If no relief call 911  or go to emergency room.   Yes [provider]  omeprazole (PRILOSEC) 40 MG capsule Take 40 mg by mouth daily.   Yes [provider]  albuterol (PROAIR HFA) 108 (90 Base) MCG/ACT inhaler Inhale 1-2 puffs into the lungs every 6 (six) hours as needed for wheezing or shortness of breath.    [provider]  guaiFENesin-dextromethorphan (ROBITUSSIN DM) 100-10 MG/5ML syrup Take 10 mLs by mouth every 6 (six) hours as needed for cough. 12/13/17   Gouru, Deanna Artis, MD  insulin aspart (NOVOLOG) 100 UNIT/ML injection Inject 0-5 Units into the skin at bedtime. CBG < 70: implement hypoglycemia protocol CBG 70 - 120: 0 units CBG 121 - 150: 0 units CBG 151 - 200: 0 units CBG 201 - 250: 2 units CBG 251 - 300: 3 units CBG 301 - 350: 4 units CBG 351 - 400: 5 units CBG > 400: call MD and obtain STAT lab verification 12/13/17   Gouru, Deanna Artis, MD  insulin aspart (NOVOLOG) 100 UNIT/ML injection Inject 0-15 Units into the skin 3 (three) times daily with meals. CBG < 70:  implement hypoglycemia protocol CBG 70 - 120: 0 units CBG 121 - 150: 2 units CBG 151 - 200: 3 units CBG 201 - 250: 5 units CBG 251 - 300: 8 units CBG 301 - 350: 11 units CBG 351 - 400: 15 units CBG > 400: call MD and obtain STAT lab verification 12/13/17   Gouru, Deanna Artis, MD  insulin glargine (LANTUS) 100 UNIT/ML injection Inject 0.1 mLs (10 Units total) into the skin daily. 12/13/17   Gouru, Deanna Artis, MD  levalbuterol (XOPENEX) 0.63 MG/3ML nebulizer solution Take 3 mLs (0.63 mg total) by nebulization every 4 (four) hours as needed for wheezing or shortness of breath. 07/21/17 07/21/18  Elliot Cousin, MD  mupirocin cream (BACTROBAN) 2 % Apply topically 2 (two) times daily. 12/13/17   Gouru, Deanna Artis, MD  nicotine (NICODERM CQ - DOSED IN MG/24 HOURS) 14 mg/24hr patch Place 1 patch (14 mg total) onto the skin daily. 12/14/17   Gouru, Deanna Artis, MD  oxyCODONE (OXY IR/ROXICODONE) 5 MG immediate release tablet Take 1 tablet (5 mg total) by mouth every 4  (four) hours as needed for moderate pain. 12/13/17   Gouru, Deanna Artis, MD  polyethylene glycol (MIRALAX) packet Take 17 g by mouth daily as needed for moderate constipation. 08/17/15   Delfino Lovett, MD  predniSONE (STERAPRED UNI-PAK 21 TAB) 10 MG (21) TBPK tablet Take 1 tablet (10 mg total) by mouth daily. Take 6 tablets by mouth for 1 day followed by  5 tablets by mouth for 1 day followed by  4 tablets by mouth for 1 day followed by  3 tablets by mouth for 1 day followed by  2 tablets by mouth for 1 day followed by  1 tablet by mouth for a day and stop Patient not taking: Reported on 12/26/2017 12/13/17   Ramonita Lab, MD      VITAL SIGNS:  Blood pressure (!) 134/54, pulse 100, temperature 98.2 F (36.8 C), temperature source Oral, resp. rate (!) 22, height 6\' 1"  (1.854 m), weight 89.4 kg (197 lb), SpO2 97 %.  PHYSICAL EXAMINATION:   Physical Exam  Constitutional: He is oriented to person, place, and time and well-developed, well-nourished, and in no distress. No distress.  HENT:  Head: Normocephalic.  Eyes: No scleral icterus.  Neck: Normal range of motion. Neck supple. No JVD present. No tracheal deviation present.  Cardiovascular: Normal rate and regular rhythm. Exam reveals no gallop and no friction rub.  Murmur heard. Pulmonary/Chest: Effort normal. No respiratory distress. He has no wheezes. He has no rales. He exhibits no tenderness.  Decreased breath sounds at bases  Abdominal: He exhibits no distension and no mass. There is tenderness. There is no rebound and no guarding.  Severely distended abdomen I could not appreciate bowel sounds  Musculoskeletal: Normal range of motion. He exhibits edema.  Neurological: He is alert and oriented to person, place, and time.  Skin: Skin is warm. No rash noted. No erythema.  Psychiatric: Affect and judgment normal.      LABORATORY PANEL:   CBC Recent Labs  Lab 12/26/17 0842  WBC 20.3*  HGB 15.8  HCT 49.5  PLT 126*    ------------------------------------------------------------------------------------------------------------------  Chemistries  Recent Labs  Lab 12/26/17 0842  NA 133*  K 4.9  CL 97*  CO2 24  GLUCOSE 118*  BUN 51*  CREATININE 2.66*  CALCIUM 9.6  AST 28  ALT 20  ALKPHOS 90  BILITOT 1.8*   ------------------------------------------------------------------------------------------------------------------  Cardiac Enzymes No results for input(s): TROPONINI in the last 168  hours. ------------------------------------------------------------------------------------------------------------------  RADIOLOGY:  Ct Abdomen Pelvis Wo Contrast  Result Date: 12/26/2017 CLINICAL DATA:  77 year old male with abdominal distention, unable to have bowel movement for 4 days. EXAM: CT ABDOMEN AND PELVIS WITHOUT CONTRAST TECHNIQUE: Multidetector CT imaging of the abdomen and pelvis was performed following the standard protocol without IV contrast. COMPARISON:  CT Abdomen and Pelvis without contrast 07/13/2017 and earlier. FINDINGS: Lower chest: Moderate-sized layering pleural effusions, substantially increased since August when trace pleural fluid was present. Associated compressive lower lobe atelectasis. No air bronchograms. Underlying emphysema suspected. Stable cardiomegaly. Calcified coronary artery atherosclerosis. No pericardial effusion. Hepatobiliary: Surgically absent gallbladder. Stable and negative noncontrast CT appearance of the liver. Pancreas: Negative. Spleen: Diminutive, negative. Adrenals/Urinary Tract: Normal adrenal glands. The right kidney is normal aside from calcified renal artery atherosclerosis. The right ureter is decompressed to the bladder. The left kidney is hydronephrotic with moderate left pararenal space stranding and small volume of left pararenal and layering left retroperitoneal fluid. There are multiple bulky calcifications within the left midpole and lower pole renal  collecting system (up to 22 millimeters individually). There is a bulky oval obstructing left ureteral calculus measuring 11 millimeters diameter by 18 millimeters in length located about 8 centimeters from the left ureteropelvic junction (coronal image 72). Dilated upstream ureter. Decompressed downstream ureter to the urinary bladder. Mildly to moderately distended but otherwise unremarkable urinary bladder. Stomach/Bowel: Relatively decompressed rectum with no definite rectal abnormality. Redundant sigmoid colon. Gas distended distal sigmoid colon. The junction of the descending and sigmoid colon is herniated along the left inguinal canal. There is diverticulosis of the herniated bowel which contains gas and stool. The herniated loops do not appear incarcerated. Upstream descending colon gas distention is similar to that of the distal sigmoid. Moderately gas distended and redundant transverse colon with relatively little stool. Moderately gas and stool distended right colon and cecum. The cecum measures up to 11 centimeters diameter and is on a lax mesentery, and the terminal ileum appears to be located along the lateral margin of the cecum, with the ileocecal valve suspected on or near image 40 of series 2. There are gas and fluid-filled loops of small bowel throughout the abdomen and pelvis. Small bowel loops measure 3.5-4 centimeters diameter. The terminal ileum is decompressed containing a small volume of fluid. The stomach is mildly distended with gas. The duodenum is relatively decompressed. No abdominal free air.  No definite abdominal free fluid. Vascular/Lymphatic: Extensive Aortoiliac calcified atherosclerosis. Vascular patency is not evaluated in the absence of IV contrast. 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 No lymphadenopathy is evident. Reproductive: Large bowel containing left inguinal hernia. Otherwise negative. Other: Mild  presacral stranding, not definitely increased since 2,018. Musculoskeletal: Stable visualized osseous structures. Chronic SI joint ankylosis. IMPRESSION: 1. Multiple acute abnormalities which could contribute to abdominal pain and distension: 2. Acute Obstructive Uropathy on the left due to a bulky 18 millimeter mid left ureteral calculus. Possible left forniceal rupture. 3. Acute Bowel Obstruction suspected, and appears multifactorial: - Left Inguinal Hernia containing the junction of the descending and sigmoid colon which may be partially obstructing. - partial versus early high-grade Small Bowel Obstruction which may be secondary to compression of the terminal ileum (which has an unusual far lateral position) by the dilated cecum. 4. No pneumoperitoneum. A small volume of free fluid in the left abdomen felt related to # 2. 5. Moderate-sized layering bilateral pleural effusions with lung base atelectasis. 6. Aortic  Atherosclerosis (ICD10-I70.0) and Emphysema (ICD10-J43.9). Electronically Signed   By: Odessa Fleming M.D.   On: 12/26/2017 09:52   Dg Chest 2 View  Result Date: 12/26/2017 CLINICAL DATA:  Generalized abdominal pain and distention. EXAM: CHEST  2 VIEW COMPARISON:  Radiograph of November 22, 2017. FINDINGS: Stable cardiomegaly. No pneumothorax is noted. Mild bibasilar atelectasis and effusions are noted. Bony thorax is unremarkable. IMPRESSION: Mild bibasilar atelectasis and pleural effusions. Electronically Signed   By: Lupita Raider, M.D.   On: 12/26/2017 09:52    EKG:   Atrial fibrillation with right bundle branch block  IMPRESSION AND PLAN:   77 year old male with chronic diastolic heart failure and preserved ejection fraction, chronic hypoxic respiratory failure due to COPD on 2 L of oxygen and chronic atrial fibrillation who presents with abdominal pain and distention.  1. Severe abdominal distention and abdominal pain without nausea: Patient evaluated by surgical services and felt not  to have small bowel obstruction. He has severe constipation. Start stool softeners May need manual disimpaction KUB ordered for tomorrow Consider NG tube placement Surgery is following  2. Acute kidney injury in the setting of obstructive uropathy Start IV fluids (low dose due to history of CHF) Urology consultation requested and ED physician spoke with Dr. Vanna Scotland Nothing by mouth Continue Zosyn for possible urinary tract infection Further recommendations after urology consult BMP in a.m. Hold nephrotoxic medications Follow-up on UA with urine culture Foley catheter placed and ED.  3. Chronic diastolic heart failure: Although BNP is elevated and patient has lower extremity edema which he reports is at his baseline He does not appear to have CHF exacerbation. His BNP is usually in the 4-600 range. Old Lasix for now due to acute kidney injury Follow intake and output with daily weight  4. Chronic atrial fibrillation: Patient currently nothing by mouth Hold oral medications Start metoprolol 5 mg IV every 6 hours Check digoxin level given elevated creatinine I will hold this medication for now.   5. Chronic hypoxic respiratory failure with chronic oxygen due to COPD No signs of exacerbation at this time  6. Diabetes: Start sliding scale Diabetes nurse consultation requested 7. Tobacco dependence: Patient is encouraged to quit smoking. Counseling was provided for 4 minutes. Continue nicotine patch   Patient is critically ill. Palliative care consultation requested. Case discussed with intensivist    All the records are reviewed and case discussed with ED provider. Management plans discussed with the patient and he in agreement  CODE STATUS: FULL  TOTAL TIME TAKING CARE OF THIS PATIENT: 55 minutes.    Waylynn Benefiel M.D on 12/26/2017 at 11:02 AM  Between 7am to 6pm - Pager - [phone]  After 6pm go to [email or web address] - Social research officer, government  [facility] [place]  [facility]  Office  [phone]  CC: Primary care physician; [facility]

## 2017-12-26 NOTE — ED Provider Notes (Addendum)
[facility] Emergency Department Provider Note  ____________________________________________   I have reviewed the triage vital signs and the nursing notes. Where available I have reviewed prior notes and, if possible and indicated, outside hospital notes.    HISTORY  Chief Complaint Constipation    HPI Benito MccreedyJames Allen Oki is a 77 y.o. male who presents today with "constipation".  Patient has significant multiple medical problems including CHF, pulmonary hypertension, COPD, DVTs in the past, atrial fibrillation, and chronic constipation among other things.  Patient states that his abdomen feels distended and uncomfortable but he has had no vomiting decreased p.o. however for the last 4 days.  He denies fevers or chills.  Denies dysuria urinary frequency.  Has not been eating or drinking no alleviating or aggravating symptoms,   Past Medical History:  Diagnosis Date  . Acute lower UTI 07/16/2017  . Asthma   . Atrial fibrillation (HCC)   . CHF (congestive heart failure) (HCC)   . Chronic atrial fibrillation (HCC)   . 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 COPD (chronic obstructive pulmonary disease) (HCC)   . Diabetes mellitus without complication (HCC)   . DVT (deep venous thrombosis) (HCC)   . Gastric ulcer   . GERD (gastroesophageal reflux disease)   . Hypercholesteremia   . Hypertension   . Moderate to severe pulmonary hypertension (HCC)--per 2-D echo 07/14/2017 07/15/2017  . PE (pulmonary embolism)   . Sepsis due to undetermined organism with acute respiratory failure (HCC) 07/13/2017    Patient Active Problem List   Diagnosis Date Noted  . Atrial fibrillation (HCC) 11/08/2017  . Atrial fibrillation and flutter (HCC) 11/06/2017  . 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 Acute on chronic diastolic CHF (congestive heart failure) (HCC)   . Chronic atrial fibrillation (HCC)   . Acute lower  UTI 07/16/2017  . Sepsis (HCC)   . Acute respiratory failure with hypoxia (HCC) 07/15/2017  . Moderate to severe pulmonary hypertension (HCC)--per 2-D echo 07/14/2017 07/15/2017  . Mass of lung 07/15/2017  . Ascending aortic aneurysm (HCC) 07/14/2017  . Elevated bilirubin 07/14/2017  . Left nephrolithiasis 07/14/2017  . Lactic acidosis 07/14/2017  . Sepsis due to undetermined organism with acute respiratory failure (HCC) 07/13/2017  . COPD exacerbation (HCC) 12/26/2016  . Hypoxia 11/03/2015  . COPD (chronic obstructive pulmonary disease) (HCC) 11/03/2015  . DVT of popliteal vein (HCC) 11/03/2015  . Chronic systolic CHF (congestive heart failure) (HCC) 11/03/2015  . Essential hypertension 11/03/2015  . Diabetes mellitus (HCC) 11/03/2015  . Continuous tobacco abuse 11/03/2015  . Chronic pulmonary embolism (HCC) 11/03/2015  . 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 Hemoptysis 10/31/2015  . Elevated troponin 09/16/2015  . SOB (shortness of breath) 08/24/2015  . Pulmonary embolism (HCC) 08/24/2015  . 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 Type 2 diabetes mellitus (HCC) 08/16/2015  . HLD (hyperlipidemia) 08/16/2015  . GERD (gastroesophageal reflux disease) 08/16/2015  . Chest pain 08/16/2015  . Constipation 08/16/2015  . HTN (hypertension) 07/21/2015  . COPD (chronic obstructive pulmonary disease) with chronic bronchitis (HCC) 07/21/2015  . CHF (NYHA class IV, ACC/AHA stage D) (HCC) 07/04/2015  . CHF (congestive heart failure) (HCC) 07/04/2015    Past Surgical History:  Procedure Laterality Date  . HERNIA REPAIR    . PERIPHERAL VASCULAR CATHETERIZATION N/A 11/03/2015   Procedure: IVC Filter Insertion;  Surgeon: Annice NeedyJason S Dew, MD;  Location: [facility] INVASIVE CV LAB;  Service: Cardiovascular;  Laterality: N/A;  . PERIPHERAL VASCULAR CATHETERIZATION N/A 07/24/2016   Procedure: IVC Filter Removal;  Surgeon: Annice Needy, MD;   Location: [facility] INVASIVE CV LAB;  Service: Cardiovascular;  Laterality: N/A;  . PROSTATE SURGERY      Prior to Admission medications   Medication Sig Start Date End Date Taking? Authorizing Provider  albuterol (PROAIR HFA) 108 (90 Base) MCG/ACT inhaler Inhale 1-2 puffs into the lungs every 6 (six) hours as needed for wheezing or shortness of breath.    [provider]  aspirin EC 81 MG tablet Take 81 mg by mouth daily.    [provider]  budesonide-formoterol (SYMBICORT) 160-4.5 MCG/ACT inhaler Inhale 1 puff into the lungs 2 (two) times daily. 07/21/17   Elliot Cousin, MD  digoxin (LANOXIN) 0.125 MG tablet Take 1 tablet (0.125 mg total) by mouth daily. 11/09/17   Delfino Lovett, MD  diltiazem (DILACOR XR) 240 MG 24 hr capsule Take 240 mg by mouth daily.    [provider]  furosemide (LASIX) 40 MG tablet Take 1 tablet (40 mg total) by mouth 2 (two) times daily. Patient taking differently: Take 40 mg by mouth 3 (three) times daily.  07/21/15   Delma Freeze, FNP  glipiZIDE (GLUCOTROL) 5 MG tablet Take 1 tablet (5 mg total) by mouth 2 (two) times daily. DO NOT TAKE THIS DIABETES MEDICINE IF YOUR BLOOD SUGAR IS LESS THAN 130. 07/21/17   Elliot Cousin, MD  guaiFENesin-dextromethorphan (ROBITUSSIN DM) 100-10 MG/5ML syrup Take 10 mLs by mouth every 6 (six) hours as needed for cough. 12/13/17   Gouru, Deanna Artis, MD  insulin aspart (NOVOLOG) 100 UNIT/ML injection Inject 0-5 Units into the skin at bedtime. CBG < 70: implement hypoglycemia protocol CBG 70 - 120: 0 units CBG 121 - 150: 0 units CBG 151 - 200: 0 units CBG 201 - 250: 2 units CBG 251 - 300: 3 units CBG 301 - 350: 4 units CBG 351 - 400: 5 units CBG > 400: call MD and obtain STAT lab verification 12/13/17   Gouru, Deanna Artis, MD  insulin aspart (NOVOLOG) 100 UNIT/ML injection Inject 0-15 Units into the skin 3 (three) times daily with meals. CBG < 70: implement hypoglycemia protocol CBG 70 - 120: 0 units CBG 121 - 150: 2  units CBG 151 - 200: 3 units CBG 201 - 250: 5 units CBG 251 - 300: 8 units CBG 301 - 350: 11 units CBG 351 - 400: 15 units CBG > 400: call MD and obtain STAT lab verification 12/13/17   Gouru, Deanna Artis, MD  insulin glargine (LANTUS) 100 UNIT/ML injection Inject 0.1 mLs (10 Units total) into the skin daily. 12/13/17   Gouru, Deanna Artis, MD  ipratropium-albuterol (DUONEB) 0.5-2.5 (3) MG/3ML SOLN Take 3 mLs by nebulization every 6 (six) hours. 12/13/17   Gouru, Deanna Artis, MD  levalbuterol (XOPENEX) 0.63 MG/3ML nebulizer solution Take 3 mLs (0.63 mg total) by nebulization every 4 (four) hours as needed for wheezing or shortness of breath. 07/21/17 07/21/18  Elliot Cousin, MD  lovastatin (MEVACOR) 40 MG tablet Take 40 mg by mouth at bedtime. Pt should take with food.     [provider]  mupirocin cream (BACTROBAN) 2 % Apply topically 2 (two) times daily. 12/13/17   Gouru, Deanna Artis, MD  nicotine (NICODERM CQ - DOSED IN MG/24 HOURS) 14 mg/24hr patch Place 1 patch (14 mg total) onto the skin daily. 12/14/17   Gouru, Deanna Artis, MD  nitroGLYCERIN (NITROSTAT) 0.4 MG SL tablet Place 0.4 mg  under the tongue every 5 (five) minutes x 3 doses as needed for chest pain. If no relief call 911 or go to emergency room.    [provider]  omeprazole (PRILOSEC) 40 MG capsule Take 40 mg by mouth daily.    [provider]  oxyCODONE (OXY IR/ROXICODONE) 5 MG immediate release tablet Take 1 tablet (5 mg total) by mouth every 4 (four) hours as needed for moderate pain. 12/13/17   Gouru, Deanna Artis, MD  polyethylene glycol (MIRALAX) packet Take 17 g by mouth daily as needed for moderate constipation. 08/17/15   Delfino Lovett, MD  predniSONE (STERAPRED UNI-PAK 21 TAB) 10 MG (21) TBPK tablet Take 1 tablet (10 mg total) by mouth daily. Take 6 tablets by mouth for 1 day followed by  5 tablets by mouth for 1 day followed by  4 tablets by mouth for 1 day followed by  3 tablets by mouth for 1 day followed by  2 tablets by mouth for 1 day  followed by  1 tablet by mouth for a day and stop 12/13/17   Ramonita Lab, MD    Allergies Contrast media [iodinated diagnostic agents] and Other  Family History  Problem Relation Age of Onset  . CAD Brother   . Congestive Heart Failure Brother     Social History Social History   Tobacco Use  . Smoking status: Current Every Day Smoker    Packs/day: 1.00    Years: 45.00    Pack years: 45.00    Types: Cigarettes  . Smokeless tobacco: Never Used  Substance Use Topics  . Alcohol use: No  . Drug use: No    Review of Systems  Constitutional: No fever/chills Eyes: No visual changes. ENT: No sore throat. No stiff neck no neck pain Cardiovascular: Denies chest pain. Respiratory: Denies shortness of breath. Gastrointestinal:   no vomiting.  No diarrhea.  No constipation. Genitourinary: Negative for dysuria. Musculoskeletal: Negative lower extremity swelling Skin: Negative for rash. Neurological: Negative for severe headaches, focal weakness or numbness.   ____________________________________________   PHYSICAL EXAM:  VITAL SIGNS: ED Triage Vitals  Enc Vitals Group     BP 12/26/17 0840 (!) 134/54     Pulse Rate 12/26/17 0840 100     Resp 12/26/17 0840 (!) 22     Temp 12/26/17 0840 98.2 F (36.8 C)     Temp Source 12/26/17 0840 Oral     SpO2 12/26/17 0840 97 %     Weight 12/26/17 0843 197 lb (89.4 kg)     Height 12/26/17 0843 6\' 1"  (1.854 m)     Head Circumference --      Peak Flow --      Pain Score 12/26/17 0838 10     Pain Loc --      Pain Edu? --      Excl. in GC? --     Constitutional: Alert and oriented chronically ill-appearing Eyes: Conjunctivae are normal Head: Atraumatic HEENT: No congestion/rhinnorhea. Mucous membranes are moist.  Oropharynx non-erythematous Neck:   Nontender with no meningismus, no masses, no stridor Cardiovascular: Normal rate, regular rhythm. Grossly normal heart sounds.  Good peripheral circulation. Respiratory: Normal  respiratory effort.  No retractions. Lungs CTAB. Abdominal: Soft and distended.  Mild diffuse tenderness no guarding no rebound Back:  There is no focal tenderness or step off.  there is no midline tenderness there are no lesions noted. there is no CVA tenderness There is a hernia into the left scrotum non-tender Musculoskeletal:  No lower extremity tenderness, no upper extremity tenderness. No joint effusions, no DVT signs strong distal pulses no edema Neurologic:  Normal speech and language. No gross focal neurologic deficits are appreciated.  Skin:  Skin is warm, dry and intact. No rash noted. Psychiatric: Mood and affect are normal. Speech and behavior are normal.  ____________________________________________   LABS (all labs ordered are listed, but only abnormal results are displayed)  Labs Reviewed  CBC WITH DIFFERENTIAL/PLATELET - Abnormal; Notable for the following components:      Result Value   WBC 20.3 (*)    MCHC 31.9 (*)    RDW 17.0 (*)    Platelets 126 (*)    Neutro Abs 18.9 (*)    Lymphs Abs 0.2 (*)    Monocytes Absolute 1.2 (*)    All other components within normal limits  PROTIME-INR - Abnormal; Notable for the following components:   Prothrombin Time 16.2 (*)    All other components within normal limits  COMPREHENSIVE METABOLIC PANEL - Abnormal; Notable for the following components:   Sodium 133 (*)    Chloride 97 (*)    Glucose, Bld 118 (*)    BUN 51 (*)    Creatinine, Ser 2.66 (*)    Total Protein 6.2 (*)    Albumin 3.4 (*)    Total Bilirubin 1.8 (*)    GFR calc non Af Amer 22 (*)    GFR calc Af Amer 25 (*)    All other components within normal limits  BRAIN NATRIURETIC PEPTIDE - Abnormal; Notable for the following components:   B Natriuretic Peptide 618.0 (*)    All other components within normal limits  CULTURE, BLOOD (ROUTINE X 2)  CULTURE, BLOOD (ROUTINE X 2)  URINE CULTURE  LIPASE, BLOOD  URINALYSIS, COMPLETE (UACMP) WITH MICROSCOPIC  LACTIC  ACID, PLASMA  LACTIC ACID, PLASMA  DIGOXIN LEVEL    Pertinent labs  results that were available during my care of the patient were reviewed by me and considered in my medical decision making (see chart for details). ____________________________________________  EKG  I personally interpreted any EKGs ordered by me or triage Atrial fibrillation right bundle branch block no significant change from prior, rate 100 ____________________________________________  RADIOLOGY  Pertinent labs & imaging results that were available during my care of the patient were reviewed by me and considered in my medical decision making (see chart for details). If possible, patient and/or family made aware of any abnormal findings.  Ct Abdomen Pelvis Wo Contrast  Result Date: 12/26/2017 CLINICAL DATA:  76 year old male with abdominal distention, unable to have bowel movement for 4 days. EXAM: CT ABDOMEN AND PELVIS WITHOUT CONTRAST TECHNIQUE: Multidetector CT imaging of the abdomen and pelvis was performed following the standard protocol without IV contrast. COMPARISON:  CT Abdomen and Pelvis without contrast 07/13/2017 and earlier. FINDINGS: Lower chest: Moderate-sized layering pleural effusions, substantially increased since August when trace pleural fluid was present. Associated compressive lower lobe atelectasis. No air bronchograms. Underlying emphysema suspected. Stable cardiomegaly. Calcified coronary artery atherosclerosis. No pericardial effusion. Hepatobiliary: Surgically absent gallbladder. Stable and negative noncontrast CT appearance of the liver. Pancreas: Negative. Spleen: Diminutive, negative. Adrenals/Urinary Tract: Normal adrenal glands. The right kidney is normal aside from calcified renal artery atherosclerosis. The right ureter is decompressed to the bladder. The left kidney is hydronephrotic with moderate left pararenal space stranding and small volume of left pararenal and layering left  retroperitoneal fluid. There are multiple bulky calcifications within the left midpole and lower pole  renal collecting system (up to 22 millimeters individually). There is a bulky oval obstructing left ureteral calculus measuring 11 millimeters diameter by 18 millimeters in length located about 8 centimeters from the left ureteropelvic junction (coronal image 72). Dilated upstream ureter. Decompressed downstream ureter to the urinary bladder. Mildly to moderately distended but otherwise unremarkable urinary bladder. Stomach/Bowel: Relatively decompressed rectum with no definite rectal abnormality. Redundant sigmoid colon. Gas distended distal sigmoid colon. The junction of the descending and sigmoid colon is herniated along the left inguinal canal. There is diverticulosis of the herniated bowel which contains gas and stool. The herniated loops do not appear incarcerated. Upstream descending colon gas distention is similar to that of the distal sigmoid. Moderately gas distended and redundant transverse colon with relatively little stool. Moderately gas and stool distended right colon and cecum. The cecum measures up to 11 centimeters diameter and is on a lax mesentery, and the terminal ileum appears to be located along the lateral margin of the cecum, with the ileocecal valve suspected on or near image 40 of series 2. There are gas and fluid-filled loops of small bowel throughout the abdomen and pelvis. Small bowel loops measure 3.5-4 centimeters diameter. The terminal ileum is decompressed containing a small volume of fluid. The stomach is mildly distended with gas. The duodenum is relatively decompressed. No abdominal free air.  No definite abdominal free fluid. Vascular/Lymphatic: Extensive Aortoiliac calcified atherosclerosis. Vascular patency is not evaluated in the absence of IV contrast. 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 No lymphadenopathy  is evident. Reproductive: Large bowel containing left inguinal hernia. Otherwise negative. Other: Mild presacral stranding, not definitely increased since 2,018. Musculoskeletal: Stable visualized osseous structures. Chronic SI joint ankylosis. IMPRESSION: 1. Multiple acute abnormalities which could contribute to abdominal pain and distension: 2. Acute Obstructive Uropathy on the left due to a bulky 18 millimeter mid left ureteral calculus. Possible left forniceal rupture. 3. Acute Bowel Obstruction suspected, and appears multifactorial: - Left Inguinal Hernia containing the junction of the descending and sigmoid colon which may be partially obstructing. - partial versus early high-grade Small Bowel Obstruction which may be secondary to compression of the terminal ileum (which has an unusual far lateral position) by the dilated cecum. 4. No pneumoperitoneum. A small volume of free fluid in the left abdomen felt related to # 2. 5. Moderate-sized layering bilateral pleural effusions with lung base atelectasis. 6. Aortic Atherosclerosis (ICD10-I70.0) and Emphysema (ICD10-J43.9). Electronically Signed   By: Odessa Fleming M.D.   On: 12/26/2017 09:52   Dg Chest 2 View  Result Date: 12/26/2017 CLINICAL DATA:  Generalized abdominal pain and distention. EXAM: CHEST  2 VIEW COMPARISON:  Radiograph of November 22, 2017. FINDINGS: Stable cardiomegaly. No pneumothorax is noted. Mild bibasilar atelectasis and effusions are noted. Bony thorax is unremarkable. IMPRESSION: Mild bibasilar atelectasis and pleural effusions. Electronically Signed   By: Lupita Raider, M.D.   On: 12/26/2017 09:52   ____________________________________________    PROCEDURES  Procedure(s) performed: None  Procedures  Critical Care performed: CRITICAL CARE Performed by: Jeanmarie Plant   Total critical care time: 45 minutes  Critical care time was exclusive of separately billable procedures and treating other patients.  Critical care was  necessary to treat or prevent imminent or life-threatening deterioration.  Critical care was time spent personally by me on the following activities: development of treatment plan with patient and/or surrogate as well as nursing, discussions with consultants, evaluation of patient's  response to treatment, examination of patient, obtaining history from patient or surrogate, ordering and performing treatments and interventions, ordering and review of laboratory studies, ordering and review of radiographic studies, pulse oximetry and re-evaluation of patient's condition.   ____________________________________________   INITIAL IMPRESSION / ASSESSMENT AND PLAN / ED COURSE  Pertinent labs & imaging results that were available during my care of the patient were reviewed by me and considered in my medical decision making (see chart for details).  Patient here with constipation symptoms, however white count was 20, he looks generally unwell to me I did give him antibiotics pending CT scan, CT scan shows what is considered to be possible bowel blockage, however, surgery Dr. Excell Seltzer did evaluate him they do not feel that there is a bowel obstruction and they were clinically able to reduce his scrotal hernia, they do not feel that surgery is acutely implicated and they will follow along.  Patient does have an elevated creatinine but likely due to low p.o., however he does have significant diastolic heart failure and will require slow rehydration rather than boluses I think safely.  Pleural effusions are noted but are not contributing to his care today.  I did also discuss with family and son, CODE STATUS at this time is so far full code.  BNP is actually improved from prior admissions for CHF which is at least reassuring in that regard.  Patient will require admission for further care.  We will also discuss with urology.  Given findings on CT scan related to a kidney stone which could certainly be contributing to  his elevated creatinine  ----------------------------------------- 10:34 AM on 12/26/2017 ----------------------------------------- Discussed with Dr. Excell Seltzer who was able to reduce the scrotal hernia does not feel that there are other SBO's despite reading from CT, he thinks that is likely not obstrction.  He does not think that NG tube is required but he will continue to follow closely.  Obviously the patient is very poor surgical risk.   I discussed with Dr. Vanna Scotland of urology Urology will come evaluate the patient he has been unable to give [place] a urine sample, did request an indwelling Foley which we will place I will start the patient again as noted on gentle hydration, we will maintain him n.p.o., appreciate all the consultants help in caring for this patient, family are on the way in,  Admitting to the hospitalist service given multiple different comorbidities and services involved      ____________________________________________   FINAL CLINICAL IMPRESSION(S) / ED DIAGNOSES  Final diagnoses:  None      This chart was dictated using voice recognition software.  Despite best efforts to proofread,  errors can occur which can change meaning.      Jeanmarie Plant, MD 12/26/17 1019    Jeanmarie Plant, MD 12/26/17 1021    Jeanmarie Plant, MD 12/26/17 1035    Jeanmarie Plant, MD 12/27/17 [id number]

## 2017-12-26 NOTE — Progress Notes (Signed)
Pt back from specials.  

## 2017-12-26 NOTE — Procedures (Signed)
  Procedure: Left perc nephrostomy tube under US and fluoro  66F EBL:   minimal Complications:  none immediate  See full dictation in [facility] PACS.  Thora Lance. Enas Winchel MD Main # [phone] Pager  [phone]

## 2017-12-26 NOTE — Consult Note (Signed)
Urology Consult  I have been asked to see the patient by Dr. Alphonzo Lemmings, for evaluation and management of left ureteral calculus.  Chief Complaint: Poor appetite, left flank pain, amongst others  History of Present Illness: Austin [name] is a 77 y.o. year old with multiple medical comorbidities brought in today from rehab after recent hospitalization for CHF and hypoxia with multiple complaints including abdominal pain, distention, left flank pain, poor appetite, shortness of breath, amongst others.  As part of his workup, he underwent CT abdomen pelvis without contrast showing a large 18 mm left mid ureteral calculus with proximal hydroureteronephrosis and perinephric stranding consistent with a possible left forniceal rupture as well as fairly significant nonobstructing left-sided stone burden.  In addition, there are other multiple concerning findings including concerns for possible bowel obstruction and a large left inguinal hernia attaining bowel in his scrotum.  He is been seen and evaluated by general surgery at which time the hernia was easily reducible and not felt to have a surgical abdomen.  He does have a leukocytosis (20.3) and a mildly elevated lactate (2.2).  His UA is fairly benign appearing with 6-30 white blood cells, no red blood cells and no bacteria.  Urine culture is pending.  Creatinine is elevated markedly to 2.66 from baseline approximately 1.5.  He is mildly tachycardic in A. fib.  He is otherwise hemodynamically stable.  He does report that he has been having left flank pain for several days in addition to his abdominal distention.  He does report chills without fever.  He denies any voiding difficulties and is wearing a depends currently.  He is somewhat of a poor historian.  His wife is at bedside who provides additional history today.  He does have a personal history of kidney stones proximal in 20 years ago which he was able to pass spontaneously.  He does not  an established urologist.    Past Medical History:  Diagnosis Date  . Acute lower UTI 07/16/2017  . Asthma   . Atrial fibrillation (HCC)   . CHF (congestive heart failure) (HCC)   . Chronic atrial fibrillation (HCC)   . 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 COPD (chronic obstructive pulmonary disease) (HCC)   . Diabetes mellitus without complication (HCC)   . DVT (deep venous thrombosis) (HCC)   . Gastric ulcer   . GERD (gastroesophageal reflux disease)   . Hypercholesteremia   . Hypertension   . Moderate to severe pulmonary hypertension (HCC)--per 2-D echo 07/14/2017 07/15/2017  . PE (pulmonary embolism)   . Sepsis due to undetermined organism with acute respiratory failure (HCC) 07/13/2017    Past Surgical History:  Procedure Laterality Date  . HERNIA REPAIR    . PERIPHERAL VASCULAR CATHETERIZATION N/A 11/03/2015   Procedure: IVC Filter Insertion;  Surgeon: Annice Needy, MD;  Location: [facility] INVASIVE CV LAB;  Service: Cardiovascular;  Laterality: N/A;  . PERIPHERAL VASCULAR CATHETERIZATION N/A 07/24/2016   Procedure: IVC Filter Removal;  Surgeon: Annice Needy, MD;  Location: [facility] INVASIVE CV LAB;  Service: Cardiovascular;  Laterality: N/A;  . PROSTATE SURGERY      Home Medications:  Current Meds  Medication Sig  . aspirin EC 81 MG tablet Take 81 mg by mouth daily.  . budesonide-formoterol (SYMBICORT) 160-4.5 MCG/ACT inhaler Inhale 1 puff into the lungs 2 (two) times daily.  . digoxin (LANOXIN) 0.125 MG tablet Take 1 tablet (0.125 mg total) by mouth daily.  Marland Kitchen diltiazem (DILACOR  XR) 240 MG 24 hr capsule Take 240 mg by mouth daily.  . furosemide (LASIX) 40 MG tablet Take 1 tablet (40 mg total) by mouth 2 (two) times daily.  Marland Kitchen. glipiZIDE (GLUCOTROL) 5 MG tablet Take 1 tablet (5 mg total) by mouth 2 (two) times daily. DO NOT TAKE THIS DIABETES MEDICINE IF YOUR BLOOD SUGAR IS LESS THAN 130.  Marland Kitchen. guaiFENesin (MUCINEX) 600 MG 12 hr tablet Take by mouth 2 (two)  times daily.  . insulin glargine (LANTUS) 100 UNIT/ML injection Inject 0.1 mLs (10 Units total) into the skin daily.  . insulin lispro (HUMALOG) 100 UNIT/ML injection Inject 0-9 Units into the skin 3 (three) times daily before meals. SSI: 0-99 = O UNITS, 100-149 = 2 UNITS, 150-199 = 3 UNITS, 200-249 = 4 UNITS, 250-299 = 5 UNITS; 300-349 = 6 UNITS 350-399 = 7 UNITS 400-449 = 8 UNITS 450-499 = 9 UNITS, 500 OR > CALL MD  . ipratropium-albuterol (DUONEB) 0.5-2.5 (3) MG/3ML SOLN Take 3 mLs by nebulization every 6 (six) hours.  Marland Kitchen. lactulose (CHRONULAC) 10 GM/15ML solution Take 10 g by mouth 2 (two) times daily.  Marland Kitchen. lovastatin (MEVACOR) 20 MG tablet Take 20 mg by mouth at bedtime. Pt should take with food.   . mupirocin cream (BACTROBAN) 2 % Apply topically 2 (two) times daily.  . nitroGLYCERIN (NITROSTAT) 0.4 MG SL tablet Place 0.4 mg under the tongue every 5 (five) minutes x 3 doses as needed for chest pain. If no relief call 911 or go to emergency room.  Marland Kitchen. omeprazole (PRILOSEC) 40 MG capsule Take 40 mg by mouth daily.    Allergies:  Allergies  Allergen Reactions  . Contrast Media [Iodinated Diagnostic Agents] Nausea And Vomiting  . Other Other (See Comments)    Cannot have "high doses of anesthesia because of his lungs"    Family History  Problem Relation Age of Onset  . CAD Brother   . Congestive Heart Failure Brother     Social History:  reports that he has been smoking cigarettes.  He has a 45.00 pack-year smoking history. he has never used smokeless tobacco. He reports that he does not drink alcohol or use drugs.  ROS: A complete review of systems was performed.  All systems are negative except for pertinent findings as noted.  Physical Exam:  Vital signs in last 24 hours: Temp:  [97.9 F (36.6 C)-98.2 F (36.8 C)] 97.9 F (36.6 C) (01/16 1216) Pulse Rate:  [100-105] 105 (01/16 1132) Resp:  [22-29] 29 (01/16 1216) BP: (116-134)/(54-97) 120/91 (01/16 1216) SpO2:  [92 %-97 %] 94 %  (01/16 1216) Weight:  [194 lb 14.2 oz (88.4 kg)-197 lb (89.4 kg)] 194 lb 14.2 oz (88.4 kg) (01/16 1216) Constitutional: Ill-appearing male, frail, elderly, wearing oxygen.  Extremely poor dentition.  Wife at bedside. HEENT: [place] AT, moist mucus membranes.  Trachea midline, no masses Cardiovascular: + Pitting lower extremity edema with chronic venous stasis changes Respiratory: Tachypneic appearing, mild distress GI: Abdomen nontender, distended, tympanitic GU: Mild left CVA tenderness Neurologic: Grossly intact, no focal deficits, moving all 4 extremities Psychiatric: Normal mood and affect   Laboratory Data:  Recent Labs    12/26/17 0842  WBC 20.3*  HGB 15.8  HCT 49.5   Recent Labs    12/26/17 0842  NA 133*  K 4.9  CL 97*  CO2 24  GLUCOSE 118*  BUN 51*  CREATININE 2.66*  CALCIUM 9.6   Recent Labs    12/26/17 0842  INR 1.31  No results for input(s): LABURIN in the last 72 hours. Results for orders placed or performed during the hospital encounter of 12/26/17  MRSA PCR Screening     Status: None   Collection Time: 12/26/17 12:18 PM  Result Value Ref Range Status   MRSA by PCR NEGATIVE NEGATIVE Final    Comment:        The GeneXpert MRSA Assay (FDA approved for NASAL specimens only), is one component of a comprehensive MRSA colonization surveillance program. It is not intended to diagnose MRSA infection nor to guide or monitor treatment for MRSA infections. Performed at [facility], [address], [place], [place] [address]      Radiologic Imaging: Ct Abdomen Pelvis Wo Contrast  Result Date: 12/26/2017 CLINICAL DATA:  77 year old male with abdominal distention, unable to have bowel movement for 4 days. EXAM: CT ABDOMEN AND PELVIS WITHOUT CONTRAST TECHNIQUE: Multidetector CT imaging of the abdomen and pelvis was performed following the standard protocol without IV contrast. COMPARISON:  CT Abdomen and Pelvis without contrast 07/13/2017 and earlier.  FINDINGS: Lower chest: Moderate-sized layering pleural effusions, substantially increased since August when trace pleural fluid was present. Associated compressive lower lobe atelectasis. No air bronchograms. Underlying emphysema suspected. Stable cardiomegaly. Calcified coronary artery atherosclerosis. No pericardial effusion. Hepatobiliary: Surgically absent gallbladder. Stable and negative noncontrast CT appearance of the liver. Pancreas: Negative. Spleen: Diminutive, negative. Adrenals/Urinary Tract: Normal adrenal glands. The right kidney is normal aside from calcified renal artery atherosclerosis. The right ureter is decompressed to the bladder. The left kidney is hydronephrotic with moderate left pararenal space stranding and small volume of left pararenal and layering left retroperitoneal fluid. There are multiple bulky calcifications within the left midpole and lower pole renal collecting system (up to 22 millimeters individually). There is a bulky oval obstructing left ureteral calculus measuring 11 millimeters diameter by 18 millimeters in length located about 8 centimeters from the left ureteropelvic junction (coronal image 72). Dilated upstream ureter. Decompressed downstream ureter to the urinary bladder. Mildly to moderately distended but otherwise unremarkable urinary bladder. Stomach/Bowel: Relatively decompressed rectum with no definite rectal abnormality. Redundant sigmoid colon. Gas distended distal sigmoid colon. The junction of the descending and sigmoid colon is herniated along the left inguinal canal. There is diverticulosis of the herniated bowel which contains gas and stool. The herniated loops do not appear incarcerated. Upstream descending colon gas distention is similar to that of the distal sigmoid. Moderately gas distended and redundant transverse colon with relatively little stool. Moderately gas and stool distended right colon and cecum. The cecum measures up to 11 centimeters  diameter and is on a lax mesentery, and the terminal ileum appears to be located along the lateral margin of the cecum, with the ileocecal valve suspected on or near image 40 of series 2. There are gas and fluid-filled loops of small bowel throughout the abdomen and pelvis. Small bowel loops measure 3.5-4 centimeters diameter. The terminal ileum is decompressed containing a small volume of fluid. The stomach is mildly distended with gas. The duodenum is relatively decompressed. No abdominal free air.  No definite abdominal free fluid. Vascular/Lymphatic: Extensive Aortoiliac calcified atherosclerosis. Vascular patency is not evaluated in the absence of IV contrast. 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 No lymphadenopathy is evident. Reproductive: Large bowel containing left inguinal hernia. Otherwise negative. Other: Mild presacral stranding, not definitely increased since 2,018. Musculoskeletal: Stable visualized osseous structures. Chronic SI joint ankylosis. IMPRESSION: 1. Multiple acute  abnormalities which could contribute to abdominal pain and distension: 2. Acute Obstructive Uropathy on the left due to a bulky 18 millimeter mid left ureteral calculus. Possible left forniceal rupture. 3. Acute Bowel Obstruction suspected, and appears multifactorial: - Left Inguinal Hernia containing the junction of the descending and sigmoid colon which may be partially obstructing. - partial versus early high-grade Small Bowel Obstruction which may be secondary to compression of the terminal ileum (which has an unusual far lateral position) by the dilated cecum. 4. No pneumoperitoneum. A small volume of free fluid in the left abdomen felt related to # 2. 5. Moderate-sized layering bilateral pleural effusions with lung base atelectasis. 6. Aortic Atherosclerosis (ICD10-I70.0) and Emphysema (ICD10-J43.9). Electronically Signed   By: Odessa Fleming M.D.   On: 12/26/2017  09:52   Dg Chest 2 View  Result Date: 12/26/2017 CLINICAL DATA:  Generalized abdominal pain and distention. EXAM: CHEST  2 VIEW COMPARISON:  Radiograph of November 22, 2017. FINDINGS: Stable cardiomegaly. No pneumothorax is noted. Mild bibasilar atelectasis and effusions are noted. Bony thorax is unremarkable. IMPRESSION: Mild bibasilar atelectasis and pleural effusions. Electronically Signed   By: Lupita Raider, M.D.   On: 12/26/2017 09:52   CT scan was personally reviewed today.  Impression/Assessment:  77 year old male presenting to the emergency room with multiple acute and chronic issues as outlined above.  From urological standpoint, he is a very large 18 mm mid ureteral obstructing stone which appears to be high-grade obstruction.  In addition to this, he does have a leukocytosis and a mild lactate concerning for possible infection which may be GU in origin (although given his multiple issues at this point time, this may be multifactorial).    Case was discussed today with the patient, his wife, as well as Dr. Deanne Coffer of interventional radiology.  I do feel that the patient would be best served with a left percutaneous nephrostomy tube at this time for left urinary decompression in the setting of possible infection, anticipated need for further procedures in the future, and acute on chronic renal failure.  Risk and benefits were discussed in detail.  Plan:  -Plan for urgent left ureteral stent placement today, discussed with Dr. Rica Records who is agreeable this plan -Please keep patient n.p.o. until this time and hold all anticoagulants/antiplatelet therapy -Urology will continue to follow  12/26/2017, 2:59 PM  Vanna Scotland,  MD

## 2017-12-27 ENCOUNTER — Other Ambulatory Visit: Payer: Self-pay

## 2017-12-27 ENCOUNTER — Encounter: Payer: Self-pay | Admitting: Interventional Radiology

## 2017-12-27 ENCOUNTER — Inpatient Hospital Stay: Payer: Medicare Other

## 2017-12-27 DIAGNOSIS — N179 Acute kidney failure, unspecified: Secondary | ICD-10-CM

## 2017-12-27 DIAGNOSIS — N132 Hydronephrosis with renal and ureteral calculous obstruction: Principal | ICD-10-CM

## 2017-12-27 DIAGNOSIS — N200 Calculus of kidney: Secondary | ICD-10-CM

## 2017-12-27 DIAGNOSIS — K567 Ileus, unspecified: Secondary | ICD-10-CM

## 2017-12-27 DIAGNOSIS — K4090 Unilateral inguinal hernia, without obstruction or gangrene, not specified as recurrent: Secondary | ICD-10-CM

## 2017-12-27 LAB — CBC
HCT: 45.9 % (ref 40.0–52.0)
Hemoglobin: 14.4 g/dL (ref 13.0–18.0)
MCH: 29 pg (ref 26.0–34.0)
MCHC: 31.4 g/dL — AB (ref 32.0–36.0)
MCV: 92.2 fL (ref 80.0–100.0)
PLATELETS: 107 10*3/uL — AB (ref 150–440)
RBC: 4.98 MIL/uL (ref 4.40–5.90)
RDW: 16.7 % — AB (ref 11.5–14.5)
WBC: 14.2 10*3/uL — ABNORMAL HIGH (ref 3.8–10.6)

## 2017-12-27 LAB — BASIC METABOLIC PANEL
Anion gap: 13 (ref 5–15)
BUN: 52 mg/dL — AB (ref 6–20)
CALCIUM: 9.4 mg/dL (ref 8.9–10.3)
CO2: 23 mmol/L (ref 22–32)
Chloride: 98 mmol/L — ABNORMAL LOW (ref 101–111)
Creatinine, Ser: 2.39 mg/dL — ABNORMAL HIGH (ref 0.61–1.24)
GFR calc non Af Amer: 25 mL/min — ABNORMAL LOW (ref >60)
GFR, EST AFRICAN AMERICAN: 29 mL/min — AB (ref >60)
GLUCOSE: 54 mg/dL — AB (ref 65–99)
Potassium: 5.1 mmol/L (ref 3.5–5.1)
Sodium: 134 mmol/L — ABNORMAL LOW (ref 135–145)

## 2017-12-27 LAB — GLUCOSE, CAPILLARY
GLUCOSE-CAPILLARY: 181 mg/dL — AB (ref 65–99)
GLUCOSE-CAPILLARY: 216 mg/dL — AB (ref 65–99)
Glucose-Capillary: 101 mg/dL — ABNORMAL HIGH (ref 65–99)
Glucose-Capillary: 78 mg/dL (ref 65–99)
Glucose-Capillary: 62 mg/dL — ABNORMAL LOW (ref 65–99)
Glucose-Capillary: 83 mg/dL (ref 65–99)
Glucose-Capillary: 225 mg/dL — ABNORMAL HIGH (ref 65–99)

## 2017-12-27 LAB — URINE CULTURE: Culture: NO GROWTH

## 2017-12-27 MED ORDER — PANTOPRAZOLE SODIUM 40 MG PO TBEC
40.0000 mg | DELAYED_RELEASE_TABLET | Freq: Two times a day (BID) | ORAL | Status: DC
  Administered 2017-12-27 – 2017-12-29 (×4): 40 mg via ORAL
  Filled 2017-12-27 (×4): qty 1

## 2017-12-27 MED ORDER — ALUM & MAG HYDROXIDE-SIMETH 200-200-20 MG/5ML PO SUSP
30.0000 mL | ORAL | Status: DC | PRN
  Administered 2017-12-27: 30 mL via ORAL
  Filled 2017-12-27 (×2): qty 30

## 2017-12-27 MED ORDER — INSULIN ASPART 100 UNIT/ML ~~LOC~~ SOLN
0.0000 [IU] | Freq: Every day | SUBCUTANEOUS | Status: DC
  Administered 2017-12-27: 2 [IU] via SUBCUTANEOUS
  Filled 2017-12-27: qty 1

## 2017-12-27 MED ORDER — INSULIN ASPART 100 UNIT/ML ~~LOC~~ SOLN
0.0000 [IU] | Freq: Three times a day (TID) | SUBCUTANEOUS | Status: DC
  Administered 2017-12-27 – 2017-12-28 (×3): 2 [IU] via SUBCUTANEOUS
  Administered 2017-12-28 – 2017-12-29 (×2): 1 [IU] via SUBCUTANEOUS
  Filled 2017-12-27 (×5): qty 1

## 2017-12-27 MED ORDER — INSULIN ASPART 100 UNIT/ML ~~LOC~~ SOLN: 0.0000 [IU] | SUBCUTANEOUS | Status: DC

## 2017-12-27 MED ORDER — DEXTROSE 50 % IV SOLN
12.5000 g | Freq: Once | INTRAVENOUS | Status: AC
  Administered 2017-12-27: 12.5 g via INTRAVENOUS
  Filled 2017-12-27: qty 50

## 2017-12-27 NOTE — Progress Notes (Signed)
PULMONARY / CRITICAL CARE MEDICINE   Name: Austin [name] MRN: [id number] DOB: 01-22-1941    ADMISSION DATE:  12/26/2017 PT PROFILE: 40 M SNF resident with multiple chronic medical problems, referred to [facility] ED with abdominal distention, abdominal pain and "constipation".  CTAP revealed obstructive uropathy, left ureteral stone, concern for bowel obstruction, left inguinal hernia.  Seen by general surgery (Dr. Excell Seltzer) who is able to reduce inguinal hernia and did not believe that there was an indication for urgent surgery.  Admitted to SDU due to multiple acute problems and overall frailty.  MAJOR EVENTS/TEST RESULTS: 01/16 Admission as above 01/16 CTAP: Acute Obstructive Uropathy on the left due to a bulky 18 millimeter mid left ureteral calculus. Possible left forniceal rupture. Acute Bowel Obstruction suspected, and appears multifactorial: Left Inguinal Hernia containing the junction of the descending and sigmoid colon which may be partially obstructing 01/16 L perc nephrostomy tube placed  INDWELLING DEVICES:: L perc nephrostomy tube 01/16 >>   MICRO DATA: MRSA PCR 01/16 >>  Urine 01/16 >>   Blood 01/16 >>   ANTIMICROBIALS:  Pip-tazo 01/16 >>    SUBJECTIVE:  NAD. No new complaints. Abdominal pain is resolved  VITAL SIGNS: BP 96/76   Pulse 93   Temp 97.9 F (36.6 C) (Axillary)   Resp (!) 21   Ht 6' (1.829 m)   Wt 88.4 kg (194 lb 14.2 oz)   SpO2 92%   BMI 26.43 kg/m   HEMODYNAMICS:    VENTILATOR SETTINGS:    INTAKE / OUTPUT: I/O last 3 completed shifts: In: 1381.3 [I.V.:1331.3; IV Piggyback:50] Out: 400 [Urine:400]  PHYSICAL EXAMINATION: General: Chronically ill-appearing, NAD Neuro: Cranial nerves intact, motor/sensory intact HEENT: NCAT, sclerae white Cardiovascular: IR IR, III/VI systolic murmur Lungs: Clear anteriorly without wheezes or other adventitious sounds Abdomen: less distended, NT, + BS Ext: Chronic stasis changes with trace symmetric  ankle edema  LABS:  BMET Recent Labs  Lab 12/26/17 0842 12/27/17 0333  NA 133* 134*  K 4.9 5.1  CL 97* 98*  CO2 24 23  BUN 51* 52*  CREATININE 2.66* 2.39*  GLUCOSE 118* 54*    Electrolytes Recent Labs  Lab 12/26/17 0842 12/27/17 0333  CALCIUM 9.6 9.4    CBC Recent Labs  Lab 12/26/17 0842 12/27/17 0333  WBC 20.3* 14.2*  HGB 15.8 14.4  HCT 49.5 45.9  PLT 126* 107*    Coag's Recent Labs  Lab 12/26/17 0842  INR 1.31    Sepsis Markers Recent Labs  Lab 12/26/17 0955 12/26/17 1231  LATICACIDVEN 2.2* 2.3*    ABG No results for input(s): PHART, PCO2ART, PO2ART in the last 168 hours.  Liver Enzymes Recent Labs  Lab 12/26/17 0842  AST 28  ALT 20  ALKPHOS 90  BILITOT 1.8*  ALBUMIN 3.4*    Cardiac Enzymes No results for input(s): TROPONINI, PROBNP in the last 168 hours.  Glucose Recent Labs  Lab 12/26/17 1216 12/27/17 0628 12/27/17 0740 12/27/17 1131  GLUCAP 79 101* 78 62*   Results for orders placed or performed during the hospital encounter of 12/26/17  Culture, blood (routine x 2)     Status: None (Preliminary result)   Collection Time: 12/26/17  9:55 AM  Result Value Ref Range Status   Specimen Description BLOOD LEFT FA  Final   Special Requests   Final    BOTTLES DRAWN AEROBIC AND ANAEROBIC Blood Culture results may not be optimal due to an inadequate volume of blood received in culture bottles  Culture   Final    NO GROWTH < 24 HOURS Performed at [facility], [address] [place], [place] [address]    Report Status PENDING  Incomplete  Culture, blood (routine x 2)     Status: None (Preliminary result)   Collection Time: 12/26/17  9:55 AM  Result Value Ref Range Status   Specimen Description BLOOD LEFT UPPER ARM  Final   Special Requests   Final    BOTTLES DRAWN AEROBIC AND ANAEROBIC Blood Culture adequate volume   Culture   Final    NO GROWTH < 24 HOURS Performed at [facility], [address]  [place], [place] [address]    Report Status PENDING  Incomplete  Urine culture     Status: None   Collection Time: 12/26/17 11:20 AM  Result Value Ref Range Status   Specimen Description   Final    URINE, RANDOM Performed at [facility], [address] [place], [place] [address]    Special Requests   Final    NONE Performed at [facility], [address] [place], [place] [address]    Culture   Final    NO GROWTH Performed at [facility], [address] [place], [place] [address]    Report Status 12/27/2017 FINAL  Final  MRSA PCR Screening     Status: None   Collection Time: 12/26/17 12:18 PM  Result Value Ref Range Status   MRSA by PCR NEGATIVE NEGATIVE Final    Comment:        The GeneXpert MRSA Assay (FDA approved for NASAL specimens only), is one component of a comprehensive MRSA colonization surveillance program. It is not intended to diagnose MRSA infection nor to guide or monitor treatment for MRSA infections. Performed at [facility], [address] [place], [place] [address]     Anti-infectives (From admission, onward)   Start     Dose/Rate Route Frequency Ordered Stop   12/26/17 2000  piperacillin-tazobactam (ZOSYN) IVPB 3.375 g  Status:  Discontinued     3.375 g 12.5 mL/hr over 240 Minutes Intravenous Every 12 hours 12/26/17 1117 12/26/17 1252   12/26/17 1600  piperacillin-tazobactam (ZOSYN) IVPB 3.375 g     3.375 g 12.5 mL/hr over 240 Minutes Intravenous Every 8 hours 12/26/17 1252     12/26/17 0915  piperacillin-tazobactam (ZOSYN) IVPB 3.375 g     3.375 g 100 mL/hr over 30 Minutes Intravenous  Once 12/26/17 0914 12/26/17 1057       CXR: no new CXR     ASSESSMENT / PLAN: Chronic hypoxemic respiratory failure - chronic supplemental O2 @ 4 LPM [place] Small bilateral effusions - likely chronic Chronic pulmonary hypertension History of DVT Chronic atrial fibrillation AKI/CKD - nonoliguric. Cr improving Inguinal  hernia with bowel obstruction - hernia now reduced Concern for sepsis DM 2 - presently controlled Very poor candidate for intubation or ACLS  Cont current Rx Begin diet - full liquids. Advance as tolerated Transfer to MedSurg with cardiac monitoring After transfer, PCCM will sign off. Please call if we can be of further assistance  Billy Fischeravid Sartaj Hoskin, MD PCCM service Mobile [phone] Pager [phone] 12/27/2017, 11:56 AM

## 2017-12-27 NOTE — Progress Notes (Signed)
Pharmacy Antibiotic Note  Austin [name] is a 77 y.o. male admitted on 12/26/2017 with severe abdominal distention and AKI.  Pharmacy has been consulted for Zosyn dosing. Per surgery, patient does have a small bowel obstruction.   Plan: Continue Zosyn EI 3.375g IV q8h.   Antimicrobials this admission: Zosyn 1/16 >>   Microbiology results: 1/17 BCx: no growth < 24 hours 1/16 UCx: no growth 1/16 MRSA PCR: negative  Height: 6' (182.9 cm) Weight: 194 lb 14.2 oz (88.4 kg) IBW/kg (Calculated) : 77.6  Temp (24hrs), Avg:98 F (36.7 C), Min:97.9 F (36.6 C), Max:98.3 F (36.8 C)  Recent Labs  Lab 12/26/17 0842 12/26/17 0955 12/26/17 1231 12/27/17 0333  WBC 20.3*  --   --  14.2*  CREATININE 2.66*  --   --  2.39*  LATICACIDVEN  --  2.2* 2.3*  --     Estimated Creatinine Clearance: 29.3 mL/min (A) (by C-G formula based on SCr of 2.39 mg/dL (H)).    Allergies  Allergen Reactions  . Contrast Media [Iodinated Diagnostic Agents] Nausea And Vomiting  . Other Other (See Comments)    Cannot have "high doses of anesthesia because of his lungs"    Thank you for allowing pharmacy to be a part of this patient's care.  Austin [name] 12/27/2017 9:40 AM

## 2017-12-27 NOTE — Progress Notes (Signed)
Urology Consult Follow Up  Subjective: Felling better today.  Nephrostomy draining well.  + flatus and small BM.    Anti-infectives: Anti-infectives (From admission, onward)   Start     Dose/Rate Route Frequency Ordered Stop   12/26/17 2000  piperacillin-tazobactam (ZOSYN) IVPB 3.375 g  Status:  Discontinued     3.375 g 12.5 mL/hr over 240 Minutes Intravenous Every 12 hours 12/26/17 1117 12/26/17 1252   12/26/17 1600  piperacillin-tazobactam (ZOSYN) IVPB 3.375 g     3.375 g 12.5 mL/hr over 240 Minutes Intravenous Every 8 hours 12/26/17 1252     12/26/17 0915  piperacillin-tazobactam (ZOSYN) IVPB 3.375 g     3.375 g 100 mL/hr over 30 Minutes Intravenous  Once 12/26/17 0914 12/26/17 1057      Current Facility-Administered Medications  Medication Dose Route Frequency Provider Last Rate Last Dose  . 0.9 %  sodium chloride infusion   Intravenous Continuous Adrian Saran, MD 75 mL/hr at 12/27/17 1800    . acetaminophen (TYLENOL) tablet 650 mg  650 mg Oral Q6H PRN Adrian Saran, MD       Or  . acetaminophen (TYLENOL) suppository 650 mg  650 mg Rectal Q6H PRN Adrian Saran, MD   650 mg at 12/27/17 0751  . alum & mag hydroxide-simeth (MAALOX/MYLANTA) 200-200-20 MG/5ML suspension 30 mL  30 mL Oral Q4H PRN Adrian Saran, MD   30 mL at 12/27/17 0509  . bisacodyl (DULCOLAX) EC tablet 5 mg  5 mg Oral Daily PRN Adrian Saran, MD      . docusate sodium (COLACE) capsule 100 mg  100 mg Oral BID Adrian Saran, MD   100 mg at 12/27/17 0958  . enoxaparin (LOVENOX) injection 30 mg  30 mg Subcutaneous Q24H Cindi Carbon, RPH   30 mg at 12/27/17 1610  . hydrALAZINE (APRESOLINE) injection 10 mg  10 mg Intravenous Q6H PRN Mody, Sital, MD      . insulin aspart (novoLOG) injection 0-5 Units  0-5 Units Subcutaneous QHS Billy Fischer B, MD      . insulin aspart (novoLOG) injection 0-9 Units  0-9 Units Subcutaneous TID WC Merwyn Katos, MD   2 Units at 12/27/17 1657  . iohexol (OMNIPAQUE) 300 MG/ML solution 50 mL  50 mL  Intravenous Once PRN Oley Balm, MD      . ipratropium-albuterol (DUONEB) 0.5-2.5 (3) MG/3ML nebulizer solution 3 mL  3 mL Nebulization Q6H PRN Eugenie Norrie, NP   3 mL at 12/26/17 2234  . magnesium citrate solution 1 Bottle  1 Bottle Oral Once PRN Adrian Saran, MD      . MEDLINE mouth rinse  15 mL Mouth Rinse BID Merwyn Katos, MD   15 mL at 12/27/17 1007  . metoprolol tartrate (LOPRESSOR) injection 5 mg  5 mg Intravenous Q6H Mody, Sital, MD   5 mg at 12/27/17 1700  . nicotine (NICODERM CQ - dosed in mg/24 hours) patch 14 mg  14 mg Transdermal Daily Adrian Saran, MD   14 mg at 12/27/17 0958  . ondansetron (ZOFRAN) injection 4 mg  4 mg Intravenous Q6H PRN Mody, Sital, MD      . pantoprazole (PROTONIX) EC tablet 40 mg  40 mg Oral BID AC Houston Siren, MD   40 mg at 12/27/17 1658  . piperacillin-tazobactam (ZOSYN) IVPB 3.375 g  3.375 g Intravenous Q8H Mody, Sital, MD 12.5 mL/hr at 12/27/17 1657 3.375 g at 12/27/17 1657  . polyethylene glycol (MIRALAX / GLYCOLAX) packet 17 g  17 g Oral Daily PRN Mody, Sital, MD      . polyethylene glycol powder (GLYCOLAX/MIRALAX) container 255 g  1 Container Oral Once Adrian Saran, MD      . senna (SENOKOT) tablet 8.6 mg  1 tablet Oral Daily Mody, Sital, MD   8.6 mg at 12/27/17 0958  . sodium chloride flush (NS) 0.9 % injection 5 mL  5 mL Intravenous Q8H Oley Balm, MD   5 mL at 12/27/17 1659  . traMADol (ULTRAM) tablet 50 mg  50 mg Oral Q6H PRN Adrian Saran, MD         Objective: Vital signs in last 24 hours: Temp:  [97.6 F (36.4 C)-98.6 F (37 C)] 98.6 F (37 C) (01/17 1600) Pulse Rate:  [40-109] 79 (01/17 1600) Resp:  [18-32] 24 (01/17 1800) BP: (85-123)/(56-97) 112/91 (01/17 1800) SpO2:  [88 %-96 %] 90 % (01/17 1718)  Intake/Output from previous day: 01/16 0701 - 01/17 0700 In: 1381.3 [I.V.:1331.3; IV Piggyback:50] Out: 400 [Urine:400] Intake/Output this shift: No intake/output data recorded.   Physical Exam  Constitutional:  Ill-appearing male, frail, elderly, wearing oxygen.  Extremely poor dentition.  Wife at bedside with friend.   HEENT: [place] AT, moist mucus membranes.  Trachea midline, no masses GI: Abdomen nontender, distended but improved from yesterday, tympanitic GU: Left nephrostomy tube draining well, clear yellow urine Neurologic: Grossly intact, no focal deficits, moving all 4 extremities Psychiatric: Normal mood and affect  Lab Results:  Recent Labs    12/26/17 0842 12/27/17 0333  WBC 20.3* 14.2*  HGB 15.8 14.4  HCT 49.5 45.9  PLT 126* 107*   BMET Recent Labs    12/26/17 0842 12/27/17 0333  NA 133* 134*  K 4.9 5.1  CL 97* 98*  CO2 24 23  GLUCOSE 118* 54*  BUN 51* 52*  CREATININE 2.66* 2.39*  CALCIUM 9.6 9.4   PT/INR Recent Labs    12/26/17 0842  LABPROT 16.2*  INR 1.31   ABG No results for input(s): PHART, HCO3 in the last 72 hours.  Invalid input(s): PCO2, PO2  Studies/Results: Ct Abdomen Pelvis Wo Contrast  Result Date: 12/26/2017 CLINICAL DATA:  77 year old male with abdominal distention, unable to have bowel movement for 4 days. EXAM: CT ABDOMEN AND PELVIS WITHOUT CONTRAST TECHNIQUE: Multidetector CT imaging of the abdomen and pelvis was performed following the standard protocol without IV contrast. COMPARISON:  CT Abdomen and Pelvis without contrast 07/13/2017 and earlier. FINDINGS: Lower chest: Moderate-sized layering pleural effusions, substantially increased since August when trace pleural fluid was present. Associated compressive lower lobe atelectasis. No air bronchograms. Underlying emphysema suspected. Stable cardiomegaly. Calcified coronary artery atherosclerosis. No pericardial effusion. Hepatobiliary: Surgically absent gallbladder. Stable and negative noncontrast CT appearance of the liver. Pancreas: Negative. Spleen: Diminutive, negative. Adrenals/Urinary Tract: Normal adrenal glands. The right kidney is normal aside from calcified renal artery atherosclerosis. The  right ureter is decompressed to the bladder. The left kidney is hydronephrotic with moderate left pararenal space stranding and small volume of left pararenal and layering left retroperitoneal fluid. There are multiple bulky calcifications within the left midpole and lower pole renal collecting system (up to 22 millimeters individually). There is a bulky oval obstructing left ureteral calculus measuring 11 millimeters diameter by 18 millimeters in length located about 8 centimeters from the left ureteropelvic junction (coronal image 72). Dilated upstream ureter. Decompressed downstream ureter to the urinary bladder. Mildly to moderately distended but otherwise unremarkable urinary bladder. Stomach/Bowel: Relatively decompressed rectum with no definite rectal abnormality. Redundant  sigmoid colon. Gas distended distal sigmoid colon. The junction of the descending and sigmoid colon is herniated along the left inguinal canal. There is diverticulosis of the herniated bowel which contains gas and stool. The herniated loops do not appear incarcerated. Upstream descending colon gas distention is similar to that of the distal sigmoid. Moderately gas distended and redundant transverse colon with relatively little stool. Moderately gas and stool distended right colon and cecum. The cecum measures up to 11 centimeters diameter and is on a lax mesentery, and the terminal ileum appears to be located along the lateral margin of the cecum, with the ileocecal valve suspected on or near image 40 of series 2. There are gas and fluid-filled loops of small bowel throughout the abdomen and pelvis. Small bowel loops measure 3.5-4 centimeters diameter. The terminal ileum is decompressed containing a small volume of fluid. The stomach is mildly distended with gas. The duodenum is relatively decompressed. No abdominal free air.  No definite abdominal free fluid. Vascular/Lymphatic: Extensive Aortoiliac calcified atherosclerosis. Vascular  patency is not evaluated in the absence of IV contrast. 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 No lymphadenopathy is evident. Reproductive: Large bowel containing left inguinal hernia. Otherwise negative. Other: Mild presacral stranding, not definitely increased since 2,018. Musculoskeletal: Stable visualized osseous structures. Chronic SI joint ankylosis. IMPRESSION: 1. Multiple acute abnormalities which could contribute to abdominal pain and distension: 2. Acute Obstructive Uropathy on the left due to a bulky 18 millimeter mid left ureteral calculus. Possible left forniceal rupture. 3. Acute Bowel Obstruction suspected, and appears multifactorial: - Left Inguinal Hernia containing the junction of the descending and sigmoid colon which may be partially obstructing. - partial versus early high-grade Small Bowel Obstruction which may be secondary to compression of the terminal ileum (which has an unusual far lateral position) by the dilated cecum. 4. No pneumoperitoneum. A small volume of free fluid in the left abdomen felt related to # 2. 5. Moderate-sized layering bilateral pleural effusions with lung base atelectasis. 6. Aortic Atherosclerosis (ICD10-I70.0) and Emphysema (ICD10-J43.9). Electronically Signed   By: Odessa FlemingH  Hall M.D.   On: 12/26/2017 09:52   Dg Chest 2 View  Result Date: 12/26/2017 CLINICAL DATA:  Generalized abdominal pain and distention. EXAM: CHEST  2 VIEW COMPARISON:  Radiograph of November 22, 2017. FINDINGS: Stable cardiomegaly. No pneumothorax is noted. Mild bibasilar atelectasis and effusions are noted. Bony thorax is unremarkable. IMPRESSION: Mild bibasilar atelectasis and pleural effusions. Electronically Signed   By: Lupita RaiderJames  Green Jr, M.D.   On: 12/26/2017 09:52   Dg Abd 1 View  Result Date: 12/27/2017 CLINICAL DATA:  Ileus EXAM: ABDOMEN - 1 VIEW COMPARISON:  CT abdomen and pelvis December 26, 2017 FINDINGS: There is now a  nephrostomy catheter on the left. There are multiple calculi in the left kidney, unchanged. There is generalized bowel dilatation without appreciable air-fluid levels. No evident free air. There are multiple foci of arterial vascular calcification. IMPRESSION: Bowel gas pattern consistent with either ileus or a degree of bowel obstruction. No free air evident. Nephrostomy catheter now present on the left. Multiple left renal calculi present. Electronically Signed   By: Bretta BangWilliam  Woodruff III M.D.   On: 12/27/2017 09:13   [place] Intraoperative  Result Date: 12/26/2017 CLINICAL DATA:  Ultrasound was provided for use by the ordering physician, and a technical charge was applied by the performing facility.  No radiologist interpretation/professional services rendered.   Ir Nephrostomy Placement Left  Result  Date: 12/27/2017 CLINICAL DATA:  Left nephrolithiasis and hydronephrosis with possible sepsis or pyonephrosis EXAM: LEFT PERCUTANEOUS NEPHROSTOMY CATHETER PLACEMENT UNDER ULTRASOUND AND FLUOROSCOPIC GUIDANCE FLUOROSCOPY TIME:  seconds TECHNIQUE: The procedure, risks (including but not limited to bleeding, infection, organ damage ), benefits, and alternatives were explained to the patient. Questions regarding the procedure were encouraged and answered. The patient understands and consents to the procedure. Leftflank region prepped with chlorhexidine, draped in usual sterile fashion, infiltrated locally with 1% lidocaine. Patient was already receiving adequate prophylactic antibiotic coverage. Intravenous Fentanyl and Versed were administered as conscious sedation during continuous monitoring of the patient's level of consciousness and physiological / cardiorespiratory status by the radiology RN, with a total moderate sedation time of 30 minutes. Under real-time ultrasound guidance, a 21-gauge trocar needle was advanced into a posterior lower pole calyx. Ultrasound image documentation was saved. Urine spontaneously  returned through the needle. Needle was exchanged over a guidewire for transitional dilator. Contrast injection confirmed appropriate positioning. Multiple filling defects consistent with renal calculi are evident. Catheter was exchanged over a guidewire for a 10 French pigtail catheter, formed centrally within the left renal collecting system. Cloudy urine returned. Contrast injection confirms appropriate positioning and patency. Catheter secured externally with 0 Prolene suture and placed to external drain bag. COMPLICATIONS: COMPLICATIONS none IMPRESSION: 1. Technically successful left percutaneous nephrostomy catheter placement. Electronically Signed   By: Corlis Leak M.D.   On: 12/27/2017 11:22     Assessment/ Plan: 77 yo M with large 18 mm left ureteral stone PPD 1 s/p left nephrostomy tube.  Leukocytosis and flank pain improving.  Cr stable without significant improvement. -plan for Foley removal if Cr improving tomorrow -flush nephrostomy daily -will need outpt f/u to discuss definitive stone management    LOS: 1 day    Vanna Scotland 12/27/2017

## 2017-12-27 NOTE — Progress Notes (Signed)
Inpatient Diabetes Program Recommendations  AACE/ADA: New Consensus Statement on Inpatient Glycemic Control (2015)  Target Ranges:  Prepandial:   less than 140 mg/dL      Peak postprandial:   less than 180 mg/dL (1-2 hours)      Critically ill patients:  140 - 180 mg/dL   Lab Results  Component Value Date   GLUCAP 101 (H) 12/27/2017   HGBA1C 8.4 (H) 12/26/2017    Review of Glycemic Control Results for Austin [name], Austin [name] (MRN [id number]) as of 12/27/2017 07:45  Ref. Range 12/26/2017 12:16 12/27/2017 06:28  Glucose-Capillary Latest Ref Range: 65 - 99 mg/dL 79 409101 (H)    Diabetes history: DM2 Outpatient Diabetes medications: Glipizide 5 mg BID, Lantus 10 units daily, Humalog 0-9 units TID with meals Current orders for Inpatient glycemic control: Novolog 0-9 units TID with meals  Inpatient Diabetes Program Recommendations: Agree with current medications for blood sugar management.    Susette RacerJulie Brealyn Baril, RN, BA, MHA, CDE Diabetes Coordinator Inpatient Diabetes Program  [phone] (Team Pager) [phone] [facility] Office) 12/27/2017 7:48 AM     .

## 2017-12-27 NOTE — Discharge Instructions (Signed)
Heart Failure Clinic appointment on January 04 2018 at 10:40am with Austin Kindredina Jerrick Farve, FNP. Please call [phone] to reschedule.

## 2017-12-27 NOTE — Progress Notes (Signed)
CC: Bowel obstruction Subjective: Patient admitted to the ICU with multiple medical problems including obstructive uropathy.  He has had a nephrostomy tube placed.  He also has a large left inguinal hernia contributing to a partial bowel obstruction versus an ileus.  That hernia is easily reducible.  Family present today.  We discussed his care.  He is passing some gas having no abdominal pain and no nausea or vomiting at this time.  Objective: Vital signs in last 24 hours: Temp:  [97.9 F (36.6 C)-98.3 F (36.8 C)] 97.9 F (36.6 C) (01/17 0800) Pulse Rate:  [40-110] 93 (01/17 0900) Resp:  [18-40] 21 (01/17 0900) BP: (85-157)/(56-99) 96/76 (01/17 0900) SpO2:  [87 %-100 %] 92 % (01/17 0900)    Intake/Output from previous day: 01/16 0701 - 01/17 0700 In: 1381.3 [I.V.:1331.3; IV Piggyback:50] Out: 400 [Urine:400] Intake/Output this shift: Total I/O In: 123.8 [I.V.:123.8] Out: 100 [Urine:100]  Physical exam:  Remains quite distressed and prefers to sit bolt upright.  He appears somewhat short of breath and tachypneic.  Vital signs are reviewed Abdomen is distended minimally tympanitic and completely nontender.  The left inguinal hernia is easily reducible soft and completely nontender.  Lab Results: CBC  Recent Labs    12/26/17 0842 12/27/17 0333  WBC 20.3* 14.2*  HGB 15.8 14.4  HCT 49.5 45.9  PLT 126* 107*   BMET Recent Labs    12/26/17 0842 12/27/17 0333  NA 133* 134*  K 4.9 5.1  CL 97* 98*  CO2 24 23  GLUCOSE 118* 54*  BUN 51* 52*  CREATININE 2.66* 2.39*  CALCIUM 9.6 9.4   PT/INR Recent Labs    12/26/17 0842  LABPROT 16.2*  INR 1.31   ABG No results for input(s): PHART, HCO3 in the last 72 hours.  Invalid input(s): PCO2, PO2  Studies/Results: Ct Abdomen Pelvis Wo Contrast  Result Date: 12/26/2017 CLINICAL DATA:  77 year old male with abdominal distention, unable to have bowel movement for 4 days. EXAM: CT ABDOMEN AND PELVIS WITHOUT CONTRAST  TECHNIQUE: Multidetector CT imaging of the abdomen and pelvis was performed following the standard protocol without IV contrast. COMPARISON:  CT Abdomen and Pelvis without contrast 07/13/2017 and earlier. FINDINGS: Lower chest: Moderate-sized layering pleural effusions, substantially increased since August when trace pleural fluid was present. Associated compressive lower lobe atelectasis. No air bronchograms. Underlying emphysema suspected. Stable cardiomegaly. Calcified coronary artery atherosclerosis. No pericardial effusion. Hepatobiliary: Surgically absent gallbladder. Stable and negative noncontrast CT appearance of the liver. Pancreas: Negative. Spleen: Diminutive, negative. Adrenals/Urinary Tract: Normal adrenal glands. The right kidney is normal aside from calcified renal artery atherosclerosis. The right ureter is decompressed to the bladder. The left kidney is hydronephrotic with moderate left pararenal space stranding and small volume of left pararenal and layering left retroperitoneal fluid. There are multiple bulky calcifications within the left midpole and lower pole renal collecting system (up to 22 millimeters individually). There is a bulky oval obstructing left ureteral calculus measuring 11 millimeters diameter by 18 millimeters in length located about 8 centimeters from the left ureteropelvic junction (coronal image 72). Dilated upstream ureter. Decompressed downstream ureter to the urinary bladder. Mildly to moderately distended but otherwise unremarkable urinary bladder. Stomach/Bowel: Relatively decompressed rectum with no definite rectal abnormality. Redundant sigmoid colon. Gas distended distal sigmoid colon. The junction of the descending and sigmoid colon is herniated along the left inguinal canal. There is diverticulosis of the herniated bowel which contains gas and stool. The herniated loops do not appear incarcerated. Upstream descending colon  gas distention is similar to that of the  distal sigmoid. Moderately gas distended and redundant transverse colon with relatively little stool. Moderately gas and stool distended right colon and cecum. The cecum measures up to 11 centimeters diameter and is on a lax mesentery, and the terminal ileum appears to be located along the lateral margin of the cecum, with the ileocecal valve suspected on or near image 40 of series 2. There are gas and fluid-filled loops of small bowel throughout the abdomen and pelvis. Small bowel loops measure 3.5-4 centimeters diameter. The terminal ileum is decompressed containing a small volume of fluid. The stomach is mildly distended with gas. The duodenum is relatively decompressed. No abdominal free air.  No definite abdominal free fluid. Vascular/Lymphatic: Extensive Aortoiliac calcified atherosclerosis. Vascular patency is not evaluated in the absence of IV contrast. 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 No lymphadenopathy is evident. Reproductive: Large bowel containing left inguinal hernia. Otherwise negative. Other: Mild presacral stranding, not definitely increased since 2,018. Musculoskeletal: Stable visualized osseous structures. Chronic SI joint ankylosis. IMPRESSION: 1. Multiple acute abnormalities which could contribute to abdominal pain and distension: 2. Acute Obstructive Uropathy on the left due to a bulky 18 millimeter mid left ureteral calculus. Possible left forniceal rupture. 3. Acute Bowel Obstruction suspected, and appears multifactorial: - Left Inguinal Hernia containing the junction of the descending and sigmoid colon which may be partially obstructing. - partial versus early high-grade Small Bowel Obstruction which may be secondary to compression of the terminal ileum (which has an unusual far lateral position) by the dilated cecum. 4. No pneumoperitoneum. A small volume of free fluid in the left abdomen felt related to # 2. 5.  Moderate-sized layering bilateral pleural effusions with lung base atelectasis. 6. Aortic Atherosclerosis (ICD10-I70.0) and Emphysema (ICD10-J43.9). Electronically Signed   By: Odessa Fleming M.D.   On: 12/26/2017 09:52   Dg Chest 2 View  Result Date: 12/26/2017 CLINICAL DATA:  Generalized abdominal pain and distention. EXAM: CHEST  2 VIEW COMPARISON:  Radiograph of November 22, 2017. FINDINGS: Stable cardiomegaly. No pneumothorax is noted. Mild bibasilar atelectasis and effusions are noted. Bony thorax is unremarkable. IMPRESSION: Mild bibasilar atelectasis and pleural effusions. Electronically Signed   By: Lupita Raider, M.D.   On: 12/26/2017 09:52   Dg Abd 1 View  Result Date: 12/27/2017 CLINICAL DATA:  Ileus EXAM: ABDOMEN - 1 VIEW COMPARISON:  CT abdomen and pelvis December 26, 2017 FINDINGS: There is now a nephrostomy catheter on the left. There are multiple calculi in the left kidney, unchanged. There is generalized bowel dilatation without appreciable air-fluid levels. No evident free air. There are multiple foci of arterial vascular calcification. IMPRESSION: Bowel gas pattern consistent with either ileus or a degree of bowel obstruction. No free air evident. Nephrostomy catheter now present on the left. Multiple left renal calculi present. Electronically Signed   By: Bretta Bang III M.D.   On: 12/27/2017 09:13   [place] Intraoperative  Result Date: 12/26/2017 CLINICAL DATA:  Ultrasound was provided for use by the ordering physician, and a technical charge was applied by the performing facility.  No radiologist interpretation/professional services rendered.   Ir Nephrostomy Placement Left  Result Date: 12/27/2017 CLINICAL DATA:  Left nephrolithiasis and hydronephrosis with possible sepsis or pyonephrosis EXAM: LEFT PERCUTANEOUS NEPHROSTOMY CATHETER PLACEMENT UNDER ULTRASOUND AND FLUOROSCOPIC GUIDANCE FLUOROSCOPY TIME:  seconds TECHNIQUE: The procedure, risks (including but not limited to bleeding,  infection, organ damage ),  benefits, and alternatives were explained to the patient. Questions regarding the procedure were encouraged and answered. The patient understands and consents to the procedure. Leftflank region prepped with chlorhexidine, draped in usual sterile fashion, infiltrated locally with 1% lidocaine. Patient was already receiving adequate prophylactic antibiotic coverage. Intravenous Fentanyl and Versed were administered as conscious sedation during continuous monitoring of the patient's level of consciousness and physiological / cardiorespiratory status by the radiology RN, with a total moderate sedation time of 30 minutes. Under real-time ultrasound guidance, a 21-gauge trocar needle was advanced into a posterior lower pole calyx. Ultrasound image documentation was saved. Urine spontaneously returned through the needle. Needle was exchanged over a guidewire for transitional dilator. Contrast injection confirmed appropriate positioning. Multiple filling defects consistent with renal calculi are evident. Catheter was exchanged over a guidewire for a 10 French pigtail catheter, formed centrally within the left renal collecting system. Cloudy urine returned. Contrast injection confirms appropriate positioning and patency. Catheter secured externally with 0 Prolene suture and placed to external drain bag. COMPLICATIONS: COMPLICATIONS none IMPRESSION: 1. Technically successful left percutaneous nephrostomy catheter placement. Electronically Signed   By: Corlis Leak  Hassell M.D.   On: 12/27/2017 11:22    Anti-infectives: Anti-infectives (From admission, onward)   Start     Dose/Rate Route Frequency Ordered Stop   12/26/17 2000  piperacillin-tazobactam (ZOSYN) IVPB 3.375 g  Status:  Discontinued     3.375 g 12.5 mL/hr over 240 Minutes Intravenous Every 12 hours 12/26/17 1117 12/26/17 1252   12/26/17 1600  piperacillin-tazobactam (ZOSYN) IVPB 3.375 g     3.375 g 12.5 mL/hr over 240 Minutes Intravenous  Every 8 hours 12/26/17 1252     12/26/17 0915  piperacillin-tazobactam (ZOSYN) IVPB 3.375 g     3.375 g 100 mL/hr over 30 Minutes Intravenous  Once 12/26/17 0914 12/26/17 1057      Assessment/Plan:  Labs and abdominal films are personally reviewed.  I believe that this is likely an ileus although the presence of the colon in the inguinal hernia could be contributing.  His ileus is likely secondary to his obstructive uropathy.  That is being treated by Dr. Apolinar JunesBrandon.  At this time I see no need for surgical intervention as his hernia is easily reducible and nontender.  We will continue to observe.  Lattie Hawichard E Irineo Gaulin, MD, FACS  12/27/2017

## 2017-12-27 NOTE — Progress Notes (Signed)
[employer] [facility] [place] [facility]   PATIENT NAME: Austin [name]    MR#:  [id number]  DATE OF BIRTH:  08-16-1941  SUBJECTIVE:   Patient here due to abdominal pain and noted to have an ileus/partial SBO and also noted to have nephrolithiasis. Patient is status post percutaneous nephrostomy tube placement on the left side. Creatinine stable. Abdominal pain improved since yesterday. Seen by surgery and no acute surgical intervention as patient's inguinal hernia is reducible  REVIEW OF SYSTEMS:    Review of Systems  Constitutional: Negative for chills and fever.  HENT: Negative for congestion and tinnitus.   Eyes: Negative for blurred vision and double vision.  Respiratory: Negative for cough, shortness of breath and wheezing.   Cardiovascular: Negative for chest pain, orthopnea and PND.  Gastrointestinal: Positive for abdominal pain. Negative for diarrhea, nausea and vomiting.  Genitourinary: Negative for dysuria and hematuria.  Neurological: Negative for dizziness, sensory change and focal weakness.  All other systems reviewed and are negative.   Nutrition: Full liquid Tolerating Diet: Yes Tolerating PT: Await Eval.   DRUG ALLERGIES:   Allergies  Allergen Reactions  . Contrast Media [Iodinated Diagnostic Agents] Nausea And Vomiting  . Other Other (See Comments)    Cannot have "high doses of anesthesia because of his lungs"    VITALS:  Blood pressure 119/85, pulse 90, temperature 97.6 F (36.4 C), temperature source Axillary, resp. rate (!) 27, height 6' (1.829 m), weight 88.4 kg (194 lb 14.2 oz), SpO2 (!) 88 %.  PHYSICAL EXAMINATION:   Physical Exam  GENERAL:  77 y.o.-year-old patient lying in bed in no acute distress.  EYES: Pupils equal, round, reactive to light and accommodation. No scleral icterus. Extraocular muscles intact.  HEENT: Head atraumatic, normocephalic. Oropharynx and nasopharynx clear.  NECK:  Supple, no jugular venous distention. No  thyroid enlargement, no tenderness.  LUNGS: Normal breath sounds bilaterally, no wheezing, rales, rhonchi. No use of accessory muscles of respiration.  CARDIOVASCULAR: S1, S2 normal. No murmurs, rubs, or gallops.  ABDOMEN: Soft, nontender, nondistended. Bowel sounds present. No organomegaly or mass.  EXTREMITIES: No cyanosis, clubbing or edema b/l.    NEUROLOGIC: Cranial nerves II through XII are intact. No focal Motor or sensory deficits b/l.  Globally weak PSYCHIATRIC: The patient is alert and oriented x 2.   SKIN: No obvious rash, lesion, or ulcer.   Foley catheter in place with yellow urine draining. Left-sided percutaneous nephrostomy tube in place.  LABORATORY PANEL:   CBC Recent Labs  Lab 12/27/17 0333  WBC 14.2*  HGB 14.4  HCT 45.9  PLT 107*   ------------------------------------------------------------------------------------------------------------------  Chemistries  Recent Labs  Lab 12/26/17 0842 12/27/17 0333  NA 133* 134*  K 4.9 5.1  CL 97* 98*  CO2 24 23  GLUCOSE 118* 54*  BUN 51* 52*  CREATININE 2.66* 2.39*  CALCIUM 9.6 9.4  AST 28  --   ALT 20  --   ALKPHOS 90  --   BILITOT 1.8*  --    ------------------------------------------------------------------------------------------------------------------  Cardiac Enzymes No results for input(s): TROPONINI in the last 168 hours. ------------------------------------------------------------------------------------------------------------------  RADIOLOGY:  Ct Abdomen Pelvis Wo Contrast  Result Date: 12/26/2017 CLINICAL DATA:  77 year old male with abdominal distention, unable to have bowel movement for 4 days. EXAM: CT ABDOMEN AND PELVIS WITHOUT CONTRAST TECHNIQUE: Multidetector CT imaging of the abdomen and pelvis was performed following the standard protocol without IV contrast. COMPARISON:  CT Abdomen and Pelvis without contrast 07/13/2017 and earlier. FINDINGS: Lower chest:  Moderate-sized layering  pleural effusions, substantially increased since August when trace pleural fluid was present. Associated compressive lower lobe atelectasis. No air bronchograms. Underlying emphysema suspected. Stable cardiomegaly. Calcified coronary artery atherosclerosis. No pericardial effusion. Hepatobiliary: Surgically absent gallbladder. Stable and negative noncontrast CT appearance of the liver. Pancreas: Negative. Spleen: Diminutive, negative. Adrenals/Urinary Tract: Normal adrenal glands. The right kidney is normal aside from calcified renal artery atherosclerosis. The right ureter is decompressed to the bladder. The left kidney is hydronephrotic with moderate left pararenal space stranding and small volume of left pararenal and layering left retroperitoneal fluid. There are multiple bulky calcifications within the left midpole and lower pole renal collecting system (up to 22 millimeters individually). There is a bulky oval obstructing left ureteral calculus measuring 11 millimeters diameter by 18 millimeters in length located about 8 centimeters from the left ureteropelvic junction (coronal image 72). Dilated upstream ureter. Decompressed downstream ureter to the urinary bladder. Mildly to moderately distended but otherwise unremarkable urinary bladder. Stomach/Bowel: Relatively decompressed rectum with no definite rectal abnormality. Redundant sigmoid colon. Gas distended distal sigmoid colon. The junction of the descending and sigmoid colon is herniated along the left inguinal canal. There is diverticulosis of the herniated bowel which contains gas and stool. The herniated loops do not appear incarcerated. Upstream descending colon gas distention is similar to that of the distal sigmoid. Moderately gas distended and redundant transverse colon with relatively little stool. Moderately gas and stool distended right colon and cecum. The cecum measures up to 11 centimeters diameter and is on a lax mesentery, and the terminal  ileum appears to be located along the lateral margin of the cecum, with the ileocecal valve suspected on or near image 40 of series 2. There are gas and fluid-filled loops of small bowel throughout the abdomen and pelvis. Small bowel loops measure 3.5-4 centimeters diameter. The terminal ileum is decompressed containing a small volume of fluid. The stomach is mildly distended with gas. The duodenum is relatively decompressed. No abdominal free air.  No definite abdominal free fluid. Vascular/Lymphatic: Extensive Aortoiliac calcified atherosclerosis. Vascular patency is not evaluated in the absence of IV contrast. 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 No lymphadenopathy is evident. Reproductive: Large bowel containing left inguinal hernia. Otherwise negative. Other: Mild presacral stranding, not definitely increased since 2,018. Musculoskeletal: Stable visualized osseous structures. Chronic SI joint ankylosis. IMPRESSION: 1. Multiple acute abnormalities which could contribute to abdominal pain and distension: 2. Acute Obstructive Uropathy on the left due to a bulky 18 millimeter mid left ureteral calculus. Possible left forniceal rupture. 3. Acute Bowel Obstruction suspected, and appears multifactorial: - Left Inguinal Hernia containing the junction of the descending and sigmoid colon which may be partially obstructing. - partial versus early high-grade Small Bowel Obstruction which may be secondary to compression of the terminal ileum (which has an unusual far lateral position) by the dilated cecum. 4. No pneumoperitoneum. A small volume of free fluid in the left abdomen felt related to # 2. 5. Moderate-sized layering bilateral pleural effusions with lung base atelectasis. 6. Aortic Atherosclerosis (ICD10-I70.0) and Emphysema (ICD10-J43.9). Electronically Signed   By: Odessa Fleming M.D.   On: 12/26/2017 09:52   Dg Chest 2 View  Result Date:  12/26/2017 CLINICAL DATA:  Generalized abdominal pain and distention. EXAM: CHEST  2 VIEW COMPARISON:  Radiograph of November 22, 2017. FINDINGS: Stable cardiomegaly. No pneumothorax is noted. Mild bibasilar atelectasis and effusions are noted. Bony thorax is unremarkable. IMPRESSION:  Mild bibasilar atelectasis and pleural effusions. Electronically Signed   By: Lupita Raider, M.D.   On: 12/26/2017 09:52   Dg Abd 1 View  Result Date: 12/27/2017 CLINICAL DATA:  Ileus EXAM: ABDOMEN - 1 VIEW COMPARISON:  CT abdomen and pelvis December 26, 2017 FINDINGS: There is now a nephrostomy catheter on the left. There are multiple calculi in the left kidney, unchanged. There is generalized bowel dilatation without appreciable air-fluid levels. No evident free air. There are multiple foci of arterial vascular calcification. IMPRESSION: Bowel gas pattern consistent with either ileus or a degree of bowel obstruction. No free air evident. Nephrostomy catheter now present on the left. Multiple left renal calculi present. Electronically Signed   By: Bretta Bang III M.D.   On: 12/27/2017 09:13   [place] Intraoperative  Result Date: 12/26/2017 CLINICAL DATA:  Ultrasound was provided for use by the ordering physician, and a technical charge was applied by the performing facility.  No radiologist interpretation/professional services rendered.   Ir Nephrostomy Placement Left  Result Date: 12/27/2017 CLINICAL DATA:  Left nephrolithiasis and hydronephrosis with possible sepsis or pyonephrosis EXAM: LEFT PERCUTANEOUS NEPHROSTOMY CATHETER PLACEMENT UNDER ULTRASOUND AND FLUOROSCOPIC GUIDANCE FLUOROSCOPY TIME:  seconds TECHNIQUE: The procedure, risks (including but not limited to bleeding, infection, organ damage ), benefits, and alternatives were explained to the patient. Questions regarding the procedure were encouraged and answered. The patient understands and consents to the procedure. Leftflank region prepped with chlorhexidine,  draped in usual sterile fashion, infiltrated locally with 1% lidocaine. Patient was already receiving adequate prophylactic antibiotic coverage. Intravenous Fentanyl and Versed were administered as conscious sedation during continuous monitoring of the patient's level of consciousness and physiological / cardiorespiratory status by the radiology RN, with a total moderate sedation time of 30 minutes. Under real-time ultrasound guidance, a 21-gauge trocar needle was advanced into a posterior lower pole calyx. Ultrasound image documentation was saved. Urine spontaneously returned through the needle. Needle was exchanged over a guidewire for transitional dilator. Contrast injection confirmed appropriate positioning. Multiple filling defects consistent with renal calculi are evident. Catheter was exchanged over a guidewire for a 10 French pigtail catheter, formed centrally within the left renal collecting system. Cloudy urine returned. Contrast injection confirms appropriate positioning and patency. Catheter secured externally with 0 Prolene suture and placed to external drain bag. COMPLICATIONS: COMPLICATIONS none IMPRESSION: 1. Technically successful left percutaneous nephrostomy catheter placement. Electronically Signed   By: Corlis Leak M.D.   On: 12/27/2017 11:22     ASSESSMENT AND PLAN:   76 year old male with past medical history of chronic atrial fibrillation, CHF, COPD, diabetes, hypertension, hyperlipidemia, history of pulmonary embolism who presented to the hospital due to abdominal pain and noted to have partial small bowel obstruction secondary to inguinal hernia and also noted to have nephrolithiasis.  1. Obstructive nephrolithiasis-patient noted to have a large left-sided ureteral stone. Seen by urology and given his comorbidities they recommended IR guided percutaneous nephrostomy tube which was done yesterday. -Creatinine stable, patient's abdominal pain is improved. Continue empiric  Zosyn. -Continue further care as per urology/interventional radiology.  2. Partial small bowel obstruction-this was secondary to a inguinal hernia. As per surgery this was easily reducible. They think the patient's ileus is secondary to the nephrolithiasis and not likely the hernia. No acute surgical indication continue supportive care for now.  3. Acute on chronic renal failure-secondary to obstructive uropathy. -Status post percutaneous nephrostomy tube on the left. Creatinine is trending down. Continue IV fluids, renal dose meds, avoid nephrotoxins,  follow urine output.  4. Essential hypertension-continue IV hydralazine, Metoprolol as needed.  5. Diabetes type 2 without complication-continue sliding scale insulin.  6. GERD-continue Protonix.  7. Tobacco abuse-continue nicotine patch.  All the records are reviewed and case discussed with Care Management/Social Worker. Management plans discussed with the patient, family and they are in agreement.  CODE STATUS: Full code  DVT Prophylaxis: Lovenox  TOTAL TIME TAKING CARE OF THIS PATIENT: 30 minutes.   POSSIBLE D/C IN 2-3 DAYS, DEPENDING ON CLINICAL CONDITION.   [place] SirenSAINANI,Nijee Heatwole J M.D on 12/27/2017 at 3:29 PM  Between 7am to 6pm - Pager - [phone]  After 6pm go to [email or web address] - Scientist, research (life sciences)password [email or web address]  [facility] [place] [facility]  Office  [phone]  CC: Primary care physician; [facility]

## 2017-12-27 NOTE — Consult Note (Signed)
Date: 12/27/2017                  Patient Name:  Austin [name]  MRN: [id number]  DOB: 03-17-1941  Age / Sex: 77 y.o., male         PCP: [facility]                 Service Requesting Consult: IM/ Houston Siren, MD                 Reason for Consult: ARF            History of Present Illness: Patient is a 77 y.o. male with medical problems of atrial fibrillation, congestive heart failure, COPD, diabetes, DVT, severe pulmonary hypertension, history of kidney stones, who was admitted to [facility] on 12/26/2017 for evaluation of weakness, fatigue and decreased appetite 2-3 days prior to admission.   Workup so far includes a CT scan of the abdomen which shows acute obstructive uropathy on the left due to 18 mm  left ureteral calculus.  Acute bowel obstruction and a significant left inguinal hernia was also found.  Patient has bilateral moderate size pleural effusions, aortic atherosclerosis and emphysema  He underwent emergent left percutaneous venous nephrostomy tube on January 16.  His baseline creatinine appears to be 1.21 from January 3.  At the time of admission creatinine was.2.66 After replacement of nephrostomy tube, his creatinine has improved to 2.39.  Medications: Outpatient medications: Medications Prior to Admission  Medication Sig Dispense Refill Last Dose  . aspirin EC 81 MG tablet Take 81 mg by mouth daily.   12/25/2017 at 0800  . budesonide-formoterol (SYMBICORT) 160-4.5 MCG/ACT inhaler Inhale 1 puff into the lungs 2 (two) times daily. 1 Inhaler 12 12/25/2017 at 1800  . digoxin (LANOXIN) 0.125 MG tablet Take 1 tablet (0.125 mg total) by mouth daily. 30 tablet 0 12/25/2017 at 0800  . diltiazem (DILACOR XR) 240 MG 24 hr capsule Take 240 mg by mouth daily.   12/25/2017 at 0800  . furosemide (LASIX) 40 MG tablet Take 1 tablet (40 mg total) by mouth 2 (two) times daily. 60 tablet 5 12/25/2017 at 1800  . glipiZIDE (GLUCOTROL) 5 MG tablet Take 1 tablet  (5 mg total) by mouth 2 (two) times daily. DO NOT TAKE THIS DIABETES MEDICINE IF YOUR BLOOD SUGAR IS LESS THAN 130.   12/25/2017 at 1800  . guaiFENesin (MUCINEX) 600 MG 12 hr tablet Take by mouth 2 (two) times daily.   12/25/2017 at 1800  . insulin glargine (LANTUS) 100 UNIT/ML injection Inject 0.1 mLs (10 Units total) into the skin daily. 10 mL 11 12/25/2017 at 2000  . insulin lispro (HUMALOG) 100 UNIT/ML injection Inject 0-9 Units into the skin 3 (three) times daily before meals. SSI: 0-99 = O UNITS, 100-149 = 2 UNITS, 150-199 = 3 UNITS, 200-249 = 4 UNITS, 250-299 = 5 UNITS; 300-349 = 6 UNITS 350-399 = 7 UNITS 400-449 = 8 UNITS 450-499 = 9 UNITS, 500 OR > CALL MD   PRN at PRN  . ipratropium-albuterol (DUONEB) 0.5-2.5 (3) MG/3ML SOLN Take 3 mLs by nebulization every 6 (six) hours. 360 mL  12/25/2017 at 0800  . lactulose (CHRONULAC) 10 GM/15ML solution Take 10 g by mouth 2 (two) times daily.   12/25/2017 at 1800  . lovastatin (MEVACOR) 20 MG tablet Take 20 mg by mouth at bedtime. Pt should take with food.    12/25/2017 at 2000  . mupirocin  cream (BACTROBAN) 2 % Apply topically 2 (two) times daily. 15 g 0 12/25/2017 at 1800  . nitroGLYCERIN (NITROSTAT) 0.4 MG SL tablet Place 0.4 mg under the tongue every 5 (five) minutes x 3 doses as needed for chest pain. If no relief call 911 or go to emergency room.   PRN at PRN  . omeprazole (PRILOSEC) 40 MG capsule Take 40 mg by mouth daily.   12/25/2017 at 0800  . albuterol (PROAIR HFA) 108 (90 Base) MCG/ACT inhaler Inhale 1-2 puffs into the lungs every 6 (six) hours as needed for wheezing or shortness of breath.   prn at prn  . guaiFENesin-dextromethorphan (ROBITUSSIN DM) 100-10 MG/5ML syrup Take 10 mLs by mouth every 6 (six) hours as needed for cough. (Patient not taking: Reported on 12/26/2017) 118 mL 0 Not Taking at Unknown time  . insulin aspart (NOVOLOG) 100 UNIT/ML injection Inject 0-5 Units into the skin at bedtime. CBG < 70: implement hypoglycemia protocol CBG 70 -  120: 0 units CBG 121 - 150: 0 units CBG 151 - 200: 0 units CBG 201 - 250: 2 units CBG 251 - 300: 3 units CBG 301 - 350: 4 units CBG 351 - 400: 5 units CBG > 400: call MD and obtain STAT lab verification (Patient not taking: Reported on 12/26/2017) 10 mL 11 Not Taking  . insulin aspart (NOVOLOG) 100 UNIT/ML injection Inject 0-15 Units into the skin 3 (three) times daily with meals. CBG < 70: implement hypoglycemia protocol CBG 70 - 120: 0 units CBG 121 - 150: 2 units CBG 151 - 200: 3 units CBG 201 - 250: 5 units CBG 251 - 300: 8 units CBG 301 - 350: 11 units CBG 351 - 400: 15 units CBG > 400: call MD and obtain STAT lab verification (Patient not taking: Reported on 12/26/2017) 10 mL 11 Not Taking at Unknown time  . levalbuterol (XOPENEX) 0.63 MG/3ML nebulizer solution Take 3 mLs (0.63 mg total) by nebulization every 4 (four) hours as needed for wheezing or shortness of breath. 3 mL 2 prn at prn  . nicotine (NICODERM CQ - DOSED IN MG/24 HOURS) 14 mg/24hr patch Place 1 patch (14 mg total) onto the skin daily. (Patient not taking: Reported on 12/26/2017) 28 patch 0 Not Taking at Unknown time  . oxyCODONE (OXY IR/ROXICODONE) 5 MG immediate release tablet Take 1 tablet (5 mg total) by mouth every 4 (four) hours as needed for moderate pain. (Patient not taking: Reported on 12/26/2017) 30 tablet 0 Not Taking at Unknown time  . polyethylene glycol (MIRALAX) packet Take 17 g by mouth daily as needed for moderate constipation. 30 each 0 prn at prn  . predniSONE (STERAPRED UNI-PAK 21 TAB) 10 MG (21) TBPK tablet Take 1 tablet (10 mg total) by mouth daily. Take 6 tablets by mouth for 1 day followed by  5 tablets by mouth for 1 day followed by  4 tablets by mouth for 1 day followed by  3 tablets by mouth for 1 day followed by  2 tablets by mouth for 1 day followed by  1 tablet by mouth for a day and stop (Patient not taking: Reported on 12/26/2017) 21 tablet 0 Not Taking at --    Current  medications: Current Facility-Administered Medications  Medication Dose Route Frequency Provider Last Rate Last Dose  . 0.9 %  sodium chloride infusion   Intravenous Continuous Adrian Saran, MD 75 mL/hr at 12/27/17 1400    . acetaminophen (TYLENOL) tablet 650 mg  650  mg Oral Q6H PRN Adrian SaranMody, Sital, MD       Or  . acetaminophen (TYLENOL) suppository 650 mg  650 mg Rectal Q6H PRN Adrian SaranMody, Sital, MD   650 mg at 12/27/17 0751  . alum & mag hydroxide-simeth (MAALOX/MYLANTA) 200-200-20 MG/5ML suspension 30 mL  30 mL Oral Q4H PRN Adrian SaranMody, Sital, MD   30 mL at 12/27/17 0509  . bisacodyl (DULCOLAX) EC tablet 5 mg  5 mg Oral Daily PRN Adrian SaranMody, Sital, MD      . docusate sodium (COLACE) capsule 100 mg  100 mg Oral BID Adrian SaranMody, Sital, MD   100 mg at 12/27/17 0958  . enoxaparin (LOVENOX) injection 30 mg  30 mg Subcutaneous Q24H Cindi CarbonSwayne, Mary M, RPH   30 mg at 12/27/17 [id number]  . hydrALAZINE (APRESOLINE) injection 10 mg  10 mg Intravenous Q6H PRN Mody, Sital, MD      . insulin aspart (novoLOG) injection 0-5 Units  0-5 Units Subcutaneous QHS Billy FischerSimonds, David B, MD      . insulin aspart (novoLOG) injection 0-9 Units  0-9 Units Subcutaneous TID WC Merwyn KatosSimonds, David B, MD      . iohexol (OMNIPAQUE) 300 MG/ML solution 50 mL  50 mL Intravenous Once PRN Oley BalmHassell, Daniel, MD      . ipratropium-albuterol (DUONEB) 0.5-2.5 (3) MG/3ML nebulizer solution 3 mL  3 mL Nebulization Q6H PRN Eugenie NorrieBlakeney, Dana G, NP   3 mL at 12/26/17 2234  . magnesium citrate solution 1 Bottle  1 Bottle Oral Once PRN Adrian SaranMody, Sital, MD      . MEDLINE mouth rinse  15 mL Mouth Rinse BID Merwyn KatosSimonds, David B, MD   15 mL at 12/27/17 1007  . metoprolol tartrate (LOPRESSOR) injection 5 mg  5 mg Intravenous Q6H Mody, Sital, MD   5 mg at 12/27/17 1300  . nicotine (NICODERM CQ - dosed in mg/24 hours) patch 14 mg  14 mg Transdermal Daily Adrian SaranMody, Sital, MD   14 mg at 12/27/17 0958  . ondansetron (ZOFRAN) injection 4 mg  4 mg Intravenous Q6H PRN Mody, Sital, MD      . pantoprazole (PROTONIX) EC  tablet 40 mg  40 mg Oral BID AC Sainani, Vivek J, MD      . piperacillin-tazobactam (ZOSYN) IVPB 3.375 g  3.375 g Intravenous Q8H Adrian SaranMody, Sital, MD   Stopped at 12/27/17 1151  . polyethylene glycol (MIRALAX / GLYCOLAX) packet 17 g  17 g Oral Daily PRN Mody, Sital, MD      . polyethylene glycol powder (GLYCOLAX/MIRALAX) container 255 g  1 Container Oral Once Adrian SaranMody, Sital, MD      . senna (SENOKOT) tablet 8.6 mg  1 tablet Oral Daily Mody, Sital, MD   8.6 mg at 12/27/17 0958  . sodium chloride flush (NS) 0.9 % injection 5 mL  5 mL Intravenous Q8H Oley BalmHassell, Daniel, MD   5 mL at 12/27/17 1002  . traMADol (ULTRAM) tablet 50 mg  50 mg Oral Q6H PRN Adrian SaranMody, Sital, MD          Allergies: Allergies  Allergen Reactions  . Contrast Media [Iodinated Diagnostic Agents] Nausea And Vomiting  . Other Other (See Comments)    Cannot have "high doses of anesthesia because of his lungs"      Past Medical History: Past Medical History:  Diagnosis Date  . Acute lower UTI 07/16/2017  . Asthma   . Atrial fibrillation (HCC)   . CHF (congestive heart failure) (HCC)   . Chronic atrial fibrillation (HCC)   .  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 COPD (chronic obstructive pulmonary disease) (HCC)   . Diabetes mellitus without complication (HCC)   . DVT (deep venous thrombosis) (HCC)   . Gastric ulcer   . GERD (gastroesophageal reflux disease)   . Hypercholesteremia   . Hypertension   . Moderate to severe pulmonary hypertension (HCC)--per 2-D echo 07/14/2017 07/15/2017  . PE (pulmonary embolism)   . Sepsis due to undetermined organism with acute respiratory failure (HCC) 07/13/2017     Past Surgical History: Past Surgical History:  Procedure Laterality Date  . HERNIA REPAIR    . IR NEPHROSTOMY PLACEMENT LEFT  12/26/2017  . PERIPHERAL VASCULAR CATHETERIZATION N/A 11/03/2015   Procedure: IVC Filter Insertion;  Surgeon: Annice Needy, MD;  Location: [facility] INVASIVE CV LAB;  Service:  Cardiovascular;  Laterality: N/A;  . PERIPHERAL VASCULAR CATHETERIZATION N/A 07/24/2016   Procedure: IVC Filter Removal;  Surgeon: Annice Needy, MD;  Location: [facility] INVASIVE CV LAB;  Service: Cardiovascular;  Laterality: N/A;  . PROSTATE SURGERY       Family History: Family History  Problem Relation Age of Onset  . CAD Brother   . Congestive Heart Failure Brother      Social History: Social History   Socioeconomic History  . Marital status: Married    Spouse name: Not on file  . Number of children: Not on file  . Years of education: Not on file  . Highest education level: Not on file  Social Needs  . Financial resource strain: Not on file  . Food insecurity - worry: Not on file  . Food insecurity - inability: Not on file  . Transportation needs - medical: Not on file  . Transportation needs - non-medical: Not on file  Occupational History  . Not on file  Tobacco Use  . Smoking status: Current Every Day Smoker    Packs/day: 1.00    Years: 45.00    Pack years: 45.00    Types: Cigarettes  . Smokeless tobacco: Never Used  Substance and Sexual Activity  . Alcohol use: No  . Drug use: No  . Sexual activity: Not on file  Other Topics Concern  . Not on file  Social History Narrative  . Not on file     Review of Systems: Limited history can be obtained from the patient Gen: No fevers or chills HEENT: Denies hearing or vision problems CV: No chest pain or shortness of breath Resp: No cough or sputum production GI: Appetite has been poor GU : Large left ureteral stone MS: No acute complaints, but does report generalized weakness Derm:  No acute complaints Psych:No acute complaints Heme: No acute complaints Neuro: No acute complaints EndocrineNo acute complaints  Vital Signs: Blood pressure 119/85, pulse 90, temperature 97.6 F (36.4 C), temperature source Axillary, resp. rate (!) 27, height 6' (1.829 m), weight 88.4 kg (194 lb 14.2 oz), SpO2 (!) 88  %.   Intake/Output Summary (Last 24 hours) at 12/27/2017 1536 Last data filed at 12/27/2017 1400 Gross per 24 hour  Intake 1850 ml  Output 500 ml  Net 1350 ml    Weight trends: Filed Weights   12/26/17 0843 12/26/17 1216  Weight: 89.4 kg (197 lb) 88.4 kg (194 lb 14.2 oz)    Physical Exam: General:  Elderly gentleman, laying in the bed  HEENT  anicteric, moist oral mucous membranes  Neck:  Supple, no masses  Lungs:  Normal breathing effort, mild basilar crackles  Heart::  Irregular, tachycardic  Abdomen:  Soft, mildly distended  Extremities:  Trace edema  Neurologic:  Alert, oriented  Skin:  No acute rashes   Left nephrostomy tube is in place  Foley:  Present       Lab results: Basic Metabolic Panel: Recent Labs  Lab 12/26/17 0842 12/27/17 0333  NA 133* 134*  K 4.9 5.1  CL 97* 98*  CO2 24 23  GLUCOSE 118* 54*  BUN 51* 52*  CREATININE 2.66* 2.39*  CALCIUM 9.6 9.4    Liver Function Tests: Recent Labs  Lab 12/26/17 0842  AST 28  ALT 20  ALKPHOS 90  BILITOT 1.8*  PROT 6.2*  ALBUMIN 3.4*   Recent Labs  Lab 12/26/17 0842  LIPASE 21   No results for input(s): AMMONIA in the last 168 hours.  CBC: Recent Labs  Lab 12/26/17 0842 12/27/17 0333  WBC 20.3* 14.2*  NEUTROABS 18.9*  --   HGB 15.8 14.4  HCT 49.5 45.9  MCV 92.2 92.2  PLT 126* 107*    Cardiac Enzymes: No results for input(s): CKTOTAL, TROPONINI in the last 168 hours.  BNP: Invalid input(s): POCBNP  CBG: Recent Labs  Lab 12/26/17 1216 12/27/17 0628 12/27/17 0740 12/27/17 1131 12/27/17 1208  GLUCAP 79 101* 78 62* 83    Microbiology: Recent Results (from the past 720 hour(s))  Culture, blood (routine x 2)     Status: None (Preliminary result)   Collection Time: 12/26/17  9:55 AM  Result Value Ref Range Status   Specimen Description BLOOD LEFT FA  Final   Special Requests   Final    BOTTLES DRAWN AEROBIC AND ANAEROBIC Blood Culture results may not be optimal due to an  inadequate volume of blood received in culture bottles   Culture   Final    NO GROWTH < 24 HOURS Performed at [facility], [address] [place], [place] [address]    Report Status PENDING  Incomplete  Culture, blood (routine x 2)     Status: None (Preliminary result)   Collection Time: 12/26/17  9:55 AM  Result Value Ref Range Status   Specimen Description BLOOD LEFT UPPER ARM  Final   Special Requests   Final    BOTTLES DRAWN AEROBIC AND ANAEROBIC Blood Culture adequate volume   Culture   Final    NO GROWTH < 24 HOURS Performed at [facility], [address], [place], [place] [address]    Report Status PENDING  Incomplete  Urine culture     Status: None   Collection Time: 12/26/17 11:20 AM  Result Value Ref Range Status   Specimen Description   Final    URINE, RANDOM Performed at [facility], [address], [place], [place] [address]    Special Requests   Final    NONE Performed at [facility], [address], [place], [place] [address]    Culture   Final    NO GROWTH Performed at [facility], [address] [place], [place] [address]    Report Status 12/27/2017 FINAL  Final  MRSA PCR Screening     Status: None   Collection Time: 12/26/17 12:18 PM  Result Value Ref Range Status   MRSA by PCR NEGATIVE NEGATIVE Final    Comment:        The GeneXpert MRSA Assay (FDA approved for NASAL specimens only), is one component of a comprehensive MRSA colonization surveillance program. It is not intended to diagnose MRSA  infection nor to guide or monitor treatment for MRSA infections. Performed at [facility], [address] [place], [place] [address]      Coagulation Studies: Recent Labs    12/26/17 6045  LABPROT 16.2*  INR 1.31    Urinalysis: Recent Labs    12/26/17 1120  COLORURINE YELLOW*  LABSPEC 1.014  PHURINE 5.0  GLUCOSEU NEGATIVE  HGBUR MODERATE*  BILIRUBINUR NEGATIVE  KETONESUR  NEGATIVE  PROTEINUR 30*  NITRITE NEGATIVE  LEUKOCYTESUR NEGATIVE        Imaging: Ct Abdomen Pelvis Wo Contrast  Result Date: 12/26/2017 CLINICAL DATA:  77 year old male with abdominal distention, unable to have bowel movement for 4 days. EXAM: CT ABDOMEN AND PELVIS WITHOUT CONTRAST TECHNIQUE: Multidetector CT imaging of the abdomen and pelvis was performed following the standard protocol without IV contrast. COMPARISON:  CT Abdomen and Pelvis without contrast 07/13/2017 and earlier. FINDINGS: Lower chest: Moderate-sized layering pleural effusions, substantially increased since August when trace pleural fluid was present. Associated compressive lower lobe atelectasis. No air bronchograms. Underlying emphysema suspected. Stable cardiomegaly. Calcified coronary artery atherosclerosis. No pericardial effusion. Hepatobiliary: Surgically absent gallbladder. Stable and negative noncontrast CT appearance of the liver. Pancreas: Negative. Spleen: Diminutive, negative. Adrenals/Urinary Tract: Normal adrenal glands. The right kidney is normal aside from calcified renal artery atherosclerosis. The right ureter is decompressed to the bladder. The left kidney is hydronephrotic with moderate left pararenal space stranding and small volume of left pararenal and layering left retroperitoneal fluid. There are multiple bulky calcifications within the left midpole and lower pole renal collecting system (up to 22 millimeters individually). There is a bulky oval obstructing left ureteral calculus measuring 11 millimeters diameter by 18 millimeters in length located about 8 centimeters from the left ureteropelvic junction (coronal image 72). Dilated upstream ureter. Decompressed downstream ureter to the urinary bladder. Mildly to moderately distended but otherwise unremarkable urinary bladder. Stomach/Bowel: Relatively decompressed rectum with no definite rectal abnormality. Redundant sigmoid colon. Gas distended distal  sigmoid colon. The junction of the descending and sigmoid colon is herniated along the left inguinal canal. There is diverticulosis of the herniated bowel which contains gas and stool. The herniated loops do not appear incarcerated. Upstream descending colon gas distention is similar to that of the distal sigmoid. Moderately gas distended and redundant transverse colon with relatively little stool. Moderately gas and stool distended right colon and cecum. The cecum measures up to 11 centimeters diameter and is on a lax mesentery, and the terminal ileum appears to be located along the lateral margin of the cecum, with the ileocecal valve suspected on or near image 40 of series 2. There are gas and fluid-filled loops of small bowel throughout the abdomen and pelvis. Small bowel loops measure 3.5-4 centimeters diameter. The terminal ileum is decompressed containing a small volume of fluid. The stomach is mildly distended with gas. The duodenum is relatively decompressed. No abdominal free air.  No definite abdominal free fluid. Vascular/Lymphatic: Extensive Aortoiliac calcified atherosclerosis. Vascular patency is not evaluated in the absence of IV contrast. 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 No lymphadenopathy is evident. Reproductive: Large bowel containing left inguinal hernia. Otherwise negative. Other: Mild presacral stranding, not definitely increased since 2,018. Musculoskeletal: Stable visualized osseous structures. Chronic SI joint ankylosis. IMPRESSION: 1. Multiple acute abnormalities which could contribute to abdominal pain and distension: 2. Acute Obstructive Uropathy on the left due to a bulky 18 millimeter mid left ureteral calculus. Possible left forniceal  rupture. 3. Acute Bowel Obstruction suspected, and appears multifactorial: - Left Inguinal Hernia containing the junction of the descending and sigmoid colon which may be partially  obstructing. - partial versus early high-grade Small Bowel Obstruction which may be secondary to compression of the terminal ileum (which has an unusual far lateral position) by the dilated cecum. 4. No pneumoperitoneum. A small volume of free fluid in the left abdomen felt related to # 2. 5. Moderate-sized layering bilateral pleural effusions with lung base atelectasis. 6. Aortic Atherosclerosis (ICD10-I70.0) and Emphysema (ICD10-J43.9). Electronically Signed   By: Odessa Fleming M.D.   On: 12/26/2017 09:52   Dg Chest 2 View  Result Date: 12/26/2017 CLINICAL DATA:  Generalized abdominal pain and distention. EXAM: CHEST  2 VIEW COMPARISON:  Radiograph of November 22, 2017. FINDINGS: Stable cardiomegaly. No pneumothorax is noted. Mild bibasilar atelectasis and effusions are noted. Bony thorax is unremarkable. IMPRESSION: Mild bibasilar atelectasis and pleural effusions. Electronically Signed   By: Lupita Raider, M.D.   On: 12/26/2017 09:52   Dg Abd 1 View  Result Date: 12/27/2017 CLINICAL DATA:  Ileus EXAM: ABDOMEN - 1 VIEW COMPARISON:  CT abdomen and pelvis December 26, 2017 FINDINGS: There is now a nephrostomy catheter on the left. There are multiple calculi in the left kidney, unchanged. There is generalized bowel dilatation without appreciable air-fluid levels. No evident free air. There are multiple foci of arterial vascular calcification. IMPRESSION: Bowel gas pattern consistent with either ileus or a degree of bowel obstruction. No free air evident. Nephrostomy catheter now present on the left. Multiple left renal calculi present. Electronically Signed   By: Bretta Bang III M.D.   On: 12/27/2017 09:13   [place] Intraoperative  Result Date: 12/26/2017 CLINICAL DATA:  Ultrasound was provided for use by the ordering physician, and a technical charge was applied by the performing facility.  No radiologist interpretation/professional services rendered.   Ir Nephrostomy Placement Left  Result Date:  12/27/2017 CLINICAL DATA:  Left nephrolithiasis and hydronephrosis with possible sepsis or pyonephrosis EXAM: LEFT PERCUTANEOUS NEPHROSTOMY CATHETER PLACEMENT UNDER ULTRASOUND AND FLUOROSCOPIC GUIDANCE FLUOROSCOPY TIME:  seconds TECHNIQUE: The procedure, risks (including but not limited to bleeding, infection, organ damage ), benefits, and alternatives were explained to the patient. Questions regarding the procedure were encouraged and answered. The patient understands and consents to the procedure. Leftflank region prepped with chlorhexidine, draped in usual sterile fashion, infiltrated locally with 1% lidocaine. Patient was already receiving adequate prophylactic antibiotic coverage. Intravenous Fentanyl and Versed were administered as conscious sedation during continuous monitoring of the patient's level of consciousness and physiological / cardiorespiratory status by the radiology RN, with a total moderate sedation time of 30 minutes. Under real-time ultrasound guidance, a 21-gauge trocar needle was advanced into a posterior lower pole calyx. Ultrasound image documentation was saved. Urine spontaneously returned through the needle. Needle was exchanged over a guidewire for transitional dilator. Contrast injection confirmed appropriate positioning. Multiple filling defects consistent with renal calculi are evident. Catheter was exchanged over a guidewire for a 10 French pigtail catheter, formed centrally within the left renal collecting system. Cloudy urine returned. Contrast injection confirms appropriate positioning and patency. Catheter secured externally with 0 Prolene suture and placed to external drain bag. COMPLICATIONS: COMPLICATIONS none IMPRESSION: 1. Technically successful left percutaneous nephrostomy catheter placement. Electronically Signed   By: Corlis Leak M.D.   On: 12/27/2017 11:22      Assessment & Plan: Pt is a 77 y.o. African-American   male with medical problems of  atrial fibrillation,  congestive heart failure, COPD, diabetes, DVT, severe pulmonary hypertension, history of kidney stones, who was admitted to [facility] on 12/26/2017 for evaluation of weakness, fatigue and decreased appetite 2-3 days prior to admission.  1.  Acute renal failure, secondary to left hydronephrosis Baseline creatinine 1.21/GFR greater than 60 from December 13, 2017  2.  Left hydronephrosis from 18 mm ureteral stone, status post left nephrostomy  Patient serum creatinine has improved slightly after nephrostomy tube was placed Electrolytes and volume status are acceptable No acute indication for dialysis Follow urology recommendations

## 2017-12-27 NOTE — Evaluation (Signed)
Physical Therapy Evaluation Patient Details Name: Austin [name] MRN: [id number] DOB: [birth year] Today's Date: 12/27/2017   History of Present Illness  Pt is a76 y.o.malewith medical problems of atrial fibrillation, congestive heart failure, COPD, diabetes, DVT, severe pulmonary hypertension, history of kidney stones, who was admitted to [facility] on1/16/2019for evaluation of weakness, fatigue and decreased appetite 2-3 days prior to admission. Workup includes a CT scan of the abdomen which showed acute obstructive uropathy on the left due to 18 mm left ureteral calculus. Acute bowel obstruction and a significant left inguinal hernia was also found. Patient has bilateral moderate size pleural effusions, aortic atherosclerosis and emphysema.  He underwent emergent left percutaneous venous nephrostomy tube on January 16.  Assessment includes: Obstructive nephrolithiasis, partial small bowel obstruction, acute on chronic renal failure, HTN, DM II, and GERD.     Clinical Impression  Pt presents with significant deficits in strength, transfers, mobility, gait, balance, and activity tolerance.  Pt required +2 mod to max A with bed mobility tasks and was only able to remain sitting at EOB for 2-3 min before requiring to return to supine.  Pt declined to attempt to stand at EOB secondary to SOB and general fatigue.  Pt's SpO2 was very hard to read during session with both the ICU sensor and portable sensors frequently failing to get readings but was in the upper 80s to very low 90s when a reading was obtained.  Pt will benefit from PT services in a SNF setting upon discharge to safely address above deficits for decreased caregiver assistance and eventual return to PLOF.      Follow Up Recommendations SNF    Equipment Recommendations  None recommended by PT    Recommendations for Other Services       Precautions / Restrictions Precautions Precautions: Fall Restrictions Weight Bearing  Restrictions: No      Mobility  Bed Mobility Overal bed mobility: Needs Assistance Bed Mobility: Supine to Sit;Sit to Supine     Supine to sit: +2 for physical assistance;Mod assist Sit to supine: Mod assist;+2 for physical assistance   General bed mobility comments: +2 Mod A for upper body and BLEs in and out of bed  Transfers                 General transfer comment: Unable/unsafe to attempt  Ambulation/Gait             General Gait Details: Unable/unsafe to attempt  Stairs            Wheelchair Mobility    Modified Rankin (Stroke Patients Only)       Balance Overall balance assessment: Needs assistance Sitting-balance support: Feet unsupported;Bilateral upper extremity supported Sitting balance-Leahy Scale: Fair         Standing balance comment: Unable                             Pertinent Vitals/Pain Pain Assessment: No/denies pain    Home Living Family/patient expects to be discharged to:: Private residence Living Arrangements: Spouse/significant other;Other relatives(Two adult grandchildren) Available Help at Discharge: Family;Available PRN/intermittently(Spouse is limited physically with CA on chemo) Type of Home: House Home Access: Stairs to enter Entrance Stairs-Rails: None Entrance Stairs-Number of Steps: 2 Home Layout: One level Home Equipment: Walker - 2 wheels;Cane - single point      Prior Function Level of Independence: Independent         Comments: Pt reports being Ind with amb  without AD prior to recent previous admission late December, Ind with ADLs, no fall history     Hand Dominance   Dominant Hand: Right    Extremity/Trunk Assessment   Upper Extremity Assessment Upper Extremity Assessment: Generalized weakness    Lower Extremity Assessment Lower Extremity Assessment: Generalized weakness       Communication   Communication: No difficulties  Cognition Arousal/Alertness:  Lethargic Behavior During Therapy: Flat affect Overall Cognitive Status: No family/caregiver present to determine baseline cognitive functioning                                        General Comments      Exercises Total Joint Exercises Ankle Circles/Pumps: AROM;Both;10 reps Quad Sets: Strengthening;Both;10 reps Hip ABduction/ADduction: AAROM;Both;5 reps Straight Leg Raises: AAROM;Both;5 reps Long Arc Quad: AROM;Both;10 reps Knee Flexion: AROM;Both;10 reps   Assessment/Plan    PT Assessment Patient needs continued PT services  PT Problem List Decreased strength;Decreased activity tolerance;Decreased balance;Decreased mobility       PT Treatment Interventions DME instruction;Gait training;Functional mobility training;Neuromuscular re-education;Balance training;Therapeutic exercise;Therapeutic activities;Patient/family education;Stair training    PT Goals (Current goals can be found in the Care Plan section)  Acute Rehab PT Goals Patient Stated Goal: Improved strength PT Goal Formulation: With patient Time For Goal Achievement: 2017-12-25 Potential to Achieve Goals: Good    Frequency Min 2X/week   Barriers to discharge Inaccessible home environment;Decreased caregiver support      Co-evaluation               AM-PAC PT "6 Clicks" Daily Activity  Outcome Measure Difficulty turning over in bed (including adjusting bedclothes, sheets and blankets)?: Unable Difficulty moving from lying on back to sitting on the side of the bed? : Unable Difficulty sitting down on and standing up from a chair with arms (e.g., wheelchair, bedside commode, etc,.)?: Unable Help needed moving to and from a bed to chair (including a wheelchair)?: Total Help needed walking in hospital room?: Total Help needed climbing 3-5 steps with a railing? : Total 6 Click Score: 6    End of Session Equipment Utilized During Treatment: Oxygen Activity Tolerance: Patient limited by  fatigue Patient left: in bed;with bed alarm set;with call bell/phone within reach Nurse Communication: Mobility status PT Visit Diagnosis: Muscle weakness (generalized) (M62.81);Difficulty in walking, not elsewhere classified (R26.2)    Time: [id number] PT Time Calculation (min) (ACUTE ONLY): 40 min   Charges:   PT Evaluation $PT Eval Low Complexity: 1 Low PT Treatments $Therapeutic Exercise: 8-22 mins   PT G Codes:        Elly Modena. Scott Rory Xiang PT, DPT 12/27/17, 5:34 PM

## 2017-12-28 ENCOUNTER — Telehealth: Payer: Self-pay | Admitting: Radiology

## 2017-12-28 LAB — CBC
HCT: 46.4 % (ref 40.0–52.0)
Hemoglobin: 14.8 g/dL (ref 13.0–18.0)
MCH: 29.4 pg (ref 26.0–34.0)
MCHC: 31.9 g/dL — AB (ref 32.0–36.0)
MCV: 92 fL (ref 80.0–100.0)
Platelets: 101 10*3/uL — ABNORMAL LOW (ref 150–440)
RBC: 5.05 MIL/uL (ref 4.40–5.90)
RDW: 16.9 % — AB (ref 11.5–14.5)
WBC: 9.8 10*3/uL (ref 3.8–10.6)

## 2017-12-28 LAB — BASIC METABOLIC PANEL
Anion gap: 11 (ref 5–15)
BUN: 56 mg/dL — AB (ref 6–20)
CHLORIDE: 101 mmol/L (ref 101–111)
CO2: 24 mmol/L (ref 22–32)
CREATININE: 2.01 mg/dL — AB (ref 0.61–1.24)
Calcium: 9.1 mg/dL (ref 8.9–10.3)
GFR calc non Af Amer: 31 mL/min — ABNORMAL LOW (ref >60)
GFR, EST AFRICAN AMERICAN: 36 mL/min — AB (ref >60)
GLUCOSE: 129 mg/dL — AB (ref 65–99)
Potassium: 5.2 mmol/L — ABNORMAL HIGH (ref 3.5–5.1)
Sodium: 136 mmol/L (ref 135–145)

## 2017-12-28 LAB — GLUCOSE, CAPILLARY
GLUCOSE-CAPILLARY: 141 mg/dL — AB (ref 65–99)
GLUCOSE-CAPILLARY: 160 mg/dL — AB (ref 65–99)
GLUCOSE-CAPILLARY: 190 mg/dL — AB (ref 65–99)
GLUCOSE-CAPILLARY: 144 mg/dL — AB (ref 65–99)
Glucose-Capillary: 124 mg/dL — ABNORMAL HIGH (ref 65–99)

## 2017-12-28 MED ORDER — GLUCERNA SHAKE PO LIQD
237.0000 mL | Freq: Three times a day (TID) | ORAL | Status: DC
  Administered 2017-12-28 – 2017-12-29 (×4): 237 mL via ORAL

## 2017-12-28 NOTE — Progress Notes (Signed)
[facility] [place] [facility]   PATIENT NAME: Austin [name]    MR#:  [id number]  DATE OF BIRTH:  June 08, 1941  SUBJECTIVE:   No acute events overnight. Patient still complaining of some abdominal pain. Creatinine is improved since yesterday. Patient transferred out of the ICU yesterday. Percutaneous nephrostomy tube is draining yellow urine.  REVIEW OF SYSTEMS:    Review of Systems  Constitutional: Negative for chills and fever.  HENT: Negative for congestion and tinnitus.   Eyes: Negative for blurred vision and double vision.  Respiratory: Negative for cough, shortness of breath and wheezing.   Cardiovascular: Negative for chest pain, orthopnea and PND.  Gastrointestinal: Positive for abdominal pain. Negative for diarrhea, nausea and vomiting.  Genitourinary: Negative for dysuria and hematuria.  Neurological: Negative for dizziness, sensory change and focal weakness.  All other systems reviewed and are negative.   Nutrition: heart healthy/Carb control Tolerating Diet: Yes Tolerating PT: Await Eval.   DRUG ALLERGIES:   Allergies  Allergen Reactions  . Contrast Media [Iodinated Diagnostic Agents] Nausea And Vomiting  . Other Other (See Comments)    Cannot have "high doses of anesthesia because of his lungs"    VITALS:  Blood pressure 118/83, pulse 91, temperature 97.8 F (36.6 C), temperature source Oral, resp. rate 20, height 6' (1.829 m), weight 89.2 kg (196 lb 9.6 oz), SpO2 93 %.  PHYSICAL EXAMINATION:   Physical Exam  GENERAL:  77 y.o.-year-old patient lying in bed in no acute distress.  EYES: Pupils equal, round, reactive to light and accommodation. No scleral icterus. Extraocular muscles intact.  HEENT: Head atraumatic, normocephalic. Oropharynx and nasopharynx clear.  NECK:  Supple, no jugular venous distention. No thyroid enlargement, no tenderness.  LUNGS: Normal breath sounds bilaterally, no wheezing, rales, rhonchi. No use of accessory  muscles of respiration.  CARDIOVASCULAR: S1, S2 normal. No murmurs, rubs, or gallops.  ABDOMEN: Soft, nontender, nondistended. Bowel sounds present. No organomegaly or mass.  EXTREMITIES: No cyanosis, clubbing or edema b/l.    NEUROLOGIC: Cranial nerves II through XII are intact. No focal Motor or sensory deficits b/l.  Follows simple commands. Globally weak PSYCHIATRIC: The patient is alert and oriented x 2.   SKIN: No obvious rash, lesion, or ulcer.   Foley catheter in place with yellow urine draining. Left-sided percutaneous nephrostomy tube in place with yellow urine in bag  LABORATORY PANEL:   CBC Recent Labs  Lab 12/28/17 0624  WBC 9.8  HGB 14.8  HCT 46.4  PLT 101*   ------------------------------------------------------------------------------------------------------------------  Chemistries  Recent Labs  Lab 12/26/17 0842  12/28/17 0624  NA 133*   < > 136  K 4.9   < > 5.2*  CL 97*   < > 101  CO2 24   < > 24  GLUCOSE 118*   < > 129*  BUN 51*   < > 56*  CREATININE 2.66*   < > 2.01*  CALCIUM 9.6   < > 9.1  AST 28  --   --   ALT 20  --   --   ALKPHOS 90  --   --   BILITOT 1.8*  --   --    < > = values in this interval not displayed.   ------------------------------------------------------------------------------------------------------------------  Cardiac Enzymes No results for input(s): TROPONINI in the last 168 hours. ------------------------------------------------------------------------------------------------------------------  RADIOLOGY:  Dg Abd 1 View  Result Date: 12/27/2017 CLINICAL DATA:  Ileus EXAM: ABDOMEN - 1 VIEW COMPARISON:  CT abdomen and pelvis  December 26, 2017 FINDINGS: There is now a nephrostomy catheter on the left. There are multiple calculi in the left kidney, unchanged. There is generalized bowel dilatation without appreciable air-fluid levels. No evident free air. There are multiple foci of arterial vascular calcification. IMPRESSION:  Bowel gas pattern consistent with either ileus or a degree of bowel obstruction. No free air evident. Nephrostomy catheter now present on the left. Multiple left renal calculi present. Electronically Signed   By: Bretta BangWilliam  Woodruff III M.D.   On: 12/27/2017 09:13   [place] Intraoperative  Result Date: 12/26/2017 CLINICAL DATA:  Ultrasound was provided for use by the ordering physician, and a technical charge was applied by the performing facility.  No radiologist interpretation/professional services rendered.   Ir Nephrostomy Placement Left  Result Date: 12/27/2017 CLINICAL DATA:  Left nephrolithiasis and hydronephrosis with possible sepsis or pyonephrosis EXAM: LEFT PERCUTANEOUS NEPHROSTOMY CATHETER PLACEMENT UNDER ULTRASOUND AND FLUOROSCOPIC GUIDANCE FLUOROSCOPY TIME:  seconds TECHNIQUE: The procedure, risks (including but not limited to bleeding, infection, organ damage ), benefits, and alternatives were explained to the patient. Questions regarding the procedure were encouraged and answered. The patient understands and consents to the procedure. Leftflank region prepped with chlorhexidine, draped in usual sterile fashion, infiltrated locally with 1% lidocaine. Patient was already receiving adequate prophylactic antibiotic coverage. Intravenous Fentanyl and Versed were administered as conscious sedation during continuous monitoring of the patient's level of consciousness and physiological / cardiorespiratory status by the radiology RN, with a total moderate sedation time of 30 minutes. Under real-time ultrasound guidance, a 21-gauge trocar needle was advanced into a posterior lower pole calyx. Ultrasound image documentation was saved. Urine spontaneously returned through the needle. Needle was exchanged over a guidewire for transitional dilator. Contrast injection confirmed appropriate positioning. Multiple filling defects consistent with renal calculi are evident. Catheter was exchanged over a guidewire for a  10 French pigtail catheter, formed centrally within the left renal collecting system. Cloudy urine returned. Contrast injection confirms appropriate positioning and patency. Catheter secured externally with 0 Prolene suture and placed to external drain bag. COMPLICATIONS: COMPLICATIONS none IMPRESSION: 1. Technically successful left percutaneous nephrostomy catheter placement. Electronically Signed   By: Corlis Leak  Hassell M.D.   On: 12/27/2017 11:22     ASSESSMENT AND PLAN:   77 year old male with past medical history of chronic atrial fibrillation, CHF, COPD, diabetes, hypertension, hyperlipidemia, history of pulmonary embolism who presented to the hospital due to abdominal pain and noted to have partial small bowel obstruction secondary to inguinal hernia and also noted to have nephrolithiasis.  1. Obstructive nephrolithiasis-patient noted to have a large left-sided ureteral stone. S/p IR guided left-sided nephrostomy tube placement postop day #1. -Creatinine is improving. Abdominal pain is also improved. Urine cultures remain negative. We'll DC IV Zosyn for now.  -Appreciate urology input and we'll DC Foley catheter for now.  2. Partial small bowel obstruction-this was secondary to a inguinal hernia. As per surgery this was easily reducible. They think the patient's ileus is secondary to the nephrolithiasis and not likely the hernia. No acute surgical indication continue supportive care for now.  3. Acute on chronic renal failure-secondary to obstructive uropathy. -Status post percutaneous nephrostomy tube on the left. Creatinine is trending down. Continue IV fluids but will lower rate, renal dose meds, avoid nephrotoxins, follow urine output. - appreciate Nephro, Urology input.   4. Essential hypertension-continue IV hydralazine, Metoprolol as needed. - BP stable.   5. Diabetes type 2 without complication-continue sliding scale insulin.  6. GERD-continue Protonix.  7.  Tobacco abuse-continue  nicotine patch.  All the records are reviewed and case discussed with Care Management/Social Worker. Management plans discussed with the patient, family and they are in agreement.  CODE STATUS: Full code  DVT Prophylaxis: Lovenox  TOTAL TIME TAKING CARE OF THIS PATIENT: 30 minutes.   POSSIBLE D/C IN 2-3 DAYS, DEPENDING ON CLINICAL CONDITION.   Houston Siren M.D on 12/28/2017 at 2:45 PM  Between 7am to 6pm - Pager - [phone]  After 6pm go to [email or web address] - Scientist, research (life sciences) [facility]  Office  [phone]  CC: Primary care physician; [facility]

## 2017-12-28 NOTE — Progress Notes (Signed)
Inpatient Diabetes Program Recommendations  AACE/ADA: New Consensus Statement on Inpatient Glycemic Control (2015)  Target Ranges:  Prepandial:   less than 140 mg/dL      Peak postprandial:   less than 180 mg/dL (1-2 hours)      Critically ill patients:  140 - 180 mg/dL   Lab Results  Component Value Date   GLUCAP 141 (H) 12/28/2017   HGBA1C 8.4 (H) 12/26/2017    Review of Glycemic Control  Results for Austin [name], Austin [name] (MRN [id number]) as of 12/28/2017 08:00  Ref. Range 12/27/2017 12:08 12/27/2017 16:29 12/27/2017 21:05 12/27/2017 22:57 12/28/2017 05:19  Glucose-Capillary Latest Ref Range: 65 - 99 mg/dL 83 409181 (H) 811216 (H) 914225 (H) 141 (H)     Diabetes history:DM2 Outpatient Diabetes medications:Glipizide 5 mg BID, Lantus 10 units daily, Humalog 0-9 units TID with meals Current orders for Inpatient glycemic control:Novolog 0-9 units TID with meals, Novolog 0-5 units qhs  Inpatient Diabetes Program Recommendations:Agree with current medications for blood sugar management.   Susette RacerJulie Nashae Maudlin, RN, BA, MHA, CDE Diabetes Coordinator Inpatient Diabetes Program  [phone] (Team Pager) [phone] [employer]([facility] Office) 12/28/2017 8:01 AM

## 2017-12-28 NOTE — Telephone Encounter (Signed)
Notified [name] on 2A of appt. Appt reminder mailed to pt 12/28/2017 Austin [name]

## 2017-12-28 NOTE — Plan of Care (Signed)
  Progressing Education: Knowledge of General Education information will improve 12/28/2017 1419 - Progressing by Tomie ChinaJackson, Nat Lowenthal Cecelie, RN Health Behavior/Discharge Planning: Ability to manage health-related needs will improve 12/28/2017 1419 - Progressing by Tomie ChinaJackson, Nicklous Aburto Cecelie, RN Clinical Measurements: Ability to maintain clinical measurements within normal limits will improve 12/28/2017 1419 - Progressing by Tomie ChinaJackson, Olanna Percifield Cecelie, RN Will remain free from infection 12/28/2017 1419 - Progressing by Tomie ChinaJackson, Aleya Durnell Cecelie, RN Diagnostic test results will improve 12/28/2017 1419 - Progressing by Tomie ChinaJackson, Jeannetta Cerutti Cecelie, RN Respiratory complications will improve 12/28/2017 1419 - Progressing by Tomie ChinaJackson, Vinnie Gombert Cecelie, RN Cardiovascular complication will be avoided 12/28/2017 1419 - Progressing by Tomie ChinaJackson, Renae Mottley Cecelie, RN Activity: Risk for activity intolerance will decrease 12/28/2017 1419 - Progressing by Tomie ChinaJackson, Day Greb Cecelie, RN Nutrition: Adequate nutrition will be maintained 12/28/2017 1419 - Progressing by Tomie ChinaJackson, Stevenson Windmiller Cecelie, RN Coping: Level of anxiety will decrease 12/28/2017 1419 - Progressing by Tomie ChinaJackson, Lauriel Helin Cecelie, RN Elimination: Will not experience complications related to bowel motility 12/28/2017 1419 - Progressing by Tomie ChinaJackson, Juelz Claar Cecelie, RN Will not experience complications related to urinary retention 12/28/2017 1419 - Progressing by Tomie ChinaJackson, Noraa Pickeral Cecelie, RN Pain Managment: General experience of comfort will improve 12/28/2017 1419 - Progressing by Tomie ChinaJackson, Wateen Varon Cecelie, RN Safety: Ability to remain free from injury will improve 12/28/2017 1419 - Progressing by Tomie ChinaJackson, Remonia Otte Cecelie, RN Skin Integrity: Risk for impaired skin integrity will decrease 12/28/2017 1419 - Progressing by Tomie ChinaJackson, Jazyiah Yiu Cecelie, RN Urinary Elimination: Signs and symptoms of infection will decrease 12/28/2017 1419 - Progressing by Tomie ChinaJackson, Eleri Ruben  Cecelie, RN

## 2017-12-28 NOTE — [place] FL2 (Signed)
[place] MEDICAID FL2 LEVEL OF CARE SCREENING TOOL     IDENTIFICATION  Patient Name: Austin [name] Birthdate: 1941-10-03 Sex: male Admission Date (Current Location): 12/26/2017  [facility] and [place] Number:  Chiropodist and Address:  [facility], [address], [place], [place] [address]      Provider Number: [id number]  Attending Physician Name and Address:  Houston Siren, MD  Relative Name and Phone Number:       Current Level of Care: Hospital Recommended Level of Care: Skilled Nursing Facility Prior Approval Number:    Date Approved/Denied:   PASRR Number:    Discharge Plan: SNF    Current Diagnoses: Patient Active Problem List   Diagnosis Date Noted  . Ileus (HCC)   . Scrotal hernia   . Obstructive uropathy 12/26/2017  . Hydronephrosis   . Atrial fibrillation (HCC) 11/08/2017  . Atrial fibrillation and flutter (HCC) 11/06/2017  . 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 Acute on chronic diastolic CHF (congestive heart failure) (HCC)   . Chronic atrial fibrillation (HCC)   . Acute lower UTI 07/16/2017  . Sepsis (HCC)   . Acute respiratory failure with hypoxia (HCC) 07/15/2017  . Moderate to severe pulmonary hypertension (HCC)--per 2-D echo 07/14/2017 07/15/2017  . Mass of lung 07/15/2017  . Ascending aortic aneurysm (HCC) 07/14/2017  . Elevated bilirubin 07/14/2017  . Kidney stone 07/14/2017  . Lactic acidosis 07/14/2017  . Sepsis due to undetermined organism with acute respiratory failure (HCC) 07/13/2017  . COPD exacerbation (HCC) 12/26/2016  . Hypoxia 11/03/2015  . COPD (chronic obstructive pulmonary disease) (HCC) 11/03/2015  . DVT of popliteal vein (HCC) 11/03/2015  . Chronic systolic CHF (congestive heart failure) (HCC) 11/03/2015  . Essential hypertension 11/03/2015  . Diabetes mellitus (HCC) 11/03/2015  . Continuous tobacco abuse 11/03/2015  . Chronic pulmonary embolism  (HCC) 11/03/2015  . 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 Hemoptysis 10/31/2015  . Elevated troponin 09/16/2015  . SOB (shortness of breath) 08/24/2015  . Pulmonary embolism (HCC) 08/24/2015  . 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 Type 2 diabetes mellitus (HCC) 08/16/2015  . HLD (hyperlipidemia) 08/16/2015  . GERD (gastroesophageal reflux disease) 08/16/2015  . Chest pain 08/16/2015  . Constipation 08/16/2015  . HTN (hypertension) 07/21/2015  . COPD (chronic obstructive pulmonary disease) with chronic bronchitis (HCC) 07/21/2015  . CHF (NYHA class IV, ACC/AHA stage D) (HCC) 07/04/2015  . CHF (congestive heart failure) (HCC) 07/04/2015    Orientation RESPIRATION BLADDER Height & Weight     Self  Normal, O2(4 liters) Continent Weight: 196 lb 9.6 oz (89.2 kg) Height:  6' (182.9 cm)  BEHAVIORAL SYMPTOMS/MOOD NEUROLOGICAL BOWEL NUTRITION STATUS  (none) (none) Incontinent Diet  AMBULATORY STATUS COMMUNICATION OF NEEDS Skin   Extensive Assist Verbally PU Stage and Appropriate Care                       Personal Care Assistance Level of Assistance  Bathing, Dressing Bathing Assistance: Maximum assistance Feeding assistance: Limited assistance Dressing Assistance: Maximum assistance     Functional Limitations Info  (no issues reported)          SPECIAL CARE FACTORS FREQUENCY  PT (By licensed PT)                    Contractures Contractures Info: Not present    Additional Factors Info  Code Status,  Allergies Code Status Info: full Allergies Info: contrast media           Current Medications (12/28/2017):  This is the current hospital active medication list Current Facility-Administered Medications  Medication Dose Route Frequency Provider Last Rate Last Dose  . 0.9 %  sodium chloride infusion   Intravenous Continuous Houston Siren, MD 50 mL/hr at 12/28/17 1057    . acetaminophen  (TYLENOL) tablet 650 mg  650 mg Oral Q6H PRN Adrian Saran, MD       Or  . acetaminophen (TYLENOL) suppository 650 mg  650 mg Rectal Q6H PRN Adrian Saran, MD   650 mg at 12/27/17 0751  . alum & mag hydroxide-simeth (MAALOX/MYLANTA) 200-200-20 MG/5ML suspension 30 mL  30 mL Oral Q4H PRN Adrian Saran, MD   30 mL at 12/27/17 0509  . bisacodyl (DULCOLAX) EC tablet 5 mg  5 mg Oral Daily PRN Adrian Saran, MD      . docusate sodium (COLACE) capsule 100 mg  100 mg Oral BID Adrian Saran, MD   100 mg at 12/28/17 0854  . enoxaparin (LOVENOX) injection 30 mg  30 mg Subcutaneous Q24H Cindi Carbon, RPH   30 mg at 12/28/17 1610  . feeding supplement (GLUCERNA SHAKE) (GLUCERNA SHAKE) liquid 237 mL  237 mL Oral TID BM Houston Siren, MD   237 mL at 12/28/17 1349  . hydrALAZINE (APRESOLINE) injection 10 mg  10 mg Intravenous Q6H PRN Mody, Sital, MD      . insulin aspart (novoLOG) injection 0-5 Units  0-5 Units Subcutaneous QHS Merwyn Katos, MD   2 Units at 12/27/17 2324  . insulin aspart (novoLOG) injection 0-9 Units  0-9 Units Subcutaneous TID WC Merwyn Katos, MD   2 Units at 12/28/17 1210  . iohexol (OMNIPAQUE) 300 MG/ML solution 50 mL  50 mL Intravenous Once PRN Oley Balm, MD      . ipratropium-albuterol (DUONEB) 0.5-2.5 (3) MG/3ML nebulizer solution 3 mL  3 mL Nebulization Q6H PRN Eugenie Norrie, NP   3 mL at 12/28/17 1215  . magnesium citrate solution 1 Bottle  1 Bottle Oral Once PRN Adrian Saran, MD      . MEDLINE mouth rinse  15 mL Mouth Rinse BID Merwyn Katos, MD   15 mL at 12/28/17 0859  . metoprolol tartrate (LOPRESSOR) injection 5 mg  5 mg Intravenous Q6H Mody, Sital, MD   5 mg at 12/28/17 1210  . nicotine (NICODERM CQ - dosed in mg/24 hours) patch 14 mg  14 mg Transdermal Daily Adrian Saran, MD   14 mg at 12/28/17 0855  . ondansetron (ZOFRAN) injection 4 mg  4 mg Intravenous Q6H PRN Mody, Sital, MD      . pantoprazole (PROTONIX) EC tablet 40 mg  40 mg Oral BID AC Houston Siren, MD   40  mg at 12/28/17 0854  . polyethylene glycol (MIRALAX / GLYCOLAX) packet 17 g  17 g Oral Daily PRN Mody, Sital, MD      . polyethylene glycol powder (GLYCOLAX/MIRALAX) container 255 g  1 Container Oral Once Adrian Saran, MD      . senna (SENOKOT) tablet 8.6 mg  1 tablet Oral Daily Mody, Sital, MD   8.6 mg at 12/28/17 0854  . sodium chloride flush (NS) 0.9 % injection 5 mL  5 mL Intravenous Q8H Oley Balm, MD   5 mL at 12/28/17 0856  . traMADol (ULTRAM) tablet 50 mg  50 mg Oral Q6H PRN  Adrian SaranMody, Sital, MD         Discharge Medications: Please see discharge summary for a list of discharge medications.  Relevant Imaging Results:  Relevant Lab Results:   Additional Information    York SpanielMonica Tausha Milhoan, LCSW

## 2017-12-28 NOTE — Clinical Social Work Note (Signed)
CS[facility] spoke with [name] and confirmed patient was from their facility for STR. CSW fowarded Austin [name] the PT note and [facility] working [facility] re[facility]auth. CSW visited patient's room but patient was not interactive with CSW. Patient's sister and other family members were in patient's room and they stated that they were unsure where patient's wife may be. CSW explained what CSW visit was pertaining to and that CSW would attempt to reach patient's wife. CSW called patient's wife's number and another relative answered and stated that she was not at home and provided CSW with a cell: [phone]. CSW called this number but could not leave a message because the voicemail box had not been set up. CSW will continue to try and reach patient's wife in order to complete full assessment. York SpanielMonica Hazely [name] MSW,LCSW [phone]

## 2017-12-28 NOTE — Telephone Encounter (Signed)
-----   Message from Vanna ScotlandAshley Brandon, MD sent at 12/28/2017  8:21 AM EST ----- Regarding: f/u in 2 weeks This patient needs outpatient follow-up with me in about 2 weeks to discuss definitive management of his stone.  Currently in the hospital.  Vanna ScotlandAshley Brandon, MD

## 2017-12-28 NOTE — Plan of Care (Signed)
  Progressing Skin Integrity: Risk for impaired skin integrity will decrease 12/28/2017 0417 - Progressing by Stefan Churchogers, Jamarrius Salay M, RN Urinary Elimination: Signs and symptoms of infection will decrease 12/28/2017 0417 - Progressing by Stefan Churchogers, Ahmani Daoud M, RN

## 2017-12-29 LAB — BASIC METABOLIC PANEL
ANION GAP: 10 (ref 5–15)
BUN: 55 mg/dL — AB (ref 6–20)
CHLORIDE: 101 mmol/L (ref 101–111)
CO2: 24 mmol/L (ref 22–32)
Calcium: 9 mg/dL (ref 8.9–10.3)
Creatinine, Ser: 1.92 mg/dL — ABNORMAL HIGH (ref 0.61–1.24)
GFR, EST AFRICAN AMERICAN: 38 mL/min — AB (ref >60)
GFR, EST NON AFRICAN AMERICAN: 32 mL/min — AB (ref >60)
Glucose, Bld: 142 mg/dL — ABNORMAL HIGH (ref 65–99)
POTASSIUM: 5.1 mmol/L (ref 3.5–5.1)
SODIUM: 135 mmol/L (ref 135–145)

## 2017-12-29 LAB — GLUCOSE, CAPILLARY: Glucose-Capillary: 145 mg/dL — ABNORMAL HIGH (ref 65–99)

## 2017-12-29 NOTE — Discharge Planning (Signed)
[facility] called to [facility] EMS called for transport. Pt ready for discharge.

## 2017-12-29 NOTE — Discharge Summary (Signed)
[facility] [place] at [facility]   PATIENT NAME: Austin [name]    MR#:  [id number]  DATE OF BIRTH:  07-Dec-1941  DATE OF ADMISSION:  12/26/2017 ADMITTING PHYSICIAN: Adrian Saran, MD  DATE OF DISCHARGE: 12/29/2017  PRIMARY CARE PHYSICIAN: [facility]    ADMISSION DIAGNOSIS:  Scrotal hernia [K40.90] Kidney stone [N20.0] AKI (acute kidney injury) (HCC) [N17.9]  DISCHARGE DIAGNOSIS:  1.Obstructive uropathy due to large left ureteral stone.  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 Acute lower UTI 07/16/2017  . Asthma   . Atrial fibrillation (HCC)   . CHF (congestive heart failure) (HCC)   . Chronic atrial fibrillation (HCC)   . 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 COPD (chronic obstructive pulmonary disease) (HCC)   . Diabetes mellitus without complication (HCC)   . DVT (deep venous thrombosis) (HCC)   . Gastric ulcer   . GERD (gastroesophageal reflux disease)   . Hypercholesteremia   . Hypertension   . Moderate to severe pulmonary hypertension (HCC)--per 2-D echo 07/14/2017 07/15/2017  . PE (pulmonary embolism)   . Sepsis due to undetermined organism with acute respiratory failure (HCC) 07/13/2017    HOSPITAL COURSE:   78 year old male with past medical history of chronic atrial fibrillation, CHF, COPD, diabetes, hypertension, hyperlipidemia, history of pulmonary embolism who presented to the hospital due to abdominal pain and noted to have partial small bowel obstruction secondary to inguinal hernia and also noted to have nephrolithiasis.  1. Obstructive Uropathy-patient noted to have a large left-sided ureteral stone. S/p IR guided left-sided  nephrostomy tube placement postop day #2  -Creatinine is improving. Abdominal pain is also improved. Urine cultures remain negative. We'll DC IV Zosyn  -Blood cultures remain negative.  -Appreciate urology input and we'll DC Foley catheter for now. -Discussed with Dr. Wilburt Finlay on call.  Patient will follow up with Dr. Lorriane Shire as outpatient  2. Partial small bowel obstruction-this was secondary to a inguinal hernia. As per surgery this was easily reducible. They think the patient's ileus is secondary to the nephrolithiasis and not likely the hernia. No acute surgical indication continue supportive care for now.  3. Acute on chronic renal failure-secondary to obstructive uropathy. -Status post percutaneous nephrostomy tube on the left. Creatinine is trending down. Continue IV fluids but will lower rate, renal dose meds, avoid nephrotoxins, follow urine output. - appreciate Nephro, Urology input.  -Getting down to 1.92 -Lasix on hold--- defer to primary care MD to resume it if needed  4. Essential hypertension-continue IV hydralazine, Metoprolol as needed. - BP stable.  -blood Pressure bit on the softer side.  I will hold off on diltiazem  5. Diabetes type 2 without complication-continue sliding scale insulin. -Resume home insulin regimen  6. GERD-continue Protonix.  Patient will discharge to [facility] today  CONSULTS OBTAINED:  Treatment Team:  Lattie Haw, MD Mosetta Pigeon, MD Vanna Scotland, MD  DRUG ALLERGIES:   Allergies  Allergen Reactions  . Contrast Media [Iodinated Diagnostic Agents] Nausea And Vomiting  . Other Other (See Comments)    Cannot have "high doses of anesthesia because of his lungs"    DISCHARGE MEDICATIONS:   Allergies as of 12/29/2017      Reactions   Contrast Media [iodinated Diagnostic Agents]  Nausea And Vomiting   Other Other (See Comments)   Cannot have "high doses of anesthesia because of his lungs"      Medication  List    STOP taking these medications   diltiazem 240 MG 24 hr capsule Commonly known as:  DILACOR XR   furosemide 40 MG tablet Commonly known as:  LASIX   guaiFENesin 600 MG 12 hr tablet Commonly known as:  MUCINEX   insulin lispro 100 UNIT/ML injection Commonly known as:  HUMALOG   levalbuterol 0.63 MG/3ML nebulizer solution Commonly known as:  XOPENEX   predniSONE 10 MG (21) Tbpk tablet Commonly known as:  STERAPRED UNI-PAK 21 TAB     TAKE these medications   aspirin EC 81 MG tablet Take 81 mg by mouth daily.   budesonide-formoterol 160-4.5 MCG/ACT inhaler Commonly known as:  SYMBICORT Inhale 1 puff into the lungs 2 (two) times daily.   digoxin 0.125 MG tablet Commonly known as:  LANOXIN Take 1 tablet (0.125 mg total) by mouth daily.   glipiZIDE 5 MG tablet Commonly known as:  GLUCOTROL Take 1 tablet (5 mg total) by mouth 2 (two) times daily. DO NOT TAKE THIS DIABETES MEDICINE IF YOUR BLOOD SUGAR IS LESS THAN 130.   guaiFENesin-dextromethorphan 100-10 MG/5ML syrup Commonly known as:  ROBITUSSIN DM Take 10 mLs by mouth every 6 (six) hours as needed for cough.   insulin aspart 100 UNIT/ML injection Commonly known as:  novoLOG Inject 0-5 Units into the skin at bedtime. CBG < 70: implement hypoglycemia protocol CBG 70 - 120: 0 units CBG 121 - 150: 0 units CBG 151 - 200: 0 units CBG 201 - 250: 2 units CBG 251 - 300: 3 units CBG 301 - 350: 4 units CBG 351 - 400: 5 units CBG > 400: call MD and obtain STAT lab verification   insulin aspart 100 UNIT/ML injection Commonly known as:  novoLOG Inject 0-15 Units into the skin 3 (three) times daily with meals. CBG < 70: implement hypoglycemia protocol CBG 70 - 120: 0 units CBG 121 - 150: 2 units CBG 151 - 200: 3 units CBG 201 - 250: 5 units CBG 251 - 300: 8 units CBG 301 - 350: 11 units CBG 351 - 400: 15 units CBG > 400: call MD and obtain STAT lab verification   insulin glargine 100 UNIT/ML injection Commonly  known as:  LANTUS Inject 0.1 mLs (10 Units total) into the skin daily.   ipratropium-albuterol 0.5-2.5 (3) MG/3ML Soln Commonly known as:  DUONEB Take 3 mLs by nebulization every 6 (six) hours.   lactulose 10 GM/15ML solution Commonly known as:  CHRONULAC Take 10 g by mouth 2 (two) times daily.   lovastatin 20 MG tablet Commonly known as:  MEVACOR Take 20 mg by mouth at bedtime. Pt should take with food.   mupirocin cream 2 % Commonly known as:  BACTROBAN Apply topically 2 (two) times daily.   nicotine 14 mg/24hr patch Commonly known as:  NICODERM CQ - dosed in mg/24 hours Place 1 patch (14 mg total) onto the skin daily.   nitroGLYCERIN 0.4 MG SL tablet Commonly known as:  NITROSTAT Place 0.4 mg under the tongue every 5 (five) minutes x 3 doses as needed for chest pain. If no relief call 911 or go to emergency room.   omeprazole 40 MG capsule Commonly known as:  PRILOSEC Take 40 mg by mouth daily.   oxyCODONE 5 MG immediate release tablet Commonly known as:  Oxy IR/ROXICODONE Take  1 tablet (5 mg total) by mouth every 4 (four) hours as needed for moderate pain.   polyethylene glycol packet Commonly known as:  MIRALAX Take 17 g by mouth daily as needed for moderate constipation.   PROAIR HFA 108 (90 Base) MCG/ACT inhaler Generic drug:  albuterol Inhale 1-2 puffs into the lungs every 6 (six) hours as needed for wheezing or shortness of breath.       If you experience worsening of your admission symptoms, develop shortness of breath, life threatening emergency, suicidal or homicidal thoughts you must seek medical attention immediately by calling 911 or calling your MD immediately  if symptoms less severe.  You Must read complete instructions/literature along with all the possible adverse reactions/side effects for all the Medicines you take and that have been prescribed to you. Take any new Medicines after you have completely understood and accept all the possible adverse  reactions/side effects.   Please note  You were cared for by a hospitalist during your hospital stay. If you have any questions about your discharge medications or the care you received while you were in the hospital after you are discharged, you can call the unit and asked to speak with the hospitalist on call if the hospitalist that took care of you is not available. Once you are discharged, your primary care physician will handle any further medical issues. Please note that NO REFILLS for any discharge medications will be authorized once you are discharged, as it is imperative that you return to your primary care physician (or establish a relationship with a primary care physician if you do not have one) for your aftercare needs so that they can reassess your need for medications and monitor your lab values. Today   SUBJECTIVE   No new complaints VITAL SIGNS:  Blood pressure 103/74, pulse 89, temperature 98.2 F (36.8 C), temperature source Oral, resp. rate 18, height 6' (1.829 m), weight 91.2 kg (201 lb), SpO2 96 %.  I/O:    Intake/Output Summary (Last 24 hours) at 12/29/2017 1014 Last data filed at 12/29/2017 16100613 Gross per 24 hour  Intake 2433.75 ml  Output 870 ml  Net 1563.75 ml    PHYSICAL EXAMINATION:  GENERAL:  77 y.o.-year-old patient lying in the bed with no acute distress.  EYES: Pupils equal, round, reactive to light and accommodation. No scleral icterus. Extraocular muscles intact.  HEENT: Head atraumatic, normocephalic. Oropharynx and nasopharynx clear.  NECK:  Supple, no jugular venous distention. No thyroid enlargement, no tenderness.  LUNGS: Normal breath sounds bilaterally, no wheezing, rales,rhonchi or crepitation. No use of accessory muscles of respiration.  CARDIOVASCULAR: S1, S2 normal. No murmurs, rubs, or gallops.  ABDOMEN: Soft, non-tender, non-distended. Bowel sounds present. No organomegaly or mass.  Left nephrostomy tube placement. EXTREMITIES: No pedal  edema, cyanosis, or clubbing.  NEUROLOGIC: Cranial nerves II through XII are intact. Muscle strength 5/5 in all extremities. Sensation intact. Gait not checked.  PSYCHIATRIC: patient is alert and oriented x 3.  SKIN: No obvious rash, lesion, or ulcer.   DATA REVIEW:   CBC  Recent Labs  Lab 12/28/17 0624  WBC 9.8  HGB 14.8  HCT 46.4  PLT 101*    Chemistries  Recent Labs  Lab 12/26/17 0842  12/29/17 0428  NA 133*   < > 135  K 4.9   < > 5.1  CL 97*   < > 101  CO2 24   < > 24  GLUCOSE 118*   < > 142*  BUN 51*   < > 55*  CREATININE 2.66*   < > 1.92*  CALCIUM 9.6   < > 9.0  AST 28  --   --   ALT 20  --   --   ALKPHOS 90  --   --   BILITOT 1.8*  --   --    < > = values in this interval not displayed.    Microbiology Results   Recent Results (from the past 240 hour(s))  Culture, blood (routine x 2)     Status: None (Preliminary result)   Collection Time: 12/26/17  9:55 AM  Result Value Ref Range Status   Specimen Description BLOOD LEFT FA  Final   Special Requests   Final    BOTTLES DRAWN AEROBIC AND ANAEROBIC Blood Culture results may not be optimal due to an inadequate volume of blood received in culture bottles   Culture   Final    NO GROWTH 3 DAYS Performed at [facility], [address], [place], [place] [address]    Report Status PENDING  Incomplete  Culture, blood (routine x 2)     Status: None (Preliminary result)   Collection Time: 12/26/17  9:55 AM  Result Value Ref Range Status   Specimen Description BLOOD LEFT UPPER ARM  Final   Special Requests   Final    BOTTLES DRAWN AEROBIC AND ANAEROBIC Blood Culture adequate volume   Culture   Final    NO GROWTH 3 DAYS Performed at [facility], [address] [place], [place] [address]    Report Status PENDING  Incomplete  Urine culture     Status: None   Collection Time: 12/26/17 11:20 AM  Result Value Ref Range Status   Specimen Description   Final    URINE, RANDOM Performed at  [facility], [address], [place], [place] [address]    Special Requests   Final    NONE Performed at [facility], [address], [place], [place] [address]    Culture   Final    NO GROWTH Performed at [facility], [address], [place], [place] [address]    Report Status 12/27/2017 FINAL  Final  MRSA PCR Screening     Status: None   Collection Time: 12/26/17 12:18 PM  Result Value Ref Range Status   MRSA by PCR NEGATIVE NEGATIVE Final    Comment:        The GeneXpert MRSA Assay (FDA approved for NASAL specimens only), is one component of a comprehensive MRSA colonization surveillance program. It is not intended to diagnose MRSA infection nor to guide or monitor treatment for MRSA infections. Performed at [facility], [address] [place], [place] [address]     RADIOLOGY:  No results found.   Management plans discussed with the patient, family and they are in agreement.  CODE STATUS:     Code Status Orders  (From admission, onward)        Start     Ordered   12/26/17 1217  Full code  Continuous     12/26/17 1216    Code Status History    Date Active Date Inactive Code Status Order ID Comments User Context   12/07/2017 01:35 12/13/2017 18:58 Full Code [id number]  Oralia Manis, MD Inpatient   11/06/2017 19:46 11/09/2017 14:55 Full Code [id number]  Houston Siren, MD Inpatient   07/14/2017 00:55 07/21/2017 19:12 Full Code [id number]  Bobette Mo,  MD Inpatient   12/26/2016 23:05 12/27/2016 20:34 Full Code [id number]  Auburn Bilberry, MD Inpatient   10/31/2015 09:34 11/03/2015 20:11 Full Code [id number]  Arnaldo Natal, MD Inpatient   09/16/2015 01:49 09/16/2015 18:22 Full Code [id number]  Arnaldo Natal, MD ED   08/24/2015 17:42 08/27/2015 16:10 Full Code [id number]  Auburn Bilberry, MD Inpatient   08/16/2015 03:55 08/17/2015 20:22 Full Code [id number]  Oralia Manis, MD Inpatient   07/04/2015 14:22 07/08/2015 16:04 Full  Code [id number]  Altamese Dilling, MD Inpatient      TOTAL TIME TAKING CARE OF THIS PATIENT: *40* minutes.    Enedina Finner M.D on 12/29/2017 at 10:14 AM  Between 7am to 6pm - Pager - [phone] After 6pm go to [email or web address] - Social research officer, government  [facility]  Office  [phone]  CC: Primary care physician; [facility]

## 2017-12-29 NOTE — Clinical Social Work Note (Signed)
Clinical Social Work Assessment  Patient Details  Name: Austin MccreedyJames Allen Lanpher MRN: [id number] Date of Birth: 05-20-1941  Date of referral:  12/29/17               Reason for consult:  Facility Placement                Permission sought to share information with:  Facility Industrial/product designerContact Representative Permission granted to share information::  Yes, Verbal Permission Granted  Name::        Agency::     Relationship::     Contact Information:     Housing/Transportation Living arrangements for the past 2 months:  Skilled Nursing Facility Source of Information:  Patient, Medical Team, Spouse Patient Interpreter Needed:  None Criminal Activity/Legal Involvement Pertinent to Current Situation/Hospitalization:  No - Comment as needed Significant Relationships:  Adult Children, [employer], [employer], Spouse Lives with:  Spouse Do you feel safe going back to the place where you live?  Yes Need for family participation in patient care:  No (Coment)  Care giving concerns:  [facility]   Social Worker assessment / plan:  The CSW contacted the patient's spouse to discuss discharge planning. The patient's [facility]-emergent EMS. CSW updated her on the progress of the patient and the discharge plan for today. The patient's spouse agreed. The CSW has contacted the facility who is able to receive the patient today. The CSW has sent all discharge documentation and will deliver the discharge packet when able. CSW will sign off after that point. Please contact should additional needs arise.  Employment status:  Retired Database administratornsurance information:  Managed Medicare PT Recommendations:  Skilled Nursing Facility Information / Referral to community resources:  Skilled Nursing Facility  Patient/Family's Response to care:  The family thanked the CSW.  Patient/Family's Understanding of and Emotional Response to Diagnosis, Current  Treatment, and Prognosis:  The family is aware of the discharge plan and is in agreement.  Emotional Assessment Appearance:  Appears stated age Attitude/Demeanor/Rapport:  (Pleasant) Affect (typically observed):  Appropriate, Stable, Pleasant Orientation:  Oriented to Self, Oriented to Place, Oriented to  Time, Oriented to Situation Alcohol / Substance use:  Never Used Psych involvement (Current and /or in the community):  No (Comment)  Discharge Needs  Concerns to be addressed:  Care Coordination, Discharge Planning Concerns Readmission within the last 30 days:  Yes Current discharge risk:  Chronically ill Barriers to Discharge:  No Barriers Identified   Judi CongKaren M Sandip Power, LCSW 12/29/2017, 10:56 AM

## 2017-12-29 NOTE — Progress Notes (Signed)
Pt slept well through out the night waking on to urinate,  with no c/o pain, SOB or acute distress noted. Nephrostomy tube continues in place draining amber urine. Nephrostomy tube flushed. Pt. continues on 4LNC.

## 2017-12-30 ENCOUNTER — Encounter: Payer: Self-pay | Admitting: Emergency Medicine

## 2017-12-30 ENCOUNTER — Emergency Department: Payer: Medicare Other

## 2017-12-30 ENCOUNTER — Other Ambulatory Visit: Payer: Self-pay

## 2017-12-30 ENCOUNTER — Inpatient Hospital Stay
Admission: EM | Admit: 2017-12-30 | Discharge: 2018-01-07 | DRG: 291 | Disposition: A | Discharge status: Skilled Nursing Facility | Payer: Medicare Other | Attending: Internal Medicine | Admitting: Internal Medicine

## 2017-12-30 DIAGNOSIS — Z7982 Long term (current) use of aspirin: Secondary | ICD-10-CM | POA: Diagnosis not present

## 2017-12-30 DIAGNOSIS — I272 Pulmonary hypertension, unspecified: Secondary | ICD-10-CM | POA: Diagnosis present

## 2017-12-30 DIAGNOSIS — I4510 Unspecified right bundle-branch block: Secondary | ICD-10-CM | POA: Diagnosis present

## 2017-12-30 DIAGNOSIS — E1122 Type 2 diabetes mellitus with diabetic chronic kidney disease: Secondary | ICD-10-CM | POA: Diagnosis present

## 2017-12-30 DIAGNOSIS — F17210 Nicotine dependence, cigarettes, uncomplicated: Secondary | ICD-10-CM | POA: Diagnosis present

## 2017-12-30 DIAGNOSIS — Z86718 Personal history of other venous thrombosis and embolism: Secondary | ICD-10-CM | POA: Diagnosis not present

## 2017-12-30 DIAGNOSIS — I5033 Acute on chronic diastolic (congestive) heart failure: Secondary | ICD-10-CM | POA: Diagnosis present

## 2017-12-30 DIAGNOSIS — Z794 Long term (current) use of insulin: Secondary | ICD-10-CM

## 2017-12-30 DIAGNOSIS — I4891 Unspecified atrial fibrillation: Secondary | ICD-10-CM

## 2017-12-30 DIAGNOSIS — N183 Chronic kidney disease, stage 3 (moderate): Secondary | ICD-10-CM | POA: Diagnosis present

## 2017-12-30 DIAGNOSIS — N390 Urinary tract infection, site not specified: Secondary | ICD-10-CM | POA: Diagnosis present

## 2017-12-30 DIAGNOSIS — J9601 Acute respiratory failure with hypoxia: Secondary | ICD-10-CM | POA: Diagnosis not present

## 2017-12-30 DIAGNOSIS — N202 Calculus of kidney with calculus of ureter: Secondary | ICD-10-CM | POA: Diagnosis present

## 2017-12-30 DIAGNOSIS — Z79899 Other long term (current) drug therapy: Secondary | ICD-10-CM | POA: Diagnosis not present

## 2017-12-30 DIAGNOSIS — Z91041 Radiographic dye allergy status: Secondary | ICD-10-CM | POA: Diagnosis not present

## 2017-12-30 DIAGNOSIS — N139 Obstructive and reflux uropathy, unspecified: Secondary | ICD-10-CM | POA: Diagnosis present

## 2017-12-30 DIAGNOSIS — I130 Hypertensive heart and chronic kidney disease with heart failure and stage 1 through stage 4 chronic kidney disease, or unspecified chronic kidney disease: Principal | ICD-10-CM | POA: Diagnosis present

## 2017-12-30 DIAGNOSIS — J811 Chronic pulmonary edema: Secondary | ICD-10-CM

## 2017-12-30 DIAGNOSIS — Z86711 Personal history of pulmonary embolism: Secondary | ICD-10-CM

## 2017-12-30 DIAGNOSIS — J449 Chronic obstructive pulmonary disease, unspecified: Secondary | ICD-10-CM | POA: Diagnosis present

## 2017-12-30 DIAGNOSIS — K219 Gastro-esophageal reflux disease without esophagitis: Secondary | ICD-10-CM | POA: Diagnosis present

## 2017-12-30 DIAGNOSIS — N179 Acute kidney failure, unspecified: Secondary | ICD-10-CM | POA: Diagnosis present

## 2017-12-30 DIAGNOSIS — I509 Heart failure, unspecified: Secondary | ICD-10-CM

## 2017-12-30 DIAGNOSIS — I482 Chronic atrial fibrillation: Secondary | ICD-10-CM | POA: Diagnosis present

## 2017-12-30 DIAGNOSIS — J9621 Acute and chronic respiratory failure with hypoxia: Secondary | ICD-10-CM | POA: Diagnosis present

## 2017-12-30 DIAGNOSIS — E7800 Pure hypercholesterolemia, unspecified: Secondary | ICD-10-CM | POA: Diagnosis present

## 2017-12-30 DIAGNOSIS — R109 Unspecified abdominal pain: Secondary | ICD-10-CM

## 2017-12-30 DIAGNOSIS — E785 Hyperlipidemia, unspecified: Secondary | ICD-10-CM | POA: Diagnosis present

## 2017-12-30 DIAGNOSIS — R609 Edema, unspecified: Secondary | ICD-10-CM

## 2017-12-30 DIAGNOSIS — E1151 Type 2 diabetes mellitus with diabetic peripheral angiopathy without gangrene: Secondary | ICD-10-CM | POA: Diagnosis present

## 2017-12-30 LAB — BASIC METABOLIC PANEL
Anion gap: 13 (ref 5–15)
BUN: 54 mg/dL — AB (ref 6–20)
CALCIUM: 9.5 mg/dL (ref 8.9–10.3)
CO2: 23 mmol/L (ref 22–32)
CREATININE: 1.87 mg/dL — AB (ref 0.61–1.24)
Chloride: 99 mmol/L — ABNORMAL LOW (ref 101–111)
GFR calc non Af Amer: 34 mL/min — ABNORMAL LOW (ref >60)
GFR, EST AFRICAN AMERICAN: 39 mL/min — AB (ref >60)
Glucose, Bld: 141 mg/dL — ABNORMAL HIGH (ref 65–99)
Potassium: 4.9 mmol/L (ref 3.5–5.1)
SODIUM: 135 mmol/L (ref 135–145)

## 2017-12-30 LAB — TROPONIN I
TROPONIN I: 0.2 ng/mL — AB (ref <0.03)
TROPONIN I: 0.18 ng/mL — AB (ref <0.03)

## 2017-12-30 LAB — URINALYSIS, COMPLETE (UACMP) WITH MICROSCOPIC
SPECIFIC GRAVITY, URINE: 1.022 (ref 1.005–1.030)
SQUAMOUS EPITHELIAL / LPF: NONE SEEN

## 2017-12-30 LAB — CBC WITH DIFFERENTIAL/PLATELET
BASOS PCT: 0 %
Basophils Absolute: 0 10*3/uL (ref 0–0.1)
Eosinophils Absolute: 0 10*3/uL (ref 0–0.7)
Eosinophils Relative: 0 %
HEMATOCRIT: 52.7 % — AB (ref 40.0–52.0)
Hemoglobin: 16.8 g/dL (ref 13.0–18.0)
LYMPHS ABS: 0.5 10*3/uL — AB (ref 1.0–3.6)
Lymphocytes Relative: 5 %
MCH: 29.4 pg (ref 26.0–34.0)
MCHC: 31.9 g/dL — AB (ref 32.0–36.0)
MCV: 92.3 fL (ref 80.0–100.0)
MONO ABS: 0.7 10*3/uL (ref 0.2–1.0)
MONOS PCT: 7 %
Neutro Abs: 8.8 10*3/uL — ABNORMAL HIGH (ref 1.4–6.5)
Neutrophils Relative %: 88 %
Platelets: 113 10*3/uL — ABNORMAL LOW (ref 150–440)
RBC: 5.71 MIL/uL (ref 4.40–5.90)
RDW: 17 % — ABNORMAL HIGH (ref 11.5–14.5)
WBC: 10 10*3/uL (ref 3.8–10.6)

## 2017-12-30 LAB — GLUCOSE, CAPILLARY: Glucose-Capillary: 116 mg/dL — ABNORMAL HIGH (ref 65–99)

## 2017-12-30 LAB — DIGOXIN LEVEL: Digoxin Level: 1.2 ng/mL (ref 0.8–2.0)

## 2017-12-30 LAB — BRAIN NATRIURETIC PEPTIDE: B Natriuretic Peptide: 1225 pg/mL — ABNORMAL HIGH (ref 0.0–100.0)

## 2017-12-30 LAB — MRSA PCR SCREENING: MRSA by PCR: NEGATIVE

## 2017-12-30 MED ORDER — INSULIN GLARGINE 100 UNIT/ML ~~LOC~~ SOLN
10.0000 [IU] | Freq: Every day | SUBCUTANEOUS | Status: DC
  Administered 2017-12-30: 10 [IU] via SUBCUTANEOUS
  Filled 2017-12-30 (×2): qty 0.1

## 2017-12-30 MED ORDER — NITROGLYCERIN 0.4 MG SL SUBL: 0.4000 mg | SUBLINGUAL_TABLET | SUBLINGUAL | Status: DC | PRN

## 2017-12-30 MED ORDER — FUROSEMIDE 10 MG/ML IJ SOLN
20.0000 mg | Freq: Two times a day (BID) | INTRAMUSCULAR | Status: DC
  Administered 2017-12-30 – 2017-12-31 (×2): 20 mg via INTRAVENOUS
  Filled 2017-12-30: qty 2

## 2017-12-30 MED ORDER — FUROSEMIDE 10 MG/ML IJ SOLN
INTRAMUSCULAR | Status: AC
  Administered 2017-12-30: 20 mg via INTRAVENOUS
  Filled 2017-12-30: qty 4

## 2017-12-30 MED ORDER — ASPIRIN EC 81 MG PO TBEC
81.0000 mg | DELAYED_RELEASE_TABLET | Freq: Every day | ORAL | Status: DC
  Administered 2017-12-31 – 2018-01-07 (×8): 81 mg via ORAL
  Filled 2017-12-30 (×9): qty 1

## 2017-12-30 MED ORDER — INSULIN ASPART 100 UNIT/ML ~~LOC~~ SOLN
0.0000 [IU] | Freq: Three times a day (TID) | SUBCUTANEOUS | Status: DC
  Administered 2017-12-31: 1 [IU] via SUBCUTANEOUS
  Administered 2017-12-31 – 2018-01-01 (×2): 2 [IU] via SUBCUTANEOUS
  Administered 2018-01-01: 3 [IU] via SUBCUTANEOUS
  Administered 2018-01-01: 1 [IU] via SUBCUTANEOUS
  Administered 2018-01-02 – 2018-01-03 (×4): 3 [IU] via SUBCUTANEOUS
  Administered 2018-01-03: 1 [IU] via SUBCUTANEOUS
  Administered 2018-01-03 – 2018-01-04 (×2): 3 [IU] via SUBCUTANEOUS
  Administered 2018-01-04: 2 [IU] via SUBCUTANEOUS
  Administered 2018-01-04: 3 [IU] via SUBCUTANEOUS
  Administered 2018-01-05 (×2): 2 [IU] via SUBCUTANEOUS
  Administered 2018-01-06: 3 [IU] via SUBCUTANEOUS
  Administered 2018-01-06: 2 [IU] via SUBCUTANEOUS
  Administered 2018-01-06 – 2018-01-07 (×2): 1 [IU] via SUBCUTANEOUS
  Administered 2018-01-07: 2 [IU] via SUBCUTANEOUS
  Filled 2017-12-30 (×22): qty 1

## 2017-12-30 MED ORDER — OXYCODONE HCL 5 MG PO TABS: 5.0000 mg | ORAL_TABLET | ORAL | Status: DC | PRN

## 2017-12-30 MED ORDER — PRAVASTATIN SODIUM 20 MG PO TABS
20.0000 mg | ORAL_TABLET | Freq: Every day | ORAL | Status: DC
  Administered 2017-12-31 – 2018-01-06 (×7): 20 mg via ORAL
  Filled 2017-12-30 (×7): qty 1

## 2017-12-30 MED ORDER — DOCUSATE SODIUM 100 MG PO CAPS: 100.0000 mg | ORAL_CAPSULE | Freq: Two times a day (BID) | ORAL | Status: DC | PRN

## 2017-12-30 MED ORDER — ORAL CARE MOUTH RINSE
15.0000 mL | Freq: Two times a day (BID) | OROMUCOSAL | Status: DC
  Administered 2018-01-02 – 2018-01-07 (×4): 15 mL via OROMUCOSAL

## 2017-12-30 MED ORDER — DIGOXIN 125 MCG PO TABS
0.1250 mg | ORAL_TABLET | Freq: Every day | ORAL | Status: DC
  Administered 2017-12-31 – 2018-01-07 (×8): 0.125 mg via ORAL
  Filled 2017-12-30 (×8): qty 1

## 2017-12-30 MED ORDER — NICOTINE 14 MG/24HR TD PT24
14.0000 mg | MEDICATED_PATCH | Freq: Every day | TRANSDERMAL | Status: DC
  Administered 2017-12-31 – 2018-01-07 (×8): 14 mg via TRANSDERMAL
  Filled 2017-12-30 (×9): qty 1

## 2017-12-30 MED ORDER — CHLORHEXIDINE GLUCONATE 0.12 % MT SOLN
15.0000 mL | Freq: Two times a day (BID) | OROMUCOSAL | Status: DC
  Administered 2017-12-30 – 2018-01-07 (×14): 15 mL via OROMUCOSAL
  Filled 2017-12-30 (×13): qty 15

## 2017-12-30 MED ORDER — DILTIAZEM LOAD VIA INFUSION
10.0000 mg | Freq: Once | INTRAVENOUS | Status: AC
  Administered 2017-12-30: 10 mg via INTRAVENOUS
  Filled 2017-12-30: qty 10

## 2017-12-30 MED ORDER — IPRATROPIUM-ALBUTEROL 0.5-2.5 (3) MG/3ML IN SOLN
3.0000 mL | Freq: Four times a day (QID) | RESPIRATORY_TRACT | Status: DC
  Administered 2017-12-30 – 2018-01-02 (×13): 3 mL via RESPIRATORY_TRACT
  Filled 2017-12-30 (×13): qty 3

## 2017-12-30 MED ORDER — PANTOPRAZOLE SODIUM 40 MG PO TBEC
40.0000 mg | DELAYED_RELEASE_TABLET | Freq: Every day | ORAL | Status: DC
  Administered 2017-12-31 – 2018-01-07 (×8): 40 mg via ORAL
  Filled 2017-12-30 (×8): qty 1

## 2017-12-30 MED ORDER — DILTIAZEM HCL 100 MG IV SOLR
5.0000 mg/h | INTRAVENOUS | Status: DC
  Administered 2017-12-30: 5 mg/h via INTRAVENOUS
  Filled 2017-12-30: qty 100

## 2017-12-30 MED ORDER — HEPARIN SODIUM (PORCINE) 5000 UNIT/ML IJ SOLN
5000.0000 [IU] | Freq: Three times a day (TID) | INTRAMUSCULAR | Status: DC
  Administered 2017-12-30 – 2018-01-01 (×5): 5000 [IU] via SUBCUTANEOUS
  Filled 2017-12-30 (×5): qty 1

## 2017-12-30 MED ORDER — POLYETHYLENE GLYCOL 3350 17 G PO PACK: 17.0000 g | PACK | Freq: Every day | ORAL | Status: DC | PRN

## 2017-12-30 NOTE — Consult Note (Signed)
PULMONARY / CRITICAL CARE MEDICINE   Name: Austin [name] MRN: [id number] DOB: 12-25-1940    ADMISSION DATE:  12/30/2017   CONSULTATION DATE: 01/20/ 2019  REFERRING MD:  Dr. Elisabeth Pigeon  Reason: A. 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  During hospitalization his diuretic, diltiazem and digoxin were discontinued in light of renal failure and sepsis with hypotension secondary to obstructive uropathy and UTI.  Today patient presented to the ED with complaints of worsening lower extremity edema and shortness of breath.  Upon EMS arrival, patient was on 4 L of oxygen via nasal cannula and his SPO2 was between 84 and 85%.  At the ED, his workup showed an increase in his proBNP from 618 to 1225.  His heart rate was also elevated and his EKG showed A. fib with RVR.  He was started on a diltiazem infusion.  He is being admitted to the ICU for further management.  PAST MEDICAL HISTORY :  He  has a past medical history of Acute lower UTI (07/16/2017), Asthma, Atrial fibrillation (HCC), CHF (congestive heart failure) (HCC), Chronic atrial fibrillation (HCC), Chronic right-sided CHF (congestive heart failure) (HCC) (07/21/2017), COPD (chronic obstructive pulmonary disease) (HCC), Diabetes mellitus without complication (HCC), DVT (deep venous thrombosis) (HCC), Gastric ulcer, GERD (gastroesophageal reflux disease), Hypercholesteremia, Hypertension, Moderate to severe pulmonary hypertension (HCC)--per 2-D echo 07/14/2017 (07/15/2017), PE (pulmonary embolism), and Sepsis due to undetermined organism with acute respiratory failure  (HCC) (07/13/2017).  PAST SURGICAL HISTORY: He  has a past surgical history that includes Hernia repair; Prostate surgery; Cardiac catheterization (N/A, 11/03/2015); Cardiac catheterization (N/A, 07/24/2016); and IR NEPHROSTOMY PLACEMENT LEFT (12/26/2017).  Allergies  Allergen Reactions  . Contrast Media [Iodinated Diagnostic Agents] Nausea And Vomiting  . Other Other (See Comments)    Cannot have "high doses of anesthesia because of his lungs"    No current facility-administered medications on file prior to encounter.    Current Outpatient Medications on File Prior to Encounter  Medication Sig  . albuterol (PROAIR HFA) 108 (90 Base) MCG/ACT inhaler Inhale 1-2 puffs into the lungs every 6 (six) hours as needed for wheezing or shortness of breath.  Marland Kitchen aspirin EC 81 MG tablet Take 81 mg by mouth daily.  . budesonide-formoterol (SYMBICORT) 160-4.5 MCG/ACT inhaler Inhale 1 puff into the lungs 2 (two) times daily.  . digoxin (LANOXIN) 0.125 MG tablet Take 1 tablet (0.125 mg total) by mouth daily.  Marland Kitchen glipiZIDE (GLUCOTROL) 5 MG tablet Take 1 tablet (5 mg total) by mouth 2 (two) times daily. DO NOT TAKE THIS DIABETES MEDICINE IF YOUR BLOOD SUGAR IS LESS THAN 130.  Marland Kitchen guaiFENesin-dextromethorphan (ROBITUSSIN DM) 100-10 MG/5ML syrup Take 10 mLs by mouth every 6 (six) hours as needed for cough.  . insulin aspart (NOVOLOG) 100 UNIT/ML injection Inject 0-5 Units into the skin at bedtime. CBG < 70: implement hypoglycemia protocol CBG 70 - 120: 0 units CBG 121 - 150: 0 units CBG 151 - 200: 0 units CBG 201 - 250: 2 units CBG 251 - 300: 3 units CBG 301 - 350: 4 units CBG 351 - 400: 5 units CBG > 400: call MD and obtain STAT lab verification  . insulin aspart (  NOVOLOG) 100 UNIT/ML injection Inject 0-15 Units into the skin 3 (three) times daily with meals. CBG < 70: implement hypoglycemia protocol CBG 70 - 120: 0 units CBG 121 - 150: 2 units CBG 151 - 200: 3 units CBG 201 - 250: 5 units CBG 251 - 300: 8  units CBG 301 - 350: 11 units CBG 351 - 400: 15 units CBG > 400: call MD and obtain STAT lab verification  . insulin glargine (LANTUS) 100 UNIT/ML injection Inject 0.1 mLs (10 Units total) into the skin daily.  Marland Kitchen ipratropium-albuterol (DUONEB) 0.5-2.5 (3) MG/3ML SOLN Take 3 mLs by nebulization every 6 (six) hours.  Marland Kitchen lactulose (CHRONULAC) 10 GM/15ML solution Take 10 g by mouth 2 (two) times daily.  Marland Kitchen lovastatin (MEVACOR) 20 MG tablet Take 20 mg by mouth at bedtime. Pt should take with food.   . mupirocin cream (BACTROBAN) 2 % Apply topically 2 (two) times daily.  . nicotine (NICODERM CQ - DOSED IN MG/24 HOURS) 14 mg/24hr patch Place 1 patch (14 mg total) onto the skin daily.  . nitroGLYCERIN (NITROSTAT) 0.4 MG SL tablet Place 0.4 mg under the tongue every 5 (five) minutes x 3 doses as needed for chest pain. If no relief call 911 or go to emergency room.  Marland Kitchen omeprazole (PRILOSEC) 40 MG capsule Take 40 mg by mouth daily.  Marland Kitchen oxyCODONE (OXY IR/ROXICODONE) 5 MG immediate release tablet Take 1 tablet (5 mg total) by mouth every 4 (four) hours as needed for moderate pain.  . polyethylene glycol (MIRALAX) packet Take 17 g by mouth daily as needed for moderate constipation.    FAMILY HISTORY:  His indicated that his mother is deceased. He indicated that his father is deceased. He indicated that the status of his brother is unknown.   SOCIAL HISTORY: He  reports that he has been smoking cigarettes.  He has a 45.00 pack-year smoking history. he has never used smokeless tobacco. He reports that he does not drink alcohol or use drugs.  REVIEW OF SYSTEMS:   Unable to obtain as patient is on continuous BiPAP  SUBJECTIVE:   VITAL SIGNS: BP 131/89 (BP Location: Right Arm)   Pulse (!) 57   Temp 97.9 F (36.6 C) (Axillary)   Resp (!) 27   Ht 6' (1.829 m)   Wt 90.7 kg (200 lb)   SpO2 100%   BMI 27.12 kg/m   HEMODYNAMICS:    VENTILATOR SETTINGS: FiO2 (%):  [30 %-40 %] 40 %  INTAKE /  OUTPUT: No intake/output data recorded.  PHYSICAL EXAMINATION: General: Chronically ill looking Neuro: Alert and oriented x2, follows commands, moves all extremities HEENT: PERRLA, mild JVD, neck is supple with good range of motion Cardiovascular: Apical pulse irregular irregular, S1-S2, no murmur regurg or gallop, +2 pulses in bilateral upper extremities, +1 in bilateral lower extremities Lungs: Normal work of breathing, bilateral breath sounds without any wheezes or rhonchi, significantly diminished in the bases Abdomen: Distended with nephrostomy tube in place, normal bowel sounds Musculoskeletal: No joint deformities, positive range of motion Skin: Warm and dry, severe venous stasis discoloration in bilateral lower extremities  LABS:  BMET Recent Labs  Lab 12/28/17 0624 12/29/17 0428 12/30/17 1831  NA 136 135 135  K 5.2* 5.1 4.9  CL 101 101 99*  CO2 24 24 23   BUN 56* 55* 54*  CREATININE 2.01* 1.92* 1.87*  GLUCOSE 129* 142* 141*    Electrolytes Recent Labs  Lab 12/28/17 0624 12/29/17 0428 12/30/17 1831  CALCIUM 9.1  9.0 9.5    CBC Recent Labs  Lab 12/27/17 0333 12/28/17 0624 12/30/17 1831  WBC 14.2* 9.8 10.0  HGB 14.4 14.8 16.8  HCT 45.9 46.4 52.7*  PLT 107* 101* 113*    Coag's Recent Labs  Lab 12/26/17 0842  INR 1.31    Sepsis Markers Recent Labs  Lab 12/26/17 0955 12/26/17 1231  LATICACIDVEN 2.2* 2.3*    ABG No results for input(s): PHART, PCO2ART, PO2ART in the last 168 hours.  Liver Enzymes Recent Labs  Lab 12/26/17 0842  AST 28  ALT 20  ALKPHOS 90  BILITOT 1.8*  ALBUMIN 3.4*    Cardiac Enzymes Recent Labs  Lab 12/30/17 1831 12/30/17 2010  TROPONINI 0.20* 0.18*    Glucose Recent Labs  Lab 12/28/17 0802 12/28/17 1148 12/28/17 1634 12/28/17 2103 12/29/17 0825 12/30/17 2112  GLUCAP 124* 160* 190* 144* 145* 116*    Imaging Dg Chest 1 View  Result Date: 12/30/2017 CLINICAL DATA:  Pitting edema. EXAM: CHEST 1 VIEW  COMPARISON:  12/26/2017 FINDINGS: Cardiomediastinal silhouette is enlarged. Calcific atherosclerotic disease of the aorta. Mediastinal contours appear intact. There is no evidence of pneumothorax. Bilateral pleural effusions. Bibasilar atelectasis versus airspace consolidation. Probable interstitial pulmonary edema superimposed on emphysematous changes. Osseous structures are without acute abnormality. Soft tissues are grossly normal. IMPRESSION: Bilateral pleural effusions with interstitial pulmonary edema superimposed on chronic emphysematous changes. Bibasilar atelectasis versus airspace consolidation. Enlarged cardiac silhouette. Electronically Signed   By: Ted Mcalpineobrinka  Dimitrova M.D.   On: 12/30/2017 19:09   Dg Abdomen 1 View  Result Date: 12/30/2017 CLINICAL DATA:  Bilateral edema. EXAM: ABDOMEN - 1 VIEW COMPARISON:  December 27, 2017 FINDINGS: A left-sided catheter, consistent with a nephrostomy tube is identified. Left renal stones noted. A large stone in the left ureter is again noted. Air-filled loops of large and small bowel are again identified, similar in the interval. No free air, portal venous gas, or pneumatosis. There may be mild patchy opacity in the right mid lung. IMPRESSION: 1. Persistent air-filled dilated loops of large and small bowel. This could be seen with colonic obstruction or ileus. 2. Left nephrostomy tube.  Left ureteral stone. 3. Possible mild patchy opacity in the right mid lung, incompletely evaluated. Electronically Signed   By: Gerome Samavid  Williams III M.D   On: 12/30/2017 19:09   SIGNIFICANT EVENTS: 01/19 Discharged 01/20 Re-admitted  LINES/TUBES: PIVs Nephrostomy tube  DISCUSSION: 77 year old male with multiple comorbidities admitted with acute CHF exacerbation, acute hypoxic respiratory failure and A. fib with RVR necessitating IV diltiazem  ASSESSMENT Afib with RVR Acute CHF exacerbation Acute hypoxic respiratory failure Recent obstructive uropathy s/p left  ureteral stent and nephrostomy H/O HTN, COPD, T2DM, GERD, pulmonary hypertension, and PVD Current tobacco use   PLAN Continuous BiPAP and titrate to nasal cannula as tolerated Nebulized bronchodilators Continue diltiazem drip and transition to oral diltiazem Resume digoxin IV diuresis Nebulized bronchodilators Monitor and correct electrolytes Blood glucose monitoring with sliding scale insulin coverage GI and DVT prophylaxis Smoking cessation offered; continue nicotine patch FAMILY  - Updates: No family at bedside. Will update when available.   - Inter-disciplinary family meet or Palliative Care meeting due by:  day 7   Doyl Bitting [facility] ANP-BC Pulmonary and Critical Care Medicine [facility] Pager [phone] or [phone]  NB: This document was prepared using Dragon voice recognition software and may include unintentional dictation errors.    12/30/2017, 11:32 PM

## 2017-12-30 NOTE — ED Provider Notes (Signed)
[facility] Emergency Department Provider Note  ____________________________________________   First MD Initiated Contact with Patient 12/30/17 1833     (approximate)  I have reviewed the triage vital signs and the nursing notes.   HISTORY  Chief Complaint Respiratory Distress   HPI Austin [name] is a 77 y.o. male with a history of asthma, A. fib, CHF, COPD, DVT and recent admission for kidney stones status post nephrostomy and bowel obstruction from left inguinal hernia who is presenting to the emergency department with shortness of breath throughout the course of the day today.  Per EMS, he has been requiring 4 L of nasal cannula oxygen which is increased from his baseline.  Despite this, the patient has only been satting 84-85% on this at his skilled nursing facility.  The patient is denying any chest pain.  Reports worsening swelling to his bilateral lower extremities.  Patient recently had his Lasix DC'd upon discharge during his last admission.  Was transported on CPAP which the patient tolerated well.   Past Medical History:  Diagnosis Date  . Acute lower UTI 07/16/2017  . Asthma   . Atrial fibrillation (HCC)   . CHF (congestive heart failure) (HCC)   . Chronic atrial fibrillation (HCC)   . 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 COPD (chronic obstructive pulmonary disease) (HCC)   . Diabetes mellitus without complication (HCC)   . DVT (deep venous thrombosis) (HCC)   . Gastric ulcer   . GERD (gastroesophageal reflux disease)   . Hypercholesteremia   . Hypertension   . Moderate to severe pulmonary hypertension (HCC)--per 2-D echo 07/14/2017 07/15/2017  . PE (pulmonary embolism)   . Sepsis due to undetermined organism with acute respiratory failure (HCC) 07/13/2017    Patient Active Problem List   Diagnosis Date Noted  . Ileus (HCC)   . Scrotal hernia   . Obstructive uropathy 12/26/2017  . Hydronephrosis   .  Atrial fibrillation (HCC) 11/08/2017  . Atrial fibrillation and flutter (HCC) 11/06/2017  . 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 Acute on chronic diastolic CHF (congestive heart failure) (HCC)   . Chronic atrial fibrillation (HCC)   . Acute lower UTI 07/16/2017  . Sepsis (HCC)   . Acute respiratory failure with hypoxia (HCC) 07/15/2017  . Moderate to severe pulmonary hypertension (HCC)--per 2-D echo 07/14/2017 07/15/2017  . Mass of lung 07/15/2017  . Ascending aortic aneurysm (HCC) 07/14/2017  . Elevated bilirubin 07/14/2017  . Kidney stone 07/14/2017  . Lactic acidosis 07/14/2017  . Sepsis due to undetermined organism with acute respiratory failure (HCC) 07/13/2017  . COPD exacerbation (HCC) 12/26/2016  . Hypoxia 11/03/2015  . COPD (chronic obstructive pulmonary disease) (HCC) 11/03/2015  . DVT of popliteal vein (HCC) 11/03/2015  . Chronic systolic CHF (congestive heart failure) (HCC) 11/03/2015  . Essential hypertension 11/03/2015  . Diabetes mellitus (HCC) 11/03/2015  . Continuous tobacco abuse 11/03/2015  . Chronic pulmonary embolism (HCC) 11/03/2015  . 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 Hemoptysis 10/31/2015  . Elevated troponin 09/16/2015  . SOB (shortness of breath) 08/24/2015  . Pulmonary embolism (HCC) 08/24/2015  . 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 Type 2 diabetes mellitus (HCC) 08/16/2015  . HLD (hyperlipidemia) 08/16/2015  . GERD (gastroesophageal reflux disease) 08/16/2015  . Chest pain 08/16/2015  . Constipation 08/16/2015  . HTN (hypertension) 07/21/2015  . COPD (chronic obstructive pulmonary disease) with chronic bronchitis (  HCC) 07/21/2015  . CHF (NYHA class IV, ACC/AHA stage D) (HCC) 07/04/2015  . CHF (congestive heart failure) (HCC) 07/04/2015    Past Surgical History:  Procedure Laterality Date  . HERNIA REPAIR    . IR NEPHROSTOMY PLACEMENT  LEFT  12/26/2017  . PERIPHERAL VASCULAR CATHETERIZATION N/A 11/03/2015   Procedure: IVC Filter Insertion;  Surgeon: Annice Needy, MD;  Location: [facility] INVASIVE CV LAB;  Service: Cardiovascular;  Laterality: N/A;  . PERIPHERAL VASCULAR CATHETERIZATION N/A 07/24/2016   Procedure: IVC Filter Removal;  Surgeon: Annice Needy, MD;  Location: [facility] INVASIVE CV LAB;  Service: Cardiovascular;  Laterality: N/A;  . PROSTATE SURGERY      Prior to Admission medications   Medication Sig Start Date End Date Taking? Authorizing Provider  albuterol (PROAIR HFA) 108 (90 Base) MCG/ACT inhaler Inhale 1-2 puffs into the lungs every 6 (six) hours as needed for wheezing or shortness of breath.    [provider]  aspirin EC 81 MG tablet Take 81 mg by mouth daily.    [provider]  budesonide-formoterol (SYMBICORT) 160-4.5 MCG/ACT inhaler Inhale 1 puff into the lungs 2 (two) times daily. 07/21/17   Elliot Cousin, MD  digoxin (LANOXIN) 0.125 MG tablet Take 1 tablet (0.125 mg total) by mouth daily. 11/09/17   Delfino Lovett, MD  glipiZIDE (GLUCOTROL) 5 MG tablet Take 1 tablet (5 mg total) by mouth 2 (two) times daily. DO NOT TAKE THIS DIABETES MEDICINE IF YOUR BLOOD SUGAR IS LESS THAN 130. 07/21/17   Elliot Cousin, MD  guaiFENesin-dextromethorphan (ROBITUSSIN DM) 100-10 MG/5ML syrup Take 10 mLs by mouth every 6 (six) hours as needed for cough. 12/13/17   Gouru, Deanna Artis, MD  insulin aspart (NOVOLOG) 100 UNIT/ML injection Inject 0-5 Units into the skin at bedtime. CBG < 70: implement hypoglycemia protocol CBG 70 - 120: 0 units CBG 121 - 150: 0 units CBG 151 - 200: 0 units CBG 201 - 250: 2 units CBG 251 - 300: 3 units CBG 301 - 350: 4 units CBG 351 - 400: 5 units CBG > 400: call MD and obtain STAT lab verification 12/13/17   Gouru, Deanna Artis, MD  insulin aspart (NOVOLOG) 100 UNIT/ML injection Inject 0-15 Units into the skin 3 (three) times daily with meals. CBG < 70: implement hypoglycemia protocol CBG 70 - 120: 0  units CBG 121 - 150: 2 units CBG 151 - 200: 3 units CBG 201 - 250: 5 units CBG 251 - 300: 8 units CBG 301 - 350: 11 units CBG 351 - 400: 15 units CBG > 400: call MD and obtain STAT lab verification 12/13/17   Gouru, Deanna Artis, MD  insulin glargine (LANTUS) 100 UNIT/ML injection Inject 0.1 mLs (10 Units total) into the skin daily. 12/13/17   Gouru, Deanna Artis, MD  ipratropium-albuterol (DUONEB) 0.5-2.5 (3) MG/3ML SOLN Take 3 mLs by nebulization every 6 (six) hours. 12/13/17   Gouru, Deanna Artis, MD  lactulose (CHRONULAC) 10 GM/15ML solution Take 10 g by mouth 2 (two) times daily.    [provider]  lovastatin (MEVACOR) 20 MG tablet Take 20 mg by mouth at bedtime. Pt should take with food.     [provider]  mupirocin cream (BACTROBAN) 2 % Apply topically 2 (two) times daily. 12/13/17   Gouru, Deanna Artis, MD  nicotine (NICODERM CQ - DOSED IN MG/24 HOURS) 14 mg/24hr patch Place 1 patch (14 mg total) onto the skin daily. Patient not taking: Reported on 12/26/2017 12/14/17   Ramonita Lab, MD  nitroGLYCERIN (NITROSTAT) 0.4  MG SL tablet Place 0.4 mg under the tongue every 5 (five) minutes x 3 doses as needed for chest pain. If no relief call 911 or go to emergency room.    [provider]  omeprazole (PRILOSEC) 40 MG capsule Take 40 mg by mouth daily.    [provider]  oxyCODONE (OXY IR/ROXICODONE) 5 MG immediate release tablet Take 1 tablet (5 mg total) by mouth every 4 (four) hours as needed for moderate pain. Patient not taking: Reported on 12/26/2017 12/13/17   Ramonita Lab, MD  polyethylene glycol [facility]) packet Take 17 g by mouth daily as needed for moderate constipation. 08/17/15   Delfino Lovett, MD    Allergies Contrast media [iodinated diagnostic agents] and Other  Family History  Problem Relation Age of Onset  . CAD Brother   . Congestive Heart Failure Brother     Social History Social History   Tobacco Use  . Smoking status: Current Every Day Smoker    Packs/day: 1.00     Years: 45.00    Pack years: 45.00    Types: Cigarettes  . Smokeless tobacco: Never Used  Substance Use Topics  . Alcohol use: No  . Drug use: No    Review of Systems  Constitutional: No fever/chills Eyes: No visual changes. ENT: No sore throat. Cardiovascular: Denies chest pain. Respiratory: As above Gastrointestinal: No abdominal pain.  No nausea, no vomiting.  No diarrhea.  No constipation. Genitourinary: Negative for dysuria. Musculoskeletal: Negative for back pain. Skin: Negative for rash. Neurological: Negative for headaches, focal weakness or numbness.   ____________________________________________   PHYSICAL EXAM:  VITAL SIGNS: ED Triage Vitals  Enc Vitals Group     BP 12/30/17 1839 116/85     Pulse Rate 12/30/17 1839 (!) 126     Resp 12/30/17 1839 (!) 33     Temp 12/30/17 1839 97.7 F (36.5 C)     Temp Source 12/30/17 1839 Oral     SpO2 12/30/17 1839 98 %     Weight 12/30/17 1844 200 lb (90.7 kg)     Height 12/30/17 1844 6' (1.829 m)     Head Circumference --      Peak Flow --      Pain Score 12/30/17 1837 0     Pain Loc --      Pain Edu? --      Excl. in GC? --     Constitutional: Patient wearing BiPAP and is alert and oriented and answering questions appropriately.  However, is only able to answer 2-3 word answers. Eyes: Conjunctivae are normal.  Head: Atraumatic. Nose: No congestion/rhinnorhea. Mouth/Throat: Mucous membranes are moist.  Neck: No stridor.   Cardiovascular: Tachycardic with an irregularly irregular rhythm. Grossly normal heart sounds.  Respiratory: Increased respiratory effort with tachypnea.  Decreased breath sounds at the bilateral bases.  However, no overt rhonchi or rales auscultated. Gastrointestinal: Soft and nontender. No distention.   Left-sided nephrostomy with bandages CDI and yellow urine in the bag without sedimentation.  No hernias visualized or palpated.  Soft inguinal regions bilaterally without any tenderness or  masses.  Musculoskeletal: Moderate to severe bilateral lower extremity edema without induration. Neurologic:  Normal speech and language. No gross focal neurologic deficits are appreciated. Skin:  Skin is warm, dry    ____________________________________________   LABS (all labs ordered are listed, but only abnormal results are displayed)  Labs Reviewed  CBC WITH DIFFERENTIAL/PLATELET - Abnormal; Notable for the following components:      Result  Value   HCT 52.7 (*)    MCHC 31.9 (*)    RDW 17.0 (*)    Platelets 113 (*)    Neutro Abs 8.8 (*)    Lymphs Abs 0.5 (*)    All other components within normal limits  BASIC METABOLIC PANEL - Abnormal; Notable for the following components:   Chloride 99 (*)    Glucose, Bld 141 (*)    BUN 54 (*)    Creatinine, Ser 1.87 (*)    GFR calc non Af Amer 34 (*)    GFR calc Af Amer 39 (*)    All other components within normal limits  BRAIN NATRIURETIC PEPTIDE - Abnormal; Notable for the following components:   B Natriuretic Peptide 1,225.0 (*)    All other components within normal limits  TROPONIN I - Abnormal; Notable for the following components:   Troponin I 0.20 (*)    All other components within normal limits  DIGOXIN LEVEL   ____________________________________________  EKG  ED ECG REPORT I, Arelia Longest, the attending physician, personally viewed and interpreted this ECG.   Date: 12/30/2017  EKG Time: 1846  Rate: 142  Rhythm: atrial fibrillation, rate 142  Axis: Normal  Intervals:right bundle branch block  ST&T Change: T wave inversions in V2 through 4.  No ST elevations or depressions. Patient with previous right bundle branch block and there do not appear to be any significant changes in the EKG except for the rate when compared with the last EKG of December 10, 2017. ____________________________________________  RADIOLOGY  Pulmonary edema with effusions on the chest x-ray.  Persistent air-filled dilated loops of  large and small bowel on the abdominal x-ray.  Could be seen with colonic obstruction or ileus. ____________________________________________   PROCEDURES  Procedure(s) performed:   .Critical Care Performed by: Myrna Blazer, MD Authorized by: Myrna Blazer, MD   Critical care provider statement:    Critical care time (minutes):  35   Critical care time was exclusive of:  Separately billable procedures and treating other patients   Critical care was necessary to treat or prevent imminent or life-threatening deterioration of the following conditions:  Respiratory failure   Critical care was time spent personally by me on the following activities:  Evaluation of patient's response to treatment, examination of patient, obtaining history from patient or surrogate, review of old charts, pulse oximetry, ordering and review of radiographic studies, ordering and performing treatments and interventions and ordering and review of laboratory studies    Critical Care performed:   ____________________________________________   INITIAL IMPRESSION / ASSESSMENT AND PLAN / ED COURSE  Pertinent labs & imaging results that were available during my care of the patient were reviewed by me and considered in my medical decision making (see chart for details).  Differential includes, but is not limited to, viral syndrome, bronchitis including COPD exacerbation, pneumonia, reactive airway disease including asthma, CHF including exacerbation with or without pulmonary/interstitial edema, pneumothorax, ACS, thoracic trauma, and pulmonary embolism. As part of my medical decision making, I reviewed the following data within the electronic MEDICAL RECORD NUMBER Old chart reviewed and Notes from prior ED visits  ----------------------------------------- 7:24 PM on 12/30/2017 -----------------------------------------  Patient tolerating the BiPAP well.  On Cardizem drip the patient's heart rate  is now down to 110 with his systolic blood pressure still in the 1 teens.  I favor ileus as there does not appear to be a hernia or any tenderness to the abdomen.  Patient to  be admitted to the hospital.  Patient aware.  Signed out to Dr. Elisabeth Pigeon.  Patient's CHF likely combination of discontinuing his Lasix as well as A. fib with RVR.      ____________________________________________   FINAL CLINICAL IMPRESSION(S) / ED DIAGNOSES  A. fib with RVR.  Peripheral edema.  CHF.    NEW MEDICATIONS STARTED DURING THIS VISIT:  New Prescriptions   No medications on file     Note:  This document was prepared using Dragon voice recognition software and may include unintentional dictation errors.     Myrna Blazer, MD 12/30/17 [phone]

## 2017-12-30 NOTE — ED Notes (Signed)
Admitting MD at bedside.

## 2017-12-30 NOTE — ED Triage Notes (Signed)
[facility] Pt was discharged from here yesterday and reportedly taken off lasix. Pt has bilateral 2+ pitting edema. Currently denying pain.

## 2017-12-30 NOTE — H&P (Signed)
[employer] - [place] at [facility]   PATIENT NAME: Austin [name]    MR#:  [id number]  DATE OF BIRTH:  09-07-1941  DATE OF ADMISSION:  12/30/2017  PRIMARY CARE PHYSICIAN: [facility]   REQUESTING/REFERRING PHYSICIAN: [place]  CHIEF COMPLAINT:   Chief Complaint  Patient presents with  . Respiratory Distress    HISTORY OF PRESENT ILLNESS: Austin [name]  is a 77 y.o. male with a known history of CHF- EF 50% in recent echo, COPD, Ch A fib, Htn, HLD, PE, recent admissions twice- first for CHF and then for renal failure due to Ureteral stone- s/p nephrostomy tube and d/c yesterday to rehab.  During last admission was stopped lasix and cardizem due to renal and Htn issues. Renal func improved, sent back to NH. Was very SOB, so sent back to ER today. At baseline pt is on 3 ltr oxygen. Required Bipap in ER due to CHF, have A fib with RVR and on cardizem drip. Some lethargic and wife is giving history in room.  PAST MEDICAL HISTORY:   Past Medical History:  Diagnosis Date  . Acute lower UTI 07/16/2017  . Asthma   . Atrial fibrillation (HCC)   . CHF (congestive heart failure) (HCC)   . Chronic atrial fibrillation (HCC)   . 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 COPD (chronic obstructive pulmonary disease) (HCC)   . Diabetes mellitus without complication (HCC)   . DVT (deep venous thrombosis) (HCC)   . Gastric ulcer   . GERD (gastroesophageal reflux disease)   . Hypercholesteremia   . Hypertension   . Moderate to severe pulmonary hypertension (HCC)--per 2-D echo 07/14/2017 07/15/2017  . PE (pulmonary embolism)   . Sepsis due to undetermined organism with acute respiratory failure (HCC) 07/13/2017    PAST SURGICAL HISTORY:  Past Surgical History:  Procedure Laterality Date  . HERNIA REPAIR    . IR NEPHROSTOMY PLACEMENT LEFT  12/26/2017  . PERIPHERAL VASCULAR CATHETERIZATION N/A 11/03/2015   Procedure: IVC Filter  Insertion;  Surgeon: Annice Needy, MD;  Location: [facility] INVASIVE CV LAB;  Service: Cardiovascular;  Laterality: N/A;  . PERIPHERAL VASCULAR CATHETERIZATION N/A 07/24/2016   Procedure: IVC Filter Removal;  Surgeon: Annice Needy, MD;  Location: [facility] INVASIVE CV LAB;  Service: Cardiovascular;  Laterality: N/A;  . PROSTATE SURGERY      SOCIAL HISTORY:  Social History   Tobacco Use  . Smoking status: Current Every Day Smoker    Packs/day: 1.00    Years: 45.00    Pack years: 45.00    Types: Cigarettes  . Smokeless tobacco: Never Used  Substance Use Topics  . Alcohol use: No    FAMILY HISTORY:  Family History  Problem Relation Age of Onset  . CAD Brother   . Congestive Heart Failure Brother     DRUG ALLERGIES:  Allergies  Allergen Reactions  . Contrast Media [Iodinated Diagnostic Agents] Nausea And Vomiting  . Other Other (See Comments)    Cannot have "high doses of anesthesia because of his lungs"    REVIEW OF SYSTEMS:   CONSTITUTIONAL: No fever,positive for fatigue or weakness.  EYES: No blurred or double vision.  EARS, NOSE, AND THROAT: No tinnitus or ear pain.  RESPIRATORY: No cough,have shortness of breath, no wheezing or hemoptysis.  CARDIOVASCULAR: No chest pain, orthopnea, edema.  GASTROINTESTINAL: No nausea, vomiting, diarrhea or abdominal pain.  GENITOURINARY: No dysuria, hematuria.  ENDOCRINE: No polyuria, nocturia,  HEMATOLOGY:  No anemia, easy bruising or bleeding SKIN: No rash or lesion. MUSCULOSKELETAL: No joint pain or arthritis.   NEUROLOGIC: No tingling, numbness, weakness.  PSYCHIATRY: No anxiety or depression.   MEDICATIONS AT HOME:  Prior to Admission medications   Medication Sig Start Date End Date Taking? Authorizing Provider  albuterol (PROAIR HFA) 108 (90 Base) MCG/ACT inhaler Inhale 1-2 puffs into the lungs every 6 (six) hours as needed for wheezing or shortness of breath.   Yes [provider]  aspirin EC 81 MG tablet Take 81 mg by mouth  daily.   Yes [provider]  budesonide-formoterol (SYMBICORT) 160-4.5 MCG/ACT inhaler Inhale 1 puff into the lungs 2 (two) times daily. 07/21/17  Yes Elliot Cousin, MD  digoxin (LANOXIN) 0.125 MG tablet Take 1 tablet (0.125 mg total) by mouth daily. 11/09/17  Yes Delfino Lovett, MD  glipiZIDE (GLUCOTROL) 5 MG tablet Take 1 tablet (5 mg total) by mouth 2 (two) times daily. DO NOT TAKE THIS DIABETES MEDICINE IF YOUR BLOOD SUGAR IS LESS THAN 130. 07/21/17  Yes Elliot Cousin, MD  guaiFENesin-dextromethorphan (ROBITUSSIN DM) 100-10 MG/5ML syrup Take 10 mLs by mouth every 6 (six) hours as needed for cough. 12/13/17  Yes Gouru, Aruna, MD  insulin aspart (NOVOLOG) 100 UNIT/ML injection Inject 0-5 Units into the skin at bedtime. CBG < 70: implement hypoglycemia protocol CBG 70 - 120: 0 units CBG 121 - 150: 0 units CBG 151 - 200: 0 units CBG 201 - 250: 2 units CBG 251 - 300: 3 units CBG 301 - 350: 4 units CBG 351 - 400: 5 units CBG > 400: call MD and obtain STAT lab verification 12/13/17  Yes Gouru, Aruna, MD  insulin aspart (NOVOLOG) 100 UNIT/ML injection Inject 0-15 Units into the skin 3 (three) times daily with meals. CBG < 70: implement hypoglycemia protocol CBG 70 - 120: 0 units CBG 121 - 150: 2 units CBG 151 - 200: 3 units CBG 201 - 250: 5 units CBG 251 - 300: 8 units CBG 301 - 350: 11 units CBG 351 - 400: 15 units CBG > 400: call MD and obtain STAT lab verification 12/13/17  Yes Gouru, Aruna, MD  insulin glargine (LANTUS) 100 UNIT/ML injection Inject 0.1 mLs (10 Units total) into the skin daily. 12/13/17  Yes Gouru, Aruna, MD  ipratropium-albuterol (DUONEB) 0.5-2.5 (3) MG/3ML SOLN Take 3 mLs by nebulization every 6 (six) hours. 12/13/17  Yes Gouru, Deanna Artis, MD  lactulose (CHRONULAC) 10 GM/15ML solution Take 10 g by mouth 2 (two) times daily.   Yes [provider]  lovastatin (MEVACOR) 20 MG tablet Take 20 mg by mouth at bedtime. Pt should take with food.    Yes [provider]   mupirocin cream (BACTROBAN) 2 % Apply topically 2 (two) times daily. 12/13/17  Yes Gouru, Aruna, MD  nicotine (NICODERM CQ - DOSED IN MG/24 HOURS) 14 mg/24hr patch Place 1 patch (14 mg total) onto the skin daily. 12/14/17  Yes Gouru, Deanna Artis, MD  nitroGLYCERIN (NITROSTAT) 0.4 MG SL tablet Place 0.4 mg under the tongue every 5 (five) minutes x 3 doses as needed for chest pain. If no relief call 911 or go to emergency room.   Yes [provider]  omeprazole (PRILOSEC) 40 MG capsule Take 40 mg by mouth daily.   Yes [provider]  oxyCODONE (OXY IR/ROXICODONE) 5 MG immediate release tablet Take 1 tablet (5 mg total) by mouth every 4 (four) hours as needed for moderate pain. 12/13/17  Yes Gouru, Deanna Artis,  MD  polyethylene glycol (MIRALAX) packet Take 17 g by mouth daily as needed for moderate constipation. 08/17/15  Yes Delfino Lovett, MD      PHYSICAL EXAMINATION:   VITAL SIGNS: Blood pressure (!) 137/99, pulse (!) 39, temperature 97.7 F (36.5 C), temperature source Oral, resp. rate (!) 25, height 6' (1.829 m), weight 90.7 kg (200 lb), SpO2 98 %.  GENERAL:  77 y.o.-year-old patient lying in the bed with no acute distress.  EYES: Pupils equal, round, reactive to light and accommodation. No scleral icterus. Extraocular muscles intact.  HEENT: Head atraumatic, normocephalic. Oropharynx and nasopharynx clear.  NECK:  Supple, no jugular venous distention. No thyroid enlargement, no tenderness.  LUNGS: Normal breath sounds bilaterally, no wheezing, b/l crepitation. Positive for use of accessory muscles of respiration. On bipap now. CARDIOVASCULAR: S1, S2 normal. No murmurs, rubs, or gallops.  ABDOMEN: Soft, nontender, nondistended. Bowel sounds present. No organomegaly or mass. Left side nephrostomy tube in place. EXTREMITIES: No pedal edema, cyanosis, or clubbing.  NEUROLOGIC: Cranial nerves II through XII are intact. Muscle strength 3/5 in all extremities. Sensation intact. Gait not checked.   PSYCHIATRIC: The patient is alert but not very communicative, as looks very weak and fatigued.  SKIN: No obvious rash, lesion, or ulcer.   LABORATORY PANEL:   CBC Recent Labs  Lab 12/26/17 0842 12/27/17 0333 12/28/17 0624 12/30/17 1831  WBC 20.3* 14.2* 9.8 10.0  HGB 15.8 14.4 14.8 16.8  HCT 49.5 45.9 46.4 52.7*  PLT 126* 107* 101* 113*  MCV 92.2 92.2 92.0 92.3  MCH 29.4 29.0 29.4 29.4  MCHC 31.9* 31.4* 31.9* 31.9*  RDW 17.0* 16.7* 16.9* 17.0*  LYMPHSABS 0.2*  --   --  0.5*  MONOABS 1.2*  --   --  0.7  EOSABS 0.0  --   --  0.0  BASOSABS 0.1  --   --  0.0   ------------------------------------------------------------------------------------------------------------------  Chemistries  Recent Labs  Lab 12/26/17 0842 12/27/17 0333 12/28/17 0624 12/29/17 0428 12/30/17 1831  NA 133* 134* 136 135 135  K 4.9 5.1 5.2* 5.1 4.9  CL 97* 98* 101 101 99*  CO2 24 23 24 24 23   GLUCOSE 118* 54* 129* 142* 141*  BUN 51* 52* 56* 55* 54*  CREATININE 2.66* 2.39* 2.01* 1.92* 1.87*  CALCIUM 9.6 9.4 9.1 9.0 9.5  AST 28  --   --   --   --   ALT 20  --   --   --   --   ALKPHOS 90  --   --   --   --   BILITOT 1.8*  --   --   --   --    ------------------------------------------------------------------------------------------------------------------ estimated creatinine clearance is 37.5 mL/min (A) (by C-G formula based on SCr of 1.87 mg/dL (H)). ------------------------------------------------------------------------------------------------------------------ No results for input(s): TSH, T4TOTAL, T3FREE, THYROIDAB in the last 72 hours.  Invalid input(s): FREET3   Coagulation profile Recent Labs  Lab 12/26/17 0842  INR 1.31   ------------------------------------------------------------------------------------------------------------------- No results for input(s): DDIMER in the last 72  hours. -------------------------------------------------------------------------------------------------------------------  Cardiac Enzymes Recent Labs  Lab 12/30/17 1831  TROPONINI 0.20*   ------------------------------------------------------------------------------------------------------------------ Invalid input(s): POCBNP  ---------------------------------------------------------------------------------------------------------------  Urinalysis    Component Value Date/Time   COLORURINE YELLOW (A) 12/26/2017 1120   APPEARANCEUR CLEAR (A) 12/26/2017 1120   APPEARANCEUR Hazy 03/19/2014 2110   LABSPEC 1.014 12/26/2017 1120   LABSPEC 1.018 03/19/2014 2110   PHURINE 5.0 12/26/2017 1120   GLUCOSEU NEGATIVE 12/26/2017 1120  GLUCOSEU Negative 03/19/2014 2110   HGBUR MODERATE (A) 12/26/2017 1120   BILIRUBINUR NEGATIVE 12/26/2017 1120   BILIRUBINUR Negative 03/19/2014 2110   KETONESUR NEGATIVE 12/26/2017 1120   PROTEINUR 30 (A) 12/26/2017 1120   NITRITE NEGATIVE 12/26/2017 1120   LEUKOCYTESUR NEGATIVE 12/26/2017 1120   LEUKOCYTESUR Negative 03/19/2014 2110     RADIOLOGY: Dg Chest 1 View  Result Date: 12/30/2017 CLINICAL DATA:  Pitting edema. EXAM: CHEST 1 VIEW COMPARISON:  12/26/2017 FINDINGS: Cardiomediastinal silhouette is enlarged. Calcific atherosclerotic disease of the aorta. Mediastinal contours appear intact. There is no evidence of pneumothorax. Bilateral pleural effusions. Bibasilar atelectasis versus airspace consolidation. Probable interstitial pulmonary edema superimposed on emphysematous changes. Osseous structures are without acute abnormality. Soft tissues are grossly normal. IMPRESSION: Bilateral pleural effusions with interstitial pulmonary edema superimposed on chronic emphysematous changes. Bibasilar atelectasis versus airspace consolidation. Enlarged cardiac silhouette. Electronically Signed   By: Ted Mcalpine M.D.   On: 12/30/2017 19:09   Dg Abdomen 1  View  Result Date: 12/30/2017 CLINICAL DATA:  Bilateral edema. EXAM: ABDOMEN - 1 VIEW COMPARISON:  December 27, 2017 FINDINGS: A left-sided catheter, consistent with a nephrostomy tube is identified. Left renal stones noted. A large stone in the left ureter is again noted. Air-filled loops of large and small bowel are again identified, similar in the interval. No free air, portal venous gas, or pneumatosis. There may be mild patchy opacity in the right mid lung. IMPRESSION: 1. Persistent air-filled dilated loops of large and small bowel. This could be seen with colonic obstruction or ileus. 2. Left nephrostomy tube.  Left ureteral stone. 3. Possible mild patchy opacity in the right mid lung, incompletely evaluated. Electronically Signed   By: Gerome Sam III M.D   On: 12/30/2017 19:09    EKG: Orders placed or performed during the hospital encounter of 12/26/17  . ED EKG  . ED EKG  . EKG 12-Lead  . EKG 12-Lead    IMPRESSION AND PLAN:  * Ac respi failure on ch- with hypoxia   Due to ac on ch diastolic CHF    Iv lasix, Bipap.   Intake and output measuring.   Recent Echo last month- EF 50%  * A Fib with RVR   IV cardizem   Digoxin oral.   Not a good candidate for anticoagulant as weakness and may fall.   Cardio consult.   Pt was taken off from oral cardizem last admission.    We may resume.  * Ac on ch renal failure- CKD stage 3   Renal stone.   Resolving after nephrostomy tube   Monitor   Follow with Urology as out pt.  * DM   Cont lantus, ISS.  * COPD   Cont Duoneb, no exacerbation.  All the records are reviewed and case discussed with ED provider. Management plans discussed with the patient, family and they are in agreement.  CODE STATUS: full. Code Status History    Date Active Date Inactive Code Status Order ID Comments User Context   12/26/2017 12:16 12/29/2017 16:55 Full Code [id number]  Adrian Saran, MD Inpatient   12/07/2017 01:35 12/13/2017 18:58 Full Code  [id number]  Oralia Manis, MD Inpatient   11/06/2017 19:46 11/09/2017 14:55 Full Code [id number]  Houston Siren, MD Inpatient   07/14/2017 00:55 07/21/2017 19:12 Full Code [id number]  Bobette Mo, MD Inpatient   12/26/2016 23:05 12/27/2016 20:34 Full Code [id number]  Auburn Bilberry, MD Inpatient   10/31/2015 09:34 11/03/2015 20:11 Full Code [id number]  Sheryle Hail,  Kelton PillarMichael S, MD Inpatient   09/16/2015 01:49 09/16/2015 18:22 Full Code [id number]  Arnaldo Nataliamond, Michael S, MD ED   08/24/2015 17:42 08/27/2015 16:10 Full Code [id number]  Auburn BilberryPatel, Shreyang, MD Inpatient   08/16/2015 03:55 08/17/2015 20:22 Full Code [id number]  Oralia ManisWillis, David, MD Inpatient   07/04/2015 14:22 07/08/2015 16:04 Full Code [id number]  Altamese DillingVachhani, Darlene Bartelt, MD Inpatient      Discussed with wife about critical condition and the plan.  TOTAL TIME TAKING CARE OF THIS PATIENT: 50 critical care minutes.    Altamese DillingVaibhavkumar Elyssia Strausser M.D on 12/30/2017   Between 7am to 6pm - Pager - [phone]  After 6pm go to [email or web address] - Social research officer, governmentpassword EPAS [facility]  [facility] [place] [facility]  Office  [phone]  CC: Primary care physician; [facility]   Note: This dictation was prepared with Dragon dictation along with smaller phrase technology. Any transcriptional errors that result from this process are unintentional.

## 2017-12-30 NOTE — ED Notes (Signed)
Pt using urinal.

## 2017-12-31 ENCOUNTER — Inpatient Hospital Stay: Payer: Medicare Other

## 2017-12-31 ENCOUNTER — Inpatient Hospital Stay
Admit: 2017-12-31 | Discharge: 2017-12-31 | Disposition: A | Discharge status: Home / Self Care | Payer: Medicare Other | Attending: Registered Nurse | Admitting: Registered Nurse

## 2017-12-31 LAB — BASIC METABOLIC PANEL
Anion gap: 11 (ref 5–15)
BUN: 54 mg/dL — AB (ref 6–20)
CALCIUM: 9.3 mg/dL (ref 8.9–10.3)
CO2: 24 mmol/L (ref 22–32)
CREATININE: 1.72 mg/dL — AB (ref 0.61–1.24)
Chloride: 103 mmol/L (ref 101–111)
GFR calc Af Amer: 43 mL/min — ABNORMAL LOW (ref >60)
GFR calc non Af Amer: 37 mL/min — ABNORMAL LOW (ref >60)
GLUCOSE: 102 mg/dL — AB (ref 65–99)
Potassium: 4.9 mmol/L (ref 3.5–5.1)
Sodium: 138 mmol/L (ref 135–145)

## 2017-12-31 LAB — CBC
HCT: 46 % (ref 40.0–52.0)
HCT: 46.7 % (ref 40.0–52.0)
Hemoglobin: 14.9 g/dL (ref 13.0–18.0)
Hemoglobin: 14.9 g/dL (ref 13.0–18.0)
MCH: 29.3 pg (ref 26.0–34.0)
MCH: 29.7 pg (ref 26.0–34.0)
MCHC: 31.9 g/dL — ABNORMAL LOW (ref 32.0–36.0)
MCHC: 32.4 g/dL (ref 32.0–36.0)
MCV: 91.9 fL (ref 80.0–100.0)
MCV: 92.1 fL (ref 80.0–100.0)
PLATELETS: 104 10*3/uL — AB (ref 150–440)
PLATELETS: 110 10*3/uL — AB (ref 150–440)
RBC: 5.01 MIL/uL (ref 4.40–5.90)
RBC: 5.07 MIL/uL (ref 4.40–5.90)
RDW: 16.6 % — ABNORMAL HIGH (ref 11.5–14.5)
RDW: 17.2 % — AB (ref 11.5–14.5)
WBC: 7.7 10*3/uL (ref 3.8–10.6)
WBC: 8.8 10*3/uL (ref 3.8–10.6)

## 2017-12-31 LAB — CULTURE, BLOOD (ROUTINE X 2)
CULTURE: NO GROWTH
Culture: NO GROWTH
SPECIAL REQUESTS: ADEQUATE

## 2017-12-31 LAB — PHOSPHORUS: Phosphorus: 3.8 mg/dL (ref 2.5–4.6)

## 2017-12-31 LAB — MAGNESIUM: Magnesium: 2.3 mg/dL (ref 1.7–2.4)

## 2017-12-31 LAB — CREATININE, SERUM
Creatinine, Ser: 1.94 mg/dL — ABNORMAL HIGH (ref 0.61–1.24)
GFR, EST AFRICAN AMERICAN: 37 mL/min — AB (ref >60)
GFR, EST NON AFRICAN AMERICAN: 32 mL/min — AB (ref >60)

## 2017-12-31 LAB — ECHOCARDIOGRAM COMPLETE
Height: 72 in
Weight: 3200 oz

## 2017-12-31 LAB — GLUCOSE, CAPILLARY
GLUCOSE-CAPILLARY: 176 mg/dL — AB (ref 65–99)
GLUCOSE-CAPILLARY: 146 mg/dL — AB (ref 65–99)
GLUCOSE-CAPILLARY: 167 mg/dL — AB (ref 65–99)
Glucose-Capillary: 62 mg/dL — ABNORMAL LOW (ref 65–99)
Glucose-Capillary: 196 mg/dL — ABNORMAL HIGH (ref 65–99)

## 2017-12-31 LAB — TROPONIN I
TROPONIN I: 0.2 ng/mL — AB (ref <0.03)
Troponin I: 0.21 ng/mL (ref <0.03)

## 2017-12-31 MED ORDER — DILTIAZEM HCL 60 MG PO TABS
60.0000 mg | ORAL_TABLET | Freq: Four times a day (QID) | ORAL | Status: DC
  Administered 2017-12-31 – 2018-01-02 (×7): 60 mg via ORAL
  Filled 2017-12-31 (×7): qty 1

## 2017-12-31 MED ORDER — DILTIAZEM HCL 30 MG PO TABS
30.0000 mg | ORAL_TABLET | Freq: Once | ORAL | Status: AC
  Administered 2017-12-31: 30 mg via ORAL
  Filled 2017-12-31: qty 1

## 2017-12-31 MED ORDER — DEXTROSE 50 % IV SOLN
50.0000 mL | Freq: Once | INTRAVENOUS | Status: AC
  Administered 2017-12-31: 50 mL via INTRAVENOUS
  Filled 2017-12-31: qty 50

## 2017-12-31 MED ORDER — ENSURE ENLIVE PO LIQD
237.0000 mL | Freq: Two times a day (BID) | ORAL | Status: DC
  Administered 2017-12-31 – 2018-01-07 (×13): 237 mL via ORAL

## 2017-12-31 MED ORDER — DILTIAZEM HCL 30 MG PO TABS
30.0000 mg | ORAL_TABLET | Freq: Four times a day (QID) | ORAL | Status: DC
  Administered 2017-12-31 (×4): 30 mg via ORAL
  Filled 2017-12-31 (×4): qty 1

## 2017-12-31 MED ORDER — FUROSEMIDE 10 MG/ML IJ SOLN: 20.0000 mg | Freq: Two times a day (BID) | INTRAMUSCULAR | Status: DC

## 2017-12-31 MED ORDER — FUROSEMIDE 10 MG/ML IJ SOLN
40.0000 mg | Freq: Two times a day (BID) | INTRAMUSCULAR | Status: DC
  Administered 2017-12-31: 40 mg via INTRAVENOUS
  Filled 2017-12-31: qty 4

## 2017-12-31 MED ORDER — BUDESONIDE 0.5 MG/2ML IN SUSP
0.5000 mg | Freq: Two times a day (BID) | RESPIRATORY_TRACT | Status: DC
  Administered 2017-12-31 – 2018-01-07 (×13): 0.5 mg via RESPIRATORY_TRACT
  Filled 2017-12-31 (×14): qty 2

## 2017-12-31 MED ORDER — BUDESONIDE 0.25 MG/2ML IN SUSP
0.2500 mg | Freq: Two times a day (BID) | RESPIRATORY_TRACT | Status: DC
  Administered 2017-12-31: 0.25 mg via RESPIRATORY_TRACT
  Filled 2017-12-31: qty 2

## 2017-12-31 NOTE — Progress Notes (Signed)
Patient continues to progress. Cardizem infusing at 55ml/hr currently. A-fib on the monitor, HR 80-110's. Oxygen saturation at 100% while on bipap and FIO2 at 40%. Left nephrostomy urine output this shift is 225 ml. Gauze dressing to left nephrostomy changed this morning. First dose PO Cardizem 30 mg initiated this shift at 0048, pt tolerated [place] at 5 LPM well and swallowed medication without difficulty. Continue to monitor.

## 2017-12-31 NOTE — Progress Notes (Addendum)
Patient rested well this shift. Alert to self and situation.  Denies pain. Tolerating bipap. Left nephrostomy drained clear yellow urine. Pt denies the need to void, bladder scan shows in the bladder and pt states that he does not feel the need to void. Pt refused SCD's, education provided concerning SCD's and pt continues to refuse. Pt switched to [place] at 5LPM to take PO medications. Pt tolerated well and maintained oxygen saturations >92% while taking medications with sips and wearing [place]. Creatinine improved since admission. Pt continues to be Afib on the monitor, now more controlled rate. Cardizem IV stopped at 0429 per order, HR sustaining less than 105, PO Cardizem given per order. Cardiology consult pending for Afib/RVR.

## 2017-12-31 NOTE — Progress Notes (Signed)
*  PRELIMINARY RESULTS* Echocardiogram 2D Echocardiogram has been performed.  Austin GulaJoan M Falon [name] 12/31/2017, 12:25 PM

## 2017-12-31 NOTE — Progress Notes (Signed)
Initial Nutrition Assessment  DOCUMENTATION CODES:   Not applicable  INTERVENTION:  Provide Ensure Enlive po BID, each supplement provides 350 kcal and 20 grams of protein.  Provide Magic cup TID with meals, each supplement provides 290 kcal and 9 grams of protein.  Provide MVI daily.  Encouraged adequate intake of calories and protein from meals.  NUTRITION DIAGNOSIS:   Inadequate oral intake related to decreased appetite, constipation as evidenced by per patient/family report.  GOAL:   Patient will meet greater than or equal to 90% of their needs  MONITOR:   PO intake, Supplement acceptance, Labs, Weight trends, Skin, I & O's  REASON FOR ASSESSMENT:   Malnutrition Screening Tool    ASSESSMENT:   77 year old male with PMHx of COPD, hx gastric ulcer, hx DVT and PE, HTN, hypercholesteremia, DM type 2, GERD, A-fib, asthma, CHF with two recent admissions (first for CHF and second for renal failure due to ureteral stone s/p nephrostomy tube placement) who now presents from rehab with acute hypoxic respiratory failure, acute CHF exacerbation, A-fib with RVR.   Met with patient and his wife at bedside. Patient somewhat confused. He reports his appetite is pretty good now but it was decreased PTA. His wife reports it has been decreased for a few weeks now. He has not been eating much but she is unsure exactly how much he has been eating. She reports that prior to recent admissions he had a very good appetite and would eat 3 good meals daily. Reports constipation.  UBW was 180 lbs, but wife reports difficult to trend weight at this time in setting of fluid overload. Noted in chart patient was 187.1 lbs 12/27/2016. Currently up to 200 lbs per chart.  Meal Completion: 30% of breakfast this morning  Medications reviewed and include: Lasix 40 mg Q12hrs, Novolog 0-9 units TID, pantoprazole, diltiazem.  Labs reviewed: CBG 62-176, BUN 54, Creatinine 1.72.  Patient does not meet criteria  for malnutrition at this time but is at risk for malnutrition.  Discussed on rounds. Patient was fully functioning at home prior to last admission.  NUTRITION - FOCUSED PHYSICAL EXAM:    Most Recent Value  Orbital Region  No depletion  Upper Arm Region  Mild depletion  Thoracic and Lumbar Region  No depletion  Buccal Region  No depletion  Temple Region  Moderate depletion  Clavicle Bone Region  Mild depletion  Clavicle and Acromion Bone Region  Mild depletion  Scapular Bone Region  No depletion  Dorsal Hand  No depletion  Patellar Region  No depletion  Anterior Thigh Region  No depletion  Posterior Calf Region  No depletion  Edema (RD Assessment)  Moderate  Hair  Reviewed  Eyes  Reviewed  Mouth  Reviewed  Skin  Reviewed  Nails  Reviewed     Diet Order:  DIET SOFT Room service appropriate? Yes; Fluid consistency: Thin  EDUCATION NEEDS:   No education needs have been identified at this time  Skin:  Skin Assessment: Skin Integrity Issues: Skin Integrity Issues:: Other (Comment) Other: non pressure wounds left leg; ecchymosis bilateral arms; skin tear mid sacrum  Last BM:  12/29/2017  Height:   Ht Readings from Last 1 Encounters:  12/30/17 6' (1.829 m)    Weight:   Wt Readings from Last 1 Encounters:  12/30/17 200 lb (90.7 kg)    Ideal Body Weight:  80.9 kg  BMI:  Body mass index is 27.12 kg/m.  Estimated Nutritional Needs:   Kcal:  2025-2190 (MSJ x 1.2-1.3)  Protein:  90-110 grams (1-1.2 grams/kg)  Fluid:  2 L/day (25 mL/kg IBW)  Willey Blade, MS, RD, LDN Office: [phone] Pager: [phone] After Hours/Weekend Pager: [phone]

## 2017-12-31 NOTE — Progress Notes (Signed)
Patient up from ED, tolerating bipap and oxygen saturation WNL. Pt denies pain on arrival. Cardizem infusing at 655ml/hr per order, HR 110's. Pt found with left nephrostomy tube with clear yellow urine draining, gauze dressing at tube insertion site noted to be dry. Pt does have a small wound noted to right lower ext 0.5cm X 0.5cm open with scant amount of bloody drainage, site assessed, cleansed and covered with pink foam dressing. Left lower ext 1.0 cm X 1.0 cm, closed and pink in color,site assessed, cleansed and covered with pink foam dressing. Pt also noted with skin tear/wound to sacral fold 2.0cm X 0.25cm X 0, no drainage noted, site assessed, cleansed and covered with pink foam dressing. WIfe is at bedside, patient admission profile completed with her assistance and patient's permission. Patient and wife oriented to room, equipment, and orders to be completed. Education provided to patient and family, wife verbalized understanding. Will continue to monitor patient and titrate Cardizem drip per order.

## 2017-12-31 NOTE — Progress Notes (Signed)
[facility] [place] [facility]   PATIENT NAME: Doil Kamara    MR#:  [id number]  DATE OF BIRTH:  1941-04-11  SUBJECTIVE:   Patient now on nasal cannula oxygen sats are more than 90% on room air.  Wife in the room.  Denies any complaints.  Mild shortness of breath. REVIEW OF SYSTEMS:   Review of Systems  Constitutional: Negative for chills, fever and weight loss.  HENT: Negative for ear discharge, ear pain and nosebleeds.   Eyes: Negative for blurred vision, pain and discharge.  Respiratory: Positive for shortness of breath. Negative for sputum production, wheezing and stridor.   Cardiovascular: Negative for chest pain, palpitations, orthopnea and PND.  Gastrointestinal: Negative for abdominal pain, diarrhea, nausea and vomiting.  Genitourinary: Negative for frequency and urgency.  Musculoskeletal: Negative for back pain and joint pain.  Neurological: Positive for weakness. Negative for sensory change, speech change and focal weakness.  Psychiatric/Behavioral: Negative for depression and hallucinations. The patient is not nervous/anxious.    Tolerating Diet:yes Tolerating PT: pending  DRUG ALLERGIES:   Allergies  Allergen Reactions  . Contrast Media [Iodinated Diagnostic Agents] Nausea And Vomiting  . Other Other (See Comments)    Cannot have "high doses of anesthesia because of his lungs"    VITALS:  Blood pressure 117/86, pulse 97, temperature (!) 97.4 F (36.3 C), resp. rate (!) 24, height 6' (1.829 m), weight 90.7 kg (200 lb), SpO2 100 %.  PHYSICAL EXAMINATION:   Physical Exam  GENERAL:  77 y.o.-year-old patient lying in the bed with no acute distress.  Appears chronically ill EYES: Pupils equal, round, reactive to light and accommodation. No scleral icterus. Extraocular muscles intact.  HEENT: Head atraumatic, normocephalic. Oropharynx and nasopharynx clear. poor Dentition NECK:  Supple, no jugular venous distention. No thyroid  enlargement, no tenderness.  LUNGS:decreased breath sounds bilaterally, no wheezing, rales, rhonchi. No use of accessory muscles of respiration.  CARDIOVASCULAR: S1, S2 normal. No murmurs, rubs, or gallops.  ABDOMEN: Soft, nontender, nondistended. Bowel sounds present. No organomegaly or mass. left-sided nephrostomy tube EXTREMITIES: No cyanosis, clubbing  +edema b/l.    NEUROLOGIC: Cranial nerves II through XII are intact. No focal Motor or sensory deficits b/l.   PSYCHIATRIC:  patient is alert and oriented x 2 SKIN: No obvious rash, lesion, or ulcer.   LABORATORY PANEL:  CBC Recent Labs  Lab 12/31/17 0438  WBC 7.7  HGB 14.9  HCT 46.0  PLT 104*    Chemistries  Recent Labs  Lab 12/26/17 0842  12/31/17 0024 12/31/17 0438  NA 133*   < >  --  138  K 4.9   < >  --  4.9  CL 97*   < >  --  103  CO2 24   < >  --  24  GLUCOSE 118*   < >  --  102*  BUN 51*   < >  --  54*  CREATININE 2.66*   < > 1.94* 1.72*  CALCIUM 9.6   < >  --  9.3  MG  --   --  2.3  --   AST 28  --   --   --   ALT 20  --   --   --   ALKPHOS 90  --   --   --   BILITOT 1.8*  --   --   --    < > = values in this interval not displayed.   Cardiac Enzymes  Recent Labs  Lab 12/31/17 0438  TROPONINI 0.20*   RADIOLOGY:  Dg Chest 1 View  Result Date: 12/30/2017 CLINICAL DATA:  Pitting edema. EXAM: CHEST 1 VIEW COMPARISON:  12/26/2017 FINDINGS: Cardiomediastinal silhouette is enlarged. Calcific atherosclerotic disease of the aorta. Mediastinal contours appear intact. There is no evidence of pneumothorax. Bilateral pleural effusions. Bibasilar atelectasis versus airspace consolidation. Probable interstitial pulmonary edema superimposed on emphysematous changes. Osseous structures are without acute abnormality. Soft tissues are grossly normal. IMPRESSION: Bilateral pleural effusions with interstitial pulmonary edema superimposed on chronic emphysematous changes. Bibasilar atelectasis versus airspace consolidation.  Enlarged cardiac silhouette. Electronically Signed   By: Ted Mcalpine M.D.   On: 12/30/2017 19:09   Dg Abdomen 1 View  Result Date: 12/30/2017 CLINICAL DATA:  Bilateral edema. EXAM: ABDOMEN - 1 VIEW COMPARISON:  December 27, 2017 FINDINGS: A left-sided catheter, consistent with a nephrostomy tube is identified. Left renal stones noted. A large stone in the left ureter is again noted. Air-filled loops of large and small bowel are again identified, similar in the interval. No free air, portal venous gas, or pneumatosis. There may be mild patchy opacity in the right mid lung. IMPRESSION: 1. Persistent air-filled dilated loops of large and small bowel. This could be seen with colonic obstruction or ileus. 2. Left nephrostomy tube.  Left ureteral stone. 3. Possible mild patchy opacity in the right mid lung, incompletely evaluated. Electronically Signed   By: Gerome Sam III M.D   On: 12/30/2017 19:09   Dg Chest Port 1 View  Result Date: 12/31/2017 CLINICAL DATA:  77 year old male with history of pulmonary edema. EXAM: PORTABLE CHEST 1 VIEW COMPARISON:  Chest x-ray 12/30/2017. FINDINGS: There is cephalization of the pulmonary vasculature and slight indistinctness of the interstitial markings suggestive of mild pulmonary edema. Mild emphysematous changes. Small bilateral pleural effusions. Mild cardiomegaly. Upper mediastinal contours are within normal limits. Aortic atherosclerosis. IMPRESSION: 1. The appearance the chest is most compatible with mild congestive heart failure, as above. No significant change. 2. Emphysema. Electronically Signed   By: Trudie Reed M.D.   On: 12/31/2017 07:10   ASSESSMENT AND PLAN:  Kevon Tench  is a 77 y.o. male with a known history of CHF- EF 50% in recent echo, COPD, Ch A fib, Htn, HLD, PE, recent admissions twice- first for CHF and then for renal failure due to Ureteral stone- s/p nephrostomy tube and d/c yesterday to rehab.  PT Was very SOB, so sent  to ER  today. At baseline pt is on 3 ltr oxygen. Required Bipap in ER due to CHF, have A fib with RVR and on cardizem drip  1.  Acute chronic hypoxic respiratory failure secondary to congestive heart failure acute on chronic diastolic -Currently is off BiPAP - IV Lasix 40 twice daily, monitor I's and O's, monitor creatinine -Echo showed EF of 50%  2.  A. fib with RVR -Rate in the 90s.  Patient is on Cardizem drip and digoxin -Change to oral meds when improved -Cardiology consultation -Defer anticoagulation to allergy  3.  Acute on chronic renal failure CKD stage III -Baseline creatinine 1.3 -Came in with creatinine 1.9--- 1.7  4.  Obstructive uropathy with left ureteral stone and status post left nephrostomy tube placement -We will follow with urology as outpatient  5.  Diabetes -Sliding scale insulin  6.  DVT prophylaxis subcu Lovenox   Case discussed with Care Management/Social Worker. Management plans discussed with the patient, family and they are in agreement.  CODE STATUS: Full  DVT Prophylaxis: Lovenox  TOTAL TIME TAKING CARE OF THIS PATIENT: 30 minutes.  >50% time spent on counselling and coordination of care with patient and wife  POSSIBLE D/C IN 2-3 DAYS, DEPENDING ON CLINICAL CONDITION.  Note: This dictation was prepared with Dragon dictation along with smaller phrase technology. Any transcriptional errors that result from this process are unintentional.  Enedina FinnerSona Shayann Garbutt M.D on 12/31/2017 at 1:33 PM  Between 7am to 6pm - Pager - [phone]  After 6pm go to [email or web address] - Social research officer, governmentpassword EPAS [facility]  [facility]  Office  [phone]  CC: Primary care physician; [facility] IncPatient ID: Benito MccreedyJames Allen Witter, male   DOB: 1941-09-12, 77 y.o.   MRN: [id number]

## 2017-12-31 NOTE — Evaluation (Signed)
Physical Therapy Evaluation Patient Details Name: Austin [name] MRN: [id number] DOB: Dec 13, 1940 Today's Date: 12/31/2017   History of Present Illness  presented to ER from [facility] secondary to SOB, respiratory distress; admitted with acute respiratory failure with hypoxia secondary to CHF, Afib with RVR (initially requiring cardizem drip).  Of note, patient with two recent hospitalizations secondary to CHF (1) and renal failure related to ureteral stone s/p nephrostomy tube placement (2)  Clinical Impression  Upon evaluation, patient alert and oriented to basic information; follows commands and demonstrates good effort with all functional tasks.  Bilat UE/LE generally weak and deconditioned due to acute illness/recent hospitalizations; no focal weakness, paresthesia noted.  Do note L facial droop at rest; patient/wife unable to fully acknowledge awareness/chronicity of this (RN informed/aware).  Bilat LE heels somewhat boggy; floated on heels end of session. Currently requiring mod/max assist for bed mobility and close sup for unsupported sitting balance. Maintains very forward flexed, kyphotic posture with constant cuing/assist for correction.  Unable to maintain beyond 15-20 seconds due to fatigue. OOB or gait efforts not attempted due to fatigue with session and transition to upright.  Will continue to assess/progress as able. Sats fluctuate from mid-80s to low-90s throughout session on supplemental O2 via [place]. Would benefit from skilled PT to address above deficits and promote optimal return to PLOF; recommend transition to STR upon discharge from acute hospitalization.     Follow Up Recommendations SNF    Equipment Recommendations  Rolling walker with 5" wheels    Recommendations for Other Services       Precautions / Restrictions Precautions Precautions: Fall Precaution Comments: L nephrostomy tube Restrictions Weight Bearing Restrictions: No      Mobility  Bed  Mobility Overal bed mobility: Needs Assistance Bed Mobility: Supine to Sit;Sit to Supine     Supine to sit: Mod assist;+2 for physical assistance Sit to supine: Max assist;+2 for physical assistance   General bed mobility comments: assist for truncal elevation and repositioning in bed  Transfers                 General transfer comment: unsafe/unable to attempt due to fatigue/SOB  Ambulation/Gait             General Gait Details: unsafe/unable to attempt due to fatigue/SOB  Stairs            Wheelchair Mobility    Modified Rankin (Stroke Patients Only)       Balance Overall balance assessment: Needs assistance Sitting-balance support: No upper extremity supported;Feet supported Sitting balance-Leahy Scale: Fair Sitting balance - Comments: maintains with close sup with accommodation to position; constant cuing for postural extension/upward gaze       Standing balance comment: unsafe/unable to attempt due to fatigue/SOB                             Pertinent Vitals/Pain Pain Assessment: No/denies pain    Home Living Family/patient expects to be discharged to:: Skilled nursing facility                 Additional Comments: Lives with wife in single-story home with 2 steps (no rails) to enter; discharged to STR after previous hospitalization, anticipates return for continuation/completion of rehab course    Prior Function Level of Independence: Independent         Comments: At baseline (prior to two previous hospitalizations), patient indep without assist device for ADLs, household and community mobilization.  Denies fall history. No home O2.     Hand Dominance        Extremity/Trunk Assessment   Upper Extremity Assessment Upper Extremity Assessment: Generalized weakness(grossly at least 4-/5 throughout; no focal weakness, paresthesia noted)    Lower Extremity Assessment Lower Extremity Assessment: Generalized  weakness(grossly 3+/5 throughout; no focal weakness or paresthesia noted)    Cervical / Trunk Assessment Cervical / Trunk Assessment: Kyphotic  Communication   Communication: No difficulties  Cognition Arousal/Alertness: Awake/alert Behavior During Therapy: WFL for tasks assessed/performed Overall Cognitive Status: Within Functional Limits for tasks assessed                                        General Comments      Exercises Other Exercises Other Exercises: Supine LE therex, 1x5, act assist ROM: ankle pumps, quad sets, heel slides, hip abduct/adduct and SLR. Other Exercises: Educated on importance of postural extension/scapular retraction/upward gaze for positioning and chest expansion.  Patient voices understanding, but unable ot maintain position greater than 15-20 seconds due to fatigue with effort.   Assessment/Plan    PT Assessment Patient needs continued PT services  PT Problem List Decreased strength;Decreased activity tolerance;Decreased balance;Decreased mobility;Decreased coordination;Decreased knowledge of use of DME;Decreased safety awareness;Decreased knowledge of precautions;Cardiopulmonary status limiting activity       PT Treatment Interventions DME instruction;Gait training;Functional mobility training;Neuromuscular re-education;Balance training;Therapeutic exercise;Therapeutic activities;Patient/family education;Stair training    PT Goals (Current goals can be found in the Care Plan section)  Acute Rehab PT Goals Patient Stated Goal: to try a little PT Goal Formulation: With patient/family Time For Goal Achievement: 01/14/18 Potential to Achieve Goals: Good    Frequency Min 2X/week   Barriers to discharge Inaccessible home environment;Decreased caregiver support      Co-evaluation               AM-PAC PT "6 Clicks" Daily Activity  Outcome Measure Difficulty turning over in bed (including adjusting bedclothes, sheets and  blankets)?: Unable Difficulty moving from lying on back to sitting on the side of the bed? : Unable Difficulty sitting down on and standing up from a chair with arms (e.g., wheelchair, bedside commode, etc,.)?: Unable Help needed moving to and from a bed to chair (including a wheelchair)?: Total Help needed walking in hospital room?: Total Help needed climbing 3-5 steps with a railing? : Total 6 Click Score: 6    End of Session Equipment Utilized During Treatment: Oxygen Activity Tolerance: Patient limited by fatigue Patient left: in bed;with call bell/phone within reach;with family/visitor present Nurse Communication: Mobility status PT Visit Diagnosis: Muscle weakness (generalized) (M62.81);Difficulty in walking, not elsewhere classified (R26.2)    Time: [id number] PT Time Calculation (min) (ACUTE ONLY): 25 min   Charges:   PT Evaluation $PT Eval Moderate Complexity: 1 Mod PT Treatments $Therapeutic Exercise: 8-22 mins   PT G Codes:        Wynonia Medero H. Manson PasseyBrown, PT, DPT, NCS 12/31/17, 2:42 PM [phone]

## 2017-12-31 NOTE — Consult Note (Signed)
Austin [name] is a 77 y.o. male  [id number]  Primary Cardiologist: Dr. Adrian Blackwater Reason for Consultation: Afib RVR CHF  HPI: 77yo male with history of asthma, atrial fibrillation, CHF, COPD, DVT, and recent surgery for bowel obstruction from left inguinal hernia who presented to ER with shortness of breath requiring 4L nasal cannula. He denied chest pain but has worsening lower extremity edema. He recently stopped taking lasix after his surgery. He has a history of severe pulmonary hypertension on echo 12/09/17 with LVEF 50%.    Review of Systems: Shortness of breath is improving, now on nasal canula.   Past Medical History:  Diagnosis Date  . Acute lower UTI 07/16/2017  . Asthma   . Atrial fibrillation (HCC)   . CHF (congestive heart failure) (HCC)   . Chronic atrial fibrillation (HCC)   . 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 COPD (chronic obstructive pulmonary disease) (HCC)   . Diabetes mellitus without complication (HCC)   . DVT (deep venous thrombosis) (HCC)   . Gastric ulcer   . GERD (gastroesophageal reflux disease)   . Hypercholesteremia   . Hypertension   . Moderate to severe pulmonary hypertension (HCC)--per 2-D echo 07/14/2017 07/15/2017  . PE (pulmonary embolism)   . Sepsis due to undetermined organism with acute respiratory failure (HCC) 07/13/2017    Medications Prior to Admission  Medication Sig Dispense Refill  . albuterol (PROAIR HFA) 108 (90 Base) MCG/ACT inhaler Inhale 1-2 puffs into the lungs every 6 (six) hours as needed for wheezing or shortness of breath.    Marland Kitchen aspirin EC 81 MG tablet Take 81 mg by mouth daily.    . budesonide-formoterol (SYMBICORT) 160-4.5 MCG/ACT inhaler Inhale 1 puff into the lungs 2 (two) times daily. 1 Inhaler 12  . digoxin (LANOXIN) 0.125 MG tablet Take 1 tablet (0.125 mg total) by mouth daily. 30 tablet 0  . glipiZIDE (GLUCOTROL) 5 MG tablet Take 1 tablet (5 mg total) by mouth 2 (two) times daily.  DO NOT TAKE THIS DIABETES MEDICINE IF YOUR BLOOD SUGAR IS LESS THAN 130.    Marland Kitchen guaiFENesin-dextromethorphan (ROBITUSSIN DM) 100-10 MG/5ML syrup Take 10 mLs by mouth every 6 (six) hours as needed for cough. 118 mL 0  . insulin aspart (NOVOLOG) 100 UNIT/ML injection Inject 0-5 Units into the skin at bedtime. CBG < 70: implement hypoglycemia protocol CBG 70 - 120: 0 units CBG 121 - 150: 0 units CBG 151 - 200: 0 units CBG 201 - 250: 2 units CBG 251 - 300: 3 units CBG 301 - 350: 4 units CBG 351 - 400: 5 units CBG > 400: call MD and obtain STAT lab verification 10 mL 11  . insulin aspart (NOVOLOG) 100 UNIT/ML injection Inject 0-15 Units into the skin 3 (three) times daily with meals. CBG < 70: implement hypoglycemia protocol CBG 70 - 120: 0 units CBG 121 - 150: 2 units CBG 151 - 200: 3 units CBG 201 - 250: 5 units CBG 251 - 300: 8 units CBG 301 - 350: 11 units CBG 351 - 400: 15 units CBG > 400: call MD and obtain STAT lab verification 10 mL 11  . insulin glargine (LANTUS) 100 UNIT/ML injection Inject 0.1 mLs (10 Units total) into the skin daily. 10 mL 11  . ipratropium-albuterol (DUONEB) 0.5-2.5 (3) MG/3ML SOLN Take 3 mLs by nebulization every 6 (six) hours. 360 mL   . lactulose (CHRONULAC) 10 GM/15ML solution Take 10 g by mouth 2 (two) times daily.    Marland Kitchen  lovastatin (MEVACOR) 20 MG tablet Take 20 mg by mouth at bedtime. Pt should take with food.     . mupirocin cream (BACTROBAN) 2 % Apply topically 2 (two) times daily. 15 g 0  . nicotine (NICODERM CQ - DOSED IN MG/24 HOURS) 14 mg/24hr patch Place 1 patch (14 mg total) onto the skin daily. 28 patch 0  . nitroGLYCERIN (NITROSTAT) 0.4 MG SL tablet Place 0.4 mg under the tongue every 5 (five) minutes x 3 doses as needed for chest pain. If no relief call 911 or go to emergency room.    Marland Kitchen omeprazole (PRILOSEC) 40 MG capsule Take 40 mg by mouth daily.    Marland Kitchen oxyCODONE (OXY IR/ROXICODONE) 5 MG immediate release tablet Take 1 tablet (5 mg total) by mouth  every 4 (four) hours as needed for moderate pain. 30 tablet 0  . polyethylene glycol (MIRALAX) packet Take 17 g by mouth daily as needed for moderate constipation. 30 each 0     . aspirin EC  81 mg Oral Daily  . budesonide (PULMICORT) nebulizer solution  0.25 mg Nebulization Q12H  . chlorhexidine  15 mL Mouth Rinse BID  . digoxin  0.125 mg Oral Daily  . diltiazem  30 mg Oral Q6H  . furosemide  20 mg Intravenous Q12H  . heparin  5,000 Units Subcutaneous Q8H  . insulin aspart  0-9 Units Subcutaneous TID WC  . insulin glargine  10 Units Subcutaneous Daily  . ipratropium-albuterol  3 mL Nebulization Q6H  . mouth rinse  15 mL Mouth Rinse q12n4p  . nicotine  14 mg Transdermal Daily  . pantoprazole  40 mg Oral Daily  . pravastatin  20 mg Oral q1800    Infusions: . diltiazem (CARDIZEM) infusion Stopped (12/31/17 0429)    Allergies  Allergen Reactions  . Contrast Media [Iodinated Diagnostic Agents] Nausea And Vomiting  . Other Other (See Comments)    Cannot have "high doses of anesthesia because of his lungs"    Social History   Socioeconomic History  . Marital status: Married    Spouse name: Not on file  . Number of children: Not on file  . Years of education: Not on file  . Highest education level: Not on file  Social Needs  . Financial resource strain: Not on file  . Food insecurity - worry: Not on file  . Food insecurity - inability: Not on file  . Transportation needs - medical: Not on file  . Transportation needs - non-medical: Not on file  Occupational History  . Not on file  Tobacco Use  . Smoking status: Current Every Day Smoker    Packs/day: 1.00    Years: 45.00    Pack years: 45.00    Types: Cigarettes  . Smokeless tobacco: Never Used  Substance and Sexual Activity  . Alcohol use: No  . Drug use: No  . Sexual activity: Not on file  Other Topics Concern  . Not on file  Social History Narrative  . Not on file    Family History  Problem Relation Age of  Onset  . CAD Brother   . Congestive Heart Failure Brother     PHYSICAL EXAM: Vitals:   12/31/17 0525 12/31/17 0526  BP:    Pulse: 94 85  Resp: (!) 24 (!) 28  Temp:  (!) 96.9 F (36.1 C)  SpO2: 98% 100%     Intake/Output Summary (Last 24 hours) at 12/31/2017 0844 Last data filed at 12/31/2017 0429 Gross per 24  hour  Intake 57.63 ml  Output 325 ml  Net -267.37 ml    General:  Well appearing. No respiratory difficulty HEENT: normal Neck: supple. no JVD. Carotids 2+ bilat; no bruits. No lymphadenopathy or thryomegaly appreciated. Cor: PMI nondisplaced. Regular rate & rhythm. No rubs, gallops or murmurs. Lungs: clear Abdomen: soft, nontender, nondistended. No hepatosplenomegaly. No bruits or masses. Good bowel sounds. Extremities: no cyanosis, clubbing, rash, edema Neuro: alert & oriented x 3, cranial nerves grossly intact. moves all 4 extremities w/o difficulty. Affect pleasant.  ECG: Afib rate controlled 90bpm  Results for orders placed or performed during the hospital encounter of 12/30/17 (from the past 24 hour(s))  CBC with Differential     Status: Abnormal   Collection Time: 12/30/17  6:31 PM  Result Value Ref Range   WBC 10.0 3.8 - 10.6 K/uL   RBC 5.71 4.40 - 5.90 MIL/uL   Hemoglobin 16.8 13.0 - 18.0 g/dL   HCT 16.152.7 (H) 09.640.0 - 04.552.0 %   MCV 92.3 80.0 - 100.0 fL   MCH 29.4 26.0 - 34.0 pg   MCHC 31.9 (L) 32.0 - 36.0 g/dL   RDW 40.917.0 (H) 81.111.5 - 91.414.5 %   Platelets 113 (L) 150 - 440 K/uL   Neutrophils Relative % 88 %   Neutro Abs 8.8 (H) 1.4 - 6.5 K/uL   Lymphocytes Relative 5 %   Lymphs Abs 0.5 (L) 1.0 - 3.6 K/uL   Monocytes Relative 7 %   Monocytes Absolute 0.7 0.2 - 1.0 K/uL   Eosinophils Relative 0 %   Eosinophils Absolute 0.0 0 - 0.7 K/uL   Basophils Relative 0 %   Basophils Absolute 0.0 0 - 0.1 K/uL  Basic metabolic panel     Status: Abnormal   Collection Time: 12/30/17  6:31 PM  Result Value Ref Range   Sodium 135 135 - 145 mmol/L   Potassium 4.9 3.5 -  5.1 mmol/L   Chloride 99 (L) 101 - 111 mmol/L   CO2 23 22 - 32 mmol/L   Glucose, Bld 141 (H) 65 - 99 mg/dL   BUN 54 (H) 6 - 20 mg/dL   Creatinine, Ser 7.821.87 (H) 0.61 - 1.24 mg/dL   Calcium 9.5 8.9 - 95.610.3 mg/dL   GFR calc non Af Amer 34 (L) >60 mL/min   GFR calc Af Amer 39 (L) >60 mL/min   Anion gap 13 5 - 15  Brain natriuretic peptide     Status: Abnormal   Collection Time: 12/30/17  6:31 PM  Result Value Ref Range   B Natriuretic Peptide 1,225.0 (H) 0.0 - 100.0 pg/mL  Troponin I     Status: Abnormal   Collection Time: 12/30/17  6:31 PM  Result Value Ref Range   Troponin I 0.20 (HH) <0.03 ng/mL  Digoxin level     Status: None   Collection Time: 12/30/17  6:56 PM  Result Value Ref Range   Digoxin Level 1.2 0.8 - 2.0 ng/mL  Urinalysis, Complete w Microscopic     Status: Abnormal   Collection Time: 12/30/17  8:08 PM  Result Value Ref Range   Color, Urine RED (A) YELLOW   APPearance CLOUDY (A) CLEAR   Specific Gravity, Urine 1.022 1.005 - 1.030   pH  5.0 - 8.0    TEST NOT REPORTED DUE TO COLOR INTERFERENCE OF URINE PIGMENT   Glucose, UA (A) NEGATIVE mg/dL    TEST NOT REPORTED DUE TO COLOR INTERFERENCE OF URINE PIGMENT   Hgb urine dipstick (  A) NEGATIVE    TEST NOT REPORTED DUE TO COLOR INTERFERENCE OF URINE PIGMENT   Bilirubin Urine (A) NEGATIVE    TEST NOT REPORTED DUE TO COLOR INTERFERENCE OF URINE PIGMENT   Ketones, ur (A) NEGATIVE mg/dL    TEST NOT REPORTED DUE TO COLOR INTERFERENCE OF URINE PIGMENT   Protein, ur (A) NEGATIVE mg/dL    TEST NOT REPORTED DUE TO COLOR INTERFERENCE OF URINE PIGMENT   Nitrite (A) NEGATIVE    TEST NOT REPORTED DUE TO COLOR INTERFERENCE OF URINE PIGMENT   Leukocytes, UA (A) NEGATIVE    TEST NOT REPORTED DUE TO COLOR INTERFERENCE OF URINE PIGMENT   RBC / HPF TOO NUMEROUS TO COUNT 0 - 5 RBC/hpf   WBC, UA TOO NUMEROUS TO COUNT 0 - 5 WBC/hpf   Bacteria, UA MANY (A) NONE SEEN   Squamous Epithelial / LPF NONE SEEN NONE SEEN   WBC Clumps PRESENT    Troponin I     Status: Abnormal   Collection Time: 12/30/17  8:10 PM  Result Value Ref Range   Troponin I 0.18 (HH) <0.03 ng/mL  Glucose, capillary     Status: Abnormal   Collection Time: 12/30/17  9:12 PM  Result Value Ref Range   Glucose-Capillary 116 (H) 65 - 99 mg/dL   Comment 1 Notify RN    Comment 2 Document in Chart   MRSA PCR Screening     Status: None   Collection Time: 12/30/17  9:23 PM  Result Value Ref Range   MRSA by PCR NEGATIVE NEGATIVE  Troponin I     Status: Abnormal   Collection Time: 12/31/17 12:24 AM  Result Value Ref Range   Troponin I 0.21 (HH) <0.03 ng/mL  CBC     Status: Abnormal   Collection Time: 12/31/17 12:24 AM  Result Value Ref Range   WBC 8.8 3.8 - 10.6 K/uL   RBC 5.07 4.40 - 5.90 MIL/uL   Hemoglobin 14.9 13.0 - 18.0 g/dL   HCT 16.1 09.6 - 04.5 %   MCV 92.1 80.0 - 100.0 fL   MCH 29.3 26.0 - 34.0 pg   MCHC 31.9 (L) 32.0 - 36.0 g/dL   RDW 40.9 (H) 81.1 - 91.4 %   Platelets 110 (L) 150 - 440 K/uL  Creatinine, serum     Status: Abnormal   Collection Time: 12/31/17 12:24 AM  Result Value Ref Range   Creatinine, Ser 1.94 (H) 0.61 - 1.24 mg/dL   GFR calc non Af Amer 32 (L) >60 mL/min   GFR calc Af Amer 37 (L) >60 mL/min  Magnesium     Status: None   Collection Time: 12/31/17 12:24 AM  Result Value Ref Range   Magnesium 2.3 1.7 - 2.4 mg/dL  Phosphorus     Status: None   Collection Time: 12/31/17 12:24 AM  Result Value Ref Range   Phosphorus 3.8 2.5 - 4.6 mg/dL  Troponin I     Status: Abnormal   Collection Time: 12/31/17  4:38 AM  Result Value Ref Range   Troponin I 0.20 (HH) <0.03 ng/mL  Basic metabolic panel     Status: Abnormal   Collection Time: 12/31/17  4:38 AM  Result Value Ref Range   Sodium 138 135 - 145 mmol/L   Potassium 4.9 3.5 - 5.1 mmol/L   Chloride 103 101 - 111 mmol/L   CO2 24 22 - 32 mmol/L   Glucose, Bld 102 (H) 65 - 99 mg/dL   BUN 54 (H) 6 -  20 mg/dL   Creatinine, Ser 4.09 (H) 0.61 - 1.24 mg/dL   Calcium 9.3 8.9 -  81.1 mg/dL   GFR calc non Af Amer 37 (L) >60 mL/min   GFR calc Af Amer 43 (L) >60 mL/min   Anion gap 11 5 - 15  CBC     Status: Abnormal   Collection Time: 12/31/17  4:38 AM  Result Value Ref Range   WBC 7.7 3.8 - 10.6 K/uL   RBC 5.01 4.40 - 5.90 MIL/uL   Hemoglobin 14.9 13.0 - 18.0 g/dL   HCT 91.4 78.2 - 95.6 %   MCV 91.9 80.0 - 100.0 fL   MCH 29.7 26.0 - 34.0 pg   MCHC 32.4 32.0 - 36.0 g/dL   RDW 21.3 (H) 08.6 - 57.8 %   Platelets 104 (L) 150 - 440 K/uL  Glucose, capillary     Status: Abnormal   Collection Time: 12/31/17  7:57 AM  Result Value Ref Range   Glucose-Capillary 62 (L) 65 - 99 mg/dL  Glucose, capillary     Status: Abnormal   Collection Time: 12/31/17  8:23 AM  Result Value Ref Range   Glucose-Capillary 176 (H) 65 - 99 mg/dL   Dg Chest 1 View  Result Date: 12/30/2017 CLINICAL DATA:  Pitting edema. EXAM: CHEST 1 VIEW COMPARISON:  12/26/2017 FINDINGS: Cardiomediastinal silhouette is enlarged. Calcific atherosclerotic disease of the aorta. Mediastinal contours appear intact. There is no evidence of pneumothorax. Bilateral pleural effusions. Bibasilar atelectasis versus airspace consolidation. Probable interstitial pulmonary edema superimposed on emphysematous changes. Osseous structures are without acute abnormality. Soft tissues are grossly normal. IMPRESSION: Bilateral pleural effusions with interstitial pulmonary edema superimposed on chronic emphysematous changes. Bibasilar atelectasis versus airspace consolidation. Enlarged cardiac silhouette. Electronically Signed   By: Ted Mcalpine M.D.   On: 12/30/2017 19:09   Dg Abdomen 1 View  Result Date: 12/30/2017 CLINICAL DATA:  Bilateral edema. EXAM: ABDOMEN - 1 VIEW COMPARISON:  December 27, 2017 FINDINGS: A left-sided catheter, consistent with a nephrostomy tube is identified. Left renal stones noted. A large stone in the left ureter is again noted. Air-filled loops of large and small bowel are again identified, similar  in the interval. No free air, portal venous gas, or pneumatosis. There may be mild patchy opacity in the right mid lung. IMPRESSION: 1. Persistent air-filled dilated loops of large and small bowel. This could be seen with colonic obstruction or ileus. 2. Left nephrostomy tube.  Left ureteral stone. 3. Possible mild patchy opacity in the right mid lung, incompletely evaluated. Electronically Signed   By: Gerome Sam III M.D   On: 12/30/2017 19:09   Dg Chest Port 1 View  Result Date: 12/31/2017 CLINICAL DATA:  77 year old male with history of pulmonary edema. EXAM: PORTABLE CHEST 1 VIEW COMPARISON:  Chest x-ray 12/30/2017. FINDINGS: There is cephalization of the pulmonary vasculature and slight indistinctness of the interstitial markings suggestive of mild pulmonary edema. Mild emphysematous changes. Small bilateral pleural effusions. Mild cardiomegaly. Upper mediastinal contours are within normal limits. Aortic atherosclerosis. IMPRESSION: 1. The appearance the chest is most compatible with mild congestive heart failure, as above. No significant change. 2. Emphysema. Electronically Signed   By: Trudie Reed M.D.   On: 12/31/2017 07:10     ASSESSMENT AND PLAN: Acute on chronic CHF with history of borderline normal LVEF, severe pulmonary HTN, and chronic atrial fibrillation. Advise repeat echo and Increase IV lasix to 40mg  BID. Continue oral cardizem and digoxin for rate control. Plan of care  discussed with Dr. Welton Flakes.   Caroleen Hamman, NP-C Cell: [phone]

## 2018-01-01 DIAGNOSIS — I5033 Acute on chronic diastolic (congestive) heart failure: Secondary | ICD-10-CM

## 2018-01-01 DIAGNOSIS — J9601 Acute respiratory failure with hypoxia: Secondary | ICD-10-CM

## 2018-01-01 LAB — GLUCOSE, CAPILLARY
Glucose-Capillary: 133 mg/dL — ABNORMAL HIGH (ref 65–99)
Glucose-Capillary: 187 mg/dL — ABNORMAL HIGH (ref 65–99)
Glucose-Capillary: 225 mg/dL — ABNORMAL HIGH (ref 65–99)
Glucose-Capillary: 217 mg/dL — ABNORMAL HIGH (ref 65–99)

## 2018-01-01 LAB — BASIC METABOLIC PANEL
Anion gap: 10 (ref 5–15)
BUN: 53 mg/dL — AB (ref 6–20)
CHLORIDE: 101 mmol/L (ref 101–111)
CO2: 26 mmol/L (ref 22–32)
Calcium: 9.7 mg/dL (ref 8.9–10.3)
Creatinine, Ser: 1.66 mg/dL — ABNORMAL HIGH (ref 0.61–1.24)
GFR calc Af Amer: 45 mL/min — ABNORMAL LOW (ref >60)
GFR calc non Af Amer: 39 mL/min — ABNORMAL LOW (ref >60)
GLUCOSE: 143 mg/dL — AB (ref 65–99)
POTASSIUM: 5 mmol/L (ref 3.5–5.1)
Sodium: 137 mmol/L (ref 135–145)

## 2018-01-01 LAB — PHOSPHORUS: Phosphorus: 2.8 mg/dL (ref 2.5–4.6)

## 2018-01-01 LAB — MAGNESIUM: Magnesium: 2.3 mg/dL (ref 1.7–2.4)

## 2018-01-01 MED ORDER — APIXABAN 5 MG PO TABS
5.0000 mg | ORAL_TABLET | Freq: Two times a day (BID) | ORAL | Status: DC
  Administered 2018-01-01: 5 mg via ORAL
  Filled 2018-01-01: qty 1

## 2018-01-01 MED ORDER — METOPROLOL TARTRATE 5 MG/5ML IV SOLN: 5.0000 mg | Freq: Four times a day (QID) | INTRAVENOUS | Status: DC | PRN

## 2018-01-01 MED ORDER — MORPHINE SULFATE (PF) 2 MG/ML IV SOLN
2.0000 mg | Freq: Once | INTRAVENOUS | Status: AC
  Administered 2018-01-01: 2 mg via INTRAVENOUS
  Filled 2018-01-01: qty 1

## 2018-01-01 MED ORDER — FUROSEMIDE 10 MG/ML IJ SOLN
60.0000 mg | Freq: Two times a day (BID) | INTRAMUSCULAR | Status: DC
  Administered 2018-01-01 – 2018-01-03 (×5): 60 mg via INTRAVENOUS
  Filled 2018-01-01 (×6): qty 6

## 2018-01-01 NOTE — Progress Notes (Signed)
1600 Urine output dropped. Foley tubing milked. Urine no longer clean-more bloody.1700 No urine output. Foley irrigated x 2 with great difficulty. No return of urine. Foley d/ced. Blood clot noted blocking foley drainage hole. # 16 foley reinserted after peri care done. Urine retrieved.

## 2018-01-01 NOTE — Progress Notes (Signed)
Late note 1800  Patient coughed up bright red bloody sputum. Dr.Kasa notified. Eliquis d/ced.

## 2018-01-01 NOTE — Clinical Social Work Note (Signed)
Clinical Social Work Assessment  Patient Details  Name: Austin [name] MRN: [id number] Date of Birth: June 26, 1941  Date of referral:  01/01/18               Reason for consult:  Discharge Planning                Permission sought to share information with:    Permission granted to share information::     Name::        Agency::     Relationship::     Contact Information:     Housing/Transportation Living arrangements for the past 2 months:  Skilled Nursing Facility Source of Information:  Spouse Patient Interpreter Needed:  None Criminal Activity/Legal Involvement Pertinent to Current Situation/Hospitalization:  No - Comment as needed Significant Relationships:  Spouse Lives with:  Facility Resident Do you feel safe going back to the place where you live?  Yes Need for family participation in patient care:  Yes (Comment)  Care giving concerns:  [facility] [employer]   Social Worker assessment / plan:  Patient recently discharged from hospital and [facility] Patient has returned to hospital and CSW contacted patient's wife, Merry ProudChristine Mazo, [phone] via phone. CSW discussed with her the conversation she had with the RN CM earlier today and she stated that she does wish to take him home if possible. CSW discussed what PT recommended and why and patient's wife stated that she was hopeful that PT would continue to work with him and that he would be able to ambulate some. She stated that if [facility] needed to [facility], that she would be in agreement with this.  Employment status:  Retired Health and safety inspectornsurance information:    PT Recommendations:  Skilled Nursing Facility Information / Referral to community resources:     Patient/Family's Response to care:  Patient's wife expressed appreciation for CSW assistance.  Patient/Family's Understanding of and Emotional Response to Diagnosis, Current Treatment, and Prognosis:  Patient's  wife is involved with patient's care.  Emotional Assessment Appearance:    Attitude/Demeanor/Rapport:    Affect (typically observed):  Calm Orientation:  Fluctuating Orientation (Suspected and/or reported Sundowners) Alcohol / Substance use:  Not Applicable Psych involvement (Current and /or in the community):  No (Comment)  Discharge Needs  Concerns to be addressed:  Care Coordination Readmission within the last 30 days:  Yes Current discharge risk:  None Barriers to Discharge:  No Barriers Identified   York SpanielMonica Gaylon Melchor, LCSW 01/01/2018, 4:32 PM

## 2018-01-01 NOTE — [place] FL2 (Signed)
[place] MEDICAID FL2 LEVEL OF CARE SCREENING TOOL     IDENTIFICATION  Patient Name: Austin [name] Birthdate: February 07, 1941 Sex: male Admission Date (Current Location): 12/30/2017  [place] and [place] Number:  Chiropodist and Address:  [facility], [address] [place], [place] [address]      Provider Number: [id number]  Attending Physician Name and Address:  Enedina Finner, MD  Relative Name and Phone Number:       Current Level of Care: Hospital Recommended Level of Care: Skilled Nursing Facility Prior Approval Number:    Date Approved/Denied:   PASRR Number:    Discharge Plan: SNF    Current Diagnoses: Patient Active Problem List   Diagnosis Date Noted  . Atrial fibrillation with RVR (HCC) 12/30/2017  . Ileus (HCC)   . Scrotal hernia   . Obstructive uropathy 12/26/2017  . Hydronephrosis   . Atrial fibrillation (HCC) 11/08/2017  . Atrial fibrillation and flutter (HCC) 11/06/2017  . 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 Acute on chronic diastolic CHF (congestive heart failure) (HCC)   . Chronic atrial fibrillation (HCC)   . Acute lower UTI 07/16/2017  . Sepsis (HCC)   . Acute respiratory failure with hypoxia (HCC) 07/15/2017  . Moderate to severe pulmonary hypertension (HCC)--per 2-D echo 07/14/2017 07/15/2017  . Mass of lung 07/15/2017  . Ascending aortic aneurysm (HCC) 07/14/2017  . Elevated bilirubin 07/14/2017  . Kidney stone 07/14/2017  . Lactic acidosis 07/14/2017  . Sepsis due to undetermined organism with acute respiratory failure (HCC) 07/13/2017  . COPD exacerbation (HCC) 12/26/2016  . Hypoxia 11/03/2015  . COPD (chronic obstructive pulmonary disease) (HCC) 11/03/2015  . DVT of popliteal vein (HCC) 11/03/2015  . Chronic systolic CHF (congestive heart failure) (HCC) 11/03/2015  . Essential hypertension 11/03/2015  . Diabetes mellitus (HCC) 11/03/2015  . Continuous tobacco abuse  11/03/2015  . Chronic pulmonary embolism (HCC) 11/03/2015  . 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 Hemoptysis 10/31/2015  . Elevated troponin 09/16/2015  . SOB (shortness of breath) 08/24/2015  . Pulmonary embolism (HCC) 08/24/2015  . 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 Type 2 diabetes mellitus (HCC) 08/16/2015  . HLD (hyperlipidemia) 08/16/2015  . GERD (gastroesophageal reflux disease) 08/16/2015  . Chest pain 08/16/2015  . Constipation 08/16/2015  . HTN (hypertension) 07/21/2015  . COPD (chronic obstructive pulmonary disease) with chronic bronchitis (HCC) 07/21/2015  . CHF (NYHA class IV, ACC/AHA stage D) (HCC) 07/04/2015  . CHF (congestive heart failure) (HCC) 07/04/2015    Orientation RESPIRATION BLADDER Height & Weight     Self, Place  Normal, O2(4 liters) Indwelling catheter Weight: 200 lb (90.7 kg) Height:  6' (182.9 cm)  BEHAVIORAL SYMPTOMS/MOOD NEUROLOGICAL BOWEL NUTRITION STATUS  (none) (none) Continent Diet(soft)  AMBULATORY STATUS COMMUNICATION OF NEEDS Skin   Extensive Assist Verbally PU Stage and Appropriate Care                       Personal Care Assistance Level of Assistance  Total care Bathing Assistance: Maximum assistance   Dressing Assistance: Maximum assistance Total Care Assistance: Maximum assistance   Functional Limitations Info             SPECIAL CARE FACTORS FREQUENCY  PT (By licensed PT)                    Contractures Contractures Info: Not present  Additional Factors Info                  Current Medications (01/01/2018):  This is the current hospital active medication list Current Facility-Administered Medications  Medication Dose Route Frequency Provider Last Rate Last Dose  . apixaban (ELIQUIS) tablet 5 mg  5 mg Oral BID Erin FullingKasa, Kurian, MD   5 mg at 01/01/18 1212  . aspirin EC tablet 81 mg  81 mg Oral Daily Altamese DillingVachhani, Vaibhavkumar, MD   81  mg at 01/01/18 1212  . budesonide (PULMICORT) nebulizer solution 0.5 mg  0.5 mg Nebulization Q12H Erin FullingKasa, Kurian, MD   0.5 mg at 01/01/18 0739  . chlorhexidine (PERIDEX) 0.12 % solution 15 mL  15 mL Mouth Rinse BID Altamese DillingVachhani, Vaibhavkumar, MD   15 mL at 01/01/18 1213  . digoxin (LANOXIN) tablet 0.125 mg  0.125 mg Oral Daily Altamese DillingVachhani, Vaibhavkumar, MD   0.125 mg at 01/01/18 1211  . diltiazem (CARDIZEM) tablet 60 mg  60 mg Oral Q6H Tukov, Magadalene S, NP   60 mg at 01/01/18 1212  . docusate sodium (COLACE) capsule 100 mg  100 mg Oral BID PRN Altamese DillingVachhani, Vaibhavkumar, MD      . feeding supplement (ENSURE ENLIVE) (ENSURE ENLIVE) liquid 237 mL  237 mL Oral BID BM Erin FullingKasa, Kurian, MD   237 mL at 01/01/18 1449  . furosemide (LASIX) injection 60 mg  60 mg Intravenous Q12H Erin FullingKasa, Kurian, MD   60 mg at 01/01/18 0831  . insulin aspart (novoLOG) injection 0-9 Units  0-9 Units Subcutaneous TID WC Altamese DillingVachhani, Vaibhavkumar, MD   2 Units at 01/01/18 1213  . ipratropium-albuterol (DUONEB) 0.5-2.5 (3) MG/3ML nebulizer solution 3 mL  3 mL Nebulization Q6H Altamese DillingVachhani, Vaibhavkumar, MD   3 mL at 01/01/18 1423  . MEDLINE mouth rinse  15 mL Mouth Rinse q12n4p Altamese DillingVachhani, Vaibhavkumar, MD      . nicotine (NICODERM CQ - dosed in mg/24 hours) patch 14 mg  14 mg Transdermal Daily Altamese DillingVachhani, Vaibhavkumar, MD   14 mg at 01/01/18 1211  . nitroGLYCERIN (NITROSTAT) SL tablet 0.4 mg  0.4 mg Sublingual Q5 Min x 3 PRN Altamese DillingVachhani, Vaibhavkumar, MD      . oxyCODONE (Oxy IR/ROXICODONE) immediate release tablet 5 mg  5 mg Oral Q4H PRN Altamese DillingVachhani, Vaibhavkumar, MD      . pantoprazole (PROTONIX) EC tablet 40 mg  40 mg Oral Daily Altamese DillingVachhani, Vaibhavkumar, MD   40 mg at 01/01/18 1212  . polyethylene glycol (MIRALAX / GLYCOLAX) packet 17 g  17 g Oral Daily PRN Altamese DillingVachhani, Vaibhavkumar, MD      . pravastatin (PRAVACHOL) tablet 20 mg  20 mg Oral q1800 Altamese DillingVachhani, Vaibhavkumar, MD   20 mg at 12/31/17 1809     Discharge Medications: Please see discharge summary for a  list of discharge medications.  Relevant Imaging Results:  Relevant Lab Results:   Additional Information    York SpanielMonica Mary-Anne Polizzi, LCSW

## 2018-01-01 NOTE — Progress Notes (Addendum)
1000 Patient has voided very little from 60 mg of Lasix given at 0800. Bladder appears distended. Bladder  scanned- 400 ml of urine present in bladder. Dr. Belia HemanKasa informed . Order for foley catheter given verbally.After foley inserted. 500ml of bloody urine noted. Dr.Kasa in to check on patient  Order to iriigate foley given. Foley irrigated with sterile normal saline 60 mls x 2 . Numerous clotts retrieved and urine less bloody in color. 1130 Urine less bloody in color and flowing smoothly.

## 2018-01-01 NOTE — Progress Notes (Signed)
[facility] [place] [facility]   PATIENT NAME: Austin [name]    MR#:  [id number]  DATE OF BIRTH:  Apr 20, 1941  SUBJECTIVE:   Patient now on nasal cannula oxygen sats are more than 90% on room air.    Denies any complaints.  Mild shortness of breath. HR much improved---off cardizem gtt REVIEW OF SYSTEMS:   Review of Systems  Constitutional: Negative for chills, fever and weight loss.  HENT: Negative for ear discharge, ear pain and nosebleeds.   Eyes: Negative for blurred vision, pain and discharge.  Respiratory: Positive for shortness of breath. Negative for sputum production, wheezing and stridor.   Cardiovascular: Negative for chest pain, palpitations, orthopnea and PND.  Gastrointestinal: Negative for abdominal pain, diarrhea, nausea and vomiting.  Genitourinary: Negative for frequency and urgency.  Musculoskeletal: Negative for back pain and joint pain.  Neurological: Positive for weakness. Negative for sensory change, speech change and focal weakness.  Psychiatric/Behavioral: Negative for depression and hallucinations. The patient is not nervous/anxious.    Tolerating Diet:yes Tolerating PT: SNF  DRUG ALLERGIES:   Allergies  Allergen Reactions  . Contrast Media [Iodinated Diagnostic Agents] Nausea And Vomiting  . Other Other (See Comments)    Cannot have "high doses of anesthesia because of his lungs"    VITALS:  Blood pressure 119/84, pulse 93, temperature (!) 96.6 F (35.9 C), temperature source Axillary, resp. rate 19, height 6' (1.829 m), weight 90.7 kg (200 lb), SpO2 94 %.  PHYSICAL EXAMINATION:   Physical Exam  GENERAL:  77 y.o.-year-old patient lying in the bed with no acute distress.  Appears chronically ill EYES: Pupils equal, round, reactive to light and accommodation. No scleral icterus. Extraocular muscles intact.  HEENT: Head atraumatic, normocephalic. Oropharynx and nasopharynx clear. poor Dentition NECK:  Supple, no  jugular venous distention. No thyroid enlargement, no tenderness.  LUNGS:decreased breath sounds bilaterally, no wheezing, rales, rhonchi. No use of accessory muscles of respiration.  CARDIOVASCULAR: S1, S2 normal. No murmurs, rubs, or gallops. Irregular rhythm  ABDOMEN: Soft, nontender, nondistended. Bowel sounds present. No organomegaly or mass. left-sided nephrostomy tube + EXTREMITIES: No cyanosis, clubbing,+edema b/l.    NEUROLOGIC: Cranial nerves II through XII are intact. No focal Motor or sensory deficits b/l.   PSYCHIATRIC:  patient is alert and oriented x 2 SKIN: No obvious rash, lesion, or ulcer.   LABORATORY PANEL:  CBC Recent Labs  Lab 12/31/17 0438  WBC 7.7  HGB 14.9  HCT 46.0  PLT 104*    Chemistries  Recent Labs  Lab 12/26/17 0842  01/01/18 0534  NA 133*   < > 137  K 4.9   < > 5.0  CL 97*   < > 101  CO2 24   < > 26  GLUCOSE 118*   < > 143*  BUN 51*   < > 53*  CREATININE 2.66*   < > 1.66*  CALCIUM 9.6   < > 9.7  MG  --    < > 2.3  AST 28  --   --   ALT 20  --   --   ALKPHOS 90  --   --   BILITOT 1.8*  --   --    < > = values in this interval not displayed.   Cardiac Enzymes Recent Labs  Lab 12/31/17 0438  TROPONINI 0.20*   RADIOLOGY:  Dg Chest 1 View  Result Date: 12/30/2017 CLINICAL DATA:  Pitting edema. EXAM: CHEST 1 VIEW COMPARISON:  12/26/2017  FINDINGS: Cardiomediastinal silhouette is enlarged. Calcific atherosclerotic disease of the aorta. Mediastinal contours appear intact. There is no evidence of pneumothorax. Bilateral pleural effusions. Bibasilar atelectasis versus airspace consolidation. Probable interstitial pulmonary edema superimposed on emphysematous changes. Osseous structures are without acute abnormality. Soft tissues are grossly normal. IMPRESSION: Bilateral pleural effusions with interstitial pulmonary edema superimposed on chronic emphysematous changes. Bibasilar atelectasis versus airspace consolidation. Enlarged cardiac silhouette.  Electronically Signed   By: Ted Mcalpine M.D.   On: 12/30/2017 19:09   Dg Abdomen 1 View  Result Date: 12/30/2017 CLINICAL DATA:  Bilateral edema. EXAM: ABDOMEN - 1 VIEW COMPARISON:  December 27, 2017 FINDINGS: A left-sided catheter, consistent with a nephrostomy tube is identified. Left renal stones noted. A large stone in the left ureter is again noted. Air-filled loops of large and small bowel are again identified, similar in the interval. No free air, portal venous gas, or pneumatosis. There may be mild patchy opacity in the right mid lung. IMPRESSION: 1. Persistent air-filled dilated loops of large and small bowel. This could be seen with colonic obstruction or ileus. 2. Left nephrostomy tube.  Left ureteral stone. 3. Possible mild patchy opacity in the right mid lung, incompletely evaluated. Electronically Signed   By: Gerome Sam III M.D   On: 12/30/2017 19:09   Dg Chest Port 1 View  Result Date: 12/31/2017 CLINICAL DATA:  77 year old male with history of pulmonary edema. EXAM: PORTABLE CHEST 1 VIEW COMPARISON:  Chest x-ray 12/30/2017. FINDINGS: There is cephalization of the pulmonary vasculature and slight indistinctness of the interstitial markings suggestive of mild pulmonary edema. Mild emphysematous changes. Small bilateral pleural effusions. Mild cardiomegaly. Upper mediastinal contours are within normal limits. Aortic atherosclerosis. IMPRESSION: 1. The appearance the chest is most compatible with mild congestive heart failure, as above. No significant change. 2. Emphysema. Electronically Signed   By: Trudie Reed M.D.   On: 12/31/2017 07:10   ASSESSMENT AND PLAN:  Emeril Stille  is a 77 y.o. male with a known history of CHF- EF 50% in recent echo, COPD, Ch A fib, Htn, HLD, PE, recent admissions twice- first for CHF and then for renal failure due to Ureteral stone- s/p nephrostomy tube and d/c yesterday to rehab.  PT Was very SOB, so sent  to ER today. At baseline pt is on 3  ltr oxygen. Required Bipap in ER due to CHF, have A fib with RVR and on cardizem drip  1.  Acute chronic hypoxic respiratory failure secondary to congestive heart failure acute on chronic diastolic -Currently is off BiPAP - IV Lasix 40 twice daily, monitor I's and O's, monitor creatinine -Echo showed EF of 50%  2.  A. fib with RVR -Rate in the 90s.  Patient is on Cardizem drip and digoxin -Change to oral meds when improved -Cardiology consultation -Defer anticoagulation to allergy  3.  Acute on chronic renal failure CKD stage III -Baseline creatinine 1.3 -Came in with creatinine 1.9--- 1.7-- 1.66  4.  Obstructive uropathy with left ureteral stone and status post left nephrostomy tube placement -We will follow with urology as outpatient -pt developed urinary retention and had some issues with blood clots in the foley --irrigated now draining well  5.  Diabetes -Sliding scale insulin  6.  DVT prophylaxis subcu Lovenox   Case discussed with Care Management/Social Worker. Management plans discussed with the patient, family and they are in agreement.  CODE STATUS: Full  DVT Prophylaxis: Lovenox  TOTAL TIME TAKING CARE OF THIS PATIENT: 30 minutes.  >  50% time spent on counselling and coordination of care with patient and wife  POSSIBLE D/C IN 2-3 DAYS, DEPENDING ON CLINICAL CONDITION.  Note: This dictation was prepared with Dragon dictation along with smaller phrase technology. Any transcriptional errors that result from this process are unintentional.  Enedina Finner M.D on 01/01/2018 at 2:25 PM  Between 7am to 6pm - Pager - [phone]  After 6pm go to [email or web address] - Social research officer, government  [facility]  Office  [phone]  CC: Primary care physician; [facility], IncPatient ID: Benito Mccreedy, male   DOB: 1941-01-07, 77 y.o.   MRN: [id number]

## 2018-01-01 NOTE — Consult Note (Signed)
PULMONARY / CRITICAL CARE MEDICINE   Name: Nova Evett MRN: [id number] DOB: November 21, 1941    ADMISSION DATE:  12/30/2017   CONSULTATION DATE: 01/20/ 2019  REFERRING MD:  Dr. Elisabeth Pigeon  Reason: A. 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  During hospitalization his diuretic, diltiazem and digoxin were discontinued in light of renal failure and sepsis with hypotension secondary to obstructive uropathy and UTI.  Today patient presented to the ED with complaints of worsening lower extremity edema and shortness of breath.  Upon EMS arrival, patient was on 4 L of oxygen via nasal cannula and his SPO2 was between 84 and 85%.  At the ED, his workup showed an increase in his proBNP from 618 to 1225.  His heart rate was also elevated and his EKG showed A. fib with RVR.  He was started on a diltiazem infusion.  He is being admitted to the ICU for further management.  SUBJECTIVE Remains SOB Increased WOB Will give 60 IV lasix this AM On ventimask Cardiology recs pending ECHO pending Severe resp distress  REVIEW OF SYSTEMS:   Unable to obtain as patient is on continuous BiPAP  SUBJECTIVE:   VITAL SIGNS: BP (!) 118/97   Pulse 83   Temp 97.6 F (36.4 C) (Axillary)   Resp 14   Ht 6' (1.829 m)   Wt 200 lb (90.7 kg)   SpO2 99%   BMI 27.12 kg/m   VENTILATOR SETTINGS: FiO2 (%):  [40 %] 40 %  INTAKE / OUTPUT: I/O last 3 completed shifts: In: 563.6 [P.O.:500; I.V.:63.6] Out: 1450 [Urine:1450]  PHYSICAL EXAMINATION: General: Chronically ill looking Neuro: Alert and oriented x2, follows commands, moves  all extremities HEENT: PERRLA, mild JVD, neck is supple with good range of motion Cardiovascular: Apical pulse irregular irregular, S1-S2, no murmur regurg or gallop, +2 pulses in bilateral upper extremities, +1 in bilateral lower extremities Lungs: Normal work of breathing, bilateral breath sounds without any wheezes or rhonchi, significantly diminished in the bases Abdomen: Distended with nephrostomy tube in place, normal bowel sounds Musculoskeletal: No joint deformities, positive range of motion Skin: Warm and dry, severe venous stasis discoloration in bilateral lower extremities  LABS:  BMET Recent Labs  Lab 12/30/17 1831 12/31/17 0024 12/31/17 0438 01/01/18 0534  NA 135  --  138 137  K 4.9  --  4.9 5.0  CL 99*  --  103 101  CO2 23  --  24 26  BUN 54*  --  54* 53*  CREATININE 1.87* 1.94* 1.72* 1.66*  GLUCOSE 141*  --  102* 143*    Electrolytes Recent Labs  Lab 12/30/17 1831 12/31/17 0024 12/31/17 0438 01/01/18 0534  CALCIUM 9.5  --  9.3 9.7  MG  --  2.3  --  2.3  PHOS  --  3.8  --  2.8    CBC Recent Labs  Lab 12/30/17 1831 12/31/17 0024 12/31/17 0438  WBC 10.0 8.8 7.7  HGB 16.8 14.9 14.9  HCT 52.7* 46.7 46.0  PLT 113* 110* 104*    Coag's Recent Labs  Lab 12/26/17 0842  INR 1.31    Sepsis Markers Recent Labs  Lab 12/26/17 0955 12/26/17 1231  LATICACIDVEN 2.2* 2.3*    ABG No results for input(s): PHART, PCO2ART, PO2ART in the last 168 hours.  Liver Enzymes Recent Labs  Lab 12/26/17 0842  AST 28  ALT 20  ALKPHOS 90  BILITOT 1.8*  ALBUMIN 3.4*    Cardiac Enzymes Recent Labs  Lab 12/30/17 2010 12/31/17 0024 12/31/17 0438  TROPONINI 0.18* 0.21* 0.20*    Glucose Recent Labs  Lab 12/31/17 0757 12/31/17 0823 12/31/17 1121 12/31/17 1638 12/31/17 2130 01/01/18 0729  GLUCAP 62* 176* 146* 196* 167* 133*    Imaging No results found. SIGNIFICANT EVENTS: 01/19 Discharged 01/20 Re-admitted  LINES/TUBES: PIVs Nephrostomy  tube  DISCUSSION: 77 year old male with multiple comorbidities admitted with acute CHF exacerbation, acute hypoxic respiratory failure and A. fib with RVR necessitating IV diltiazem, high risk for cardiac arrest and death  ASSESSMENT Afib with RVR Acute CHF exacerbation Acute hypoxic respiratory failure Recent obstructive uropathy s/p left ureteral stent and nephrostomy H/O HTN, COPD, T2DM, GERD, pulmonary hypertension, and PVD Current tobacco use   PLAN Continuous BiPAP and titrate to nasal cannula as tolerated-high risk for intubation Nebulized bronchodilators Continue diltiazem drip and transition to oral diltiazem Resume digoxin IV diuresis Nebulized bronchodilators Monitor and correct electrolytes Blood glucose monitoring with sliding scale insulin coverage GI and DVT prophylaxis Smoking cessation offered; continue nicotine patch  High risk for cardiac arrest and death  Critical Care Time devoted to patient care services described in this note is 45 minutes.   Overall, patient is critically ill, prognosis is guarded.  Patient with Multiorgan failure and at high risk for cardiac arrest and death.    Lucie LeatherKurian David Janella Rogala, M.D.  [facility]  Medical Director [facility] Medical Director [facility] Cardio-Pulmonary Department

## 2018-01-01 NOTE — Progress Notes (Signed)
SUBJECTIVE: Patient is less short of breath but denies any chest pain   Vitals:   01/01/18 0400 01/01/18 0500 01/01/18 0600 01/01/18 0848  BP: 115/89 118/85 (!) 118/97 102/84  Pulse: 90 86 83 100  Resp: 16 (!) 24 14 (!) 26  Temp:    (!) 97 F (36.1 C)  TempSrc:    Axillary  SpO2: 95% 100% 99% 92%  Weight:      Height:        Intake/Output Summary (Last 24 hours) at 01/01/2018 0851 Last data filed at 01/01/2018 0850 Gross per 24 hour  Intake 506 ml  Output 725 ml  Net -219 ml    LABS: Basic Metabolic Panel: Recent Labs    12/31/17 0024 12/31/17 0438 01/01/18 0534  NA  --  138 137  K  --  4.9 5.0  CL  --  103 101  CO2  --  24 26  GLUCOSE  --  102* 143*  BUN  --  54* 53*  CREATININE 1.94* 1.72* 1.66*  CALCIUM  --  9.3 9.7  MG 2.3  --  2.3  PHOS 3.8  --  2.8   Liver Function Tests: No results for input(s): AST, ALT, ALKPHOS, BILITOT, PROT, ALBUMIN in the last 72 hours. No results for input(s): LIPASE, AMYLASE in the last 72 hours. CBC: Recent Labs    12/30/17 1831 12/31/17 0024 12/31/17 0438  WBC 10.0 8.8 7.7  NEUTROABS 8.8*  --   --   HGB 16.8 14.9 14.9  HCT 52.7* 46.7 46.0  MCV 92.3 92.1 91.9  PLT 113* 110* 104*   Cardiac Enzymes: Recent Labs    12/30/17 2010 12/31/17 0024 12/31/17 0438  TROPONINI 0.18* 0.21* 0.20*   BNP: Invalid input(s): POCBNP D-Dimer: No results for input(s): DDIMER in the last 72 hours. Hemoglobin A1C: No results for input(s): HGBA1C in the last 72 hours. Fasting Lipid Panel: No results for input(s): CHOL, HDL, LDLCALC, TRIG, CHOLHDL, LDLDIRECT in the last 72 hours. Thyroid Function Tests: No results for input(s): TSH, T4TOTAL, T3FREE, THYROIDAB in the last 72 hours.  Invalid input(s): FREET3 Anemia Panel: No results for input(s): VITAMINB12, FOLATE, FERRITIN, TIBC, IRON, RETICCTPCT in the last 72 hours.   PHYSICAL EXAM General: Well developed, well nourished, in no acute distress HEENT:  Normocephalic and  atramatic Neck:  No JVD.  Lungs: Clear bilaterally to auscultation and percussion. Heart: HRRR . Normal S1 and S2 without gallops or murmurs.  Abdomen: Bowel sounds are positive, abdomen soft and non-tender  Msk:  Back normal, normal gait. Normal strength and tone for age. Extremities: No clubbing, cyanosis or edema.   Neuro: Alert and oriented X 3. Psych:  Good affect, responds appropriately  TELEMETRY: Atrial fibrillation about 90 bpm  ASSESSMENT AND PLAN: Atrial fibrillation with controlled ventricular rate now. Patient has on echocardiogram normal left reticular systolic function but severe pulmonary hypertension with severely dilated right atrium and right ventricle and left atrium. Pulmonary artery pressures were 81 mm or mercury. Digoxin may be more helpful and pulmonary hypertension and pulmonary showed consider other treatment for pulmonary hypertension which is probably secondary hypertension due to COPD.  Principal Problem:   Acute respiratory failure with hypoxia (HCC) Active Problems:   Acute on chronic diastolic CHF (congestive heart failure) (HCC)   Atrial fibrillation with RVR (HCC)    Adrian BlackwaterKHAN,Percy Comp A, MD, [facility] 01/01/2018 8:51 AM

## 2018-01-01 NOTE — Care Management Note (Signed)
Case Management Note  Patient Details  Name: Austin [name] MRN: [id number] Date of Birth: 1941-08-06  Subjective/Objective:                  RNCM met with patient's wife Austin [name] as ICU RN place urinary Foley with patient.  Patient is from [facility] for rehab.  Wife is considering taking patient home if she can manage him post hospitalization.  She states he has a rollator, front wheeled walker, and bedside commode available for use at home.  His PCP is in [place].  He has chronic supplemental O2 at home through [facility].  She states he has used [employer] in the past and would like to use them again when he returns to home.   Action/Plan:  Alyse Low with [facility] notified and will follow.     Expected Discharge Date:  01/02/18               Expected Discharge Plan:     In-House Referral:     Discharge planning Services  CM Consult  Post Acute Care Choice:  Home Health Choice offered to:  Spouse  DME Arranged:    DME Agency:     HH Arranged:  RN, PT [place] Agency:  [place]  Status of Service:  In process, will continue to follow  If discussed at Long Length of Stay Meetings, dates discussed:    Additional Comments:  Marshell Garfinkel, RN 01/01/2018, 11:11 AM

## 2018-01-01 NOTE — Progress Notes (Signed)
Late note 1413 8 beat run of v-tach noted on monitor. Patient was asleep. Dr.Kasa notified. No orders received.

## 2018-01-02 LAB — BASIC METABOLIC PANEL
Anion gap: 10 (ref 5–15)
BUN: 51 mg/dL — ABNORMAL HIGH (ref 6–20)
CALCIUM: 9.6 mg/dL (ref 8.9–10.3)
CO2: 26 mmol/L (ref 22–32)
CREATININE: 1.68 mg/dL — AB (ref 0.61–1.24)
Chloride: 97 mmol/L — ABNORMAL LOW (ref 101–111)
GFR calc Af Amer: 44 mL/min — ABNORMAL LOW (ref >60)
GFR calc non Af Amer: 38 mL/min — ABNORMAL LOW (ref >60)
GLUCOSE: 242 mg/dL — AB (ref 65–99)
Potassium: 5.2 mmol/L — ABNORMAL HIGH (ref 3.5–5.1)
Sodium: 133 mmol/L — ABNORMAL LOW (ref 135–145)

## 2018-01-02 LAB — GLUCOSE, CAPILLARY
Glucose-Capillary: 204 mg/dL — ABNORMAL HIGH (ref 65–99)
Glucose-Capillary: 223 mg/dL — ABNORMAL HIGH (ref 65–99)
Glucose-Capillary: 239 mg/dL — ABNORMAL HIGH (ref 65–99)
Glucose-Capillary: 155 mg/dL — ABNORMAL HIGH (ref 65–99)

## 2018-01-02 LAB — CBC
HCT: 47.2 % (ref 40.0–52.0)
Hemoglobin: 14.8 g/dL (ref 13.0–18.0)
MCH: 29.2 pg (ref 26.0–34.0)
MCHC: 31.3 g/dL — ABNORMAL LOW (ref 32.0–36.0)
MCV: 93.4 fL (ref 80.0–100.0)
PLATELETS: 151 10*3/uL (ref 150–440)
RBC: 5.05 MIL/uL (ref 4.40–5.90)
RDW: 17.6 % — AB (ref 11.5–14.5)
WBC: 8.8 10*3/uL (ref 3.8–10.6)

## 2018-01-02 MED ORDER — INSULIN GLARGINE 100 UNIT/ML ~~LOC~~ SOLN
5.0000 [IU] | Freq: Every day | SUBCUTANEOUS | Status: DC
  Administered 2018-01-02 – 2018-01-03 (×2): 5 [IU] via SUBCUTANEOUS
  Filled 2018-01-02 (×2): qty 0.05

## 2018-01-02 MED ORDER — DILTIAZEM HCL 30 MG PO TABS
60.0000 mg | ORAL_TABLET | Freq: Three times a day (TID) | ORAL | Status: AC
  Administered 2018-01-02: 60 mg via ORAL
  Filled 2018-01-02: qty 2

## 2018-01-02 MED ORDER — DILTIAZEM HCL ER COATED BEADS 180 MG PO CP24
180.0000 mg | ORAL_CAPSULE | Freq: Every day | ORAL | Status: DC
  Administered 2018-01-03 – 2018-01-07 (×5): 180 mg via ORAL
  Filled 2018-01-02 (×4): qty 1

## 2018-01-02 NOTE — Progress Notes (Signed)
Patient continues to have blood-tinged urine output. Nephrostomy urine is clear and yellow. Patient took the initiative to want the Bi-Pap mask off most of the shift. He did become "air hungry" a few times. While sating well, he requested for his oxygen to be increased.

## 2018-01-02 NOTE — Progress Notes (Signed)
[facility] - [place] at [facility]   PATIENT NAME: Austin [name]    MR#:  [id number]  DATE OF BIRTH:  1941-04-19  SUBJECTIVE:   Patient now on nasal cannula oxygen sats are more than 90% on room air.    Denies any complaints.  Mild shortness of breath. HR much improved---off cardizem gtt REVIEW OF SYSTEMS:   Review of Systems  Constitutional: Negative for chills, fever and weight loss.  HENT: Negative for ear discharge, ear pain and nosebleeds.   Eyes: Negative for blurred vision, pain and discharge.  Respiratory: Positive for shortness of breath. Negative for sputum production, wheezing and stridor.   Cardiovascular: Negative for chest pain, palpitations, orthopnea and PND.  Gastrointestinal: Negative for abdominal pain, diarrhea, nausea and vomiting.  Genitourinary: Negative for frequency and urgency.  Musculoskeletal: Negative for back pain and joint pain.  Neurological: Positive for weakness. Negative for sensory change, speech change and focal weakness.  Psychiatric/Behavioral: Negative for depression and hallucinations. The patient is not nervous/anxious.    Tolerating Diet:yes Tolerating PT: SNF  DRUG ALLERGIES:   Allergies  Allergen Reactions  . Contrast Media [Iodinated Diagnostic Agents] Nausea And Vomiting  . Other Other (See Comments)    Cannot have "high doses of anesthesia because of his lungs"    VITALS:  Blood pressure 128/82, pulse 76, temperature (!) 97.5 F (36.4 C), temperature source Oral, resp. rate (!) 25, height 6' (1.829 m), weight 90.7 kg (200 lb), SpO2 92 %.  PHYSICAL EXAMINATION:   Physical Exam  GENERAL:  77 y.o.-year-old patient lying in the bed with no acute distress.  Appears chronically ill EYES: Pupils equal, round, reactive to light and accommodation. No scleral icterus. Extraocular muscles intact.  HEENT: Head atraumatic, normocephalic. Oropharynx and nasopharynx clear. poor Dentition NECK:  Supple, no  jugular venous distention. No thyroid enlargement, no tenderness.  LUNGS:decreased breath sounds bilaterally, no wheezing, rales, rhonchi. No use of accessory muscles of respiration.  CARDIOVASCULAR: S1, S2 normal. No murmurs, rubs, or gallops. Irregular rhythm  ABDOMEN: Soft, nontender, nondistended. Bowel sounds present. No organomegaly or mass. left-sided nephrostomy tube +, Foley ++ EXTREMITIES: No cyanosis, clubbing,+edema b/l.    NEUROLOGIC: Cranial nerves II through XII are intact. No focal Motor or sensory deficits b/l.   PSYCHIATRIC:  patient is alert and oriented x 2 SKIN: No obvious rash, lesion, or ulcer.   LABORATORY PANEL:  CBC Recent Labs  Lab 01/02/18 0417  WBC 8.8  HGB 14.8  HCT 47.2  PLT 151    Chemistries  Recent Labs  Lab 01/01/18 0534 01/02/18 0417  NA 137 133*  K 5.0 5.2*  CL 101 97*  CO2 26 26  GLUCOSE 143* 242*  BUN 53* 51*  CREATININE 1.66* 1.68*  CALCIUM 9.7 9.6  MG 2.3  --    Cardiac Enzymes Recent Labs  Lab 12/31/17 0438  TROPONINI 0.20*   RADIOLOGY:  No results found. ASSESSMENT AND PLAN:  Hendrixx Severin  is a 77 y.o. male with a known history of CHF- EF 50% in recent echo, COPD, Ch A fib, Htn, HLD, PE, recent admissions twice- first for CHF and then for renal failure due to Ureteral stone- s/p nephrostomy tube and d/c yesterday to rehab.  PT Was very SOB, so sent  to ER today. At baseline pt is on 3 ltr oxygen. Required Bipap in ER due to CHF, have A fib with RVR and on cardizem drip  1.  Acute chronic hypoxic respiratory failure secondary to  congestive heart failure acute on chronic diastolic -Currently is off BiPAP - IV Lasix 40 twice daily, monitor I's and O's, monitor creatinine -Echo showed EF of 50% -good Uop  2.  A. fib with RVR -Rate in the 90s.  Patient is on Cardizem and digoxin -Cardiology consultation noted -Defer anticoagulation  Due to hemtauria  3.  Acute on chronic renal failure CKD stage III -Baseline creatinine  1.3 -Came in with creatinine 1.9--- 1.7-- 1.66  4.  Obstructive uropathy with left ureteral stone and status post left nephrostomy tube placement -We will follow with urology as outpatient -pt developed urinary retention and had some issues with blood clots in the foley --irrigated now draining well -consider d/c foley in am  5.  Diabetes -Sliding scale insulin  6.  DVT prophylaxis subcu Lovenox  D/w wife Case discussed with Care Management/Social Worker. Management plans discussed with the patient, family and they are in agreement.  CODE STATUS: Full  DVT Prophylaxis: Lovenox  TOTAL TIME TAKING CARE OF THIS PATIENT: 30 minutes.  >50% time spent on counselling and coordination of care with patient and wife  POSSIBLE D/C IN 2-3 DAYS, DEPENDING ON CLINICAL CONDITION.  Note: This dictation was prepared with Dragon dictation along with smaller phrase technology. Any transcriptional errors that result from this process are unintentional.  Enedina FinnerSona Burhanuddin Kohlmann M.D on 01/02/2018 at 1:55 PM  Between 7am to 6pm - Pager - [phone]  After 6pm go to [email or web address] - Social research officer, governmentpassword EPAS [facility]  [facility]  Office  [phone]  CC: Primary care physician; [facility], IncPatient ID: Benito MccreedyJames Allen Keating, male   DOB: 02/27/1941, 77 y.o.   MRN: [id number]

## 2018-01-02 NOTE — Progress Notes (Signed)
Patient in room from CCU. VSS. Tele box on. No complaints at this time. Patient in lowest position with call light in reach.

## 2018-01-02 NOTE — Progress Notes (Signed)
SUBJECTIVE: Pt reports no chest pain but shortness of breath comes and goes.    Vitals:   01/02/18 0500 01/02/18 0529 01/02/18 0600 01/02/18 0800  BP: 124/82 124/82 120/90 105/74  Pulse: 72  72 73  Resp: 18  19 20   Temp:    (!) 97.5 F (36.4 C)  TempSrc:    Oral  SpO2: (!) 86%  98% 96%  Weight:      Height:        Intake/Output Summary (Last 24 hours) at 01/02/2018 0846 Last data filed at 01/02/2018 0800 Gross per 24 hour  Intake 985 ml  Output 1490 ml  Net -505 ml    LABS: Basic Metabolic Panel: Recent Labs    12/31/17 0024 12/31/17 0438 01/01/18 0534  NA  --  138 137  K  --  4.9 5.0  CL  --  103 101  CO2  --  24 26  GLUCOSE  --  102* 143*  BUN  --  54* 53*  CREATININE 1.94* 1.72* 1.66*  CALCIUM  --  9.3 9.7  MG 2.3  --  2.3  PHOS 3.8  --  2.8   Liver Function Tests: No results for input(s): AST, ALT, ALKPHOS, BILITOT, PROT, ALBUMIN in the last 72 hours. No results for input(s): LIPASE, AMYLASE in the last 72 hours. CBC: Recent Labs    12/30/17 1831  12/31/17 0438 01/02/18 0417  WBC 10.0   < > 7.7 8.8  NEUTROABS 8.8*  --   --   --   HGB 16.8   < > 14.9 14.8  HCT 52.7*   < > 46.0 47.2  MCV 92.3   < > 91.9 93.4  PLT 113*   < > 104* 151   < > = values in this interval not displayed.   Cardiac Enzymes: Recent Labs    12/30/17 2010 12/31/17 0024 12/31/17 0438  TROPONINI 0.18* 0.21* 0.20*   BNP: Invalid input(s): POCBNP D-Dimer: No results for input(s): DDIMER in the last 72 hours. Hemoglobin A1C: No results for input(s): HGBA1C in the last 72 hours. Fasting Lipid Panel: No results for input(s): CHOL, HDL, LDLCALC, TRIG, CHOLHDL, LDLDIRECT in the last 72 hours. Thyroid Function Tests: No results for input(s): TSH, T4TOTAL, T3FREE, THYROIDAB in the last 72 hours.  Invalid input(s): FREET3 Anemia Panel: No results for input(s): VITAMINB12, FOLATE, FERRITIN, TIBC, IRON, RETICCTPCT in the last 72 hours.   PHYSICAL EXAM General: Well developed,  well nourished, in no acute distress HEENT:  Normocephalic and atramatic Neck:  No JVD.  Lungs: Clear bilaterally to auscultation and percussion. Heart: HRRR . Normal S1 and S2 without gallops or murmurs.  Abdomen: Bowel sounds are positive, abdomen soft and non-tender  Msk:  Back normal, normal gait. Normal strength and tone for age. Extremities: No clubbing, cyanosis or edema.   Neuro: Alert and oriented X 3. Psych:  Good affect, responds appropriately  TELEMETRY: Afib rate controlled 78bpm  ASSESSMENT AND PLAN: Atrial fibrillation rate controlled. 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 Advise continuing digoxin and diltiazem for rate control.  Principal Problem:   Acute respiratory failure with hypoxia (HCC) Active Problems:   Acute on chronic diastolic CHF (congestive heart failure) (HCC)   Atrial fibrillation with RVR (HCC)    Austin HammanKristin Jaycion Treml, NP-C 01/02/2018 8:46 AM Cell: [phone]

## 2018-01-02 NOTE — Progress Notes (Signed)
Physical Therapy Treatment Patient Details Name: Austin [name] MRN: [id number] DOB: [birth year] Today's Date: 01/02/2018    History of Present Illness presented to ER from STR secondary to SOB, respiratory distress; admitted with acute respiratory failure with hypoxia secondary to CHF, Afib with RVR (initially requiring cardizem drip).  Of note, patient with two recent hospitalizations secondary to CHF (1) and renal failure related to ureteral stone s/p nephrostomy tube placement (2)    PT Comments    Pt lethargic appearing, but agreeable to PT; speaks little and keeps eyes closed. Pt states "difficulty getting my breath". O2 saturation initially between 88 -95%; RR between 28-33 breaths per minute and HR stable mid 70's. With mild exertion during supine bed exercises O2 saturation decreases as low as 85%; RR increases up to 39. Rest breaks required after each set of 10 and occasionally during set; encouraged slow deep pursed lip breathing with improvement to 90-95% O2 saturation and RR of 25-28. Pt fatigued post exercises and lunch tray arrived. Per MD, pt to move to regular floor this afternoon. Continue PT to progress strength and endurance with safe O2 saturation and RR levels to allow for up/out of bed function.    Follow Up Recommendations  SNF     Equipment Recommendations  Rolling walker with 5" wheels    Recommendations for Other Services       Precautions / Restrictions Precautions Precautions: Fall Restrictions Weight Bearing Restrictions: No    Mobility  Bed Mobility               General bed mobility comments: Not tested due to level of lethargy and increased RR with minimal exertion  Transfers                    Ambulation/Gait                 Stairs            Wheelchair Mobility    Modified Rankin (Stroke Patients Only)       Balance                                            Cognition  Arousal/Alertness: Lethargic Behavior During Therapy: Flat affect;WFL for tasks assessed/performed Overall Cognitive Status: Within Functional Limits for tasks assessed                                 General Comments: speaks only when spoken to      Exercises General Exercises - Lower Extremity Ankle Circles/Pumps: AROM;Both;20 reps;Supine Quad Sets: Strengthening;Both;10 reps;Supine Gluteal Sets: Other (comment)(attempted; does not comprehend exercise or unable) Short Arc Quad: AROM;Both;10 reps;Supine;AAROM(needs gentle assist occasionally to stay on task) Heel Slides: AAROM;Both;10 reps;Supine(assist to keep heels of bed ) Hip ABduction/ADduction: AAROM;Both;10 reps;Supine(asssit to keep heels off bed)    General Comments        Pertinent Vitals/Pain Pain Assessment: No/denies pain    Home Living                      Prior Function            PT Goals (current goals can now be found in the care plan section) Progress towards PT goals: Progressing toward goals(slowly)    Frequency  Min 2X/week      PT Plan Current plan remains appropriate    Co-evaluation              AM-PAC PT "6 Clicks" Daily Activity  Outcome Measure  Difficulty turning over in bed (including adjusting bedclothes, sheets and blankets)?: Unable Difficulty moving from lying on back to sitting on the side of the bed? : Unable Difficulty sitting down on and standing up from a chair with arms (e.g., wheelchair, bedside commode, etc,.)?: Unable Help needed moving to and from a bed to chair (including a wheelchair)?: Total Help needed walking in hospital room?: Total Help needed climbing 3-5 steps with a railing? : Total 6 Click Score: 6    End of Session Equipment Utilized During Treatment: Oxygen Activity Tolerance: Patient limited by fatigue;Other (comment)(increased RR and decreased O2 saturation) Patient left: in bed;with call bell/phone within reach;with  bed alarm set;with family/visitor present   PT Visit Diagnosis: Muscle weakness (generalized) (M62.81);Difficulty in walking, not elsewhere classified (R26.2)     Time: [id number] PT Time Calculation (min) (ACUTE ONLY): 23 min  Charges:  $Therapeutic Exercise: 23-37 mins                    G CodesScot Dock:        Austin [name], PTA 01/02/2018, 12:42 PM

## 2018-01-02 NOTE — Progress Notes (Signed)
Report given to 2A. Patient to transfer shortly with telemetry.  Austin KusterBrandi R [name]

## 2018-01-03 LAB — GLUCOSE, CAPILLARY
GLUCOSE-CAPILLARY: 241 mg/dL — AB (ref 65–99)
GLUCOSE-CAPILLARY: 165 mg/dL — AB (ref 65–99)
Glucose-Capillary: 139 mg/dL — ABNORMAL HIGH (ref 65–99)
Glucose-Capillary: 246 mg/dL — ABNORMAL HIGH (ref 65–99)

## 2018-01-03 MED ORDER — SODIUM CHLORIDE 0.9% FLUSH
3.0000 mL | Freq: Two times a day (BID) | INTRAVENOUS | Status: DC
  Administered 2018-01-03 – 2018-01-05 (×5): 3 mL via INTRAVENOUS

## 2018-01-03 MED ORDER — IPRATROPIUM-ALBUTEROL 0.5-2.5 (3) MG/3ML IN SOLN
3.0000 mL | Freq: Three times a day (TID) | RESPIRATORY_TRACT | Status: DC
  Administered 2018-01-03 – 2018-01-07 (×12): 3 mL via RESPIRATORY_TRACT
  Filled 2018-01-03 (×13): qty 3

## 2018-01-03 MED ORDER — INSULIN GLARGINE 100 UNIT/ML ~~LOC~~ SOLN
8.0000 [IU] | Freq: Every day | SUBCUTANEOUS | Status: DC
  Administered 2018-01-04 – 2018-01-07 (×4): 8 [IU] via SUBCUTANEOUS
  Filled 2018-01-03 (×6): qty 0.08

## 2018-01-03 MED ORDER — IPRATROPIUM-ALBUTEROL 0.5-2.5 (3) MG/3ML IN SOLN
3.0000 mL | Freq: Four times a day (QID) | RESPIRATORY_TRACT | Status: DC | PRN
  Administered 2018-01-03: 3 mL via RESPIRATORY_TRACT
  Filled 2018-01-03: qty 3

## 2018-01-03 NOTE — Progress Notes (Signed)
Placed on Bipap @ pts request

## 2018-01-03 NOTE — Progress Notes (Signed)
[facility] - [place] at [facility]   PATIENT NAME: Austin [name]    MR#:  [id number]  DATE OF BIRTH:  1941-01-06  SUBJECTIVE:   Patient now on nasal cannula oxygen sats are more than 90% on room air.    Denies any complaints. shortness of breath with minimal activity HR much improved---off cardizem gtt REVIEW OF SYSTEMS:   Review of Systems  Constitutional: Negative for chills, fever and weight loss.  HENT: Negative for ear discharge, ear pain and nosebleeds.   Eyes: Negative for blurred vision, pain and discharge.  Respiratory: Positive for shortness of breath. Negative for sputum production, wheezing and stridor.   Cardiovascular: Negative for chest pain, palpitations, orthopnea and PND.  Gastrointestinal: Negative for abdominal pain, diarrhea, nausea and vomiting.  Genitourinary: Negative for frequency and urgency.  Musculoskeletal: Negative for back pain and joint pain.  Neurological: Positive for weakness. Negative for sensory change, speech change and focal weakness.  Psychiatric/Behavioral: Negative for depression and hallucinations. The patient is not nervous/anxious.    Tolerating Diet:yes Tolerating PT: SNF  DRUG ALLERGIES:   Allergies  Allergen Reactions  . Contrast Media [Iodinated Diagnostic Agents] Nausea And Vomiting  . Other Other (See Comments)    Cannot have "high doses of anesthesia because of his lungs"    VITALS:  Blood pressure 124/88, pulse 82, temperature 97.6 F (36.4 C), temperature source Oral, resp. rate 18, height 6' (1.829 m), weight 89.9 kg (198 lb 3.2 oz), SpO2 91 %.  PHYSICAL EXAMINATION:   Physical Exam  GENERAL:  77 y.o.-year-old patient lying in the bed with no acute distress.  Appears chronically ill EYES: Pupils equal, round, reactive to light and accommodation. No scleral icterus. Extraocular muscles intact.  HEENT: Head atraumatic, normocephalic. Oropharynx and nasopharynx clear. poor Dentition NECK:   Supple, no jugular venous distention. No thyroid enlargement, no tenderness.  LUNGS:decreased breath sounds bilaterally, no wheezing, rales, rhonchi. No use of accessory muscles of respiration.  CARDIOVASCULAR: S1, S2 normal. No murmurs, rubs, or gallops. Irregular rhythm  ABDOMEN: Soft, nontender, nondistended. Bowel sounds present. No organomegaly or mass. left-sided nephrostomy tube +, Foley ++ EXTREMITIES: No cyanosis, clubbing,+edema b/l.    NEUROLOGIC: Cranial nerves II through XII are intact. No focal Motor or sensory deficits b/l.   PSYCHIATRIC:  patient is alert and oriented x 2 SKIN: No obvious rash, lesion, or ulcer.   LABORATORY PANEL:  CBC Recent Labs  Lab 01/02/18 0417  WBC 8.8  HGB 14.8  HCT 47.2  PLT 151    Chemistries  Recent Labs  Lab 01/01/18 0534 01/02/18 0417  NA 137 133*  K 5.0 5.2*  CL 101 97*  CO2 26 26  GLUCOSE 143* 242*  BUN 53* 51*  CREATININE 1.66* 1.68*  CALCIUM 9.7 9.6  MG 2.3  --    Cardiac Enzymes Recent Labs  Lab 12/31/17 0438  TROPONINI 0.20*   RADIOLOGY:  No results found. ASSESSMENT AND PLAN:  Austin [name]  is a 77 y.o. male with a known history of CHF- EF 50% in recent echo, COPD, Ch A fib, Htn, HLD, PE, recent admissions twice- first for CHF and then for renal failure due to Ureteral stone- s/p nephrostomy tube and d/c yesterday to rehab.  PT Was very SOB, so sent  to ER today. At baseline pt is on 3 ltr oxygen. Required Bipap in ER due to CHF, have A fib with RVR and on cardizem drip  1.  Acute chronic hypoxic respiratory failure secondary  to congestive heart failure acute on chronic diastolic -Currently is off BiPAP - IV Lasix 40 twice daily, monitor I's and O's, monitor creatinine -Echo showed EF of 50% -good Uop  2.  A. fib with RVR -Rate in the 90s.  Patient is on Cardizem and digoxin -Cardiology consultation noted -Defer anticoagulation  Due to hemtauria  3.  Acute on chronic renal failure CKD stage III -Baseline  creatinine 1.3 -Came in with creatinine 1.9--- 1.7-- 1.66  4.  Obstructive uropathy with left ureteral stone and status post left nephrostomy tube placement -We will follow with urology as outpatient -pt developed urinary retention and had some issues with blood clots in the foley --irrigated now draining well -consider d/c foley in am  5.  Diabetes -Sliding scale insulin  6.  DVT prophylaxis subcu Lovenox  7.  Discharge planning to rehab when stable.    Will attempt to remove Foley catheter tomorrow  Case discussed with Care Management/Social Worker. Management plans discussed with the patient, family and they are in agreement.  CODE STATUS: Full  DVT Prophylaxis: Lovenox  TOTAL TIME TAKING CARE OF THIS PATIENT: 30 minutes.  >50% time spent on counselling and coordination of care with patient and wife  POSSIBLE D/C IN 1-2 DAYS, DEPENDING ON CLINICAL CONDITION.  Note: This dictation was prepared with Dragon dictation along with smaller phrase technology. Any transcriptional errors that result from this process are unintentional.  Enedina FinnerSona Maigan Bittinger M.D on 01/03/2018 at 7:59 PM  Between 7am to 6pm - Pager - [phone]  After 6pm go to [email or web address] - Social research officer, governmentpassword EPAS [facility]  [facility] [place] [facility]  Office  [phone]  CC: Primary care physician; [facility] ID: Austin [name], male   DOB: 1941/05/27, 77 y.o.   MRN: [id number]

## 2018-01-03 NOTE — Discharge Instructions (Signed)
Heart Failure Clinic appointment on January 10 2018 at 9:40am with Clarisa Kindredina Foxx Klarich, FNP. Please call [phone] to reschedule.

## 2018-01-03 NOTE — Care Management Important Message (Signed)
Important Message  Patient Details  Name: Austin MccreedyJames Allen Sebek MRN: [id number] Date of Birth: 02/01/1941   Medicare Important Message Given:  Yes    Gwenette GreetBrenda S Jaimie Pippins, RN 01/03/2018, 10:36 AM

## 2018-01-03 NOTE — Progress Notes (Signed)
Order for foley to be D/C'd. Notified Dr. Luberta MutterKonidena of patient's dyspnea at rest. Patient getting very short of breath with just turning in the bed. Will not be able to tolerate getting up to urinate or cleaning bed after incontinent episodes. Will leave foley in for now.

## 2018-01-03 NOTE — Progress Notes (Signed)
PT Cancellation Note  Patient Details Name: Austin [name] MRN: [id number] DOB: Jan 21, 1941   Cancelled Treatment:    Reason Eval/Treat Not Completed: Patient declined, no reason specified;Other (comment). Treatment attempted, pt with nursing staff currently, but refuses PT due to fatigue and difficulty "getting his breath". Re attempt tomorrow as the schedule allows.    Scot DockHeidi E Berwyn Bigley, PTA 01/03/2018, 3:22 PM

## 2018-01-03 NOTE — Progress Notes (Signed)
SUBJECTIVE: Pt is breathing heavily and appears short of breath on 2L oxygen. He reports he "had a rough night and couldn't catch his breath."   Vitals:   01/02/18 1945 01/02/18 2022 01/03/18 0450 01/03/18 0813  BP: 103/66  120/73 129/87  Pulse: 78  77 75  Resp: 18  20 18   Temp: 97.7 F (36.5 C)  97.7 F (36.5 C) 97.6 F (36.4 C)  TempSrc: Oral  Oral Oral  SpO2:  94% 94% 92%  Weight:      Height:        Intake/Output Summary (Last 24 hours) at 01/03/2018 0846 Last data filed at 01/03/2018 0451 Gross per 24 hour  Intake -  Output 1410 ml  Net -1410 ml    LABS: Basic Metabolic Panel: Recent Labs    01/01/18 0534 01/02/18 0417  NA 137 133*  K 5.0 5.2*  CL 101 97*  CO2 26 26  GLUCOSE 143* 242*  BUN 53* 51*  CREATININE 1.66* 1.68*  CALCIUM 9.7 9.6  MG 2.3  --   PHOS 2.8  --    Liver Function Tests: No results for input(s): AST, ALT, ALKPHOS, BILITOT, PROT, ALBUMIN in the last 72 hours. No results for input(s): LIPASE, AMYLASE in the last 72 hours. CBC: Recent Labs    01/02/18 0417  WBC 8.8  HGB 14.8  HCT 47.2  MCV 93.4  PLT 151   Cardiac Enzymes: No results for input(s): CKTOTAL, CKMB, CKMBINDEX, TROPONINI in the last 72 hours. BNP: Invalid input(s): POCBNP D-Dimer: No results for input(s): DDIMER in the last 72 hours. Hemoglobin A1C: No results for input(s): HGBA1C in the last 72 hours. Fasting Lipid Panel: No results for input(s): CHOL, HDL, LDLCALC, TRIG, CHOLHDL, LDLDIRECT in the last 72 hours. Thyroid Function Tests: No results for input(s): TSH, T4TOTAL, T3FREE, THYROIDAB in the last 72 hours.  Invalid input(s): FREET3 Anemia Panel: No results for input(s): VITAMINB12, FOLATE, FERRITIN, TIBC, IRON, RETICCTPCT in the last 72 hours.   PHYSICAL EXAM General: Increased work of breathing noted, lung congestion noted HEENT:  Normocephalic and atramatic Neck:  No JVD.  Lungs: Crackles in bases. Heart: HRRR . Normal S1 and S2 without gallops or  murmurs.  Abdomen: Bowel sounds are positive, abdomen soft and non-tender  Msk:  Back normal, normal gait. Normal strength and tone for age. Extremities: 2+ pitting pedal edema  Neuro: Alert and oriented X 3. Psych:  Good affect, responds appropriately  TELEMETRY: Afib rate controlled 80s  ASSESSMENT AND PLAN: Acute respiratory failure with normal LVEF but severe pulmonary hypertension. Continue digoxin and PO diltiazem. Continue IV lasix as LE edema is still significant. Continue respiratory support as needed.   Principal Problem:   Acute respiratory failure with hypoxia (HCC) Active Problems:   Acute on chronic diastolic CHF (congestive heart failure) (HCC)   Atrial fibrillation with RVR (HCC)    Caroleen HammanKristin Mertha Clyatt, NP-C 01/03/2018 8:46 AM Cell: [phone]

## 2018-01-03 NOTE — Plan of Care (Signed)
Patient A&O on 4L nasal cannula. Remains fatigued throughout the day,  states he cannot catch his breath to eat and is dyspneic at rest, cannot tolerate activity.  Ensures given to maintain nutrition. Oxygen saturations remain above 88%. Bed alarm on, hourly rounding completed on patient. Will continue to assess and monitor.   Safety: Ability to remain free from injury will improve 01/03/2018 1810 - Progressing by Jake BatheKirkendall, Donte Kary D, RN   Activity: Ability to tolerate increased activity will improve 01/03/2018 1810 - Not Progressing by Jake BatheKirkendall, Tremond Shimabukuro D, RN   Health Behavior/Discharge Planning: Ability to safely manage health-related needs after discharge will improve 01/03/2018 1810 - Not Progressing by Heath LarkKirkendall, Makina Skow D, RN

## 2018-01-04 ENCOUNTER — Ambulatory Visit: Payer: Medicare Other | Admitting: Family

## 2018-01-04 ENCOUNTER — Telehealth: Payer: Self-pay | Admitting: Internal Medicine

## 2018-01-04 LAB — GLUCOSE, CAPILLARY
GLUCOSE-CAPILLARY: 154 mg/dL — AB (ref 65–99)
GLUCOSE-CAPILLARY: 204 mg/dL — AB (ref 65–99)
GLUCOSE-CAPILLARY: 210 mg/dL — AB (ref 65–99)
Glucose-Capillary: 215 mg/dL — ABNORMAL HIGH (ref 65–99)

## 2018-01-04 LAB — BASIC METABOLIC PANEL
ANION GAP: 11 (ref 5–15)
BUN: 45 mg/dL — ABNORMAL HIGH (ref 6–20)
CHLORIDE: 99 mmol/L — AB (ref 101–111)
CO2: 26 mmol/L (ref 22–32)
Calcium: 9.9 mg/dL (ref 8.9–10.3)
Creatinine, Ser: 1.68 mg/dL — ABNORMAL HIGH (ref 0.61–1.24)
GFR calc non Af Amer: 38 mL/min — ABNORMAL LOW (ref >60)
GFR, EST AFRICAN AMERICAN: 44 mL/min — AB (ref >60)
Glucose, Bld: 171 mg/dL — ABNORMAL HIGH (ref 65–99)
POTASSIUM: 4.7 mmol/L (ref 3.5–5.1)
SODIUM: 136 mmol/L (ref 135–145)

## 2018-01-04 MED ORDER — ENSURE ENLIVE PO LIQD: 237.0000 mL | Freq: Two times a day (BID) | ORAL | 12 refills | Status: AC

## 2018-01-04 MED ORDER — FUROSEMIDE 40 MG PO TABS
40.0000 mg | ORAL_TABLET | Freq: Two times a day (BID) | ORAL | Status: DC
  Administered 2018-01-04 – 2018-01-07 (×7): 40 mg via ORAL
  Filled 2018-01-04 (×7): qty 1

## 2018-01-04 MED ORDER — DILTIAZEM HCL ER COATED BEADS 180 MG PO CP24: 180.0000 mg | ORAL_CAPSULE | Freq: Every day | ORAL | 0 refills | Status: AC

## 2018-01-04 MED ORDER — FUROSEMIDE 40 MG PO TABS: 40.0000 mg | ORAL_TABLET | Freq: Two times a day (BID) | ORAL | 1 refills | Status: AC

## 2018-01-04 NOTE — Progress Notes (Signed)
Pt have small skin tear close to his nephrostomy dressing. Doctor Allena Katzatel was notified. Awaiting callback.

## 2018-01-04 NOTE — Progress Notes (Signed)
Pt has not voided until 6pm. Bladder scan pt and its shows 73 ml of urine. Doctor Luberta MutterKonidena was called and ordered to re insert Foley catheter. Will continue to monitor.

## 2018-01-04 NOTE — Progress Notes (Signed)
Family Meeting Note  Advance Directive:no  Today a meeting took place with the wife and patient in the room  andMD and pt's son was on the phone   The following were discussed:Patient's diagnosis: , Patient's progosis: Mr. Sallyanne KusterSellers has significant medical comorbidities including severe emphysema and cardiomyopathy EF around 30% with chronic kidney disease and a nephrostomy tube which was placed during last admission.  CODE STATUS again and at this point wife would want patient to be full code and in the event patient ends up on being ventilator she does not want long-term ventilation if there is no improvement in his condition.   Wife does understand Mr. Sallyanne KusterSellers has poor prognosis  Patient will be a full code Additional follow-up to be provided: Palliative care consultation  Time spent during discussion:Roberto Romanoski Allena KatzPatel, MD  16 mins

## 2018-01-04 NOTE — Plan of Care (Signed)
  Not Progressing Clinical Measurements: Respiratory complications will improve 01/04/2018 0055 - Not Progressing by Kalman JewelsBallentine, Namira Rosekrans, RN Pain Managment: General experience of comfort will improve 01/04/2018 0055 - Not Progressing by Kalman JewelsBallentine, Alpheus Stiff, RN Health Behavior/Discharge Planning: Ability to safely manage health-related needs after discharge will improve 01/04/2018 0055 - Not Progressing by Kalman JewelsBallentine, Beula Joyner, RN  Dyspneic with exertion and even at rest. Unable to tolerate any exertion.

## 2018-01-04 NOTE — Telephone Encounter (Signed)
Noted  

## 2018-01-04 NOTE — Clinical Social Work Note (Addendum)
2:30pm  CSW spoke to patient's son Emmanuelle Recendez [phone] he said he would like to take patient to [facility] to get a second opinion.  Per physician she does not feel [facility] would do anything different.  CSW informed patient's son that if the attending physician is not recommending transfer to a different hospital that would be considered leaving AMA.  CSW informed patient's son that if they chose to go AMA, then CSW can not provide any assistance to patient and his family.  Patient's son stated they will discuss with patient and his wife and decide what they want to do.  CSW to continue to follow in case patient and his family change their mind.  5:15pm  CSW met with patient, his wife, and the son Demarion who was at bedside, and reiterated that if they decide to go AMA, they will not be able to get assistance with trying to get patient to [facility] hospital.  CSW explained that if they decide to go back to the SNF, then they can work with the SNF to try to get an appointment to see the specialist of their choice.    CSW informed them if they decide they do not want to return to [facility], there are other options for different SNFs.  CSW presented the bed offers and they have decided they would like to return back to [facility].  CSW contacted [facility], and per the director of nursing and administrator said they can not accept patient back until Monday, and are concerned that patient is not ready for discharge despite what the physician thinks.  CSW updated physician with SNFs concerns, and she is requesting to speak with administrator and DON herself.  CSW called [facility] and was informed that the DON is Cindy Gordon, and the Administrator is Michelle Rearden.    Patient's son Dreshaun Bojanowski [phone] is requesting an update from the physician, patient and his wife gave verbal consent to CSW to have physician speak with patient's son Donal.  CSW updated physician.   Patient and his family  have decided to not go AMA now and to return back to [facility] SNF, CSW to continue to facilitate discharge planning    Eric R. Anterhaus, MSW, LCSWA [phone]  01/04/2018 2:44 PM  

## 2018-01-04 NOTE — Telephone Encounter (Signed)
fyi -   Cancelled patient appt monday.  Per Grandson patient admitted to hospital

## 2018-01-04 NOTE — Progress Notes (Signed)
SUBJECTIVE: Patient appears to be comfortable   Vitals:   01/03/18 1955 01/04/18 0350 01/04/18 0743 01/04/18 0841  BP: 124/88 124/88 (!) 144/81   Pulse: 82 88 75 70  Resp: 18 18 18    Temp: 98 F (36.7 C) 98.3 F (36.8 C) 97.9 F (36.6 C)   TempSrc: Oral Oral    SpO2: 91% 94% 96% 95%  Weight:  195 lb 8 oz (88.7 kg)    Height:        Intake/Output Summary (Last 24 hours) at 01/04/2018 0857 Last data filed at 01/04/2018 16100812 Gross per 24 hour  Intake 480 ml  Output 2875 ml  Net -2395 ml    LABS: Basic Metabolic Panel: Recent Labs    01/02/18 0417 01/04/18 0611  NA 133* 136  K 5.2* 4.7  CL 97* 99*  CO2 26 26  GLUCOSE 242* 171*  BUN 51* 45*  CREATININE 1.68* 1.68*  CALCIUM 9.6 9.9   Liver Function Tests: No results for input(s): AST, ALT, ALKPHOS, BILITOT, PROT, ALBUMIN in the last 72 hours. No results for input(s): LIPASE, AMYLASE in the last 72 hours. CBC: Recent Labs    01/02/18 0417  WBC 8.8  HGB 14.8  HCT 47.2  MCV 93.4  PLT 151   Cardiac Enzymes: No results for input(s): CKTOTAL, CKMB, CKMBINDEX, TROPONINI in the last 72 hours. BNP: Invalid input(s): POCBNP D-Dimer: No results for input(s): DDIMER in the last 72 hours. Hemoglobin A1C: No results for input(s): HGBA1C in the last 72 hours. Fasting Lipid Panel: No results for input(s): CHOL, HDL, LDLCALC, TRIG, CHOLHDL, LDLDIRECT in the last 72 hours. Thyroid Function Tests: No results for input(s): TSH, T4TOTAL, T3FREE, THYROIDAB in the last 72 hours.  Invalid input(s): FREET3 Anemia Panel: No results for input(s): VITAMINB12, FOLATE, FERRITIN, TIBC, IRON, RETICCTPCT in the last 72 hours.   PHYSICAL EXAM General: Well developed, well nourished, in no acute distress HEENT:  Normocephalic and atramatic Neck:  No JVD.  Lungs: Clear bilaterally to auscultation and percussion. Heart: HRRR . Normal S1 and S2 without gallops or murmurs.  Abdomen: Bowel sounds are positive, abdomen soft and  non-tender  Msk:  Back normal, normal gait. Normal strength and tone for age. Extremities: No clubbing, cyanosis or edema.   Neuro: Alert and oriented X 3. Psych:  Good affect, responds appropriately  TELEMETRY: [name]. fib with controlled ventricular rate  ASSESSMENT AND PLAN: Acute respiratory failure with left ventricle ejection fraction normal and severe pulmonary hypertension continue did.digoxin and diltiazem and Lasix.  Principal Problem:   Acute respiratory failure with hypoxia (HCC) Active Problems:   Acute on chronic diastolic CHF (congestive heart failure) (HCC)   Atrial fibrillation with RVR (HCC)    Austin [name],Austin Gutt A, MD, [facility] 01/04/2018 8:57 AM

## 2018-01-04 NOTE — Progress Notes (Signed)
[facility] - [place] [facility]   PATIENT NAME: Austin [name]    MR#:  [id number]  DATE OF BIRTH:  [birth year]  SUBJECTIVE:   Patient now on nasal cannula oxygen sats are more than 90% on room air.    Denies any complaints. shortness of breath with minimal activity HR much improved Wife in the room REVIEW OF SYSTEMS:   Review of Systems  Constitutional: Negative for chills, fever and weight loss.  HENT: Negative for ear discharge, ear pain and nosebleeds.   Eyes: Negative for blurred vision, pain and discharge.  Respiratory: Positive for shortness of breath. Negative for sputum production, wheezing and stridor.   Cardiovascular: Negative for chest pain, palpitations, orthopnea and PND.  Gastrointestinal: Negative for abdominal pain, diarrhea, nausea and vomiting.  Genitourinary: Negative for frequency and urgency.  Musculoskeletal: Negative for back pain and joint pain.  Neurological: Positive for weakness. Negative for sensory change, speech change and focal weakness.  Psychiatric/Behavioral: Negative for depression and hallucinations. The patient is not nervous/anxious.    Tolerating Diet:yes Tolerating PT: SNF  DRUG ALLERGIES:   Allergies  Allergen Reactions  . Contrast Media [Iodinated Diagnostic Agents] Nausea And Vomiting  . Other Other (See Comments)    Cannot have "high doses of anesthesia because of his lungs"    VITALS:  Blood pressure (!) 144/81, pulse 70, temperature 97.9 F (36.6 C), resp. rate 18, height 6' (1.829 m), weight 88.7 kg (195 lb 8 oz), SpO2 95 %.  PHYSICAL EXAMINATION:   Physical Exam  GENERAL:  77 y.o.-year-old patient lying in the bed with no acute distress.  Appears chronically ill EYES: Pupils equal, round, reactive to light and accommodation. No scleral icterus. Extraocular muscles intact.  HEENT: Head atraumatic, normocephalic. Oropharynx and nasopharynx clear. poor Dentition NECK:  Supple, no jugular venous  distention. No thyroid enlargement, no tenderness.  LUNGS:decreased breath sounds bilaterally, no wheezing, rales, rhonchi. No use of accessory muscles of respiration.  CARDIOVASCULAR: S1, S2 normal. No murmurs, rubs, or gallops. Irregular rhythm  ABDOMEN: Soft, nontender, nondistended. Bowel sounds present. No organomegaly or mass. left-sided nephrostomy tube +, Foley ++ EXTREMITIES: No cyanosis, clubbing,+edema b/l.    NEUROLOGIC: Cranial nerves II through XII are intact. No focal Motor or sensory deficits b/l.   PSYCHIATRIC:  patient is alert and oriented x 2 SKIN: No obvious rash, lesion, or ulcer.   LABORATORY PANEL:  CBC Recent Labs  Lab 01/02/18 0417  WBC 8.8  HGB 14.8  HCT 47.2  PLT 151    Chemistries  Recent Labs  Lab 01/01/18 0534  01/04/18 0611  NA 137   < > 136  K 5.0   < > 4.7  CL 101   < > 99*  CO2 26   < > 26  GLUCOSE 143*   < > 171*  BUN 53*   < > 45*  CREATININE 1.66*   < > 1.68*  CALCIUM 9.7   < > 9.9  MG 2.3  --   --    < > = values in this interval not displayed.   Cardiac Enzymes Recent Labs  Lab 12/31/17 0438  TROPONINI 0.20*   RADIOLOGY:  No results found. ASSESSMENT AND PLAN:  Austin [name]  is a 77 y.o. male with a known history of CHF- EF 50% in recent echo, COPD, Ch A fib, Htn, HLD, PE, recent admissions twice- first for CHF and then for renal failure due to Ureteral stone- s/p nephrostomy tube and d/c  yesterday to rehab.  PT Was very SOB, so sent  to ER today. At baseline pt is on 3 ltr oxygen. Required Bipap in ER due to CHF, have A fib with RVR and on cardizem drip  1.  Acute chronic hypoxic respiratory failure secondary to congestive heart failure acute on chronic diastolic and severe emphysema -Currently is off BiPAP - IV Lasix 40 twice daily, monitor I's and O's, monitor creatinine--change to oral Lasix -Echo showed EF of 50% -good Uop -Patient has very poor lung reserve given his severe emphysema.  He gets short winded and sats  dropped easily on minimal activity.  This was discussed with patient's wife in the room along with son on the phone.  They would like patient to be full code for now. -Asked about palliative care to discuss goals of care and wife agreeable.  Consultation placed.  2.  A. fib with RVR -Rate in the 90s.  Patient is on Cardizem and digoxin -Cardiology consultation noted -Defer anticoagulation  Due to hemtauria  3.  Acute on chronic renal failure CKD stage III -Baseline creatinine 1.3 -Came in with creatinine 1.9--- 1.7-- 1.66  4.  Obstructive uropathy with left ureteral stone and status post left nephrostomy tube placement -We will follow with urology as outpatient--patient has appointment with Dr. Lorriane Shire -pt developed urinary retention and had some issues with blood clots in the foley --irrigated now draining well -consider d/c foley today.  Patient has a very high risk of infection given nephrostomy tube also.  If need be will place condom catheter.  If patient has symptoms of urinary retention then we will place a Foley catheter in and discharge to rehab tomorrow and have him follow-up with Dr. Lorriane Shire as outpatient  5.  Diabetes -Sliding scale insulin -Continue low-dose Lantus  6.  DVT prophylaxis subcu Lovenox  7.  Discharge planning to rehab in am    Case discussed with Care Management/Social Worker. Management plans discussed with the patient, family and they are in agreement.  CODE STATUS: Full  DVT Prophylaxis: Lovenox  TOTAL TIME TAKING CARE OF THIS PATIENT: 30 minutes.  >50% time spent on counselling and coordination of care with patient and wife  POSSIBLE D/C IN 1 DAYS, DEPENDING ON CLINICAL CONDITION.  Note: This dictation was prepared with Dragon dictation along with smaller phrase technology. Any transcriptional errors that result from this process are unintentional.  Enedina Finner M.D on 01/04/2018 at 11:50 AM  Between 7am to 6pm - Pager - [phone]  After 6pm  go to [email or web address] - Social research officer, government  [facility]  Office  [phone]  CC: Primary care physician; [facility]Patient ID: Benito Mccreedy, male   DOB: 1941-12-04, 77 y.o.   MRN: [id number]

## 2018-01-04 NOTE — Progress Notes (Signed)
Patient ID: Austin [name], male   DOB: [birth year], 77 y.o.   MRN: [id number] Called By RN stating patient's son and wife would like second opinion and [facility] I called in the room spoke with patient's wife at length.  Explained wife again patient has multiple comorbidities severe COPD, cardiomyopathy with systolic congestive heart failure, nephrolithiasis ureterolithiasis requiring nephrostomy tube and he is currently best at his baseline.  Explained to the wife that [facility] will weakly not add anything more than what is being done here.  Wife would like to wait for son to come by and discussed with him.  Explained to the wife that patient is at his best baseline and overall medically improved and stable for discharge to rehab.  Charge nurse serenity aware of above.  Social worker aware as well.

## 2018-01-04 NOTE — Progress Notes (Signed)
Foley was removed at 12:15pm. Pt tolerated well. Will continue to monitor. Size ft 16. Bardex. Will continue to monitor.

## 2018-01-04 NOTE — Plan of Care (Signed)
  Progressing Clinical Measurements: Respiratory complications will improve 01/04/2018 1124 - Progressing by Myles GipKimrey, Orlie Cundari M, RN Pain Managment: General experience of comfort will improve 01/04/2018 1124 - Progressing by Myles GipKimrey, Mahad Newstrom M, RN Safety: Ability to remain free from injury will improve 01/04/2018 1124 - Progressing by Myles GipKimrey, Vint Pola M, RN Cardiac: Ability to achieve and maintain adequate cardiopulmonary perfusion will improve 01/04/2018 1124 - Progressing by Myles GipKimrey, Andee Chivers M, RN

## 2018-01-04 NOTE — Progress Notes (Signed)
Wife wants to get a second opinion for the pt. She also wants to discontinue the palliative care consult at this time. Doctor Allena Katzatel was notified. Will continue to monitor.

## 2018-01-05 ENCOUNTER — Inpatient Hospital Stay: Payer: Medicare Other

## 2018-01-05 LAB — GLUCOSE, CAPILLARY
Glucose-Capillary: 166 mg/dL — ABNORMAL HIGH (ref 65–99)
Glucose-Capillary: 202 mg/dL — ABNORMAL HIGH (ref 65–99)

## 2018-01-05 MED ORDER — ACETAMINOPHEN 325 MG PO TABS
650.0000 mg | ORAL_TABLET | Freq: Four times a day (QID) | ORAL | Status: DC | PRN
  Administered 2018-01-05: 650 mg via ORAL
  Filled 2018-01-05: qty 2

## 2018-01-05 NOTE — Progress Notes (Signed)
Patient ID: Austin MccreedyJames Allen Waguespack, male   DOB: 05-30-1941, 77 y.o.   MRN: [id number] Spoke with wife again and she mentioned that [facility] he pt there. D/w wife I will try to call [facility] and d/w Internal medicine service to see if they can accept him. I told her they may decline pt transfer. She voiced understanding and said "OK, anyways my son is going to take him and you can try aslo" Charge nurse aware. Will inform nursing superviser

## 2018-01-05 NOTE — Progress Notes (Signed)
Pt's family has signed ama form and plan to take him in their car to [facility]. Foley d/c'd iv removed also tele. When attempts made to get pt in w.c. (son did lifting)., pt became dyspneic anxioud and 02 sats dropped to 70's. resp therapy summoned. Pt helped back to bed and c pap applied with good effect. Dr. Allena Katzpatel notified of these events.

## 2018-01-05 NOTE — Progress Notes (Signed)
SUBJECTIVE: Patient denies any chest pain or shortness of breath   Vitals:   01/04/18 1943 01/04/18 2025 01/04/18 2200 01/05/18 0310  BP:  106/76  115/83  Pulse:  88 98 (!) 106  Resp:  18  18  Temp:  97.8 F (36.6 C)  97.7 F (36.5 C)  TempSrc:  Oral  Oral  SpO2: 92% 91%  94%  Weight:    189 lb (85.7 kg)  Height:        Intake/Output Summary (Last 24 hours) at 01/05/2018 1002 Last data filed at 01/05/2018 0310 Gross per 24 hour  Intake 125 ml  Output 2025 ml  Net -1900 ml    LABS: Basic Metabolic Panel: Recent Labs    01/04/18 0611  NA 136  K 4.7  CL 99*  CO2 26  GLUCOSE 171*  BUN 45*  CREATININE 1.68*  CALCIUM 9.9   Liver Function Tests: No results for input(s): AST, ALT, ALKPHOS, BILITOT, PROT, ALBUMIN in the last 72 hours. No results for input(s): LIPASE, AMYLASE in the last 72 hours. CBC: No results for input(s): WBC, NEUTROABS, HGB, HCT, MCV, PLT in the last 72 hours. Cardiac Enzymes: No results for input(s): CKTOTAL, CKMB, CKMBINDEX, TROPONINI in the last 72 hours. BNP: Invalid input(s): POCBNP D-Dimer: No results for input(s): DDIMER in the last 72 hours. Hemoglobin A1C: No results for input(s): HGBA1C in the last 72 hours. Fasting Lipid Panel: No results for input(s): CHOL, HDL, LDLCALC, TRIG, CHOLHDL, LDLDIRECT in the last 72 hours. Thyroid Function Tests: No results for input(s): TSH, T4TOTAL, T3FREE, THYROIDAB in the last 72 hours.  Invalid input(s): FREET3 Anemia Panel: No results for input(s): VITAMINB12, FOLATE, FERRITIN, TIBC, IRON, RETICCTPCT in the last 72 hours.   PHYSICAL EXAM General: Well developed, well nourished, in no acute distress HEENT:  Normocephalic and atramatic Neck:  No JVD.  Lungs: Clear bilaterally to auscultation and percussion. Heart: HRRR . Normal S1 and S2 without gallops or murmurs.  Abdomen: Bowel sounds are positive, abdomen soft and non-tender  Msk:  Back normal, normal gait. Normal strength and tone for  age. Extremities: No clubbing, cyanosis or edema.   Neuro: Alert and oriented X 3. Psych:  Good affect, responds appropriately  TELEMETRY: Atrial fibrillation with controlled ventricular rate  ASSESSMENT AND PLAN: Acute respiratory failure with left ventricle ejection fraction normal and severe pulmonary hypertension continue did.digoxin and diltiazem and Lasix.    Principal Problem:   Acute respiratory failure with hypoxia (HCC) Active Problems:   Acute on chronic diastolic CHF (congestive heart failure) (HCC)   Atrial fibrillation with RVR (HCC)    Derl Abalos A, MD, [facility] 01/05/2018 10:02 AM

## 2018-01-05 NOTE — Progress Notes (Signed)
[facility] [place] [facility]   PATIENT NAME: Austin [name]    MR#:  [id number]  DATE OF BIRTH:  1941-02-15  SUBJECTIVE:   Patient now on nasal cannula oxygen sats are more than 94% on 4 liter [place].   shortness of breath with minimal activity HR much improved Wife in the room, ate breakfast this am REVIEW OF SYSTEMS:   Review of Systems  Constitutional: Negative for chills, fever and weight loss.  HENT: Negative for ear discharge, ear pain and nosebleeds.   Eyes: Negative for blurred vision, pain and discharge.  Respiratory: Positive for shortness of breath. Negative for sputum production, wheezing and stridor.   Cardiovascular: Negative for chest pain, palpitations, orthopnea and PND.  Gastrointestinal: Negative for abdominal pain, diarrhea, nausea and vomiting.  Genitourinary: Negative for frequency and urgency.  Musculoskeletal: Negative for back pain and joint pain.  Neurological: Positive for weakness. Negative for sensory change, speech change and focal weakness.  Psychiatric/Behavioral: Negative for depression and hallucinations. The patient is not nervous/anxious.    Tolerating Diet:yes Tolerating PT: SNF  DRUG ALLERGIES:   Allergies  Allergen Reactions  . Contrast Media [Iodinated Diagnostic Agents] Nausea And Vomiting  . Other Other (See Comments)    Cannot have "high doses of anesthesia because of his lungs"    VITALS:  Blood pressure 115/83, pulse (!) 106, temperature 97.7 F (36.5 C), temperature source Oral, resp. rate 18, height 6' (1.829 m), weight 85.7 kg (189 lb), SpO2 94 %.  PHYSICAL EXAMINATION:   Physical Exam  GENERAL:  77 y.o.-year-old patient lying in the bed with no acute distress.  Appears chronically ill EYES: Pupils equal, round, reactive to light and accommodation. No scleral icterus. Extraocular muscles intact.  HEENT: Head atraumatic, normocephalic. Oropharynx and nasopharynx clear. poor Dentition NECK:   Supple, no jugular venous distention. No thyroid enlargement, no tenderness.  LUNGS:decreased breath sounds bilaterally, no wheezing, rales, rhonchi. No use of accessory muscles of respiration.  CARDIOVASCULAR: S1, S2 normal. No murmurs, rubs, or gallops. Irregular rhythm  ABDOMEN: Soft, nontender, nondistended. Bowel sounds present. No organomegaly or mass. left-sided nephrostomy tube +, Foley ++ EXTREMITIES: No cyanosis, clubbing,+edema b/l.    NEUROLOGIC: Cranial nerves II through XII are intact. No focal Motor or sensory deficits b/l.  Overall weak PSYCHIATRIC:  patient is alert and oriented x 2 SKIN: No obvious rash, lesion, or ulcer.   LABORATORY PANEL:  CBC Recent Labs  Lab 01/02/18 0417  WBC 8.8  HGB 14.8  HCT 47.2  PLT 151    Chemistries  Recent Labs  Lab 01/01/18 0534  01/04/18 0611  NA 137   < > 136  K 5.0   < > 4.7  CL 101   < > 99*  CO2 26   < > 26  GLUCOSE 143*   < > 171*  BUN 53*   < > 45*  CREATININE 1.66*   < > 1.68*  CALCIUM 9.7   < > 9.9  MG 2.3  --   --    < > = values in this interval not displayed.   Cardiac Enzymes Recent Labs  Lab 12/31/17 0438  TROPONINI 0.20*   RADIOLOGY:  No results found. ASSESSMENT AND PLAN:  Austin [name]  is a 77 y.o. male with a known history of CHF- EF 50% in recent echo, COPD, Ch A fib, Htn, HLD, PE, recent admissions - first for CHF and then for renal failure due to Ureteral stone- s/p nephrostomy  tube and d/c to rehab.  PT Was very SOB, so sent  to ER today. At baseline pt is on 3 ltr oxygen. Required Bipap in ER due to CHF, have A fib with RVR and on cardizem drip  1.  Acute chronic hypoxic respiratory failure secondary to congestive heart failure acute on chronic diastolic and severe emphysema -Currently is off BiPAP - IV Lasix 40 twice daily, monitor I's and O's, monitor creatinine--changed to oral Lasix -Echo showed EF of 50% -good Uop -Patient has very poor lung reserve given his severe emphysema. His stas  are stable at 4liter [place] oxygen. sats drop down with activity but recover.  This was discussed with patient's wife in the room along with son on the phone.   -Asked about palliative care to discuss goals of care and wife was initially agreeable but then declined  2. A. fib with RVR with pulmonary HTN -Rate in the 90s.  Patient is on Cardizem and digoxin -Cardiology consultation with Dr Grafton FolkKhan--recommends continue current meds -Defer anticoagulation due to hemtauria  3.  Acute on chronic renal failure CKD stage III -Baseline creatinine 1.3 -Came in with creatinine 1.9--- 1.7-- 1.66--1.68  4.  Obstructive uropathy with left ureteral stone and status post left nephrostomy tube placement -pt will follow with urology up as outpatient--patient has appointment with Dr. Apolinar JunesBrandon -pt developed urinary retention and had some issues with blood clots in the foley --irrigated now draining well -attempted to d/c foley yday however had to be placed due to retention. Pt's wife reports son is wanting a second opinion [facility]. -Wife would like foley to be removed.  5.  Diabetes -Sliding scale insulin -Continue low-dose Lantus  6.  DVT prophylaxis subcu Lovenox  Wife reports that son is going to transport pt in his car with his oxygen today to go to [facility].  RN informed.   Case discussed with Care Management/Social Worker. Management plans discussed with the patient, family and they are in agreement.  CODE STATUS: Full  DVT Prophylaxis: scd   TOTAL TIME TAKING CARE OF THIS PATIENT: 30 minutes.  >50% time spent on counselling and coordination of care with patient and wife   Note: This dictation was prepared with Dragon dictation along with smaller phrase technology. Any transcriptional errors that result from this process are unintentional.  Enedina FinnerSona Baili Stang M.D on 01/05/2018 at 11:16 AM  Between 7am to 6pm - Pager - [phone]  After 6pm go to [email or web address] - Social research officer, governmentpassword EPAS [facility]  [facility] [place]  [facility]  Office  [phone]  CC: Primary care physician; [facility] ID: Austin [name], male   DOB: Oct 11, 1941, 77 y.o.   MRN: [id number]

## 2018-01-05 NOTE — Progress Notes (Signed)
Patient ID: Austin [name], male   DOB: 04-Aug-1941, 77 y.o.   MRN: [id number]  Spoke with internal medicine hospitalist Dr. Alphonsus SiasVanam.  Case discussed with him.  He does not have anything else to offer from what [facility].  [facility] transfer has been declined.  I have informed the charge nurse to speak with patient's wife and let her know.

## 2018-01-05 NOTE — Progress Notes (Signed)
Patient ID: Austin [name], male   DOB: 26-Jul-1941, 77 y.o.   MRN: [id number] [facility] and still waiting fro Dr Alphonsus SiasVanam to call me back

## 2018-01-05 NOTE — Progress Notes (Signed)
Patient ID: Austin MccreedyJames Allen Karney, male   DOB: 06/04/1941, 77 y.o.   MRN: [id number]  I called [facility] transfer center and now waiting for hospitalist to call me back

## 2018-01-05 NOTE — Plan of Care (Signed)
  Not Progressing Clinical Measurements: Respiratory complications will improve 01/05/2018 [id number] - Not Progressing by Stefan Churchogers, Sydna Brodowski M, RN

## 2018-01-06 LAB — GLUCOSE, CAPILLARY
GLUCOSE-CAPILLARY: 161 mg/dL — AB (ref 65–99)
Glucose-Capillary: 131 mg/dL — ABNORMAL HIGH (ref 65–99)
Glucose-Capillary: 218 mg/dL — ABNORMAL HIGH (ref 65–99)
Glucose-Capillary: 244 mg/dL — ABNORMAL HIGH (ref 65–99)

## 2018-01-06 NOTE — Progress Notes (Signed)
[facility] -  at [facility]   PATIENT NAME: Austin [name]    MR#:  [id number]  DATE OF BIRTH:  31-Jan-1941  SUBJECTIVE:   Patient now on nasal cannula oxygen sats are more than 94% on 4 liter [place].   shortness of breath with minimal activity HR much improved  REVIEW OF SYSTEMS:   Review of Systems  Constitutional: Negative for chills, fever and weight loss.  HENT: Negative for ear discharge, ear pain and nosebleeds.   Eyes: Negative for blurred vision, pain and discharge.  Respiratory: Positive for shortness of breath. Negative for sputum production, wheezing and stridor.   Cardiovascular: Negative for chest pain, palpitations, orthopnea and PND.  Gastrointestinal: Negative for abdominal pain, diarrhea, nausea and vomiting.  Genitourinary: Negative for frequency and urgency.  Musculoskeletal: Negative for back pain and joint pain.  Neurological: Positive for weakness. Negative for sensory change, speech change and focal weakness.  Psychiatric/Behavioral: Negative for depression and hallucinations. The patient is not nervous/anxious.    Tolerating Diet:yes Tolerating PT: SNF  DRUG ALLERGIES:   Allergies  Allergen Reactions  . Contrast Media [Iodinated Diagnostic Agents] Nausea And Vomiting  . Other Other (See Comments)    Cannot have "high doses of anesthesia because of his lungs"    VITALS:  Blood pressure 120/84, pulse 79, temperature (!) 97.5 F (36.4 C), temperature source Axillary, resp. rate (!) 22, height 6' (1.829 m), weight 86.9 kg (191 lb 8 oz), SpO2 95 %.  PHYSICAL EXAMINATION:   Physical Exam  GENERAL:  77 y.o.-year-old patient lying in the bed with no acute distress.  Appears chronically ill EYES: Pupils equal, round, reactive to light and accommodation. No scleral icterus. Extraocular muscles intact.  HEENT: Head atraumatic, normocephalic. Oropharynx and nasopharynx clear. poor Dentition NECK:  Supple, no jugular venous  distention. No thyroid enlargement, no tenderness.  LUNGS:decreased breath sounds bilaterally, no wheezing, rales, rhonchi. No use of accessory muscles of respiration.  CARDIOVASCULAR: S1, S2 normal. No murmurs, rubs, or gallops. Irregular rhythm  ABDOMEN: Soft, nontender, nondistended. Bowel sounds present. No organomegaly or mass. left-sided nephrostomy tube +, Foley ++ EXTREMITIES: No cyanosis, clubbing,+edema b/l.    NEUROLOGIC: Cranial nerves II through XII are intact. No focal Motor or sensory deficits b/l.  Overall weak PSYCHIATRIC:  patient is alert and oriented x 2 SKIN: No obvious rash, lesion, or ulcer.   LABORATORY PANEL:  CBC Recent Labs  Lab 01/02/18 0417  WBC 8.8  HGB 14.8  HCT 47.2  PLT 151    Chemistries  Recent Labs  Lab 01/01/18 0534  01/04/18 0611  NA 137   < > 136  K 5.0   < > 4.7  CL 101   < > 99*  CO2 26   < > 26  GLUCOSE 143*   < > 171*  BUN 53*   < > 45*  CREATININE 1.66*   < > 1.68*  CALCIUM 9.7   < > 9.9  MG 2.3  --   --    < > = values in this interval not displayed.   Cardiac Enzymes Recent Labs  Lab 12/31/17 0438  TROPONINI 0.20*   RADIOLOGY:  Dg Abd 2 Views  Result Date: 01/05/2018 CLINICAL DATA:  Abdominal pain and distention.  Sepsis. EXAM: ABDOMEN - 2 VIEW COMPARISON:  12/30/2017 FINDINGS: Diffuse colonic dilatation shows no significant change. Large amount of stool is again seen. Left percutaneous nephrostomy tube remains in place. IMPRESSION: No significant change in diffuse colonic  dilatation and large stool burden. Electronically Signed   By: Myles RosenthalJohn  Stahl M.D.   On: 01/05/2018 21:44   ASSESSMENT AND PLAN:  Austin MessingJames [name]  is a 77 y.o. male with a known history of CHF- EF 50% in recent echo, COPD, Ch A fib, Htn, HLD, PE, recent admissions - first for CHF and then for renal failure due to Ureteral stone- s/p nephrostomy tube and d/c to rehab.  PT Was very SOB, so sent  to ER today. At baseline pt is on 3 ltr oxygen. Required Bipap in  ER due to CHF, have A fib with RVR and on cardizem drip  1.  Acute chronic hypoxic respiratory failure secondary to congestive heart failure acute on chronic diastolic and severe emphysema -Currently is off BiPAP - IV Lasix 40 twice daily, monitor I's and O's, monitor creatinine--changed to oral Lasix -Echo showed EF of 50% -good Uop -Patient has very poor lung reserve given his severe emphysema. His stas are stable at 4liter [place] oxygen. sats drop down with activity but recover.   -Asked about palliative care to discuss goals of care and wife was initially agreeable but then declined Will discuss with case management if CPAP/noninvasive ventilator can be arranged at the rehab place-  2. A. fib with RVR with pulmonary HTN -Rate in the 90s.  Patient is on Cardizem and digoxin -Cardiology consultation with Dr Grafton FolkKhan--recommends continue current meds -Defer anticoagulation due to hemtauria  3.  Acute on chronic renal failure CKD stage III -Baseline creatinine 1.3 -Came in with creatinine 1.9--- 1.7-- 1.66--1.68  4.  Obstructive uropathy with left ureteral stone and status post left nephrostomy tube placement -pt will follow with urology up as outpatient--patient has appointment with Dr. Apolinar JunesBrandon  5.  Diabetes -Sliding scale insulin -Continue low-dose Lantus  6.  DVT prophylaxis subcu Lovenox     Case discussed with Care Management/Social Worker. Management plans discussed with the patient, family and they are in agreement.  CODE STATUS: Full  DVT Prophylaxis: scd   TOTAL TIME TAKING CARE OF THIS PATIENT: 30 minutes.  >50% time spent on counselling and coordination of care with patient and wife   Note: This dictation was prepared with Dragon dictation along with smaller phrase technology. Any transcriptional errors that result from this process are unintentional.  Enedina FinnerSona Ignazio Kincaid M.D on 01/06/2018 at 12:53 PM  Between 7am to 6pm - Pager - [phone]  After 6pm go to [email or web address] -  Social research officer, governmentpassword EPAS [facility]  [facility] [place] [facility]  Office  [phone]  CC: Primary care physician; [facility] IncPatient ID: Benito MccreedyJames Allen Ferdig, male   DOB: 1941-04-22, 77 y.o.   MRN: [id number]

## 2018-01-07 ENCOUNTER — Inpatient Hospital Stay: Payer: Medicare Other | Admitting: Internal Medicine

## 2018-01-07 LAB — GLUCOSE, CAPILLARY
GLUCOSE-CAPILLARY: 123 mg/dL — AB (ref 65–99)
GLUCOSE-CAPILLARY: 187 mg/dL — AB (ref 65–99)

## 2018-01-07 MED ORDER — ASCORBIC ACID 250 MG PO TABS: 250.0000 mg | ORAL_TABLET | Freq: Two times a day (BID) | ORAL | 1 refills | Status: AC

## 2018-01-07 MED ORDER — VITAMIN C 500 MG PO TABS
250.0000 mg | ORAL_TABLET | Freq: Two times a day (BID) | ORAL | Status: DC
  Filled 2018-01-07 (×2): qty 0.5

## 2018-01-07 NOTE — Progress Notes (Signed)
Nutrition Follow Up Note   DOCUMENTATION CODES:   Not applicable  INTERVENTION:   Provide Ensure Enlive po BID, each supplement provides 350 kcal and 20 grams of protein.  Provide Magic cup TID with meals, each supplement provides 290 kcal and 9 grams of protein.  Provide MVI daily.  Liberalize diet- no salt packs on trays   Vitamin C 250mg  po BID  NUTRITION DIAGNOSIS:   Inadequate oral intake related to decreased appetite, constipation as evidenced by per patient/family report.  GOAL:   Patient will meet greater than or equal to 90% of their needs  -not meeting   MONITOR:   PO intake, Supplement acceptance, Labs, Weight trends, Skin, I & O's  ASSESSMENT:   77 year old male with PMHx of COPD, hx gastric ulcer, hx DVT and PE, HTN, hypercholesteremia, DM type 2, GERD, A-fib, asthma, CHF with two recent admissions (first for CHF and second for renal failure due to ureteral stone s/p nephrostomy tube placement) who now presents from rehab with acute hypoxic respiratory failure, acute CHF exacerbation, A-fib with RVR.   Pt continues to have poor appetite and oral intake eating <50% of meals. Pt is drinking some Ensure. RD will liberalize pt's diet and request that no salt packs be placed on patient trays. Per chart, pt with 4lbs weight loss since admit. Pt may need a feeding tube and nutrition support to meet his estimated needs. Pt with poor prognosis per MD; nutrition support may not be in line with pt's goals of care. Family declining palliative consult at this time. Recommend vitamin C supplementation to encourage wound healing. RD will monitor for goals of care.     Medications reviewed and include: aspirin, lasix, insulin, nicotine, protonix  Labs reviewed: no new labs  Diet Order:  Diet 2 gram sodium Room service appropriate? Yes with Assist; Fluid consistency: Thin  EDUCATION NEEDS:   No education needs have been identified at this time  Skin:  Other: non pressure  wounds left leg; ecchymosis bilateral arms; skin tear mid sacrum  Last BM:  1/27- type 6  Height:   Ht Readings from Last 1 Encounters:  12/30/17 6' (1.829 m)    Weight:   Wt Readings from Last 1 Encounters:  01/07/18 194 lb 8 oz (88.2 kg)    Ideal Body Weight:  80.9 kg  BMI:  Body mass index is 26.38 kg/m.  Estimated Nutritional Needs:   Kcal:  2025-2190 (MSJ x 1.2-1.3)  Protein:  90-110 grams (1-1.2 grams/kg)  Fluid:  2 L/day (25 mL/kg IBW)  Betsey Holidayasey Enzio Buchler MS, RD, LDN Pager #- [phone] After Hours Pager: [phone]

## 2018-01-07 NOTE — Progress Notes (Signed)
SUBJECTIVE: Pt continues to have shortness of breath.    Vitals:   01/07/18 0400 01/07/18 0822 01/07/18 0846 01/07/18 1201  BP: 113/86  124/82   Pulse: 76  74 85  Resp: 18  18   Temp: 98 F (36.7 C)     TempSrc:      SpO2: 96% 90% 90% 90%  Weight: 194 lb 8 oz (88.2 kg)     Height:        Intake/Output Summary (Last 24 hours) at 01/07/2018 1249 Last data filed at 01/07/2018 0956 Gross per 24 hour  Intake 1080 ml  Output 850 ml  Net 230 ml    LABS: Basic Metabolic Panel: No results for input(s): NA, K, CL, CO2, GLUCOSE, BUN, CREATININE, CALCIUM, MG, PHOS in the last 72 hours. Liver Function Tests: No results for input(s): AST, ALT, ALKPHOS, BILITOT, PROT, ALBUMIN in the last 72 hours. No results for input(s): LIPASE, AMYLASE in the last 72 hours. CBC: No results for input(s): WBC, NEUTROABS, HGB, HCT, MCV, PLT in the last 72 hours. Cardiac Enzymes: No results for input(s): CKTOTAL, CKMB, CKMBINDEX, TROPONINI in the last 72 hours. BNP: Invalid input(s): POCBNP D-Dimer: No results for input(s): DDIMER in the last 72 hours. Hemoglobin A1C: No results for input(s): HGBA1C in the last 72 hours. Fasting Lipid Panel: No results for input(s): CHOL, HDL, LDLCALC, TRIG, CHOLHDL, LDLDIRECT in the last 72 hours. Thyroid Function Tests: No results for input(s): TSH, T4TOTAL, T3FREE, THYROIDAB in the last 72 hours.  Invalid input(s): FREET3 Anemia Panel: No results for input(s): VITAMINB12, FOLATE, FERRITIN, TIBC, IRON, RETICCTPCT in the last 72 hours.   PHYSICAL EXAM General: Frail. Increased work of breathing noted. HEENT:  Normocephalic and atramatic Neck:  No JVD.  Lungs: Clear bilaterally to auscultation and percussion. Heart: 2/6 aortic murmur.  Abdomen: Bowel sounds are positive, abdomen soft and non-tender  Msk:  Back normal, normal gait. Normal strength and tone for age. Extremities: No clubbing, cyanosis or edema.   Neuro: Alert and oriented X 3. Psych:  Good  affect, responds appropriately  TELEMETRY: Not available.  ASSESSMENT AND PLAN: Acute respiratory failure with normal LVEF and severe pulmonary hypertension. HR is controlled, 85bpm by auscultation. Continue digoxin, lasix, and diltiazem. May discharge to skilled nursing center and advise outpatient follow up with Dr. Welton FlakesKhan.  Principal Problem:   Acute respiratory failure with hypoxia (HCC) Active Problems:   Acute on chronic diastolic CHF (congestive heart failure) (HCC)   Atrial fibrillation with RVR (HCC)    Caroleen HammanKristin Tatym Schermer, NP-C 01/07/2018 12:49 PM Cell: [phone]

## 2018-01-07 NOTE — Progress Notes (Signed)
Physical Therapy Treatment Patient Details Name: Austin [name] MRN: [id number] DOB: [birth year] Today's Date: 01/07/2018    History of Present Illness presented to ER from [facility] secondary to SOB, respiratory distress; admitted with acute respiratory failure with hypoxia secondary to CHF, Afib with RVR (initially requiring cardizem drip).  Of note, patient with two recent hospitalizations secondary to CHF (1) and renal failure related to ureteral stone s/p nephrostomy tube placement (2)    PT Comments    Pt is making minimal progress towards goals, still limited by SOB with exertion. Pt on 5L of O2, however is mouth breathing entire session. Encouraged to breathe with nose. Pulse ox unable to accurately read on hands. Good tolerance for there-ex and pt motivated to sit at EOB, however fatigues quickly. Not safe for OOB mobility at this time. Appears motivated to participate. Will continue to progress.   Follow Up Recommendations  SNF     Equipment Recommendations  Rolling walker with 5" wheels    Recommendations for Other Services       Precautions / Restrictions Precautions Precautions: Fall Restrictions Weight Bearing Restrictions: No    Mobility  Bed Mobility Overal bed mobility: Needs Assistance Bed Mobility: Supine to Sit;Sit to Supine     Supine to sit: Max assist;+2 for physical assistance Sit to supine: Max assist;+2 for physical assistance   General bed mobility comments: Pt able to slightly initiate movement, however needs heavy assist for fully body to transition to sitting at EOB. Once seated, able to sit with cga with single UE support. Noted in flexed position and head down. Educated on proper posture to encourage decreased work of breathing. Pt fatigues with seated balance after 2-3 minutes and requires return to bed. +2 assist for sit->supine and sliding up in bed as well as positioning.  Transfers                 General transfer comment:  unsafe/unable to attempt due to fatigue/SOB  Ambulation/Gait                 Stairs            Wheelchair Mobility    Modified Rankin (Stroke Patients Only)       Balance                                            Cognition Arousal/Alertness: Awake/alert Behavior During Therapy: Flat affect;WFL for tasks assessed/performed Overall Cognitive Status: Within Functional Limits for tasks assessed                                 General Comments: speaks only when spoken to      Exercises Other Exercises Other Exercises: supine ther-ex including ankle pumps, quad sets, SLRs, heel slides, LAQ, and hip abd/add. 1 x 10 reps with mod assist. LAQ performed while seated EOB. Cues given for correct technique and frequent rest break required. Pulse ox unable to read on either hand, however pt SOB with minimal exertion    General Comments        Pertinent Vitals/Pain Pain Assessment: No/denies pain    Home Living                      Prior Function  PT Goals (current goals can now be found in the care plan section) Acute Rehab PT Goals Patient Stated Goal: to try a little PT Goal Formulation: With patient/family Time For Goal Achievement: 01/14/18 Potential to Achieve Goals: Good Progress towards PT goals: Progressing toward goals    Frequency    Min 2X/week      PT Plan Current plan remains appropriate    Co-evaluation              AM-PAC PT "6 Clicks" Daily Activity  Outcome Measure  Difficulty turning over in bed (including adjusting bedclothes, sheets and blankets)?: Unable Difficulty moving from lying on back to sitting on the side of the bed? : Unable Difficulty sitting down on and standing up from a chair with arms (e.g., wheelchair, bedside commode, etc,.)?: Unable Help needed moving to and from a bed to chair (including a wheelchair)?: Total Help needed walking in hospital room?:  Total Help needed climbing 3-5 steps with a railing? : Total 6 Click Score: 6    End of Session Equipment Utilized During Treatment: Oxygen Activity Tolerance: Patient limited by fatigue Patient left: in bed;with bed alarm set Nurse Communication: Mobility status PT Visit Diagnosis: Muscle weakness (generalized) (M62.81);Difficulty in walking, not elsewhere classified (R26.2)     Time: [id number] PT Time Calculation (min) (ACUTE ONLY): 26 min  Charges:  $Therapeutic Exercise: 8-22 mins $Therapeutic Activity: 8-22 mins                    G Codes:       Elizabeth PalauStephanie Bo Teicher, PT, DPT [phone]    Maikayla Beggs 01/07/2018, 10:20 AM

## 2018-01-07 NOTE — Progress Notes (Signed)
[facility] and report given to RN, pt will transfer with 6 L O2 nasal cannula [facility]

## 2018-01-07 NOTE — Clinical Social Work Note (Addendum)
[facility]  Patient and family agreeable to plans will transport via ems RN to call report [phone].  Patient's wife was at bedside and is aware of patient discharging.  Windell MouldingEric Tyliah Schlereth, MSW, Theresia MajorsLCSWA [phone]

## 2018-01-07 NOTE — Discharge Summary (Addendum)
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 Acute lower UTI 07/16/2017  . Asthma   . Atrial fibrillation (HCC)   . CHF (congestive heart failure) (HCC)   . Chronic atrial fibrillation (HCC)   . 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 COPD (chronic obstructive pulmonary disease) (HCC)   . Diabetes mellitus without complication (HCC)   . DVT (deep venous thrombosis) (HCC)   . Gastric ulcer   . GERD (gastroesophageal reflux disease)   . Hypercholesteremia   . Hypertension   . Moderate to severe pulmonary hypertension (HCC)--per 2-D echo 07/14/2017 07/15/2017  . PE (pulmonary embolism)   . Sepsis due to undetermined organism with acute respiratory failure (HCC) 07/13/2017    HOSPITAL COURSE:   JamesSellarsis a77 y.o.malewith a known history of CHF- EF 50% in recent echo, COPD, Ch A fib, Htn, HLD, PE, recent admissions - first for CHF and then for renal failure due to Ureteral stone- s/p nephrostomy tube and d/c to rehab.  PT Was very SOB, so sent  to ER today. At baseline pt is on 3 ltr oxygen. Required Bipap in ER due to CHF, have A fib with  RVR and on cardizem drip  1.  Acute chronic hypoxic respiratory failure secondary to congestive heart failure acute on chronic diastolic and severe emphysema -Patient uses CPAP as needed at nighttime -Received IV Lasix 40 twice daily--changed to oral Lasix -Echo showed EF of 50% -good Uop -Patient has very poor lung reserve given his severe emphysema. His stas are stable at 4liter Bacon oxygen. sats drop down with activity but recover.   -Asked about palliative care to discuss goals of care and wife was initially agreeable but then declined -Working with  case management if CPAP/noninvasive ventilator can be arranged at the rehab place--- will benefit from it given his severe end-stage COPD/emphysema -BIPAP settings are IPAP 12, EPAP 5 and 40% oxygen back up rate of 8  2. A. fib with RVR with pulmonary HTN -Rate in the 90s.  Patient is on Cardizem and digoxin -Cardiology consultation with Dr Grafton Folk continue current meds  3.  Acute on chronic renal failure CKD stage III -Baseline creatinine 1.3 -Came in with creatinine 1.9--- 1.7-- 1.66--1.68  4.  Obstructive uropathy with left ureteral stone and status post left nephrostomy tube placement -pt will follow with urology up as outpatient--patient has appointment with Dr. Apolinar Junes  5.  Diabetes -Sliding scale insulin -Continue low-dose Lantus  6.  DVT prophylaxis subcu Lovenox  Spoke with wife Austin [name].  Patient will go to rehab today once an IV/BiPAP is set up.  I discussed at length with wife that patient is at a very high risk for readmission given his poor lung and heart condition.  She voiced understanding.  She is okay with the plan.  CONSULTS OBTAINED:  Treatment Team:  Laurier Nancy, MD  DRUG ALLERGIES:   Allergies  Allergen Reactions  . Contrast Media [Iodinated Diagnostic Agents] Nausea And Vomiting  . Other Other (See Comments)    Cannot have "high doses of anesthesia because of his lungs"     DISCHARGE MEDICATIONS:   Allergies as of 01/07/2018      Reactions   Contrast Media [iodinated Diagnostic Agents] Nausea And Vomiting   Other Other (See Comments)   Cannot have "high doses of anesthesia because of his lungs"      Medication List    STOP taking these medications   glipiZIDE 5 MG tablet Commonly known as:  GLUCOTROL   lactulose 10 GM/15ML solution Commonly known as:  CHRONULAC     TAKE these medications   ascorbic acid 250 MG tablet Commonly known as:  VITAMIN C Take 1 tablet (250 mg total) by mouth 2 (two) times daily.   aspirin EC 81 MG tablet Take 81 mg by mouth daily.   budesonide-formoterol 160-4.5 MCG/ACT inhaler Commonly known as:  SYMBICORT Inhale 1 puff into the lungs 2 (two) times daily.   digoxin 0.125 MG tablet Commonly known as:  LANOXIN Take 1 tablet (0.125 mg total) by mouth daily.   diltiazem 180 MG 24 hr capsule Commonly known as:  CARDIZEM CD Take 1 capsule (180 mg total) by mouth daily.   feeding supplement (ENSURE ENLIVE) Liqd Take 237 mLs by mouth 2 (two) times daily between meals.   furosemide 40 MG tablet Commonly known as:  LASIX Take 1 tablet (40 mg total) by mouth 2 (two) times daily.   guaiFENesin-dextromethorphan 100-10 MG/5ML syrup Commonly known as:  ROBITUSSIN DM Take 10 mLs by mouth every 6 (six) hours as needed for cough.   insulin aspart 100 UNIT/ML injection Commonly known as:  novoLOG Inject 0-5 Units into the skin at bedtime. CBG < 70: implement hypoglycemia protocol CBG 70 - 120: 0 units CBG 121 - 150: 0 units CBG 151 - 200: 0 units CBG 201 - 250: 2 units CBG 251 - 300: 3 units CBG 301 - 350: 4 units CBG 351 - 400: 5 units CBG > 400: call MD and obtain STAT lab verification   insulin aspart 100 UNIT/ML injection Commonly known as:  novoLOG Inject 0-15 Units into the skin 3 (three) times daily with meals. CBG < 70: implement hypoglycemia protocol CBG 70 - 120: 0 units CBG 121 - 150: 2 units CBG  151 - 200: 3 units CBG 201 - 250: 5 units CBG 251 - 300: 8 units CBG 301 - 350: 11 units CBG 351 - 400: 15 units CBG > 400: call MD and obtain STAT lab verification   insulin glargine 100 UNIT/ML injection Commonly known as:  LANTUS Inject 0.1 mLs (10 Units total) into the skin daily.   ipratropium-albuterol 0.5-2.5 (3) MG/3ML Soln Commonly known as:  DUONEB Take 3 mLs by nebulization every 6 (six) hours.   lovastatin 20 MG tablet Commonly known as:  MEVACOR Take 20 mg by mouth at bedtime. Pt should take with food.   mupirocin cream 2 % Commonly known as:  BACTROBAN Apply topically 2 (two) times daily.   nicotine 14 mg/24hr patch Commonly known as:  NICODERM CQ - dosed in mg/24 hours Place 1 patch (14 mg total) onto the skin daily.   nitroGLYCERIN 0.4 MG SL tablet Commonly known as:  NITROSTAT Place 0.4 mg under the  tongue every 5 (five) minutes x 3 doses as needed for chest pain. If no relief call 911 or go to emergency room.   omeprazole 40 MG capsule Commonly known as:  PRILOSEC Take 40 mg by mouth daily.   oxyCODONE 5 MG immediate release tablet Commonly known as:  Oxy IR/ROXICODONE Take 1 tablet (5 mg total) by mouth every 4 (four) hours as needed for moderate pain.   polyethylene glycol packet Commonly known as:  MIRALAX Take 17 g by mouth daily as needed for moderate constipation.   PROAIR HFA 108 (90 Base) MCG/ACT inhaler Generic drug:  albuterol Inhale 1-2 puffs into the lungs every 6 (six) hours as needed for wheezing or shortness of breath.       If you experience worsening of your admission symptoms, develop shortness of breath, life threatening emergency, suicidal or homicidal thoughts you must seek medical attention immediately by calling 911 or calling your MD immediately  if symptoms less severe.  You Must read complete instructions/literature along with all the possible adverse reactions/side effects for all the Medicines you take and that have  been prescribed to you. Take any new Medicines after you have completely understood and accept all the possible adverse reactions/side effects.   Please note  You were cared for by a hospitalist during your hospital stay. If you have any questions about your discharge medications or the care you received while you were in the hospital after you are discharged, you can call the unit and asked to speak with the hospitalist on call if the hospitalist that took care of you is not available. Once you are discharged, your primary care physician will handle any further medical issues. Please note that NO REFILLS for any discharge medications will be authorized once you are discharged, as it is imperative that you return to your primary care physician (or establish a relationship with a primary care physician if you do not have one) for your aftercare needs so that they can reassess your need for medications and monitor your lab values. Today   SUBJECTIVE   No new complaints  VITAL SIGNS:  Blood pressure 124/82, pulse 85, temperature 98 F (36.7 C), resp. rate 18, height 6' (1.829 m), weight 88.2 kg (194 lb 8 oz), SpO2 91 %.  I/O:    Intake/Output Summary (Last 24 hours) at 01/07/2018 1409 Last data filed at 01/07/2018 0956 Gross per 24 hour  Intake 840 ml  Output 850 ml  Net -10 ml    PHYSICAL EXAMINATION:  GENERAL:  77 y.o.-year-old patient lying in the bed with no acute distress.  Chronically ill, weak and deconditioned EYES: Pupils equal, round, reactive to light and accommodation. No scleral icterus. Extraocular muscles intact.  HEENT: Head atraumatic, normocephalic. Oropharynx and nasopharynx clear.  NECK:  Supple, no jugular venous distention. No thyroid enlargement, no tenderness.  LUNGS: Distant breath sounds bilaterally, no wheezing, rales,rhonchi or crepitation. No use of accessory muscles of respiration.  CARDIOVASCULAR: S1, S2 normal. No murmurs, rubs, or gallops.  ABDOMEN: Soft,  non-tender, non-distended. Bowel sounds present. No organomegaly or mass.  EXTREMITIES: No pedal edema, cyanosis, or clubbing.  NEUROLOGIC: Cranial nerves II through XII are intact.  Weak  sensation intact. Gait not checked.  PSYCHIATRIC: The patient is alert and oriented x 2  SKIN: No obvious rash, lesion, or ulcer.   DATA REVIEW:   CBC  Recent Labs  Lab 01/02/18 0417  WBC 8.8  HGB 14.8  HCT 47.2  PLT 151  Chemistries  Recent Labs  Lab 01/01/18 0534  01/04/18 0611  NA 137   < > 136  K 5.0   < > 4.7  CL 101   < > 99*  CO2 26   < > 26  GLUCOSE 143*   < > 171*  BUN 53*   < > 45*  CREATININE 1.66*   < > 1.68*  CALCIUM 9.7   < > 9.9  MG 2.3  --   --    < > = values in this interval not displayed.    Microbiology Results   Recent Results (from the past 240 hour(s))  MRSA PCR Screening     Status: None   Collection Time: 12/30/17  9:23 PM  Result Value Ref Range Status   MRSA by PCR NEGATIVE NEGATIVE Final    Comment:        The GeneXpert MRSA Assay (FDA approved for NASAL specimens only), is one component of a comprehensive MRSA colonization surveillance program. It is not intended to diagnose MRSA infection nor to guide or monitor treatment for MRSA infections. Performed at [facility], [address] [place], [place] [address]     RADIOLOGY:  Cathleen Corti 2 Views  Result Date: 01/05/2018 CLINICAL DATA:  Abdominal pain and distention.  Sepsis. EXAM: ABDOMEN - 2 VIEW COMPARISON:  12/30/2017 FINDINGS: Diffuse colonic dilatation shows no significant change. Large amount of stool is again seen. Left percutaneous nephrostomy tube remains in place. IMPRESSION: No significant change in diffuse colonic dilatation and large stool burden. Electronically Signed   By: Myles Rosenthal M.D.   On: 01/05/2018 21:44     Management plans discussed with the patient, family and they are in agreement.  CODE STATUS:     Code Status Orders  (From admission, onward)         Start     Ordered   12/30/17 2114  Full code  Continuous     12/30/17 2113    Code Status History    Date Active Date Inactive Code Status Order ID Comments User Context   12/26/2017 12:16 12/29/2017 16:55 Full Code [id number]  Adrian Saran, MD Inpatient   12/07/2017 01:35 12/13/2017 18:58 Full Code [id number]  Oralia Manis, MD Inpatient   11/06/2017 19:46 11/09/2017 14:55 Full Code [id number]  Houston Siren, MD Inpatient   07/14/2017 00:55 07/21/2017 19:12 Full Code [id number]  Bobette Mo, MD Inpatient   12/26/2016 23:05 12/27/2016 20:34 Full Code [id number]  Auburn Bilberry, MD Inpatient   10/31/2015 09:34 11/03/2015 20:11 Full Code [id number]  Arnaldo Natal, MD Inpatient   09/16/2015 01:49 09/16/2015 18:22 Full Code [id number]  Arnaldo Natal, MD ED   08/24/2015 17:42 08/27/2015 16:10 Full Code [id number]  Auburn Bilberry, MD Inpatient   08/16/2015 03:55 08/17/2015 20:22 Full Code [id number]  Oralia Manis, MD Inpatient   07/04/2015 14:22 07/08/2015 16:04 Full Code [id number]  Altamese Dilling, MD Inpatient      TOTAL TIME TAKING CARE OF THIS PATIENT: *40* minutes.    Enedina Finner M.D on 01/07/2018 at 2:09 PM  Between 7am to 6pm - Pager - [phone] After 6pm go to [email or web address] - Social research officer, government  [facility] [place] [facility]  Office  [phone]  CC: Primary care physician; [facility]

## 2018-01-08 ENCOUNTER — Inpatient Hospital Stay
Admission: EM | Admit: 2018-01-08 | Discharge: 2018-01-11 | DRG: 208 | Disposition: E | Payer: Medicare Other | Attending: Pulmonary Disease | Admitting: Pulmonary Disease

## 2018-01-08 ENCOUNTER — Inpatient Hospital Stay: Payer: Medicare Other

## 2018-01-08 ENCOUNTER — Encounter: Payer: Self-pay | Admitting: *Deleted

## 2018-01-08 ENCOUNTER — Other Ambulatory Visit: Payer: Self-pay

## 2018-01-08 ENCOUNTER — Inpatient Hospital Stay (HOSPITAL_COMMUNITY)
Admit: 2018-01-08 | Discharge: 2018-01-08 | Disposition: A | Discharge status: Home / Self Care | Payer: Medicare Other | Attending: Internal Medicine | Admitting: Internal Medicine

## 2018-01-08 ENCOUNTER — Emergency Department: Payer: Medicare Other

## 2018-01-08 DIAGNOSIS — J9601 Acute respiratory failure with hypoxia: Secondary | ICD-10-CM | POA: Diagnosis not present

## 2018-01-08 DIAGNOSIS — J690 Pneumonitis due to inhalation of food and vomit: Secondary | ICD-10-CM | POA: Diagnosis present

## 2018-01-08 DIAGNOSIS — N132 Hydronephrosis with renal and ureteral calculous obstruction: Secondary | ICD-10-CM | POA: Diagnosis present

## 2018-01-08 DIAGNOSIS — K219 Gastro-esophageal reflux disease without esophagitis: Secondary | ICD-10-CM | POA: Diagnosis present

## 2018-01-08 DIAGNOSIS — R34 Anuria and oliguria: Secondary | ICD-10-CM | POA: Diagnosis not present

## 2018-01-08 DIAGNOSIS — Z87440 Personal history of urinary (tract) infections: Secondary | ICD-10-CM

## 2018-01-08 DIAGNOSIS — R578 Other shock: Secondary | ICD-10-CM | POA: Diagnosis present

## 2018-01-08 DIAGNOSIS — I272 Pulmonary hypertension, unspecified: Secondary | ICD-10-CM | POA: Diagnosis present

## 2018-01-08 DIAGNOSIS — Y95 Nosocomial condition: Secondary | ICD-10-CM | POA: Diagnosis present

## 2018-01-08 DIAGNOSIS — Z7951 Long term (current) use of inhaled steroids: Secondary | ICD-10-CM

## 2018-01-08 DIAGNOSIS — I130 Hypertensive heart and chronic kidney disease with heart failure and stage 1 through stage 4 chronic kidney disease, or unspecified chronic kidney disease: Secondary | ICD-10-CM | POA: Diagnosis present

## 2018-01-08 DIAGNOSIS — Z86711 Personal history of pulmonary embolism: Secondary | ICD-10-CM

## 2018-01-08 DIAGNOSIS — E875 Hyperkalemia: Secondary | ICD-10-CM | POA: Diagnosis not present

## 2018-01-08 DIAGNOSIS — I482 Chronic atrial fibrillation: Secondary | ICD-10-CM | POA: Diagnosis present

## 2018-01-08 DIAGNOSIS — R69 Illness, unspecified: Secondary | ICD-10-CM | POA: Diagnosis not present

## 2018-01-08 DIAGNOSIS — E7800 Pure hypercholesterolemia, unspecified: Secondary | ICD-10-CM | POA: Diagnosis present

## 2018-01-08 DIAGNOSIS — Z8249 Family history of ischemic heart disease and other diseases of the circulatory system: Secondary | ICD-10-CM

## 2018-01-08 DIAGNOSIS — Z794 Long term (current) use of insulin: Secondary | ICD-10-CM

## 2018-01-08 DIAGNOSIS — F17210 Nicotine dependence, cigarettes, uncomplicated: Secondary | ICD-10-CM | POA: Diagnosis present

## 2018-01-08 DIAGNOSIS — Z452 Encounter for adjustment and management of vascular access device: Secondary | ICD-10-CM

## 2018-01-08 DIAGNOSIS — Z515 Encounter for palliative care: Secondary | ICD-10-CM | POA: Diagnosis present

## 2018-01-08 DIAGNOSIS — Z8711 Personal history of peptic ulcer disease: Secondary | ICD-10-CM

## 2018-01-08 DIAGNOSIS — K920 Hematemesis: Secondary | ICD-10-CM | POA: Diagnosis present

## 2018-01-08 DIAGNOSIS — Z0189 Encounter for other specified special examinations: Secondary | ICD-10-CM

## 2018-01-08 DIAGNOSIS — Z79899 Other long term (current) drug therapy: Secondary | ICD-10-CM

## 2018-01-08 DIAGNOSIS — E1122 Type 2 diabetes mellitus with diabetic chronic kidney disease: Secondary | ICD-10-CM | POA: Diagnosis present

## 2018-01-08 DIAGNOSIS — Z91041 Radiographic dye allergy status: Secondary | ICD-10-CM

## 2018-01-08 DIAGNOSIS — R609 Edema, unspecified: Secondary | ICD-10-CM

## 2018-01-08 DIAGNOSIS — Z66 Do not resuscitate: Secondary | ICD-10-CM | POA: Diagnosis present

## 2018-01-08 DIAGNOSIS — N179 Acute kidney failure, unspecified: Secondary | ICD-10-CM | POA: Diagnosis present

## 2018-01-08 DIAGNOSIS — J441 Chronic obstructive pulmonary disease with (acute) exacerbation: Secondary | ICD-10-CM | POA: Diagnosis present

## 2018-01-08 DIAGNOSIS — I5023 Acute on chronic systolic (congestive) heart failure: Secondary | ICD-10-CM

## 2018-01-08 DIAGNOSIS — I700 Atherosclerosis of aorta: Secondary | ICD-10-CM | POA: Diagnosis present

## 2018-01-08 DIAGNOSIS — I5033 Acute on chronic diastolic (congestive) heart failure: Secondary | ICD-10-CM | POA: Diagnosis present

## 2018-01-08 DIAGNOSIS — Z884 Allergy status to anesthetic agent status: Secondary | ICD-10-CM

## 2018-01-08 DIAGNOSIS — J9620 Acute and chronic respiratory failure, unspecified whether with hypoxia or hypercapnia: Secondary | ICD-10-CM | POA: Diagnosis present

## 2018-01-08 DIAGNOSIS — N183 Chronic kidney disease, stage 3 (moderate): Secondary | ICD-10-CM | POA: Diagnosis present

## 2018-01-08 DIAGNOSIS — R042 Hemoptysis: Secondary | ICD-10-CM | POA: Diagnosis present

## 2018-01-08 DIAGNOSIS — J189 Pneumonia, unspecified organism: Secondary | ICD-10-CM

## 2018-01-08 DIAGNOSIS — J9690 Respiratory failure, unspecified, unspecified whether with hypoxia or hypercapnia: Secondary | ICD-10-CM

## 2018-01-08 DIAGNOSIS — L8990 Pressure ulcer of unspecified site, unspecified stage: Secondary | ICD-10-CM

## 2018-01-08 DIAGNOSIS — J9621 Acute and chronic respiratory failure with hypoxia: Secondary | ICD-10-CM | POA: Diagnosis present

## 2018-01-08 DIAGNOSIS — Z7982 Long term (current) use of aspirin: Secondary | ICD-10-CM

## 2018-01-08 LAB — BLOOD GAS, ARTERIAL
ACID-BASE EXCESS: 0.2 mmol/L (ref 0.0–2.0)
Bicarbonate: 28.7 mmol/L — ABNORMAL HIGH (ref 20.0–28.0)
FIO2: 0.5
MECHVT: 500 mL
O2 Saturation: 94.3 %
PATIENT TEMPERATURE: 37
PEEP: 5 cmH2O
PH ART: 7.28 — AB (ref 7.350–7.450)
PO2 ART: 81 mmHg — AB (ref 83.0–108.0)
RATE: 15 resp/min
pCO2 arterial: 61 mmHg — ABNORMAL HIGH (ref 32.0–48.0)

## 2018-01-08 LAB — BASIC METABOLIC PANEL
ANION GAP: 7 (ref 5–15)
BUN: 51 mg/dL — ABNORMAL HIGH (ref 6–20)
CO2: 28 mmol/L (ref 22–32)
Calcium: 9.4 mg/dL (ref 8.9–10.3)
Chloride: 100 mmol/L — ABNORMAL LOW (ref 101–111)
Creatinine, Ser: 1.57 mg/dL — ABNORMAL HIGH (ref 0.61–1.24)
GFR calc Af Amer: 48 mL/min — ABNORMAL LOW (ref >60)
GFR calc non Af Amer: 41 mL/min — ABNORMAL LOW (ref >60)
GLUCOSE: 100 mg/dL — AB (ref 65–99)
POTASSIUM: 5 mmol/L (ref 3.5–5.1)
Sodium: 135 mmol/L (ref 135–145)

## 2018-01-08 LAB — COMPREHENSIVE METABOLIC PANEL WITH GFR
ALT: 22 U/L (ref 17–63)
AST: 27 U/L (ref 15–41)
Albumin: 3 g/dL — ABNORMAL LOW (ref 3.5–5.0)
Alkaline Phosphatase: 107 U/L (ref 38–126)
Anion gap: 8 (ref 5–15)
BUN: 50 mg/dL — ABNORMAL HIGH (ref 6–20)
CO2: 28 mmol/L (ref 22–32)
Calcium: 9.6 mg/dL (ref 8.9–10.3)
Chloride: 98 mmol/L — ABNORMAL LOW (ref 101–111)
Creatinine, Ser: 1.71 mg/dL — ABNORMAL HIGH (ref 0.61–1.24)
GFR calc Af Amer: 43 mL/min — ABNORMAL LOW
GFR calc non Af Amer: 37 mL/min — ABNORMAL LOW
Glucose, Bld: 98 mg/dL (ref 65–99)
Potassium: 4.9 mmol/L (ref 3.5–5.1)
Sodium: 134 mmol/L — ABNORMAL LOW (ref 135–145)
Total Bilirubin: 1.9 mg/dL — ABNORMAL HIGH (ref 0.3–1.2)
Total Protein: 5.9 g/dL — ABNORMAL LOW (ref 6.5–8.1)

## 2018-01-08 LAB — PROTIME-INR
INR: 1.24
INR: 1.37
PROTHROMBIN TIME: 16.8 s — AB (ref 11.4–15.2)
Prothrombin Time: 15.5 seconds — ABNORMAL HIGH (ref 11.4–15.2)

## 2018-01-08 LAB — CBC WITH DIFFERENTIAL/PLATELET
Basophils Absolute: 0.1 10*3/uL (ref 0–0.1)
Basophils Relative: 1 %
Eosinophils Absolute: 0 10*3/uL (ref 0–0.7)
Eosinophils Relative: 0 %
HCT: 51.9 % (ref 40.0–52.0)
Hemoglobin: 16.5 g/dL (ref 13.0–18.0)
Lymphocytes Relative: 6 %
Lymphs Abs: 0.6 10*3/uL — ABNORMAL LOW (ref 1.0–3.6)
MCH: 29.4 pg (ref 26.0–34.0)
MCHC: 31.7 g/dL — ABNORMAL LOW (ref 32.0–36.0)
MCV: 92.8 fL (ref 80.0–100.0)
Monocytes Absolute: 0.9 10*3/uL (ref 0.2–1.0)
Monocytes Relative: 8 %
Neutro Abs: 9.5 10*3/uL — ABNORMAL HIGH (ref 1.4–6.5)
Neutrophils Relative %: 85 %
Platelets: 214 10*3/uL (ref 150–440)
RBC: 5.6 MIL/uL (ref 4.40–5.90)
RDW: 18.1 % — ABNORMAL HIGH (ref 11.5–14.5)
WBC: 11.1 10*3/uL — ABNORMAL HIGH (ref 3.8–10.6)

## 2018-01-08 LAB — CBC
HEMATOCRIT: 48.3 % (ref 40.0–52.0)
HEMOGLOBIN: 15.2 g/dL (ref 13.0–18.0)
MCH: 29.2 pg (ref 26.0–34.0)
MCHC: 31.4 g/dL — ABNORMAL LOW (ref 32.0–36.0)
MCV: 92.9 fL (ref 80.0–100.0)
Platelets: 173 10*3/uL (ref 150–440)
RBC: 5.2 MIL/uL (ref 4.40–5.90)
RDW: 17.6 % — ABNORMAL HIGH (ref 11.5–14.5)
WBC: 10.5 10*3/uL (ref 3.8–10.6)

## 2018-01-08 LAB — TROPONIN I
Troponin I: 0.35 ng/mL (ref <0.03)
Troponin I: 0.27 ng/mL (ref <0.03)

## 2018-01-08 LAB — TYPE AND SCREEN
ABO/RH(D): O POS
Antibody Screen: NEGATIVE

## 2018-01-08 LAB — LACTIC ACID, PLASMA
LACTIC ACID, VENOUS: 1.2 mmol/L (ref 0.5–1.9)
Lactic Acid, Venous: 2 mmol/L (ref 0.5–1.9)
Lactic Acid, Venous: 1.7 mmol/L (ref 0.5–1.9)

## 2018-01-08 LAB — FIBRINOGEN: Fibrinogen: 342 mg/dL (ref 210–475)

## 2018-01-08 LAB — DIGOXIN LEVEL: DIGOXIN LVL: 1.9 ng/mL (ref 0.8–2.0)

## 2018-01-08 LAB — GLUCOSE, CAPILLARY
GLUCOSE-CAPILLARY: 96 mg/dL (ref 65–99)
Glucose-Capillary: 77 mg/dL (ref 65–99)

## 2018-01-08 LAB — BRAIN NATRIURETIC PEPTIDE: B Natriuretic Peptide: 1352 pg/mL — ABNORMAL HIGH (ref 0.0–100.0)

## 2018-01-08 LAB — PHOSPHORUS: PHOSPHORUS: 3.5 mg/dL (ref 2.5–4.6)

## 2018-01-08 LAB — FIBRIN DERIVATIVES D-DIMER (ARMC ONLY): FIBRIN DERIVATIVES D-DIMER (ARMC): 3882.54 ng{FEU}/mL — AB (ref 0.00–499.00)

## 2018-01-08 LAB — MAGNESIUM: Magnesium: 1.9 mg/dL (ref 1.7–2.4)

## 2018-01-08 LAB — PROCALCITONIN: Procalcitonin: 0.19 ng/mL

## 2018-01-08 LAB — MRSA PCR SCREENING: MRSA BY PCR: NEGATIVE

## 2018-01-08 MED ORDER — PANTOPRAZOLE SODIUM 40 MG IV SOLR: 40.0000 mg | Freq: Two times a day (BID) | INTRAVENOUS | Status: DC

## 2018-01-08 MED ORDER — DOCUSATE SODIUM 100 MG PO CAPS: 100.0000 mg | ORAL_CAPSULE | Freq: Two times a day (BID) | ORAL | Status: DC | PRN

## 2018-01-08 MED ORDER — SODIUM CHLORIDE 0.9 % IV SOLN
8.0000 mg/h | INTRAVENOUS | Status: DC
  Administered 2018-01-08 – 2018-01-09 (×3): 8 mg/h via INTRAVENOUS
  Filled 2018-01-08 (×2): qty 80

## 2018-01-08 MED ORDER — FENTANYL 2500MCG IN NS 250ML (10MCG/ML) PREMIX INFUSION
INTRAVENOUS | Status: AC
  Administered 2018-01-08: 100 ug/h via INTRAVENOUS
  Filled 2018-01-08: qty 250

## 2018-01-08 MED ORDER — INSULIN GLARGINE 100 UNIT/ML ~~LOC~~ SOLN
10.0000 [IU] | Freq: Every day | SUBCUTANEOUS | Status: DC
  Filled 2018-01-08 (×2): qty 0.1

## 2018-01-08 MED ORDER — ORAL CARE MOUTH RINSE
15.0000 mL | Freq: Four times a day (QID) | OROMUCOSAL | Status: DC
  Administered 2018-01-09 (×3): 15 mL via OROMUCOSAL

## 2018-01-08 MED ORDER — DEXTROSE 5 % IV SOLN
2.0000 g | Freq: Two times a day (BID) | INTRAVENOUS | Status: DC
  Administered 2018-01-08: 2 g via INTRAVENOUS
  Filled 2018-01-08 (×2): qty 2

## 2018-01-08 MED ORDER — PANTOPRAZOLE SODIUM 40 MG IV SOLR: 40.0000 mg | Freq: Every day | INTRAVENOUS | Status: DC

## 2018-01-08 MED ORDER — PRAVASTATIN SODIUM 20 MG PO TABS: 20.0000 mg | ORAL_TABLET | Freq: Every day | ORAL | Status: DC

## 2018-01-08 MED ORDER — VITAMIN C 250 MG PO TABS: 250.0000 mg | ORAL_TABLET | Freq: Two times a day (BID) | ORAL | Status: DC

## 2018-01-08 MED ORDER — FENTANYL CITRATE (PF) 100 MCG/2ML IJ SOLN
50.0000 ug | INTRAMUSCULAR | Status: DC | PRN
  Administered 2018-01-08: 50 ug via INTRAVENOUS

## 2018-01-08 MED ORDER — VECURONIUM BROMIDE 10 MG IV SOLR
INTRAVENOUS | Status: AC
  Administered 2018-01-08: 10 mg via INTRAVENOUS
  Filled 2018-01-08: qty 10

## 2018-01-08 MED ORDER — DIGOXIN 125 MCG PO TABS: 0.1250 mg | ORAL_TABLET | Freq: Every day | ORAL | Status: DC

## 2018-01-08 MED ORDER — INSULIN ASPART 100 UNIT/ML ~~LOC~~ SOLN: 0.0000 [IU] | Freq: Every day | SUBCUTANEOUS | Status: DC

## 2018-01-08 MED ORDER — SODIUM CHLORIDE 0.9 % IV SOLN
80.0000 mg | Freq: Once | INTRAVENOUS | Status: AC
  Administered 2018-01-08: 21:00:00 80 mg via INTRAVENOUS
  Filled 2018-01-08: qty 80

## 2018-01-08 MED ORDER — INSULIN ASPART 100 UNIT/ML ~~LOC~~ SOLN: 0.0000 [IU] | Freq: Three times a day (TID) | SUBCUTANEOUS | Status: DC

## 2018-01-08 MED ORDER — POLYETHYLENE GLYCOL 3350 17 G PO PACK: 17.0000 g | PACK | Freq: Every day | ORAL | Status: DC | PRN

## 2018-01-08 MED ORDER — PIPERACILLIN-TAZOBACTAM 3.375 G IVPB
3.3750 g | Freq: Three times a day (TID) | INTRAVENOUS | Status: DC
  Administered 2018-01-09 (×2): 3.375 g via INTRAVENOUS
  Filled 2018-01-08 (×2): qty 50

## 2018-01-08 MED ORDER — MIDAZOLAM HCL 2 MG/2ML IJ SOLN
2.0000 mg | INTRAMUSCULAR | Status: DC | PRN
  Administered 2018-01-08: 2 mg via INTRAVENOUS
  Filled 2018-01-08: qty 2

## 2018-01-08 MED ORDER — FUROSEMIDE 10 MG/ML IJ SOLN
40.0000 mg | Freq: Once | INTRAMUSCULAR | Status: AC
  Administered 2018-01-08: 40 mg via INTRAVENOUS
  Filled 2018-01-08: qty 4

## 2018-01-08 MED ORDER — TIOTROPIUM BROMIDE MONOHYDRATE 18 MCG IN CAPS
18.0000 ug | ORAL_CAPSULE | Freq: Every day | RESPIRATORY_TRACT | Status: DC
  Filled 2018-01-08: qty 5

## 2018-01-08 MED ORDER — METHYLPREDNISOLONE SODIUM SUCC 125 MG IJ SOLR: 60.0000 mg | Freq: Four times a day (QID) | INTRAMUSCULAR | Status: DC

## 2018-01-08 MED ORDER — PIPERACILLIN-TAZOBACTAM 3.375 G IVPB: 3.3750 g | Freq: Three times a day (TID) | INTRAVENOUS | Status: DC

## 2018-01-08 MED ORDER — FENTANYL CITRATE (PF) 100 MCG/2ML IJ SOLN
50.0000 ug | INTRAMUSCULAR | Status: DC | PRN
  Administered 2018-01-08: 50 ug via INTRAVENOUS
  Filled 2018-01-08: qty 2

## 2018-01-08 MED ORDER — IPRATROPIUM-ALBUTEROL 0.5-2.5 (3) MG/3ML IN SOLN: 3.0000 mL | Freq: Four times a day (QID) | RESPIRATORY_TRACT | Status: DC

## 2018-01-08 MED ORDER — NOREPINEPHRINE BITARTRATE 1 MG/ML IV SOLN
0.0000 ug/min | INTRAVENOUS | Status: DC
  Filled 2018-01-08: qty 4

## 2018-01-08 MED ORDER — MUPIROCIN CALCIUM 2 % EX CREA
1.0000 "application " | TOPICAL_CREAM | Freq: Two times a day (BID) | CUTANEOUS | Status: DC
  Filled 2018-01-08: qty 15

## 2018-01-08 MED ORDER — INSULIN ASPART 100 UNIT/ML ~~LOC~~ SOLN
0.0000 [IU] | SUBCUTANEOUS | Status: DC
  Administered 2018-01-09: 2 [IU] via SUBCUTANEOUS
  Filled 2018-01-08: qty 1

## 2018-01-08 MED ORDER — ALBUTEROL SULFATE HFA 108 (90 BASE) MCG/ACT IN AERS: 2.0000 | INHALATION_SPRAY | Freq: Four times a day (QID) | RESPIRATORY_TRACT | Status: DC | PRN

## 2018-01-08 MED ORDER — VANCOMYCIN HCL 10 G IV SOLR: 1250.0000 mg | INTRAVENOUS | Status: DC

## 2018-01-08 MED ORDER — VANCOMYCIN HCL 10 G IV SOLR
1250.0000 mg | INTRAVENOUS | Status: DC
  Filled 2018-01-08: qty 1250

## 2018-01-08 MED ORDER — CHLORHEXIDINE GLUCONATE 0.12% ORAL RINSE (MEDLINE KIT)
15.0000 mL | Freq: Two times a day (BID) | OROMUCOSAL | Status: DC
  Administered 2018-01-08 – 2018-01-09 (×2): 15 mL via OROMUCOSAL

## 2018-01-08 MED ORDER — MIDAZOLAM HCL 2 MG/2ML IJ SOLN
4.0000 mg | Freq: Once | INTRAMUSCULAR | Status: AC
  Administered 2018-01-08: 4 mg via INTRAVENOUS
  Filled 2018-01-08: qty 4

## 2018-01-08 MED ORDER — FENTANYL CITRATE (PF) 100 MCG/2ML IJ SOLN
200.0000 ug | Freq: Once | INTRAMUSCULAR | Status: AC
  Administered 2018-01-08: 200 ug via INTRAVENOUS
  Filled 2018-01-08: qty 4

## 2018-01-08 MED ORDER — PANTOPRAZOLE SODIUM 40 MG PO TBEC: 40.0000 mg | DELAYED_RELEASE_TABLET | Freq: Every day | ORAL | Status: DC

## 2018-01-08 MED ORDER — GUAIFENESIN-DM 100-10 MG/5ML PO SYRP
10.0000 mL | ORAL_SOLUTION | Freq: Four times a day (QID) | ORAL | Status: DC | PRN
  Filled 2018-01-08: qty 10

## 2018-01-08 MED ORDER — METHYLPREDNISOLONE SODIUM SUCC 125 MG IJ SOLR: 60.0000 mg | Freq: Three times a day (TID) | INTRAMUSCULAR | Status: DC

## 2018-01-08 MED ORDER — ACETAMINOPHEN 325 MG PO TABS: 650.0000 mg | ORAL_TABLET | Freq: Four times a day (QID) | ORAL | Status: DC | PRN

## 2018-01-08 MED ORDER — FUROSEMIDE 10 MG/ML IJ SOLN
20.0000 mg | Freq: Two times a day (BID) | INTRAMUSCULAR | Status: DC
  Administered 2018-01-08 – 2018-01-09 (×2): 20 mg via INTRAVENOUS
  Filled 2018-01-08 (×2): qty 2

## 2018-01-08 MED ORDER — ASPIRIN EC 81 MG PO TBEC: 81.0000 mg | DELAYED_RELEASE_TABLET | Freq: Every day | ORAL | Status: DC

## 2018-01-08 MED ORDER — THROMBIN (RECOMBINANT) 5000 UNITS EX SOLR
5000.0000 [IU] | Freq: Once | CUTANEOUS | Status: DC
  Filled 2018-01-08: qty 5000

## 2018-01-08 MED ORDER — DILTIAZEM HCL ER COATED BEADS 180 MG PO CP24: 180.0000 mg | ORAL_CAPSULE | Freq: Every day | ORAL | Status: DC

## 2018-01-08 MED ORDER — VANCOMYCIN HCL IN DEXTROSE 1-5 GM/200ML-% IV SOLN
1000.0000 mg | Freq: Once | INTRAVENOUS | Status: AC
  Administered 2018-01-08: 1000 mg via INTRAVENOUS
  Filled 2018-01-08: qty 200

## 2018-01-08 MED ORDER — VECURONIUM BROMIDE 10 MG IV SOLR
10.0000 mg | Freq: Once | INTRAVENOUS | Status: AC
  Administered 2018-01-08: 10 mg via INTRAVENOUS

## 2018-01-08 MED ORDER — IPRATROPIUM-ALBUTEROL 0.5-2.5 (3) MG/3ML IN SOLN
3.0000 mL | RESPIRATORY_TRACT | Status: DC
  Administered 2018-01-08 – 2018-01-09 (×4): 3 mL via RESPIRATORY_TRACT
  Filled 2018-01-08 (×4): qty 3

## 2018-01-08 MED ORDER — PIPERACILLIN-TAZOBACTAM 3.375 G IVPB 30 MIN
3.3750 g | Freq: Once | INTRAVENOUS | Status: AC
  Administered 2018-01-08: 3.375 g via INTRAVENOUS
  Filled 2018-01-08: qty 50

## 2018-01-08 MED ORDER — FENTANYL CITRATE (PF) 100 MCG/2ML IJ SOLN
50.0000 ug | INTRAMUSCULAR | Status: DC | PRN
  Administered 2018-01-08: 50 ug via INTRAVENOUS
  Filled 2018-01-08 (×2): qty 2

## 2018-01-08 MED ORDER — FENTANYL CITRATE (PF) 100 MCG/2ML IJ SOLN: 50.0000 ug | INTRAMUSCULAR | Status: DC | PRN

## 2018-01-08 MED ORDER — OXYCODONE HCL 5 MG PO TABS: 5.0000 mg | ORAL_TABLET | ORAL | Status: DC | PRN

## 2018-01-08 MED ORDER — VANCOMYCIN HCL 500 MG IV SOLR
500.0000 mg | Freq: Once | INTRAVENOUS | Status: DC
  Filled 2018-01-08: qty 500

## 2018-01-08 MED ORDER — FENTANYL 2500MCG IN NS 250ML (10MCG/ML) PREMIX INFUSION
25.0000 ug/h | INTRAVENOUS | Status: DC
  Administered 2018-01-08: 100 ug/h via INTRAVENOUS

## 2018-01-08 MED ORDER — BUDESONIDE 0.25 MG/2ML IN SUSP: 0.2500 mg | Freq: Two times a day (BID) | RESPIRATORY_TRACT | Status: DC

## 2018-01-08 MED ORDER — BUDESONIDE 0.5 MG/2ML IN SUSP
0.5000 mg | Freq: Two times a day (BID) | RESPIRATORY_TRACT | Status: DC
  Administered 2018-01-09: 0.5 mg via RESPIRATORY_TRACT
  Filled 2018-01-08: qty 2

## 2018-01-08 NOTE — ED Notes (Signed)
Pt and family have been updated on possibility of intubation. RN was present when pt informed MD he would like to be intubated if needed. Pt remains on Bipap at this time with suction at bedside in case pt continues to cough blood.   Pt just had multiple episodes of coughing up large amounts of bright red blood, RN was at bedside and bipap was removed, mouth suctioned and face cleaned.

## 2018-01-08 NOTE — ED Notes (Signed)
Lab informed Rn that 4th attempt at collecting a type and screen has hemolysed as well. RN has requested lab come to collect blood.

## 2018-01-08 NOTE — H&P (Signed)
[facility] [place] at [facility]   PATIENT NAME: Austin [name]    MR#:  [id number]  DATE OF BIRTH:  May 07, 1941  DATE OF ADMISSION:  12/12/2017  PRIMARY CARE PHYSICIAN: [facility]   REQUESTING/REFERRING PHYSICIAN: Williams  CHIEF COMPLAINT:   Chief Complaint  Patient presents with  . Respiratory Distress  . Hematemesis    HISTORY OF PRESENT ILLNESS: Grant Swager  is a 77 y.o. male with a known history of asthma, A. Fib, CHF, COPD, is diabetes, DVT, hypertension, PE- and recurrent admissions in last few weeks. He was discharged to rehabilitation after having a palliative care meeting also like yesterday- and was sent to rehabilitation with CPAP usage. Today morning sent back to emergency room with complaint of cough and some blood in there, was noted to be hypoxic in ER started on BiPAP on. Later on after talking to ICU physician and he tried to take him off the BiPAP but he failed and has to be kept on BiPAP so we have to admit to stepdown. His x-ray chest showed some infiltrate versus edema so there may be some healthcare associated pneumonia.  PAST MEDICAL HISTORY:   Past Medical History:  Diagnosis Date  . Acute lower UTI 07/16/2017  . Asthma   . Atrial fibrillation (HCC)   . CHF (congestive heart failure) (HCC)   . Chronic atrial fibrillation (HCC)   . 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 COPD (chronic obstructive pulmonary disease) (HCC)   . Diabetes mellitus without complication (HCC)   . DVT (deep venous thrombosis) (HCC)   . Gastric ulcer   . GERD (gastroesophageal reflux disease)   . Hypercholesteremia   . Hypertension   . Moderate to severe pulmonary hypertension (HCC)--per 2-D echo 07/14/2017 07/15/2017  . PE (pulmonary embolism)   . Sepsis due to undetermined organism with acute respiratory failure (HCC) 07/13/2017    PAST SURGICAL HISTORY:  Past Surgical History:  Procedure Laterality  Date  . HERNIA REPAIR    . IR NEPHROSTOMY PLACEMENT LEFT  12/26/2017  . PERIPHERAL VASCULAR CATHETERIZATION N/A 11/03/2015   Procedure: IVC Filter Insertion;  Surgeon: Annice Needy, MD;  Location: [facility] INVASIVE CV LAB;  Service: Cardiovascular;  Laterality: N/A;  . PERIPHERAL VASCULAR CATHETERIZATION N/A 07/24/2016   Procedure: IVC Filter Removal;  Surgeon: Annice Needy, MD;  Location: [facility] INVASIVE CV LAB;  Service: Cardiovascular;  Laterality: N/A;  . PROSTATE SURGERY      SOCIAL HISTORY:  Social History   Tobacco Use  . Smoking status: Current Every Day Smoker    Packs/day: 1.00    Years: 45.00    Pack years: 45.00    Types: Cigarettes  . Smokeless tobacco: Never Used  Substance Use Topics  . Alcohol use: No    FAMILY HISTORY:  Family History  Problem Relation Age of Onset  . CAD Brother   . Congestive Heart Failure Brother     DRUG ALLERGIES:  Allergies  Allergen Reactions  . Contrast Media [Iodinated Diagnostic Agents] Nausea And Vomiting  . Other Other (See Comments)    Cannot have "high doses of anesthesia because of his lungs"    REVIEW OF SYSTEMS:   Patient is drowsy.  MEDICATIONS AT HOME:  Prior to Admission medications   Medication Sig Start Date End Date Taking? Authorizing Provider  albuterol (PROAIR HFA) 108 (90 Base) MCG/ACT inhaler Inhale 2 puffs into the lungs every 6 (six) hours as needed for  wheezing or shortness of breath.    Yes [provider]  aspirin EC 81 MG tablet Take 81 mg by mouth daily.   Yes [provider]  budesonide-formoterol (SYMBICORT) 160-4.5 MCG/ACT inhaler Inhale 1 puff into the lungs 2 (two) times daily. 07/21/17  Yes Elliot CousinFisher, Denise, MD  digoxin (LANOXIN) 0.125 MG tablet Take 1 tablet (0.125 mg total) by mouth daily. 11/09/17  Yes Delfino LovettShah, Vipul, MD  diltiazem (CARDIZEM CD) 180 MG 24 hr capsule Take 1 capsule (180 mg total) by mouth daily. 01/05/18  Yes Enedina FinnerPatel, Sona, MD  furosemide (LASIX) 40 MG tablet Take 1 tablet  (40 mg total) by mouth 2 (two) times daily. 01/04/18  Yes Enedina FinnerPatel, Sona, MD  insulin glargine (LANTUS) 100 UNIT/ML injection Inject 0.1 mLs (10 Units total) into the skin daily. 12/13/17  Yes Gouru, Aruna, MD  insulin lispro (HUMALOG) 100 UNIT/ML injection Inject 0-15 Units into the skin 3 (three) times daily before meals.   Yes [provider]  ipratropium-albuterol (DUONEB) 0.5-2.5 (3) MG/3ML SOLN Take 3 mLs by nebulization every 6 (six) hours. 12/13/17  Yes Gouru, Deanna ArtisAruna, MD  lovastatin (MEVACOR) 20 MG tablet Take 20 mg by mouth at bedtime. Pt should take with food.    Yes [provider]  mupirocin cream (BACTROBAN) 2 % Apply topically 2 (two) times daily. 12/13/17  Yes Gouru, Deanna ArtisAruna, MD  omeprazole (PRILOSEC) 40 MG capsule Take 40 mg by mouth daily.   Yes [provider]  polyethylene glycol (MIRALAX) packet Take 17 g by mouth daily as needed for moderate constipation. 08/17/15  Yes Delfino LovettShah, Vipul, MD  vitamin C (VITAMIN C) 250 MG tablet Take 1 tablet (250 mg total) by mouth 2 (two) times daily. 01/07/18  Yes Enedina FinnerPatel, Sona, MD  feeding supplement, ENSURE ENLIVE, (ENSURE ENLIVE) LIQD Take 237 mLs by mouth 2 (two) times daily between meals. 01/04/18   Enedina FinnerPatel, Sona, MD  guaiFENesin-dextromethorphan [facility](ROBITUSSIN DM) 100-10 MG/5ML syrup Take 10 mLs by mouth every 6 (six) hours as needed for cough. 12/13/17   Gouru, Deanna ArtisAruna, MD  insulin aspart (NOVOLOG) 100 UNIT/ML injection Inject 0-5 Units into the skin at bedtime. CBG < 70: implement hypoglycemia protocol CBG 70 - 120: 0 units CBG 121 - 150: 0 units CBG 151 - 200: 0 units CBG 201 - 250: 2 units CBG 251 - 300: 3 units CBG 301 - 350: 4 units CBG 351 - 400: 5 units CBG > 400: call MD and obtain STAT lab verification Patient not taking: Reported on 2018-12-11 12/13/17   Ramonita LabGouru, Aruna, MD  insulin aspart (NOVOLOG) 100 UNIT/ML injection Inject 0-15 Units into the skin 3 (three) times daily with meals. CBG < 70: implement hypoglycemia protocol CBG 70 -  120: 0 units CBG 121 - 150: 2 units CBG 151 - 200: 3 units CBG 201 - 250: 5 units CBG 251 - 300: 8 units CBG 301 - 350: 11 units CBG 351 - 400: 15 units CBG > 400: call MD and obtain STAT lab verification Patient not taking: Reported on 2018-12-11 12/13/17   Ramonita LabGouru, Aruna, MD  nitroGLYCERIN (NITROSTAT) 0.4 MG SL tablet Place 0.4 mg under the tongue every 5 (five) minutes x 3 doses as needed for chest pain. If no relief call 911 or go to emergency room.    [provider]  oxyCODONE (OXY IR/ROXICODONE) 5 MG immediate release tablet Take 1 tablet (5 mg total) by mouth every 4 (four) hours as needed for moderate pain. 12/13/17   Ramonita LabGouru, Aruna, MD  PHYSICAL EXAMINATION:   VITAL SIGNS: Blood pressure 122/85, pulse 67, resp. rate 18, height 6' (1.829 m), weight 88 kg (194 lb), SpO2 100 %.  GENERAL:  77 y.o.-year-old patient lying in the bed with no acute distress. Critical appearing. EYES: Pupils equal, round, reactive to light and accommodation. No scleral icterus. Extraocular muscles intact.  HEENT: Head atraumatic, normocephalic. Oropharynx and nasopharynx clear.  NECK:  Supple, no jugular venous distention. No thyroid enlargement, no tenderness.  LUNGS: decreased breath sounds bilaterally, no wheezing, bilateral crepitation. positive use of accessory muscles of respiration. On BiPAP. CARDIOVASCULAR: S1, S2 normal. No murmurs, rubs, or gallops.  ABDOMEN: Soft, nontender, nondistended. Bowel sounds present. No organomegaly or mass.  EXTREMITIES: No pedal edema, cyanosis, or clubbing. Right upper extremity swelling. NEUROLOGIC: patient is alert but in acute distress, he moves his limbs spontaneously a little. PSYCHIATRIC: The patient is alert and oriented x 2.  SKIN: No obvious rash, lesion, or ulcer.   LABORATORY PANEL:   CBC Recent Labs  Lab 01/02/18 0417 2018-01-25 1115  WBC 8.8 11.1*  HGB 14.8 16.5  HCT 47.2 51.9  PLT 151 214  MCV 93.4 92.8  MCH 29.2 29.4  MCHC 31.3*  31.7*  RDW 17.6* 18.1*  LYMPHSABS  --  0.6*  MONOABS  --  0.9  EOSABS  --  0.0  BASOSABS  --  0.1   ------------------------------------------------------------------------------------------------------------------  Chemistries  Recent Labs  Lab 01/02/18 0417 01/04/18 0611 01/25/18 1252  NA 133* 136 134*  K 5.2* 4.7 4.9  CL 97* 99* 98*  CO2 26 26 28   GLUCOSE 242* 171* 98  BUN 51* 45* 50*  CREATININE 1.68* 1.68* 1.71*  CALCIUM 9.6 9.9 9.6  AST  --   --  27  ALT  --   --  22  ALKPHOS  --   --  107  BILITOT  --   --  1.9*   ------------------------------------------------------------------------------------------------------------------ estimated creatinine clearance is 41 mL/min (A) (by C-G formula based on SCr of 1.71 mg/dL (H)). ------------------------------------------------------------------------------------------------------------------ No results for input(s): TSH, T4TOTAL, T3FREE, THYROIDAB in the last 72 hours.  Invalid input(s): FREET3   Coagulation profile Recent Labs  Lab 01-25-18 1115  INR 1.24   ------------------------------------------------------------------------------------------------------------------- No results for input(s): DDIMER in the last 72 hours. -------------------------------------------------------------------------------------------------------------------  Cardiac Enzymes Recent Labs  Lab 25-Jan-2018 1115  TROPONINI 0.35*   ------------------------------------------------------------------------------------------------------------------ Invalid input(s): POCBNP  ---------------------------------------------------------------------------------------------------------------  Urinalysis    Component Value Date/Time   COLORURINE RED (A) 12/30/2017 2008   APPEARANCEUR CLOUDY (A) 12/30/2017 2008   APPEARANCEUR Hazy 03/19/2014 2110   LABSPEC 1.022 12/30/2017 2008   LABSPEC 1.018 03/19/2014 2110   PHURINE  12/30/2017 2008     TEST NOT REPORTED DUE TO COLOR INTERFERENCE OF URINE PIGMENT   GLUCOSEU (A) 12/30/2017 2008    TEST NOT REPORTED DUE TO COLOR INTERFERENCE OF URINE PIGMENT   GLUCOSEU Negative 03/19/2014 2110   HGBUR (A) 12/30/2017 2008    TEST NOT REPORTED DUE TO COLOR INTERFERENCE OF URINE PIGMENT   BILIRUBINUR (A) 12/30/2017 2008    TEST NOT REPORTED DUE TO COLOR INTERFERENCE OF URINE PIGMENT   BILIRUBINUR Negative 03/19/2014 2110   KETONESUR (A) 12/30/2017 2008    TEST NOT REPORTED DUE TO COLOR INTERFERENCE OF URINE PIGMENT   PROTEINUR (A) 12/30/2017 2008    TEST NOT REPORTED DUE TO COLOR INTERFERENCE OF URINE PIGMENT   NITRITE (A) 12/30/2017 2008    TEST NOT REPORTED DUE TO COLOR INTERFERENCE OF URINE PIGMENT  LEUKOCYTESUR (A) 12/30/2017 2008    TEST NOT REPORTED DUE TO COLOR INTERFERENCE OF URINE PIGMENT   LEUKOCYTESUR Negative 03/19/2014 2110     RADIOLOGY: Dg Chest Port 1 View  Result Date: 01/05/2018 CLINICAL DATA:  Dyspnea, hypoxia, asthma-COPD, CHF.  Current smoker. EXAM: PORTABLE CHEST 1 VIEW COMPARISON:  Chest x-ray of December 31, 2016 FINDINGS: The lungs remain hyperinflated. The interstitial markings remain increased with subtle areas of airspace opacity. There is pleural fluid blunting the costophrenic angles. The cardiac silhouette remains enlarged and the pulmonary vascularity engorged. There calcification in the wall of the aortic arch. IMPRESSION: COPD. CHF with pulmonary interstitial and early alveolar edema. One cannot exclude superimposed pneumonia in the appropriate clinical setting. There are small bilateral pleural effusions. Thoracic aortic atherosclerosis. Electronically Signed   By: David  [place] M.D.   On: 12/14/2017 11:44    EKG: Orders placed or performed during the hospital encounter of 12/13/2017  . ED EKG  . ED EKG  . EKG 12-Lead  . EKG 12-Lead    IMPRESSION AND PLAN:  * acute on chronic respiratory failure   Healthcare associated pneumonia    IV vancomycin  and cefepime.   Follow cultures.   Procalcitonin   BiPAP use, monitor in stepdown.   I spoke to Dr. Belia Heman on phone.    * severe emphysema.   IV steroid, inhaled steroid, nebulizer therapy.  * CHF- acute on chronic diastolic   IV Lasix, ejection fraction 50% per recent echo.   BiPAP.  * Right upper activity swelling   Ultrasound Doppler study to rule out DVT.  * hemoptysis   No anti-coagulation.  * A. Fib   Continue Cardizem and digoxin.  * CAD stage III   Monitor.  All the records are reviewed and case discussed with ED provider. Management plans discussed with the patient, family and they are in agreement.  CODE STATUS:full code Code Status History    Date Active Date Inactive Code Status Order ID Comments User Context   12/30/2017 21:13 01/07/2018 20:31 Full Code [id number]  Altamese Dilling, MD Inpatient   12/26/2017 12:16 12/29/2017 16:55 Full Code [id number]  Adrian Saran, MD Inpatient   12/07/2017 01:35 12/13/2017 18:58 Full Code [id number]  Oralia Manis, MD Inpatient   11/06/2017 19:46 11/09/2017 14:55 Full Code [id number]  Houston Siren, MD Inpatient   07/14/2017 00:55 07/21/2017 19:12 Full Code [id number]  Bobette Mo, MD Inpatient   12/26/2016 23:05 12/27/2016 20:34 Full Code [id number]  Auburn Bilberry, MD Inpatient   10/31/2015 09:34 11/03/2015 20:11 Full Code [id number]  Arnaldo Natal, MD Inpatient   09/16/2015 01:49 09/16/2015 18:22 Full Code [id number]  Arnaldo Natal, MD ED   08/24/2015 17:42 08/27/2015 16:10 Full Code [id number]  Auburn Bilberry, MD Inpatient   08/16/2015 03:55 08/17/2015 20:22 Full Code [id number]  Oralia Manis, MD Inpatient   07/04/2015 14:22 07/08/2015 16:04 Full Code [id number]  Altamese Dilling, MD Inpatient      I discussed with wife in the room and with intensivist. TOTAL TIME TAKING CARE OF THIS PATIENT:  50 critical care minutes.    Altamese Dilling M.D on 12/27/2017   Between 7am to 6pm - Pager - [phone]  After  6pm go to [email or web address] - Social research officer, government  [facility]  Office  [phone]  CC: Primary care physician; [facility]   Note: This dictation was prepared with Dragon dictation along with smaller phrase technology. Any transcriptional errors that result from this process  are unintentional.

## 2018-01-08 NOTE — Progress Notes (Addendum)
Pharmacy Antibiotic Note  Austin [name] is a 77 y.o. male admitted on 01/07/2018 with pneumonia.  Pharmacy has been consulted for vancomycin and cefepime dosing.  Plan: Continue vancomycin 1250mg  IV Q24hr for goal trough of 15-20. Will discontinue vancomycin if MRSA PCR is negative. Vancomycin discontinued secondary to MRSA being negative.   Initiate cefepime 2g IV Q12hr to start 6 hours after Zosyn dose.   Height: 6' (182.9 cm) Weight: 194 lb (88 kg) IBW/kg (Calculated) : 77.6  No data recorded.  Recent Labs  Lab 01/02/18 0417 01/04/18 0611 12/14/2017 1115 12/28/2017 1246 12/20/2017 1252  WBC 8.8  --  11.1*  --   --   CREATININE 1.68* 1.68*  --   --  1.71*  LATICACIDVEN  --   --  2.0* 1.7  --     Estimated Creatinine Clearance: 41 mL/min (A) (by C-G formula based on SCr of 1.71 mg/dL (H)).    Allergies  Allergen Reactions  . Contrast Media [Iodinated Diagnostic Agents] Nausea And Vomiting  . Other Other (See Comments)    Cannot have "high doses of anesthesia because of his lungs"    Antimicrobials this admission: Zosyn 1/29 x 1.  Vancomycin 1/29 >>  Cefepime 1/29 >>   Dose adjustments this admission: N/A  Microbiology results: 1/29 BCx: pending  1/29 Sputum Cx.:pending  1/29 MRSA PCR: negative   Thank you for allowing pharmacy to be a part of this patient's care.  Austin [name],Austin [name] 01/07/2018 4:34 PM

## 2018-01-08 NOTE — ED Notes (Signed)
Lab able to obtain another type and screen for testing.

## 2018-01-08 NOTE — Consult Note (Signed)
[facility]  PATIENT NAME: Austin [name]    MR#:  [id number]  DATE OF BIRTH:  [birth year]  DATE OF ADMISSION:  04/09/2018  PRIMARY CARE PHYSICIAN[facility]   REQUESTING/REFERRING PHYSICIAN: Williams  CHIEF COMPLAINT:   Chief Complaint  Patient presents with  . Respiratory Distress  . Hematemesis  hemoptysis  HISTORY OF PRESENT ILLNESS: Austin [name]  is a 77 y.o. male with a known history of asthma, A. Fib, CHF, COPD, is diabetes, DVT, hypertension, PE- and recurrent admissions in last few weeks.  He was discharged to rehabilitation after having a palliative care meeting  yesterday- and was sent to rehabilitation with CPAP usage. Today morning sent back to emergency room with complaint of cough and some blood in there, was noted to be hypoxic in ER started on BiPAP on. Patient with increased WOB and SOB using accessory muscles to breathe  x-ray chest showed some infiltrate/edema bilateral opacities  Patient also with hemoptysis  PAST MEDICAL HISTORY:   Past Medical History:  Diagnosis Date  . Acute lower UTI 07/16/2017  . Asthma   . Atrial fibrillation (HCC)   . CHF (congestive heart failure) (HCC)   . Chronic atrial fibrillation (HCC)   . 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 COPD (chronic obstructive pulmonary disease) (HCC)   . Diabetes mellitus without complication (HCC)   . DVT (deep venous thrombosis) (HCC)   . Gastric ulcer   . GERD (gastroesophageal reflux disease)   . Hypercholesteremia   . Hypertension   . Moderate to severe pulmonary hypertension (HCC)--per 2-D echo 07/14/2017 07/15/2017  . PE (pulmonary embolism)   . Sepsis due to undetermined organism with acute respiratory failure (HCC) 07/13/2017    PAST SURGICAL HISTORY:  Past Surgical History:  Procedure Laterality Date  . HERNIA REPAIR    . IR NEPHROSTOMY PLACEMENT LEFT  12/26/2017  . PERIPHERAL VASCULAR CATHETERIZATION N/A 11/03/2015   Procedure: IVC Filter Insertion;  Surgeon: Annice NeedyJason S Dew, MD;  Location: [facility] INVASIVE CV LAB;  Service: Cardiovascular;  Laterality: N/A;  . PERIPHERAL VASCULAR CATHETERIZATION N/A 07/24/2016   Procedure: IVC Filter Removal;  Surgeon: Annice NeedyJason S Dew, MD;  Location: [facility] INVASIVE CV LAB;  Service: Cardiovascular;  Laterality: N/A;  . PROSTATE SURGERY      SOCIAL HISTORY:  Social History   Tobacco Use  . Smoking status: Current Every Day Smoker    Packs/day: 1.00    Years: 45.00    Pack years: 45.00    Types: Cigarettes  . Smokeless tobacco: Never Used  Substance Use Topics  . Alcohol use: No    FAMILY HISTORY:  Family History  Problem Relation Age of Onset  . CAD Brother   . Congestive Heart Failure Brother     DRUG ALLERGIES:  Allergies  Allergen Reactions  . Contrast Media [Iodinated Diagnostic Agents] Nausea And Vomiting  . Other Other (See Comments)    Cannot have "high doses of anesthesia because of his lungs"    REVIEW OF SYSTEMS:   Patient is drowsy/unable to provide ROS  MEDICATIONS AT HOME:  Prior to Admission medications   Medication Sig Start Date End Date Taking? Authorizing Provider  albuterol (PROAIR HFA) 108 (90 Base) MCG/ACT inhaler Inhale 2 puffs into the lungs every 6 (six) hours as needed for wheezing or shortness of breath.    Yes [provider]  aspirin EC 81 MG tablet Take 81 mg by mouth daily.   Yes [provider]  budesonide-formoterol (SYMBICORT) 160-4.5 MCG/ACT inhaler Inhale 1 puff into the lungs 2 (two) times daily. 07/21/17  Yes Elliot Cousin, MD  digoxin (LANOXIN) 0.125 MG tablet Take 1 tablet (0.125 mg total) by mouth daily. 11/09/17  Yes Delfino Lovett, MD  diltiazem (CARDIZEM CD) 180 MG 24 hr capsule Take 1 capsule (180 mg total) by mouth daily. 01/05/18  Yes Enedina Finner, MD  furosemide (LASIX) 40 MG tablet Take 1 tablet (40 mg total) by mouth 2 (two) times daily. 01/04/18  Yes Enedina Finner, MD  insulin glargine (LANTUS) 100  UNIT/ML injection Inject 0.1 mLs (10 Units total) into the skin daily. 12/13/17  Yes Gouru, Aruna, MD  insulin lispro (HUMALOG) 100 UNIT/ML injection Inject 0-15 Units into the skin 3 (three) times daily before meals.   Yes [provider]  ipratropium-albuterol (DUONEB) 0.5-2.5 (3) MG/3ML SOLN Take 3 mLs by nebulization every 6 (six) hours. 12/13/17  Yes Gouru, Deanna Artis, MD  lovastatin (MEVACOR) 20 MG tablet Take 20 mg by mouth at bedtime. Pt should take with food.    Yes [provider]  mupirocin cream (BACTROBAN) 2 % Apply topically 2 (two) times daily. 12/13/17  Yes Gouru, Deanna Artis, MD  omeprazole (PRILOSEC) 40 MG capsule Take 40 mg by mouth daily.   Yes [provider]  polyethylene glycol (MIRALAX) packet Take 17 g by mouth daily as needed for moderate constipation. 08/17/15  Yes Delfino Lovett, MD  vitamin C (VITAMIN C) 250 MG tablet Take 1 tablet (250 mg total) by mouth 2 (two) times daily. 01/07/18  Yes Enedina Finner, MD  feeding supplement, ENSURE ENLIVE, (ENSURE ENLIVE) LIQD Take 237 mLs by mouth 2 (two) times daily between meals. 01/04/18   Enedina Finner, MD  guaiFENesin-dextromethorphan [facility] DM) 100-10 MG/5ML syrup Take 10 mLs by mouth every 6 (six) hours as needed for cough. 12/13/17   Gouru, Deanna Artis, MD  insulin aspart (NOVOLOG) 100 UNIT/ML injection Inject 0-5 Units into the skin at bedtime. CBG < 70: implement hypoglycemia protocol CBG 70 - 120: 0 units CBG 121 - 150: 0 units CBG 151 - 200: 0 units CBG 201 - 250: 2 units CBG 251 - 300: 3 units CBG 301 - 350: 4 units CBG 351 - 400: 5 units CBG > 400: call MD and obtain STAT lab verification Patient not taking: Reported on 01/26/18 12/13/17   Ramonita Lab, MD  insulin aspart (NOVOLOG) 100 UNIT/ML injection Inject 0-15 Units into the skin 3 (three) times daily with meals. CBG < 70: implement hypoglycemia protocol CBG 70 - 120: 0 units CBG 121 - 150: 2 units CBG 151 - 200: 3 units CBG 201 - 250: 5 units CBG 251 - 300: 8  units CBG 301 - 350: 11 units CBG 351 - 400: 15 units CBG > 400: call MD and obtain STAT lab verification Patient not taking: Reported on 2018-01-26 12/13/17   Ramonita Lab, MD  nitroGLYCERIN (NITROSTAT) 0.4 MG SL tablet Place 0.4 mg under the tongue every 5 (five) minutes x 3 doses as needed for chest pain. If no relief call 911 or go to emergency room.    [provider]  oxyCODONE (OXY IR/ROXICODONE) 5 MG immediate release tablet Take 1 tablet (5 mg total) by mouth every 4 (four) hours as needed for moderate pain. 12/13/17   Ramonita Lab, MD      PHYSICAL EXAMINATION:   VITAL SIGNS: Blood pressure (!) 123/98, pulse 73, temperature (!) 96.3 F (35.7 C), temperature source Axillary, resp. rate (!) 29, height  5\' 6"  (1.676 m), weight 190 lb 4.1 oz (86.3 kg), SpO2 95 %.  GENERAL:  77 y.o.-year-old patient lying in the bed with + acute distress. Critical appearing. EYES: Pupils equal, round, reactive to light and accommodation. No scleral icterus. Extraocular muscles intact.  HEENT: Head atraumatic, normocephalic. Oropharynx and nasopharynx clear.  NECK:  Supple, no jugular venous distention. No thyroid enlargement, no tenderness.  LUNGS: decreased breath sounds bilaterally, no wheezing, bilateral crepitation. positive use of accessory muscles of respiration. On BiPAP. +crackles CARDIOVASCULAR: S1, S2 normal. No murmurs, rubs, or gallops.  ABDOMEN: Soft, nontender, nondistended. Bowel sounds present. No organomegaly or mass.  EXTREMITIES: No pedal edema, cyanosis, or clubbing. Right upper extremity swelling. NEUROLOGIC: patient is alert but in acute distress, he moves his limbs spontaneously a little. PSYCHIATRIC: The patient is alert and oriented x 2.  SKIN: No obvious rash, lesion, or ulcer.   LABORATORY PANEL:   CBC Recent Labs  Lab 01/02/18 0417 2018-01-12 1115  WBC 8.8 11.1*  HGB 14.8 16.5  HCT 47.2 51.9  PLT 151 214  MCV 93.4 92.8  MCH 29.2 29.4  MCHC 31.3* 31.7*  RDW  17.6* 18.1*  LYMPHSABS  --  0.6*  MONOABS  --  0.9  EOSABS  --  0.0  BASOSABS  --  0.1   ------------------------------------------------------------------------------------------------------------------  Chemistries  Recent Labs  Lab 01/02/18 0417 01/04/18 0611 12-Jan-2018 1252  NA 133* 136 134*  K 5.2* 4.7 4.9  CL 97* 99* 98*  CO2 26 26 28   GLUCOSE 242* 171* 98  BUN 51* 45* 50*  CREATININE 1.68* 1.68* 1.71*  CALCIUM 9.6 9.9 9.6  AST  --   --  27  ALT  --   --  22  ALKPHOS  --   --  107  BILITOT  --   --  1.9*   ------------------------------------------------------------------------------------------------------------------   Coagulation profile Recent Labs  Lab 01-12-18 1115  INR 1.24   ------------------------------------------------------------------------------------------------------------------- No results for input(s): DDIMER in the last 72 hours. -------------------------------------------------------------------------------------------------------------------  Cardiac Enzymes Recent Labs  Lab January 12, 2018 1115  TROPONINI 0.35*   ------------------------------------------------------------------------------------------------------------------ Invalid input(s): POCBNP  ---------------------------------------------------------------------------------------------------------------  Urinalysis    Component Value Date/Time   COLORURINE RED (A) 12/30/2017 2008   APPEARANCEUR CLOUDY (A) 12/30/2017 2008   APPEARANCEUR Hazy 03/19/2014 2110   LABSPEC 1.022 12/30/2017 2008   LABSPEC 1.018 03/19/2014 2110   PHURINE  12/30/2017 2008    TEST NOT REPORTED DUE TO COLOR INTERFERENCE OF URINE PIGMENT   GLUCOSEU (A) 12/30/2017 2008    TEST NOT REPORTED DUE TO COLOR INTERFERENCE OF URINE PIGMENT   GLUCOSEU Negative 03/19/2014 2110   HGBUR (A) 12/30/2017 2008    TEST NOT REPORTED DUE TO COLOR INTERFERENCE OF URINE PIGMENT   BILIRUBINUR (A) 12/30/2017 2008    TEST NOT  REPORTED DUE TO COLOR INTERFERENCE OF URINE PIGMENT   BILIRUBINUR Negative 03/19/2014 2110   KETONESUR (A) 12/30/2017 2008    TEST NOT REPORTED DUE TO COLOR INTERFERENCE OF URINE PIGMENT   PROTEINUR (A) 12/30/2017 2008    TEST NOT REPORTED DUE TO COLOR INTERFERENCE OF URINE PIGMENT   NITRITE (A) 12/30/2017 2008    TEST NOT REPORTED DUE TO COLOR INTERFERENCE OF URINE PIGMENT   LEUKOCYTESUR (A) 12/30/2017 2008    TEST NOT REPORTED DUE TO COLOR INTERFERENCE OF URINE PIGMENT   LEUKOCYTESUR Negative 03/19/2014 2110     RADIOLOGY: Dg Chest Port 1 View  Result Date: 01/12/18 CLINICAL DATA:  Dyspnea, hypoxia, asthma-COPD, CHF.  Current  smoker. EXAM: PORTABLE CHEST 1 VIEW COMPARISON:  Chest x-ray of December 31, 2016 FINDINGS: The lungs remain hyperinflated. The interstitial markings remain increased with subtle areas of airspace opacity. There is pleural fluid blunting the costophrenic angles. The cardiac silhouette remains enlarged and the pulmonary vascularity engorged. There calcification in the wall of the aortic arch. IMPRESSION: COPD. CHF with pulmonary interstitial and early alveolar edema. One cannot exclude superimposed pneumonia in the appropriate clinical setting. There are small bilateral pleural effusions. Thoracic aortic atherosclerosis. Electronically Signed   By: David  [place] M.D.   On: 12/31/2017 11:44  I have Independently reviewed images of    on 12/30/2017 Interpretation:b/l opacities, pneumonia vs edema  IMPRESSION AND PLAN:  77 yo AAM admitted for increased WOB and SOB with resp failure With hemoptysis, severe emphysema with COPD exacerbation with hemoptysis, signs of CHF exacerbation High risk for intubation and cardiac arrest  1.acute on chronic respiratory failure   Will treat as Healthcare associated pneumonia   IV vancomycin and cefepime.   Follow cultures.   Procalcitonin   BiPAP use,decrease fio2     2.severe emphysema/COPD exacerbation   IV steroid, inhaled  steroid, nebulizer therapy.  3.CHF- acute on chronic diastolic   IV Lasix, ejection fraction 50% per recent echo.   BiPAP.  4.Right upper activity swelling   Ultrasound Doppler study to rule out DVT.  5. hemoptysis   No anti-coagulation. COPD exacerbation, CHF exacerbation Avoid NSAIDS for now  6.A. Fib   Continue Cardizem and digoxin.   Critical Care Time devoted to patient care services described in this note is 65 minutes.   Overall, patient is critically ill, prognosis is guarded.  Patient with Multiorgan failure and at high risk for cardiac arrest and death.    Lucie Leather, M.D.  [facility]  Medical Director [facility] Medical Director [facility] Cardio-Pulmonary Department

## 2018-01-08 NOTE — Progress Notes (Signed)
Pt taken off bipap and placed on 55% Venti mask, tolerating well at this time, respiratory rate 24/min, sats 98%, will continue to monitor

## 2018-01-08 NOTE — ED Notes (Signed)
Lab has called to inform RN that 3rd type and screen collection has hemolysed, RN recollected.

## 2018-01-08 NOTE — ED Notes (Signed)
Pt and family updated on room assignment.

## 2018-01-08 NOTE — Procedures (Signed)
Central Venous Catheter Placement:TRIPLE LUMEN Indication: Patient receiving vesicant or irritant drug.; Patient receiving intravenous therapy for longer than 5 days.; Patient has limited or no vascular access.   Consent:emergent    Hand washing performed prior to starting the procedure.   Procedure:   An active timeout was performed and correct patient, name, & ID confirmed.   Patient was positioned correctly for central venous access.  Patient was prepped using strict sterile technique including chlorohexadine preps, sterile drape, sterile gown and sterile gloves.    The area was prepped, draped and anesthetized in the usual sterile manner. Patient comfort was obtained.    A triple lumen catheter was placed in RT  Internal Jugular Vein There was good blood return, catheter caps were placed on lumens, catheter flushed easily, the line was secured and a sterile dressing and BIO-PATCH applied.   Ultrasound was used to visualize vasculature and guidance of needle.   Number of Attempts: 1 Complications:none Estimated Blood Loss: none Chest Radiograph indicated and ordered.  Operator: Rhyli Depaula.   Eufemia Prindle David Tiondra Fang, M.D.  [facility]  Medical Director [facility] Medical Director [facility] Cardio-Pulmonary Department     

## 2018-01-08 NOTE — Procedures (Signed)
Endotracheal Intubation: Patient required placement of an artificial airway secondary to Respiratory Failure  Consent: Emergent.   Hand washing performed prior to starting the procedure.   Medications administered for sedation prior to procedure:  Midazolam 4 mg IV,  Vecuronium 10 mg IV, Fentanyl  200 mcg IV.    A time out procedure was called and correct patient, name, & ID confirmed. Needed supplies and equipment were assembled and checked to include ETT, 10 ml syringe, Glidescope, Mac and Miller blades, suction, oxygen and bag mask valve, end tidal CO2 monitor.   Patient was positioned to align the mouth and pharynx to facilitate visualization of the glottis.   Heart rate, SpO2 and blood pressure was continuously monitored during the procedure. Pre-oxygenation was conducted prior to intubation and endotracheal tube was placed through the vocal cords into the trachea.     The artificial airway was placed under direct visualization via glidescope route using a 8.0 ETT on the first attempt.  ETT was secured at 23 cm mark.  Placement was confirmed by auscuitation of lungs with good breath sounds bilaterally and no stomach sounds.  Condensation was noted on endotracheal tube.   Pulse ox 98%.  CO2 detector in place with appropriate color change.   Complications: None .   Operator: Ezekial Arns.   Chest radiograph ordered and pending.   Comments: OGT NOT placed  Lateka Rady Patricia Pesa, M.D.  [facility]  Medical Director [place] Director [facility] Cardio-Pulmonary Department

## 2018-01-08 NOTE — ED Notes (Signed)
Date and time results received: 01/04/2018 1237   Test: lactic acid Critical Value: 2.0  Name of Provider Notified: Dr. Mayford KnifeWilliams

## 2018-01-08 NOTE — Progress Notes (Signed)
ANTIBIOTIC CONSULT NOTE - INITIAL  Pharmacy Consult for Vancomycin  Indication: pneumonia  Allergies  Allergen Reactions  . Contrast Media [Iodinated Diagnostic Agents] Nausea And Vomiting  . Other Other (See Comments)    Cannot have "high doses of anesthesia because of his lungs"    Patient Measurements: Height: 6' (182.9 cm) Weight: 194 lb (88 kg) IBW/kg (Calculated) : 77.6 Adjusted Body Weight: 81.6 kg   Vital Signs: BP: 119/88 (01/29 1530) Pulse Rate: 66 (01/29 1530) Intake/Output from previous day: No intake/output data recorded. Intake/Output from this shift: Total I/O In: 1050 [IV Piggyback:1050] Out: 200 [Urine:200]  Labs: Recent Labs    01/05/2018 1115 12/14/2017 1252  WBC 11.1*  --   HGB 16.5  --   PLT 214  --   CREATININE  --  1.71*   Estimated Creatinine Clearance: 41 mL/min (A) (by C-G formula based on SCr of 1.71 mg/dL (H)). No results for input(s): VANCOTROUGH, VANCOPEAK, VANCORANDOM, GENTTROUGH, GENTPEAK, GENTRANDOM, TOBRATROUGH, TOBRAPEAK, TOBRARND, AMIKACINPEAK, AMIKACINTROU, AMIKACIN in the last 72 hours.   Microbiology: Recent Results (from the past 720 hour(s))  Culture, blood (routine x 2)     Status: None   Collection Time: 12/26/17  9:55 AM  Result Value Ref Range Status   Specimen Description BLOOD LEFT FA  Final   Special Requests   Final    BOTTLES DRAWN AEROBIC AND ANAEROBIC Blood Culture results may not be optimal due to an inadequate volume of blood received in culture bottles   Culture   Final    NO GROWTH 5 DAYS Performed at [facility], [address] [place], [place] [address]    Report Status 12/31/2017 FINAL  Final  Culture, blood (routine x 2)     Status: None   Collection Time: 12/26/17  9:55 AM  Result Value Ref Range Status   Specimen Description BLOOD LEFT UPPER ARM  Final   Special Requests   Final    BOTTLES DRAWN AEROBIC AND ANAEROBIC Blood Culture adequate volume   Culture   Final    NO GROWTH 5  DAYS Performed at [facility], [address] [place], [place] [address]    Report Status 12/31/2017 FINAL  Final  Urine culture     Status: None   Collection Time: 12/26/17 11:20 AM  Result Value Ref Range Status   Specimen Description   Final    URINE, RANDOM Performed at [facility], [address] [place], [place] [address]    Special Requests   Final    NONE Performed at [facility], [address] [place], [place] [address]    Culture   Final    NO GROWTH Performed at [facility], [address] [place], [place] [address]    Report Status 12/27/2017 FINAL  Final  MRSA PCR Screening     Status: None   Collection Time: 12/26/17 12:18 PM  Result Value Ref Range Status   MRSA by PCR NEGATIVE NEGATIVE Final    Comment:        The GeneXpert MRSA Assay (FDA approved for NASAL specimens only), is one component of a comprehensive MRSA colonization surveillance program. It is not intended to diagnose MRSA infection nor to guide or monitor treatment for MRSA infections. Performed at [facility], [address] [place], [place] [address]   MRSA PCR Screening     Status: None   Collection Time: 12/30/17  9:23 PM  Result Value Ref Range Status  MRSA by PCR NEGATIVE NEGATIVE Final    Comment:        The GeneXpert MRSA Assay (FDA approved for NASAL specimens only), is one component of a comprehensive MRSA colonization surveillance program. It is not intended to diagnose MRSA infection nor to guide or monitor treatment for MRSA infections. Performed at [facility], [address], [place], [place] [address]     Medical History: Past Medical History:  Diagnosis Date  . Acute lower UTI 07/16/2017  . Asthma   . Atrial fibrillation (HCC)   . CHF (congestive heart failure) (HCC)   . Chronic atrial fibrillation (HCC)   . 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 COPD (chronic  obstructive pulmonary disease) (HCC)   . Diabetes mellitus without complication (HCC)   . DVT (deep venous thrombosis) (HCC)   . Gastric ulcer   . GERD (gastroesophageal reflux disease)   . Hypercholesteremia   . Hypertension   . Moderate to severe pulmonary hypertension (HCC)--per 2-D echo 07/14/2017 07/15/2017  . PE (pulmonary embolism)   . Sepsis due to undetermined organism with acute respiratory failure (HCC) 07/13/2017    Medications:   (Not in a hospital admission) Scheduled:  . budesonide (PULMICORT) nebulizer solution  0.25 mg Nebulization BID  . furosemide  20 mg Intravenous Q12H  . insulin aspart  0-9 Units Subcutaneous TID WC  . methylPREDNISolone (SOLU-MEDROL) injection  60 mg Intravenous Q6H  . tiotropium  18 mcg Inhalation Daily   Assessment: Pharmacy consulted to dose and monitor Vancomycin and Zosyn in this 77 year old male for possible pneumonia.  Goal of Therapy:  Vancomycin trough level 15-20 mcg/ml  Plan:  Vancomycin: Patient received Vancomycin 1000 mg IV x 1. Will give an additional 500 mg of Vancomycin to = 1500 mg. Will start Vancomycin 1250 mg IV q24 hours.   Zosyn: Will start Zosyn 3.375 g IV q8 hours.   [place],Danille Oppedisano D 02-01-18,4:08 PM

## 2018-01-08 NOTE — ED Provider Notes (Addendum)
[facility] Emergency Department Provider Note       Time seen: ----------------------------------------- 11:18 AM on 12/14/2017 ----------------------------------------- Level V caveat: History/ROS limited by respiratory distress  I have reviewed the triage vital signs and the nursing notes.  HISTORY   Chief Complaint Respiratory Distress and Hematemesis    HPI Austin [name] is a 77 y.o. male with a history of asthma, atrial fibrillation, CHF, COPD, diabetes, DVT, hypercholesterolemia, hypertension who presents to the ED for respiratory distress and spitting up bright blood.  Patient comes from [facility] after the staff reported he has had no respiratory distress.  He has been bringing up bloody oral secretions and they are not sure if he is vomiting blood.  Blood is bright red.  EMS reported that he had appeared to be volume overloaded and it looks edematous.  On CPAP oxygen saturations were in the mid 80s.  He was recently discharged from the hospital after acute respiratory failure.  Past Medical History:  Diagnosis Date  . Acute lower UTI 07/16/2017  . Asthma   . Atrial fibrillation (HCC)   . CHF (congestive heart failure) (HCC)   . Chronic atrial fibrillation (HCC)   . 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 COPD (chronic obstructive pulmonary disease) (HCC)   . Diabetes mellitus without complication (HCC)   . DVT (deep venous thrombosis) (HCC)   . Gastric ulcer   . GERD (gastroesophageal reflux disease)   . Hypercholesteremia   . Hypertension   . Moderate to severe pulmonary hypertension (HCC)--per 2-D echo 07/14/2017 07/15/2017  . PE (pulmonary embolism)   . Sepsis due to undetermined organism with acute respiratory failure (HCC) 07/13/2017    Patient Active Problem List   Diagnosis Date Noted  . Atrial fibrillation with RVR (HCC) 12/30/2017  . Ileus (HCC)   . Scrotal hernia   . Obstructive uropathy 12/26/2017   . Hydronephrosis   . Atrial fibrillation (HCC) 11/08/2017  . Atrial fibrillation and flutter (HCC) 11/06/2017  . 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 Acute on chronic diastolic CHF (congestive heart failure) (HCC)   . Chronic atrial fibrillation (HCC)   . Acute lower UTI 07/16/2017  . Sepsis (HCC)   . Acute respiratory failure with hypoxia (HCC) 07/15/2017  . Moderate to severe pulmonary hypertension (HCC)--per 2-D echo 07/14/2017 07/15/2017  . Mass of lung 07/15/2017  . Ascending aortic aneurysm (HCC) 07/14/2017  . Elevated bilirubin 07/14/2017  . Kidney stone 07/14/2017  . Lactic acidosis 07/14/2017  . Sepsis due to undetermined organism with acute respiratory failure (HCC) 07/13/2017  . COPD exacerbation (HCC) 12/26/2016  . Hypoxia 11/03/2015  . COPD (chronic obstructive pulmonary disease) (HCC) 11/03/2015  . DVT of popliteal vein (HCC) 11/03/2015  . Chronic systolic CHF (congestive heart failure) (HCC) 11/03/2015  . Essential hypertension 11/03/2015  . Diabetes mellitus (HCC) 11/03/2015  . Continuous tobacco abuse 11/03/2015  . Chronic pulmonary embolism (HCC) 11/03/2015  . 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 Hemoptysis 10/31/2015  . Elevated troponin 09/16/2015  . SOB (shortness of breath) 08/24/2015  . Pulmonary embolism (HCC) 08/24/2015  . 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 Type 2 diabetes mellitus (HCC) 08/16/2015  . HLD (hyperlipidemia) 08/16/2015  . GERD (gastroesophageal reflux disease) 08/16/2015  . Chest pain 08/16/2015  . Constipation 08/16/2015  . HTN (hypertension) 07/21/2015  . COPD (chronic obstructive pulmonary disease) with chronic bronchitis (HCC) 07/21/2015  .  CHF (NYHA class IV, ACC/AHA stage D) (HCC) 07/04/2015  . CHF (congestive heart failure) (HCC) 07/04/2015    Past Surgical History:  Procedure Laterality Date  . HERNIA REPAIR    . IR  NEPHROSTOMY PLACEMENT LEFT  12/26/2017  . PERIPHERAL VASCULAR CATHETERIZATION N/A 11/03/2015   Procedure: IVC Filter Insertion;  Surgeon: Annice NeedyJason S Dew, MD;  Location: [facility] INVASIVE CV LAB;  Service: Cardiovascular;  Laterality: N/A;  . PERIPHERAL VASCULAR CATHETERIZATION N/A 07/24/2016   Procedure: IVC Filter Removal;  Surgeon: Annice NeedyJason S Dew, MD;  Location: [facility] INVASIVE CV LAB;  Service: Cardiovascular;  Laterality: N/A;  . PROSTATE SURGERY      Allergies Contrast media [iodinated diagnostic agents] and Other  Social History Social History   Tobacco Use  . Smoking status: Current Every Day Smoker    Packs/day: 1.00    Years: 45.00    Pack years: 45.00    Types: Cigarettes  . Smokeless tobacco: Never Used  Substance Use Topics  . Alcohol use: No  . Drug use: No    Review of Systems Constitutional: Negative for fever. Cardiovascular: Negative for chest pain. Respiratory: Positive for respiratory distress Gastrointestinal: Positive for likely hematemesis Skin: Negative for rash. Neurological: Positive for weakness  All systems negative/normal/unremarkable or unknown except as stated in the HPI  ____________________________________________   PHYSICAL EXAM:  VITAL SIGNS: ED Triage Vitals  Enc Vitals Group     BP      Pulse      Resp      Temp      Temp src      SpO2      Weight      Height      Head Circumference      Peak Flow      Pain Score      Pain Loc      Pain Edu?      Excl. in GC?     Constitutional: Alert, ill-appearing, moderate distress Eyes: Conjunctivae are normal. Normal extraocular movements. ENT   Head: Normocephalic and atraumatic.   Nose: No congestion/rhinnorhea.   Mouth/Throat: Mucous membranes are moist, copious bright red blood is appreciated diffusely in the oral cavity   Neck: No stridor. Cardiovascular: Rapid rate, regular rhythm. No murmurs, rubs, or gallops. Respiratory: Tachypnea with rales  bilaterally Gastrointestinal: Soft and nontender. Normal bowel sounds Musculoskeletal: Generalized widespread edema is noted Neurologic:  No gross focal neurologic deficits are appreciated.  Generalized weakness is noted Skin:  Skin is warm, mottled appearance to his skin ____________________________________________  EKG: Interpreted by me.  Sinus rhythm the rate 81 bpm, PVC, short PR interval, right bundle branch block, ST depressions are noted  ____________________________________________  ED COURSE:  As part of my medical decision making, I reviewed the following data within the electronic MEDICAL RECORD NUMBER History obtained from family if available, nursing notes, old chart and ekg, as well as notes from prior ED visits. Patient presented for respiratory distress, we will assess with labs and imaging as indicated at this time.  Patient was placed on BiPAP and may require intubation Clinical Course as of Jan 08 1309  Tue Jan 08, 2018  1200 And appears to be improving on BiPAP  [JW]    Clinical Course User Index [JW] Emily FilbertWilliams, Jonathan E, MD   Procedures ____________________________________________   LABS (pertinent positives/negatives)  Labs Reviewed  CBC WITH DIFFERENTIAL/PLATELET - Abnormal; Notable for the following components:      Result Value   WBC 11.1 (*)  MCHC 31.7 (*)    RDW 18.1 (*)    Neutro Abs 9.5 (*)    Lymphs Abs 0.6 (*)    All other components within normal limits  TROPONIN I - Abnormal; Notable for the following components:   Troponin I 0.35 (*)    All other components within normal limits  BLOOD GAS, ARTERIAL - Abnormal; Notable for the following components:   pCO2 arterial 51 (*)    pO2, Arterial <31.0 (*)    Bicarbonate 29.5 (*)    Acid-Base Excess 2.8 (*)    All other components within normal limits  LACTIC ACID, PLASMA - Abnormal; Notable for the following components:   Lactic Acid, Venous 2.0 (*)    All other components within normal limits   PROTIME-INR - Abnormal; Notable for the following components:   Prothrombin Time 15.5 (*)    All other components within normal limits  COMPREHENSIVE METABOLIC PANEL - Abnormal; Notable for the following components:   Sodium 134 (*)    Chloride 98 (*)    BUN 50 (*)    Creatinine, Ser 1.71 (*)    Total Protein 5.9 (*)    Albumin 3.0 (*)    Total Bilirubin 1.9 (*)    GFR calc non Af Amer 37 (*)    GFR calc Af Amer 43 (*)    All other components within normal limits  CULTURE, BLOOD (ROUTINE X 2)  CULTURE, BLOOD (ROUTINE X 2)  DIGOXIN LEVEL  LACTIC ACID, PLASMA  BRAIN NATRIURETIC PEPTIDE  TYPE AND SCREEN  TYPE AND SCREEN  TYPE AND SCREEN  TYPE AND SCREEN   CRITICAL CARE Performed by: Emily Filbert  Total critical care time: 30 minutes  Critical care time was exclusive of separately billable procedures and treating other patients.  Critical care was necessary to treat or prevent imminent or life-threatening deterioration.  Critical care was time spent personally by me on the following activities: development of treatment plan with patient and/or surrogate as well as nursing, discussions with consultants, evaluation of patient's response to treatment, examination of patient, obtaining history from patient or surrogate, ordering and performing treatments and interventions, ordering and review of laboratory studies, ordering and review of radiographic studies, pulse oximetry and re-evaluation of patient's condition.  RADIOLOGY Images were viewed by me  Chest x-ray IMPRESSION: COPD. CHF with pulmonary interstitial and early alveolar edema. One cannot exclude superimposed pneumonia in the appropriate clinical setting. There are small bilateral pleural effusions.  Thoracic aortic atherosclerosis. ____________________________________________  DIFFERENTIAL DIAGNOSIS   COPD, CHF, pneumonia, aspiration, GI bleed, sepsis  FINAL ASSESSMENT AND PLAN  Acute respiratory  failure with hypoxia, bleeding from the oropharynx, CHF   Plan: Patient had presented for acute respiratory failure and was immediately placed on BiPAP. Patient's labs revealed mild lactic acid elevation and mildly elevated troponin which is chronic. Patient's imaging looks more like CHF than any other etiology although we have given broad-spectrum antibiotics to cover for pneumonia.  It is always possible he has aspirated although the patient states he does not choke and has not vomited.  He is markedly improved on BiPAP.  He was also given IV Lasix.  I have discussed with the hospitalist for admission to the ICU.   Emily Filbert, MD   Note: This note was generated in part or whole with voice recognition software. Voice recognition is usually quite accurate but there are transcription errors that can and very often do occur. I apologize for any typographical errors that  were not detected and corrected.     Emily Filbert, MD 03-Feb-2018 1311    Emily Filbert, MD 2018/02/03 [phone]

## 2018-01-08 NOTE — Progress Notes (Signed)
I am asked to admit the pt, he is on Bipap. Pt and his wife want him to be a full code.  I have seen the pt, spoke to Dr. Belia Hemankasa As ICU is running full, he suggest to monitor in ER for 1-2 Hrs, try to wean him off the bipap, if successful- we may admit to floor. If still  Need bipap, Dr.kasa will try to make room for him in ICU. Spoke to Dr. Mayford KnifeWilliams- he is aware.

## 2018-01-08 NOTE — ED Triage Notes (Signed)
[facility] after staff reported pt had noted resp. Distress and began vomiting bloody emesis. Blood in bright red in color and per EMS pt seemed to be spitting blood up. Lungs are reported to sound "full of fluid" per EMS.  Pt is responding to MD questions at this time but reports the CPAP is not helping.   EMS placed pt on CPAP after reporting oxygen saturation of 80%. Pt was recently discharged from hospital with acute resp failure and hx of CHF as well as a documented Afib RVR event on 12/30/17.

## 2018-01-08 NOTE — Progress Notes (Signed)
Pharmacy Antibiotic Note  Austin [name] is a 77 y.o. male admitted on 12/27/2017 with pneumonia.  Pharmacy has been consulted for zosyn dosing.  Plan: Zosyn 3.375g IV q8h (4 hour infusion).  Height: 5\' 6"  (167.6 cm) Weight: 190 lb 4.1 oz (86.3 kg) IBW/kg (Calculated) : 63.8  Temp (24hrs), Avg:96.9 F (36.1 C), Min:96.3 F (35.7 C), Max:97.5 F (36.4 C)  Recent Labs  Lab 01/02/18 0417 01/04/18 0611 01/07/2018 1115 01/04/2018 1246 01/08/2018 1252 01/08/18 2133  WBC 8.8  --  11.1*  --   --  10.5  CREATININE 1.68* 1.68*  --   --  1.71* 1.57*  LATICACIDVEN  --   --  2.0* 1.7  --  1.2    Estimated Creatinine Clearance: 41.9 mL/min (A) (by C-G formula based on SCr of 1.57 mg/dL (H)).    Allergies  Allergen Reactions  . Contrast Media [Iodinated Diagnostic Agents] Nausea And Vomiting  . Other Other (See Comments)    Cannot have "high doses of anesthesia because of his lungs"    Thank you for allowing pharmacy to be a part of this patient's care.  Thomasene Rippleavid Cayce Paschal, PharmD, BCPS Clinical Pharmacist 01/08/2018

## 2018-01-08 NOTE — Progress Notes (Signed)
Pt on 30%B I-PAP.  Pt c/ol needing to "spit".  Pt wth multiple episodes of hemoptysis.  Jerolyn Centerukolv, NP at bedside.  Pt changed to [place] per Luci Bankukov, NP.  RAZesp therapy notified.

## 2018-01-08 NOTE — ED Notes (Signed)
Lab tech reports he was unsuccessful in obtaining type and screen blood tube and reports to RN that he has called for another lab tech to assist. Pt updated.

## 2018-01-08 NOTE — ED Notes (Signed)
RN informed by lab that 2nd recollect of type and screen has hemolyzed. RN has redrawn blood tube.

## 2018-01-08 NOTE — ED Notes (Signed)
LAb has informed RN type and screen collection has hemolyzed. Blood has been redrawn and sent.

## 2018-01-08 NOTE — Progress Notes (Signed)
After further assessment, patient has massive hemetemasis with increased WOB and resp distress, spitting up in biPAP. Wife at bedside updated  And notified, the patient was awake and alert and did consent to intubation, patient was also explained about failure to wean from vent as well as going into cardiac arrest.  Patient has agreed and wife witnessed patient wishes to follow thorough with intubation and assess GIB but if patient does NOT wean from vent,  he would NOT want tracheostomy and patient does NOT want chest compressions or shock therapy. Wife,  ICU nurses and RT witnessed patients wishes.  After this discussion patient was successfully intubated and sedated.  Will place DNR orders, obtain GI consult and provide aggressive medical up until cardiac arrest if this happens.   Family are satisfied with Plan of action and management. All questions answered  Lucie LeatherKurian David Autum Benfer, M.D.  [facility]  Medical Director [facility] Medical Director [facility]

## 2018-01-09 ENCOUNTER — Inpatient Hospital Stay: Payer: Medicare Other

## 2018-01-09 DIAGNOSIS — R69 Illness, unspecified: Secondary | ICD-10-CM

## 2018-01-09 LAB — CBC
HCT: 48.6 % (ref 40.0–52.0)
Hemoglobin: 15.1 g/dL (ref 13.0–18.0)
MCH: 29.3 pg (ref 26.0–34.0)
MCHC: 31 g/dL — ABNORMAL LOW (ref 32.0–36.0)
MCV: 94.6 fL (ref 80.0–100.0)
PLATELETS: 173 10*3/uL (ref 150–440)
RBC: 5.13 MIL/uL (ref 4.40–5.90)
RDW: 18.2 % — AB (ref 11.5–14.5)
WBC: 11.1 10*3/uL — AB (ref 3.8–10.6)

## 2018-01-09 LAB — BASIC METABOLIC PANEL
ANION GAP: 9 (ref 5–15)
Anion gap: 9 (ref 5–15)
BUN: 59 mg/dL — AB (ref 6–20)
BUN: 62 mg/dL — ABNORMAL HIGH (ref 6–20)
CALCIUM: 9.8 mg/dL (ref 8.9–10.3)
CHLORIDE: 99 mmol/L — AB (ref 101–111)
CO2: 26 mmol/L (ref 22–32)
CO2: 26 mmol/L (ref 22–32)
CREATININE: 2.23 mg/dL — AB (ref 0.61–1.24)
Calcium: 9.3 mg/dL (ref 8.9–10.3)
Chloride: 99 mmol/L — ABNORMAL LOW (ref 101–111)
Creatinine, Ser: 2.56 mg/dL — ABNORMAL HIGH (ref 0.61–1.24)
GFR calc non Af Amer: 23 mL/min — ABNORMAL LOW (ref >60)
GFR, EST AFRICAN AMERICAN: 31 mL/min — AB (ref >60)
GFR, EST AFRICAN AMERICAN: 27 mL/min — AB (ref >60)
GFR, EST NON AFRICAN AMERICAN: 27 mL/min — AB (ref >60)
Glucose, Bld: 118 mg/dL — ABNORMAL HIGH (ref 65–99)
Glucose, Bld: 123 mg/dL — ABNORMAL HIGH (ref 65–99)
POTASSIUM: 6.3 mmol/L — AB (ref 3.5–5.1)
Potassium: 6 mmol/L — ABNORMAL HIGH (ref 3.5–5.1)
SODIUM: 134 mmol/L — AB (ref 135–145)
SODIUM: 134 mmol/L — AB (ref 135–145)

## 2018-01-09 LAB — GLUCOSE, CAPILLARY
Glucose-Capillary: 112 mg/dL — ABNORMAL HIGH (ref 65–99)
Glucose-Capillary: 107 mg/dL — ABNORMAL HIGH (ref 65–99)
Glucose-Capillary: 121 mg/dL — ABNORMAL HIGH (ref 65–99)
Glucose-Capillary: 100 mg/dL — ABNORMAL HIGH (ref 65–99)

## 2018-01-09 LAB — MAGNESIUM: Magnesium: 2.1 mg/dL (ref 1.7–2.4)

## 2018-01-09 LAB — ECHOCARDIOGRAM LIMITED
HEIGHTINCHES: 66 in
WEIGHTICAEL: 3044.11 [oz_av]

## 2018-01-09 LAB — EXPECTORATED SPUTUM ASSESSMENT W REFEX TO RESP CULTURE

## 2018-01-09 LAB — EXPECTORATED SPUTUM ASSESSMENT W GRAM STAIN, RFLX TO RESP C

## 2018-01-09 LAB — PHOSPHORUS: Phosphorus: 5.5 mg/dL — ABNORMAL HIGH (ref 2.5–4.6)

## 2018-01-09 LAB — PROCALCITONIN: PROCALCITONIN: 0.42 ng/mL

## 2018-01-09 MED ORDER — NOREPINEPHRINE BITARTRATE 1 MG/ML IV SOLN
0.0000 ug/min | INTRAVENOUS | Status: DC
  Filled 2018-01-09: qty 16

## 2018-01-09 MED ORDER — DEXTROSE 50 % IV SOLN
50.0000 mL | Freq: Once | INTRAVENOUS | Status: AC
  Administered 2018-01-09: 50 mL via INTRAVENOUS
  Filled 2018-01-09: qty 50

## 2018-01-09 MED ORDER — SODIUM CHLORIDE 0.9 % IV SOLN
1.0000 g | Freq: Once | INTRAVENOUS | Status: AC
  Administered 2018-01-09: 1 g via INTRAVENOUS
  Filled 2018-01-09: qty 10

## 2018-01-09 MED ORDER — SODIUM POLYSTYRENE SULFONATE 15 GM/60ML PO SUSP: 60.0000 g | Freq: Once | ORAL | Status: DC

## 2018-01-09 MED ORDER — IPRATROPIUM-ALBUTEROL 0.5-2.5 (3) MG/3ML IN SOLN
3.0000 mL | Freq: Four times a day (QID) | RESPIRATORY_TRACT | Status: DC
  Administered 2018-01-09: 3 mL via RESPIRATORY_TRACT
  Filled 2018-01-09: qty 3

## 2018-01-09 MED ORDER — INSULIN REGULAR HUMAN 100 UNIT/ML IJ SOLN
10.0000 [IU] | Freq: Once | INTRAMUSCULAR | Status: AC
  Administered 2018-01-09: 10 [IU] via INTRAVENOUS
  Filled 2018-01-09: qty 0.1

## 2018-01-10 ENCOUNTER — Ambulatory Visit: Payer: Medicare Other | Admitting: Family

## 2018-01-11 LAB — CULTURE, RESPIRATORY W GRAM STAIN

## 2018-01-11 LAB — CULTURE, RESPIRATORY: CULTURE: NO GROWTH

## 2018-01-11 NOTE — Progress Notes (Addendum)
Patient post x-ray repositioning, showed asystole on the monitor with a palpable pulse, rhythm returned on the screen and pt began to agonal breathe, MD Simonds contacted, wife brought back into the room, pt returned to asystole on the monitor and expired at 1500 and pronounced by MD Simonds.

## 2018-01-11 NOTE — Progress Notes (Signed)
MD Simonds made aware of lack of pedal pulses and NNO at this time MD will speak with family when they arrive. Wife called nurse at 8am to say she would be arriving after 10am.

## 2018-01-11 NOTE — Progress Notes (Signed)
Pt expired and per Dr. Sung AmabileSimonds order, he was extubated.

## 2018-01-11 NOTE — Consult Note (Signed)
Austin [name]   Austin [name], M.D.  Reason for Consult: Hemetemesis   Attending Requesting Consult: Corrin Parker, M.D.  Outpatient Primary Physician: [place]  History of Present Illness: Austin [name] is a 77 y.o. male admitted yesterday with respiratory distress and hemoptysis. Head he was placed in the intensive care unit and treated empirically for pneumonia with ZosynIV. Patient was on BiPAP for respiratory supportand GI service was called for reported "hematemesis" into the BiPAP mask. According to the RN, the patient was stable overnight hemodynamically and has had no further episodes of hematemesis.patient continues to be under ventilatory support. In the interim the patient had family consultation in the DO NOT RESUSCITATE order was made. The hemoglobin has remained stable around 15. IV Protonix was initiated he continues a constant drip.  Past Medical History:  Past Medical History:  Diagnosis Date  . Acute lower UTI 07/16/2017  . Asthma   . Atrial fibrillation ([place])   . CHF (congestive heart failure) ([place])   . Chronic atrial fibrillation ([place])   . Chronic right-sided CHF (congestive heart failure) ([place]) 07/21/2017  . COPD (chronic obstructive pulmonary disease) ([place])   . Diabetes mellitus without complication ([place])   . DVT (deep venous thrombosis) ([place])   . Gastric ulcer   . GERD (gastroesophageal reflux disease)   . Hypercholesteremia   . Hypertension   . Moderate to severe pulmonary hypertension (HCC)--per 2-D echo 07/14/2017 07/15/2017  . PE (pulmonary embolism)   . Sepsis due to undetermined organism with acute respiratory failure ([place]) 07/13/2017    Problem List: Patient Active Problem List   Diagnosis Date Noted  . HCAP (healthcare-associated pneumonia) 12/18/2017  . Acute on chronic respiratory failure ([place]) 01/03/2018  . Pressure injury of skin 12/28/2017  . Atrial fibrillation with RVR ([place]) 12/30/2017   . Ileus ([place])   . Scrotal hernia   . Obstructive uropathy 12/26/2017  . Hydronephrosis   . Atrial fibrillation ([place]) 11/08/2017  . Atrial fibrillation and flutter ([place]) 11/06/2017  . Chronic right-sided CHF (congestive heart failure) ([place]) 07/21/2017  . Acute on chronic diastolic CHF (congestive heart failure) ([place])   . Chronic atrial fibrillation ([place])   . Acute lower UTI 07/16/2017  . Sepsis ([place])   . Acute respiratory failure with hypoxia ([place]) 07/15/2017  . Moderate to severe pulmonary hypertension (HCC)--per 2-D echo 07/14/2017 07/15/2017  . Mass of lung 07/15/2017  . Ascending aortic aneurysm ([place]) 07/14/2017  . Elevated bilirubin 07/14/2017  . Kidney stone 07/14/2017  . Lactic acidosis 07/14/2017  . Sepsis due to undetermined organism with acute respiratory failure ([place]) 07/13/2017  . COPD exacerbation ([place]) 12/26/2016  . Hypoxia 11/03/2015  . COPD (chronic obstructive pulmonary disease) ([place]) 11/03/2015  . DVT of popliteal vein ([place]) 11/03/2015  . Chronic systolic CHF (congestive heart failure) ([place]) 11/03/2015  . Essential hypertension 11/03/2015  . Diabetes mellitus ([place]) 11/03/2015  . Continuous tobacco abuse 11/03/2015  . Chronic pulmonary embolism ([place]) 11/03/2015  . 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 Hemoptysis 10/31/2015  . Elevated troponin 09/16/2015  . SOB (shortness of breath) 08/24/2015  . Pulmonary embolism ([place]) 08/24/2015  . Heart failure with preserved left ventricular function (HFpEF) ([place]) 08/16/2015  . Type 2 diabetes mellitus ([place]) 08/16/2015  . HLD (hyperlipidemia) 08/16/2015  . GERD (gastroesophageal reflux disease) 08/16/2015  . Chest pain 08/16/2015  . Constipation 08/16/2015  . HTN (hypertension) 07/21/2015  . COPD (chronic obstructive pulmonary disease) with chronic  bronchitis ([place]) 07/21/2015  . CHF (NYHA class IV, ACC/AHA stage D) ([place]) 07/04/2015  . CHF (congestive heart failure) ([place]) 07/04/2015    Past  Surgical History: Past Surgical History:  Procedure Laterality Date  . HERNIA REPAIR    . IR NEPHROSTOMY PLACEMENT LEFT  12/26/2017  . PERIPHERAL VASCULAR CATHETERIZATION N/A 11/03/2015   Procedure: IVC Filter Insertion;  Surgeon: Algernon Huxley, MD;  Location: [facility];  Service: Cardiovascular;  Laterality: N/A;  . PERIPHERAL VASCULAR CATHETERIZATION N/A 07/24/2016   Procedure: IVC Filter Removal;  Surgeon: Algernon Huxley, MD;  Location: [facility];  Service: Cardiovascular;  Laterality: N/A;  . PROSTATE SURGERY      Allergies: Allergies  Allergen Reactions  . Contrast Media [Iodinated Diagnostic Agents] Nausea And Vomiting  . Other Other (See Comments)    Cannot have "high doses of anesthesia because of his lungs"    Home Medications: Medications Prior to Admission  Medication Sig Dispense Refill Last Dose  . albuterol (PROAIR HFA) 108 (90 Base) MCG/ACT inhaler Inhale 2 puffs into the lungs every 6 (six) hours as needed for wheezing or shortness of breath.    01/03/2018 at Unknown time  . aspirin EC 81 MG tablet Take 81 mg by mouth daily.   12/25/2017 at Unknown time  . budesonide-formoterol (SYMBICORT) 160-4.5 MCG/ACT inhaler Inhale 1 puff into the lungs 2 (two) times daily. 1 Inhaler 12 12/20/2017 at Unknown time  . digoxin (LANOXIN) 0.125 MG tablet Take 1 tablet (0.125 mg total) by mouth daily. 30 tablet 0 12/26/2017 at Unknown time  . diltiazem (CARDIZEM CD) 180 MG 24 hr capsule Take 1 capsule (180 mg total) by mouth daily. 30 capsule 0 12/14/2017 at Unknown time  . furosemide (LASIX) 40 MG tablet Take 1 tablet (40 mg total) by mouth 2 (two) times daily. 30 tablet 1 12/26/2017 at Unknown time  . insulin glargine (LANTUS) 100 UNIT/ML injection Inject 0.1 mLs (10 Units total) into the skin daily. 10 mL 11 01/07/2018 at Unknown time  . insulin lispro (HUMALOG) 100 UNIT/ML injection Inject 0-15 Units into the skin 3 (three) times daily before meals.   12/16/2017 at Unknown time   . ipratropium-albuterol (DUONEB) 0.5-2.5 (3) MG/3ML SOLN Take 3 mLs by nebulization every 6 (six) hours. 360 mL  12/28/2017 at Unknown time  . lovastatin (MEVACOR) 20 MG tablet Take 20 mg by mouth at bedtime. Pt should take with food.    01/07/2018 at Unknown time  . mupirocin cream (BACTROBAN) 2 % Apply topically 2 (two) times daily. 15 g 0 12/29/2017 at Unknown time  . omeprazole (PRILOSEC) 40 MG capsule Take 40 mg by mouth daily.   01/10/2018 at Unknown time  . polyethylene glycol (MIRALAX) packet Take 17 g by mouth daily as needed for moderate constipation. 30 each 0 12/29/2017 at Unknown time  . vitamin C (VITAMIN C) 250 MG tablet Take 1 tablet (250 mg total) by mouth 2 (two) times daily. 30 tablet 1 01/06/2018 at Unknown time  . feeding supplement, ENSURE ENLIVE, (ENSURE ENLIVE) LIQD Take 237 mLs by mouth 2 (two) times daily between meals. 237 mL 12   . guaiFENesin-dextromethorphan (ROBITUSSIN DM) 100-10 MG/5ML syrup Take 10 mLs by mouth every 6 (six) hours as needed for cough. 118 mL 0 PRN at PRN  . insulin aspart (NOVOLOG) 100 UNIT/ML injection Inject 0-5 Units into the skin at bedtime. CBG < 70: implement hypoglycemia protocol CBG 70 - 120: 0 units CBG 121 - 150:  0 units CBG 151 - 200: 0 units CBG 201 - 250: 2 units CBG 251 - 300: 3 units CBG 301 - 350: 4 units CBG 351 - 400: 5 units CBG > 400: call MD and obtain STAT lab verification (Patient not taking: Reported on 12/14/2017) 10 mL 11 Not Taking at Unknown time  . insulin aspart (NOVOLOG) 100 UNIT/ML injection Inject 0-15 Units into the skin 3 (three) times daily with meals. CBG < 70: implement hypoglycemia protocol CBG 70 - 120: 0 units CBG 121 - 150: 2 units CBG 151 - 200: 3 units CBG 201 - 250: 5 units CBG 251 - 300: 8 units CBG 301 - 350: 11 units CBG 351 - 400: 15 units CBG > 400: call MD and obtain STAT lab verification (Patient not taking: Reported on 12/23/2017) 10 mL 11 Not Taking at Unknown time  . nitroGLYCERIN (NITROSTAT)  0.4 MG SL tablet Place 0.4 mg under the tongue every 5 (five) minutes x 3 doses as needed for chest pain. If no relief call 911 or go to emergency room.   PRN at PRN  . oxyCODONE (OXY IR/ROXICODONE) 5 MG immediate release tablet Take 1 tablet (5 mg total) by mouth every 4 (four) hours as needed for moderate pain. 30 tablet 0 PRN at PRN   Home medication reconciliation was completed with the patient.   Scheduled Inpatient Medications:   . budesonide (PULMICORT) nebulizer solution  0.5 mg Nebulization BID  . chlorhexidine gluconate (MEDLINE KIT)  15 mL Mouth Rinse BID  . furosemide  20 mg Intravenous Q12H  . insulin aspart  0-15 Units Subcutaneous Q4H  . insulin glargine  10 Units Subcutaneous Daily  . ipratropium-albuterol  3 mL Nebulization Q4H  . mouth rinse  15 mL Mouth Rinse QID  . mupirocin cream  1 application Topical BID  . [START ON 01/12/2018] pantoprazole  40 mg Intravenous Q12H  . thrombin recombinant  5,000 Units Topical Once    Continuous Inpatient Infusions:   . fentaNYL infusion INTRAVENOUS 100 mcg/hr (January 20, 2018 0600)  . norepinephrine (LEVOPHED) Adult infusion Stopped (01/07/2018 2147)  . pantoprozole (PROTONIX) infusion 8 mg/hr (Jan 20, 2018 0828)  . piperacillin-tazobactam (ZOSYN)  IV 3.375 g (01/20/2018 0535)    PRN Inpatient Medications:  acetaminophen, docusate sodium, fentaNYL (SUBLIMAZE) injection, fentaNYL (SUBLIMAZE) injection, guaiFENesin-dextromethorphan, midazolam, oxyCODONE, polyethylene glycol  Family History: family history includes CAD in his brother; Congestive Heart Failure in his brother.   GI Family History: negative.  Social History:   reports that he has been smoking cigarettes.  He has a 45.00 pack-year smoking history. he has never used smokeless tobacco. He reports that he does not drink alcohol or use drugs. The patient denies ETOH, tobacco, or drug use.    Review of Systems: Review of Systems - unobtainable   Physical Examination: BP 92/61    Pulse 91   Temp (!) 97.5 F (36.4 C) (Axillary)   Resp 20   Ht 5' 6"  (1.676 m)   Wt 86.3 kg (190 lb 4.1 oz)   SpO2 99%   BMI 30.71 kg/m  Physical Exam  Data: Lab Results  Component Value Date   WBC 11.1 (H) 01/20/2018   HGB 15.1 01/20/18   HCT 48.6 2018-01-20   MCV 94.6 01/20/2018   PLT 173 01/20/2018   Recent Labs  Lab 12/27/2017 1115 12/31/2017 2133 01/20/18 0449  HGB 16.5 15.2 15.1   Lab Results  Component Value Date   NA 134 (L) 20-Jan-2018   K 6.3 (HH)  02/04/18   CL 99 (L) 02/04/2018   CO2 26 2018/02/04   BUN 59 (H) 02-04-18   CREATININE 2.23 (H) 02/04/18   Lab Results  Component Value Date   ALT 22 01/04/2018   AST 27 12/31/2017   ALKPHOS 107 12/15/2017   BILITOT 1.9 (H) 01/02/2018   Recent Labs  Lab 12/17/2017 2133  INR 1.37   CBC Latest Ref Rng & Units 02-04-2018 12/12/2017 01/01/2018  WBC 3.8 - 10.6 K/uL 11.1(H) 10.5 11.1(H)  Hemoglobin 13.0 - 18.0 g/dL 15.1 15.2 16.5  Hematocrit 40.0 - 52.0 % 48.6 48.3 51.9  Platelets 150 - 440 K/uL 173 173 214    STUDIES: Dg Chest Port 1 View  Result Date: Feb 04, 2018 CLINICAL DATA:  77 year old male with shortness of breath, respiratory failure. EXAM: PORTABLE CHEST 1 VIEW COMPARISON:  01/07/2018 and earlier. FINDINGS: Portable AP upright view at 0442 hrs. Stable endotracheal tube tip, between the level the clavicles and carina. Stable right IJ central line. Stable cardiomegaly and mediastinal contours. Continued veiling opacity at both lung bases. Superimposed dense retrocardiac opacity. Overall ventilation has not significantly changed since 12/30/2017. No pneumothorax. Stable pulmonary vascularity without overt edema. IMPRESSION: 1.  Stable lines and tubes. 2. Stable ventilation since 12/30/2017. Bilateral pleural effusion effusions and dense lower lobe collapse or consolidation. Electronically Signed   By: Genevie Ann M.D.   On: Feb 04, 2018 06:41   Dg Chest Port 1 View  Result Date: 01/03/2018 CLINICAL DATA:  77  y/o  M; central line placement. EXAM: PORTABLE CHEST 1 VIEW COMPARISON:  12/18/2017 chest radiograph FINDINGS: Stable cardiomegaly given projection and technique. Endotracheal tube is 6 cm from carina. Right central venous catheter tip projects over mid SVC. Stable hazy opacities of the lungs. Partially visualized surgical drain projects over the left upper quadrant and surgical clips over the right upper quadrant. Probable small effusions. IMPRESSION: 1. Endotracheal tube 6 cm from carina and right central venous catheter tip projects over mid SVC. 2. Stable cardiomegaly and hazy pulmonary opacities. Probable small effusions. Electronically Signed   By: Kristine Garbe M.D.   On: 01/01/2018 22:27   Dg Chest Port 1 View  Result Date: 12/19/2017 CLINICAL DATA:  Dyspnea, hypoxia, asthma-COPD, CHF.  Current smoker. EXAM: PORTABLE CHEST 1 VIEW COMPARISON:  Chest x-ray of December 31, 2016 FINDINGS: The lungs remain hyperinflated. The interstitial markings remain increased with subtle areas of airspace opacity. There is pleural fluid blunting the costophrenic angles. The cardiac silhouette remains enlarged and the pulmonary vascularity engorged. There calcification in the wall of the aortic arch. IMPRESSION: COPD. CHF with pulmonary interstitial and early alveolar edema. One cannot exclude superimposed pneumonia in the appropriate clinical setting. There are small bilateral pleural effusions. Thoracic aortic atherosclerosis. Electronically Signed   By: David  Martinique M.D.   On: 12/29/2017 11:44   @IMAGES @  Assessment: 1. Presumed hematemesis-this point is unknown with the patient swallowed blood in the vomitus A blood coming from the respiratory tract or if he actually did have small volume hematemesis. In any event, it appears to be hemodynamically insignificant blood loss. 2. Aspiration pneumonia. 3. DO NOT RESUSCITATE status. 4.Presumed sepsis secondary to #2 above. 5. Respiratory failure secondary  to #2 above.  Recommendations: 1. Continue IV Protonix.continue serial hemoglobin checks. 2. Chest service will not proceed with any intervention unless profound unresolved gastrointestinal bleeding resumes. 3. We'll follow along. PEG for the consultation.  Thank you for the consult. Please call with questions or concerns.  Olean Ree, MD  02-04-18  9:54 AM

## 2018-01-11 NOTE — Progress Notes (Signed)
Visited the family and offered prayer, and emotional and spiritual support. Offered to return as needed.

## 2018-01-11 NOTE — Care Management (Signed)
Patient with several readmissions from skilled facility for respiratory issues due to CHF and severe COPD.  Palliative consult pending.  Service has spoken with patient/family during previous admissions and patient has remained full code.  DNR placed late last pm after patient intubated.

## 2018-01-11 NOTE — Progress Notes (Signed)
[facility] [place] [facility]   PATIENT NAME: Austin [name]    MR#:  [id number]  DATE OF BIRTH:  11-18-1941  SUBJECTIVE:  CHIEF COMPLAINT:   Chief Complaint  Patient presents with  . Respiratory Distress  . Hematemesis   Patient seen earlier in the day  Intubated on pressor support Sedated  REVIEW OF SYSTEMS:    Review of Systems  Unable to perform ROS: Intubated    DRUG ALLERGIES:   Allergies  Allergen Reactions  . Contrast Media [Iodinated Diagnostic Agents] Nausea And Vomiting  . Other Other (See Comments)    Cannot have "high doses of anesthesia because of his lungs"    VITALS:  Blood pressure (!) 60/38, pulse 84, temperature (!) 97.5 F (36.4 C), temperature source Axillary, resp. rate 14, height 5\' 6"  (1.676 m), weight 86.3 kg (190 lb 4.1 oz), SpO2 93 %.  PHYSICAL EXAMINATION:   Physical Exam  GENERAL:  77 y.o.-year-old patient lying in the bed. Looks critically ill EYES: Pupils equal, round, reactive to light and accommodation. ETT HEENT: Head atraumatic, normocephalic.  NECK:  Supple, no jugular venous distention. LUNGS: Normal breath sounds bilaterally, no wheezing, rales, rhonchi. No use of accessory muscles of respiration.  CARDIOVASCULAR: S1, S2 normal. No murmurs, rubs, or gallops.  ABDOMEN: Soft, nontender, nondistended.  EXTREMITIES: No cyanosis, clubbing PSYCHIATRIC: The patient is sedated  LABORATORY PANEL:   CBC Recent Labs  Lab 02-08-18 0449  WBC 11.1*  HGB 15.1  HCT 48.6  PLT 173   ------------------------------------------------------------------------------------------------------------------ Chemistries  Recent Labs  Lab 12/21/2017 1252  02/08/2018 0449 02/08/18 1204  NA 134*   < > 134* 134*  K 4.9   < > 6.3* 6.0*  CL 98*   < > 99* 99*  CO2 28   < > 26 26  GLUCOSE 98   < > 118* 123*  BUN 50*   < > 59* 62*  CREATININE 1.71*   < > 2.23* 2.56*  CALCIUM 9.6   < > 9.3 9.8  MG  --    < > 2.1  --   AST 27   --   --   --   ALT 22  --   --   --   ALKPHOS 107  --   --   --   BILITOT 1.9*  --   --   --    < > = values in this interval not displayed.   ------------------------------------------------------------------------------------------------------------------  Cardiac Enzymes Recent Labs  Lab 12/24/2017 2133  TROPONINI 0.27*   ------------------------------------------------------------------------------------------------------------------  RADIOLOGY:  Dg Abd 1 View  Result Date: February 08, 2018 CLINICAL DATA:  OG tube placement EXAM: ABDOMEN - 1 VIEW COMPARISON:  01/05/2018 FINDINGS: There is an enteric tube with tip in the body of the stomach with side port below GE junction. Left-sided percutaneous nephrostomy tube is identified. Left ureteral calculus is identified measuring 1.3 cm. Small bilateral pleural effusions are identified. Left lower lobe airspace consolidation noted. IMPRESSION: 1. The enteric tube tip is in the body of the stomach Electronically Signed   By: Signa Kell M.D.   On: 02-08-18 15:04   Dg Chest Port 1 View  Result Date: February 08, 2018 CLINICAL DATA:  77 year old male with shortness of breath, respiratory failure. EXAM: PORTABLE CHEST 1 VIEW COMPARISON:  12/28/2017 and earlier. FINDINGS: Portable AP upright view at 0442 hrs. Stable endotracheal tube tip, between the level the clavicles and carina. Stable right IJ central line. Stable cardiomegaly and mediastinal contours. Continued  veiling opacity at both lung bases. Superimposed dense retrocardiac opacity. Overall ventilation has not significantly changed since 12/30/2017. No pneumothorax. Stable pulmonary vascularity without overt edema. IMPRESSION: 1.  Stable lines and tubes. 2. Stable ventilation since 12/30/2017. Bilateral pleural effusion effusions and dense lower lobe collapse or consolidation. Electronically Signed   By: Odessa FlemingH  Hall M.D.   On: 2018/08/16 06:41   Dg Chest Port 1 View  Result Date: 01/10/2018 CLINICAL  DATA:  77 y/o  M; central line placement. EXAM: PORTABLE CHEST 1 VIEW COMPARISON:  12/21/2017 chest radiograph FINDINGS: Stable cardiomegaly given projection and technique. Endotracheal tube is 6 cm from carina. Right central venous catheter tip projects over mid SVC. Stable hazy opacities of the lungs. Partially visualized surgical drain projects over the left upper quadrant and surgical clips over the right upper quadrant. Probable small effusions. IMPRESSION: 1. Endotracheal tube 6 cm from carina and right central venous catheter tip projects over mid SVC. 2. Stable cardiomegaly and hazy pulmonary opacities. Probable small effusions. Electronically Signed   By: Mitzi HansenLance  Furusawa-Stratton M.D.   On: 01/07/2018 22:27   Dg Chest Port 1 View  Result Date: 12/28/2017 CLINICAL DATA:  Dyspnea, hypoxia, asthma-COPD, CHF.  Current smoker. EXAM: PORTABLE CHEST 1 VIEW COMPARISON:  Chest x-ray of December 31, 2016 FINDINGS: The lungs remain hyperinflated. The interstitial markings remain increased with subtle areas of airspace opacity. There is pleural fluid blunting the costophrenic angles. The cardiac silhouette remains enlarged and the pulmonary vascularity engorged. There calcification in the wall of the aortic arch. IMPRESSION: COPD. CHF with pulmonary interstitial and early alveolar edema. One cannot exclude superimposed pneumonia in the appropriate clinical setting. There are small bilateral pleural effusions. Thoracic aortic atherosclerosis. Electronically Signed   By: David  SwazilandJordan M.D.   On: 01/04/2018 11:44     ASSESSMENT AND PLAN:   * Acute on chronic respiratory failure * Aspiration pneumonia  * severe emphysema with COPD exacerbation   IV steroid, inhaled steroid, nebulizer therapy. * CHF- acute on chronic diastolic * Upper GI bleed * A. Fib * CAD stage III * Hyperkalemia  FUll vent support Pressors GI consult Poor prognsosis Appreciate pulmonary help with patient in ICU  CODE STATUS:  DNR  DVT Prophylaxis: SCDs  TOTAL TIME TAKING CARE OF THIS PATIENT: 25 minutes.   Molinda BailiffSrikar R Tenise Stetler M.D on Apr 14, 2018 at 4:14 PM  Between 7am to 6pm - Pager - [phone]  After 6pm go to [email or web address] - password [email or web address] [facility]  [facility]  Office  [phone]  CC: Primary care physician; [facility]  Note: This dictation was prepared with Dragon dictation along with smaller phrase technology. Any transcriptional errors that result from this process are unintentional.

## 2018-01-11 NOTE — Discharge Instructions (Signed)
Heart Failure Clinic appointment on January 17 2018 at 10:40am with Austin [name], FNP. Please call [phone] to reschedule.

## 2018-01-11 NOTE — Progress Notes (Signed)
Pt was seen on morning rounds and DNR/goals of care were discussed further with pt's wife. She understood that ACLS or HD would not offer any benefit or chance of meaningful recovery from this illness. We agreed to continue support for the next couple of days and see if he could show any improvement. He was treated for hyperkalemia, vent changes were made and his care plan was reviewed in detail and modified.   This afternoon, a KUB was performed to confirm OGT placement. After this procedure, he suffered asystolic arrest. Per previous decisions, no ACLS was initiated. I have confirmed cardiac death by exam. He has no spontaneous movements, no spontaneous respiratory effort. Heart sounds and pulses are absent.   Wife was at bedside and I have offered my sympathies. Chaplain has been called  Time of death 1500  Billy Fischeravid Lezlee Gills, MD PCCM service Mobile [phone] Pager [phone] 12/27/2017 3:08 PM

## 2018-01-11 NOTE — Progress Notes (Signed)
Wife of patient stated the pt did not have any belonging at bedside to take back home.

## 2018-01-11 NOTE — Progress Notes (Deleted)
Md Simonds made aware of heart rate >160-180 and NNO at this time, if patient is comfortable. MD will contact POA niece to update.    

## 2018-01-11 NOTE — Progress Notes (Signed)
CRITICAL VALUE ALERT  Critical Value:  Potassium 6.3  Date & Time Notied:  02-04-2018 @ 0545  Provider Notified: Luci Bankukov, NP  Orders Received/Actions taken: Acknowledged. No new orders at this time.

## 2018-01-11 NOTE — Progress Notes (Signed)
[facility] contacted at 3:14pm, [facility] reference number [id number]

## 2018-01-11 DEATH — deceased

## 2018-01-13 LAB — CULTURE, BLOOD (ROUTINE X 2)
Culture: NO GROWTH
Culture: NO GROWTH
Special Requests: ADEQUATE
Special Requests: ADEQUATE

## 2018-01-15 ENCOUNTER — Ambulatory Visit: Payer: Self-pay | Admitting: Urology

## 2018-01-15 LAB — BLOOD GAS, ARTERIAL
ACID-BASE EXCESS: 2.8 mmol/L — AB (ref 0.0–2.0)
Bicarbonate: 29.5 mmol/L — ABNORMAL HIGH (ref 20.0–28.0)
DELIVERY SYSTEMS: POSITIVE
Expiratory PAP: 5
FIO2: 0.8
Inspiratory PAP: 10
PATIENT TEMPERATURE: 37
pCO2 arterial: 51 mmHg — ABNORMAL HIGH (ref 32.0–48.0)
pH, Arterial: 7.37 (ref 7.350–7.450)
pO2, Arterial: <31 mmHg — CL (ref 83.0–108.0)

## 2018-01-17 ENCOUNTER — Ambulatory Visit: Payer: Medicare Other | Admitting: Family

## 2018-01-17 ENCOUNTER — Telehealth: Payer: Self-pay

## 2018-01-17 NOTE — Telephone Encounter (Signed)
Informed [facility] death cert is ready for pick up. Nothing further needed.

## 2018-01-17 NOTE — Telephone Encounter (Signed)
[facility] [facility] Death Certificate to be signed  Placed in Nurse Box

## 2018-01-17 NOTE — Telephone Encounter (Signed)
Death cert placed in DS' folder to be filled out.

## 2018-02-08 NOTE — Discharge Summary (Signed)
DEATH SUMMARY  DATE OF ADMISSION:  November 24, 2018  DATE OF DISCHARGE/DEATH:  01/07/2018  ADMISSION DIAGNOSES:   Acute on chronic respiratory failure Healthcare associated pneumonia Severe emphysema. CHF- acute on chronic diastolic Right upper activity swelling Hemoptysis vs hematemesis Chronic  A. Fib CKD stage III L ureteral stone with hydronephrosis, S/P recent L nephrostomy tube placement   DISCHARGE DIAGNOSES:   Acute on chronic respiratory failure Healthcare associated aspiration pneumonia Severe emphysemaCOPD CHF- acute on chronic diastolic Right upper activity swelling Hemoptysis vs hematemesis Chronic  A. Fib CKD stage III L ureteral stone with hydronephrosis, S/P recent L nephrostomy tube placement Hyperkalemia   PRESENTATION:   Pt was admitted to the ICU by the Hospitalist Service with PCCM consultation with the following HPI and the above admission diagnoses:  HISTORY OF PRESENT ILLNESS: Austin [name]  is a 77 y.o. male with a known history of asthma, A. Fib, CHF, COPD, diabetes, DVT, hypertension, PE- and recurrent admissions in last few weeks. He was discharged to rehabilitation 1/28 after having a palliative care meeting also like yesterday- and was sent to rehabilitation with CPAP usage. Today morning sent back to emergency room with complaint of cough and some blood in there, was noted to be hypoxic in ER started on BiPAP on. Later on after talking to ICU physician and he tried to take him off the BiPAP but he failed and has to be kept on BiPAP so we have to admit to stepdown. His x-ray chest showed some infiltrate versus edema so there may be some healthcare associated pneumonia.   HOSPITAL COURSE:   Admitted to ICU/SDU on BIPAP and PCCM consultation performed. Underwent intubation 01/29 to protect airway in setting of what was felt to be severe hematemesis. Supported on mechanical ventilation. Briefly required vasopressors. On morning of 01/30, he was hyperkalemic,  oligo-anuric with hyperkalemia. Goals of care consultation was held with pt's wife and it was decided that we would not initiate hemodialysis or perform ACLS on the basis of his progressive chronic health problems and his recent decline in functional status. Hyperkalemia was treated medically.  Gastroenterology consultation was performed and it was not felt that he required urgent EGD. While getting KUB performed to confirm OGT placement, he suffered asystolic arrest. No resuscitative efforts were undertaken and he passed away peacefully. No autopsy was requested.    Cause of death: Cardiac arrest due to hyperkalemia due to AKI/CKD  Contributing factors: Acute respiratory failure, hematemesis, hemorrhagic shock, aspiration PNA, hydronephrosis, COPD, CHF  Autopsy: No   [name], MD PCCM service Mobile [phone] Pager [phone] 01/11/2018 3:32 PM

## 2018-10-04 IMAGING — US US INTRAOPERATIVE - NO REPORT
1 series · 1 of 1 positions shown · non-contrast
Comparison: none

CLINICAL DATA: Left nephrolithiasis and hydronephrosis with
possible sepsis or pyonephrosis

EXAM:
LEFT PERCUTANEOUS NEPHROSTOMY CATHETER PLACEMENT UNDER ULTRASOUND
AND FLUOROSCOPIC GUIDANCE
FLUOROSCOPY TIME:  seconds
TECHNIQUE: The procedure, risks (including but not limited to bleeding,
infection, organ damage ), benefits, and alternatives were explained
to the patient. Questions regarding the procedure were encouraged
and answered. The patient understands and consents to the procedure.

[Series 1: us intraoperative - no report · 0.26mm/px · 1 of 1 slices shown]
[im 1/1]
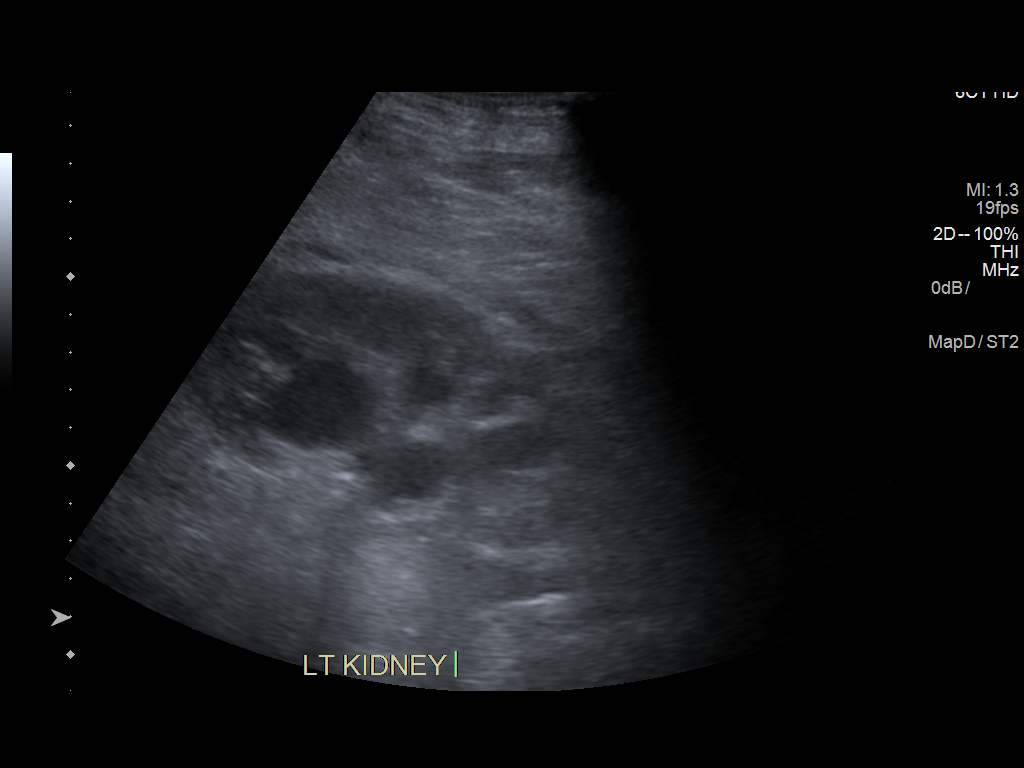

[1 of 1 positions shown; findings below may reference images not displayed]

Leftflank region prepped with chlorhexidine, draped in usual sterile
fashion, infiltrated locally with 1% lidocaine.

Patient was already receiving adequate prophylactic antibiotic
coverage.

Intravenous Fentanyl and Versed were administered as conscious
sedation during continuous monitoring of the patient's level of
consciousness and physiological / cardiorespiratory status by the
radiology RN, with a total moderate sedation time of 30 minutes.

Under real-time ultrasound guidance, a 21-gauge trocar needle was
advanced into a posterior lower pole calyx. Ultrasound image
documentation was saved. Urine spontaneously returned through the
needle. Needle was exchanged over a guidewire for transitional
dilator. Contrast injection confirmed appropriate positioning.
Multiple filling defects consistent with renal calculi are evident.
Catheter was exchanged over a guidewire for a 10 French pigtail
catheter, formed centrally within the left renal collecting system.
Cloudy urine returned. Contrast injection confirms appropriate
positioning and patency. Catheter secured externally with 0 Prolene
suture and placed to external drain bag.

COMPLICATIONS:
COMPLICATIONS
none
IMPRESSION: 1. Technically successful left percutaneous nephrostomy catheter
placement.

## 2019-03-24 IMAGING — XA IR NEPHROSTOMY PLACEMENT LEFT
1 series · 4 of 4 positions shown · non-contrast
Comparison: none

CLINICAL DATA: Left nephrolithiasis and hydronephrosis with
possible sepsis or pyonephrosis

EXAM:
LEFT PERCUTANEOUS NEPHROSTOMY CATHETER PLACEMENT UNDER ULTRASOUND
AND FLUOROSCOPIC GUIDANCE
FLUOROSCOPY TIME:  seconds
TECHNIQUE: The procedure, risks (including but not limited to bleeding,
infection, organ damage ), benefits, and alternatives were explained
to the patient. Questions regarding the procedure were encouraged
and answered. The patient understands and consents to the procedure.

[Series 1: run · 4 of 14 frames shown]
[frame 3/14]
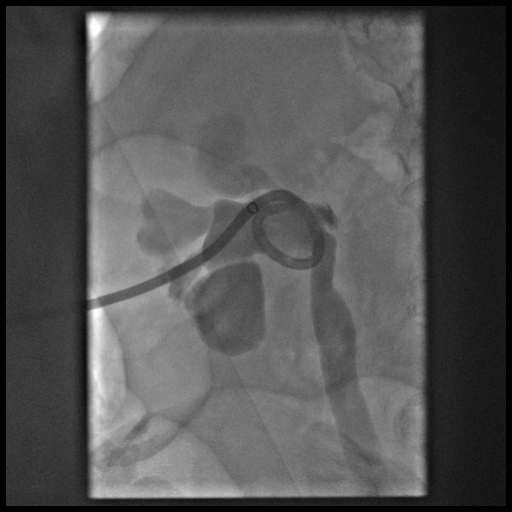
[frame 6/14]
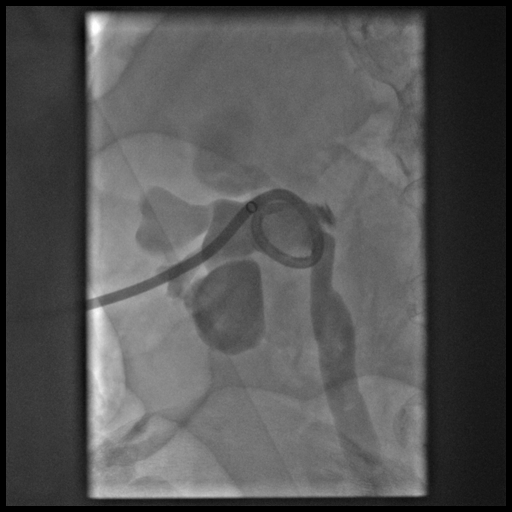
[frame 8/14]
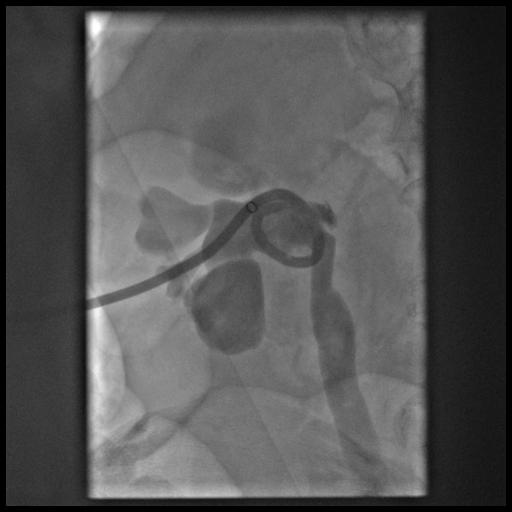
[frame 12/14]
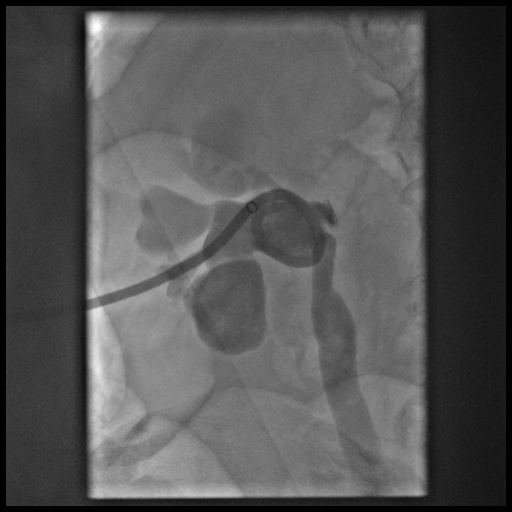

[4 of 4 positions shown; findings below may reference images not displayed]

Leftflank region prepped with chlorhexidine, draped in usual sterile
fashion, infiltrated locally with 1% lidocaine.

Patient was already receiving adequate prophylactic antibiotic
coverage.

Intravenous Fentanyl and Versed were administered as conscious
sedation during continuous monitoring of the patient's level of
consciousness and physiological / cardiorespiratory status by the
radiology RN, with a total moderate sedation time of 30 minutes.

Under real-time ultrasound guidance, a 21-gauge trocar needle was
advanced into a posterior lower pole calyx. Ultrasound image
documentation was saved. Urine spontaneously returned through the
needle. Needle was exchanged over a guidewire for transitional
dilator. Contrast injection confirmed appropriate positioning.
Multiple filling defects consistent with renal calculi are evident.
Catheter was exchanged over a guidewire for a 10 French pigtail
catheter, formed centrally within the left renal collecting system.
Cloudy urine returned. Contrast injection confirms appropriate
positioning and patency. Catheter secured externally with 0 Prolene
suture and placed to external drain bag.

COMPLICATIONS:
COMPLICATIONS
none
IMPRESSION: 1. Technically successful left percutaneous nephrostomy catheter
placement.

## 2019-03-24 IMAGING — CT CT ABD-PELV W/O CM
2 of 4 series · 14 of 46 positions shown, 16 images · non-contrast
Comparison: CT Abdomen and Pelvis without contrast 07/13/2017 and
earlier.

CLINICAL DATA: 76-year-old male with abdominal distention, unable
to have bowel movement for 4 days.

EXAM:
CT ABDOMEN AND PELVIS WITHOUT CONTRAST
TECHNIQUE: Multidetector CT imaging of the abdomen and pelvis was performed
following the standard protocol without IV contrast.

[Series 2: routine abd/pel wo · axial · 0.81mm/px · z∈[-1248,-794]mm · 11 of 106 slices shown, 13 images]
[im 10/106  soft-tissue]
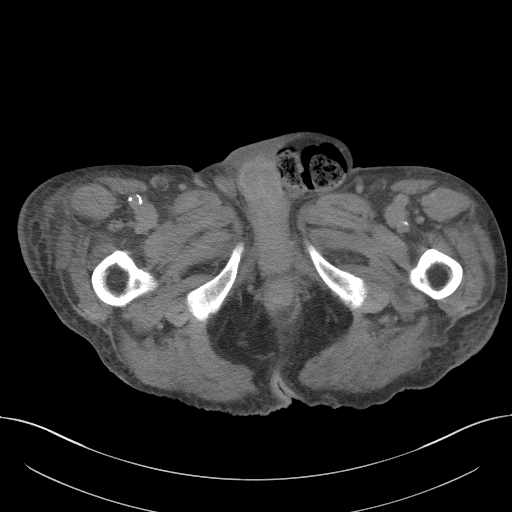
[im 10/106  bone]
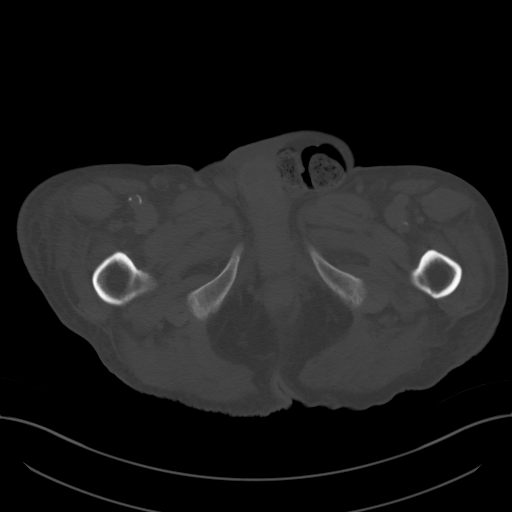
[im 19/106  soft-tissue]
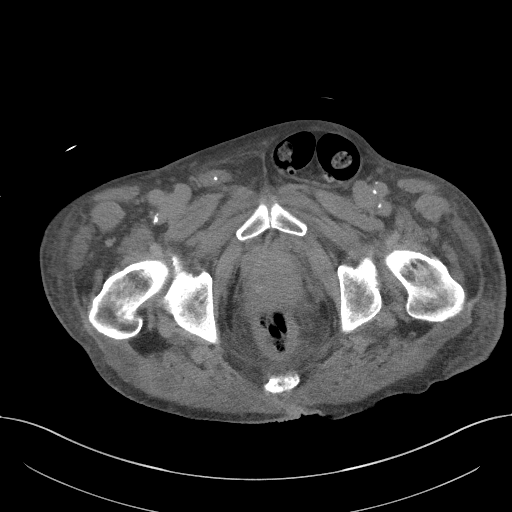
[im 28/106  soft-tissue]
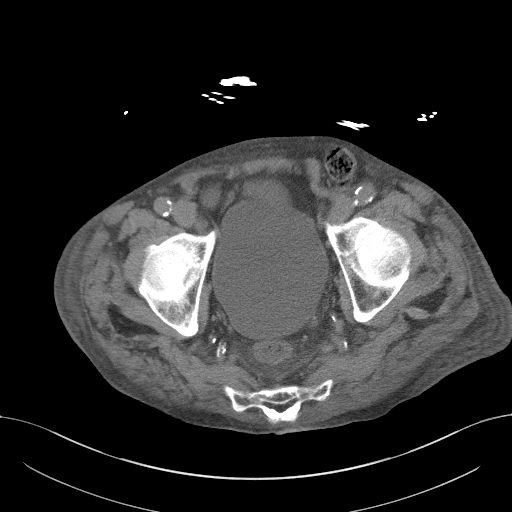
[im 37/106  soft-tissue]
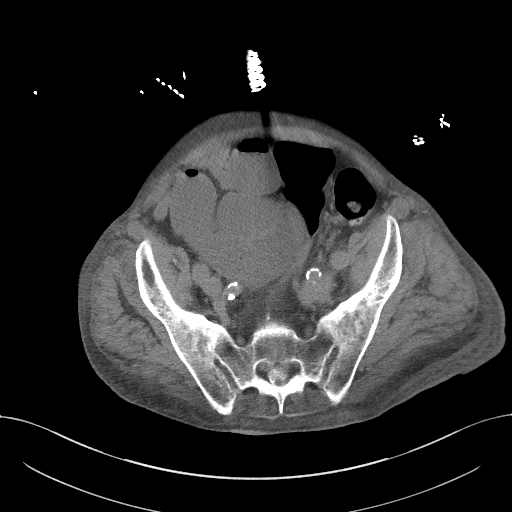
[im 46/106  soft-tissue]
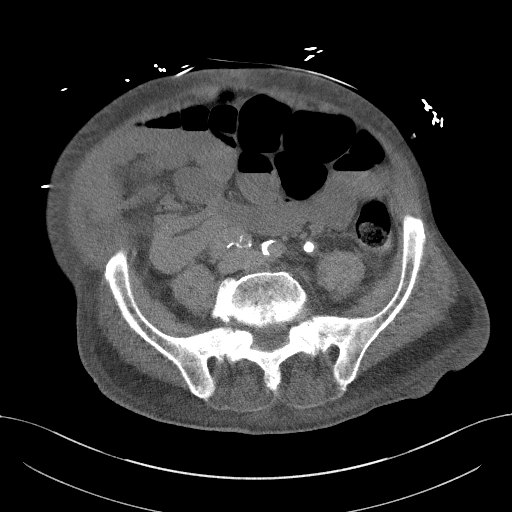
[im 55/106  soft-tissue]
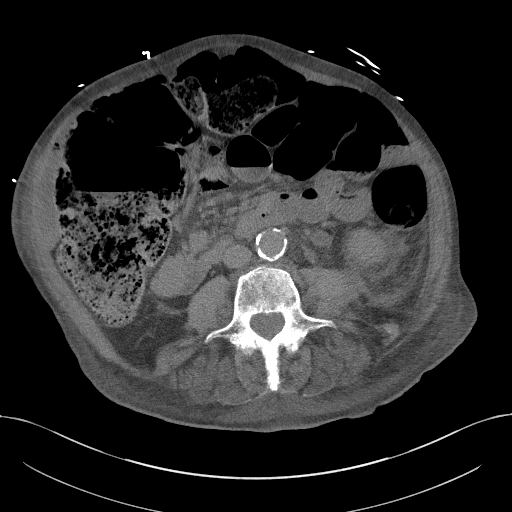
[im 64/106  soft-tissue]
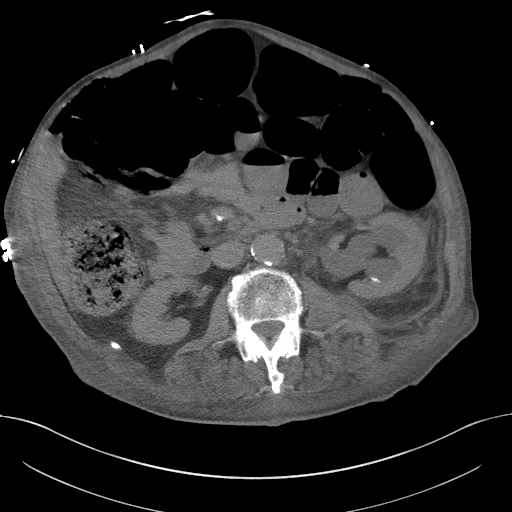
[im 74/106  soft-tissue]
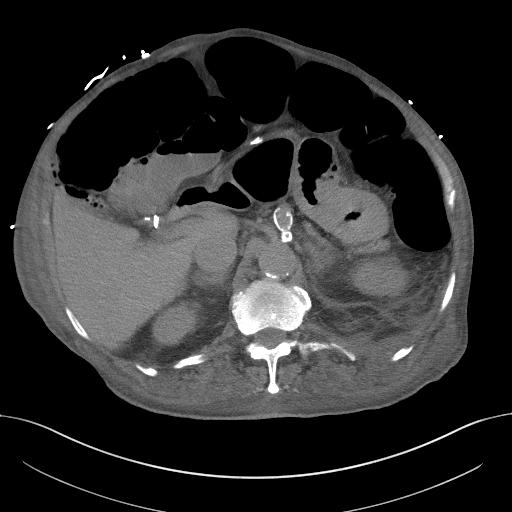
[im 83/106  soft-tissue]
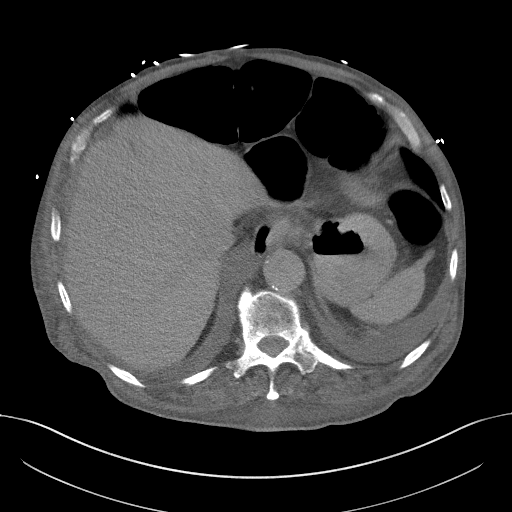
[im 83/106  bone]
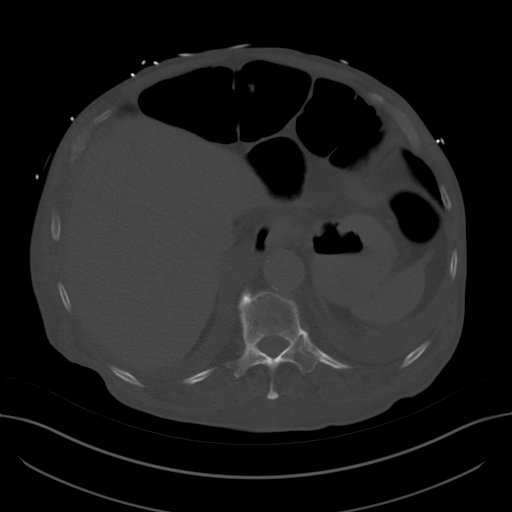
[im 92/106  soft-tissue]
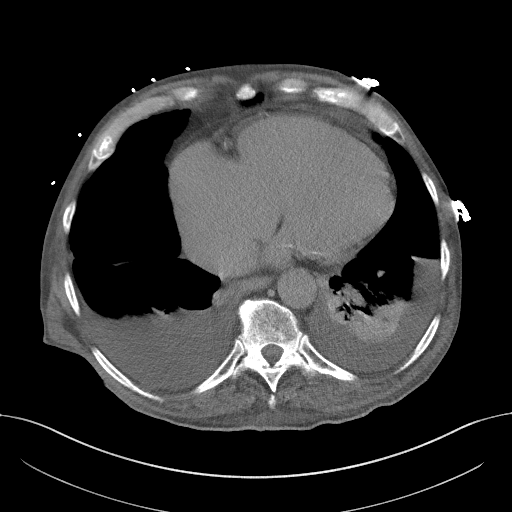
[im 101/106  soft-tissue]
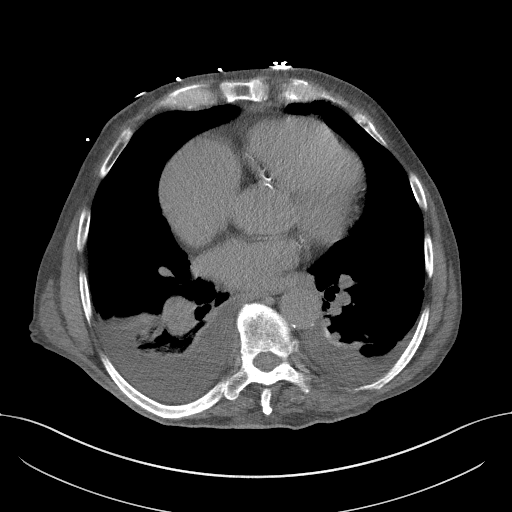

[Series 5: coronal st · coronal · 0.82mm/px · 3 of 126 slices shown]
[im 42/126  soft-tissue]
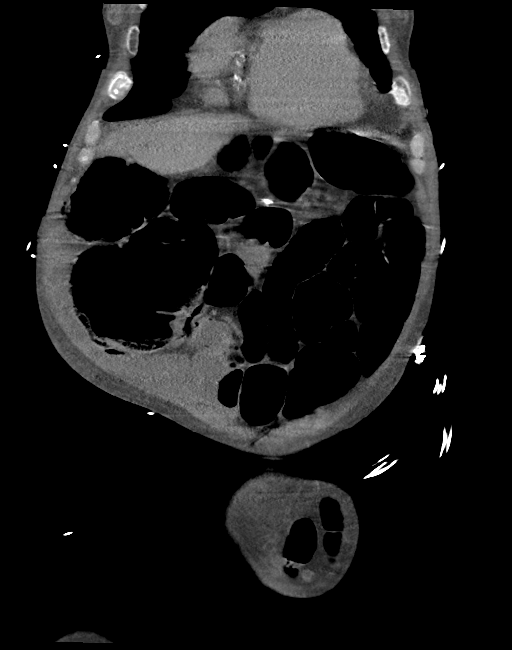
[im 56/126  soft-tissue]
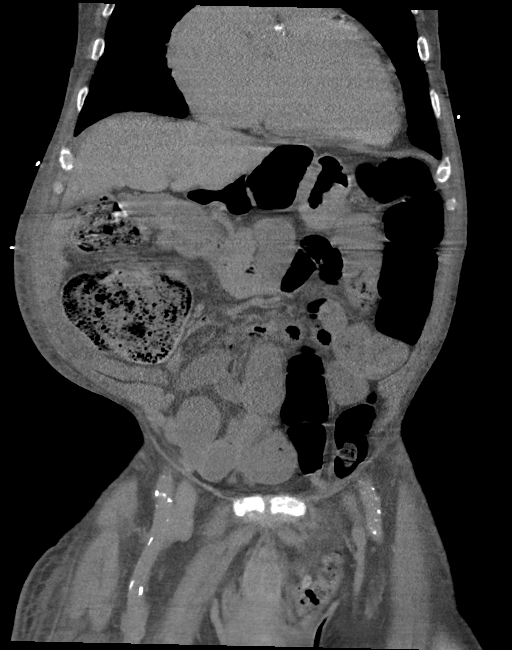
[im 70/126  soft-tissue]
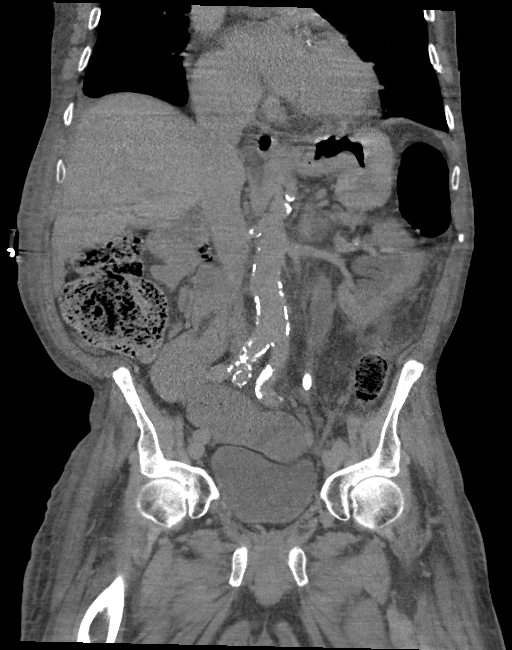

[14 of 46 positions shown; findings below may reference images not displayed]

FINDINGS: Lower chest: Moderate-sized layering pleural effusions,
substantially increased since [REDACTED] when trace pleural fluid was
present. Associated compressive lower lobe atelectasis. No air
bronchograms. Underlying emphysema suspected. Stable cardiomegaly.
Calcified coronary artery atherosclerosis. No pericardial effusion.

Hepatobiliary: Surgically absent gallbladder. Stable and negative
noncontrast CT appearance of the liver.

Pancreas: Negative.

Spleen: Diminutive, negative.

Adrenals/Urinary Tract: Normal adrenal glands. The right kidney is
normal aside from calcified renal artery atherosclerosis. The right
ureter is decompressed to the bladder.

The left kidney is hydronephrotic with moderate left pararenal space
stranding and small volume of left pararenal and layering left
retroperitoneal fluid. There are multiple bulky calcifications
within the left midpole and lower pole renal collecting system (up
to 22 millimeters individually). There is a bulky oval obstructing
left ureteral calculus measuring 11 millimeters diameter by 18
millimeters in length located about 8 centimeters from the left
ureteropelvic junction (coronal image 72). Dilated upstream ureter.
Decompressed downstream ureter to the urinary bladder.

Mildly to moderately distended but otherwise unremarkable urinary
bladder.

Stomach/Bowel: Relatively decompressed rectum with no definite
rectal abnormality. Redundant sigmoid colon. Gas distended distal
sigmoid colon. The junction of the descending and sigmoid colon is
herniated along the left inguinal canal. There is diverticulosis of
the herniated bowel which contains gas and stool. The herniated
loops do not appear incarcerated. Upstream descending colon gas
distention is similar to that of the distal sigmoid.

Moderately gas distended and redundant transverse colon with
relatively little stool. Moderately gas and stool distended right
colon and cecum. The cecum measures up to 11 centimeters diameter
and is on a lax mesentery, and the terminal ileum appears to be
located along the lateral margin of the cecum, with the ileocecal
valve suspected on or near image 40 of series 2. There are gas and
fluid-filled loops of small bowel throughout the abdomen and pelvis.
Small bowel loops measure 3.5-4 centimeters diameter. The terminal
ileum is decompressed containing a small volume of fluid.

The stomach is mildly distended with gas. The duodenum is relatively
decompressed.

No abdominal free air.  No definite abdominal free fluid.

Vascular/Lymphatic: Extensive Aortoiliac calcified atherosclerosis.
Vascular patency is not evaluated in the absence of IV contrast.
Chronic fusiform aneurysmal enlargement of the celiac artery up to
13 millimeters (series 2, image 33).

No lymphadenopathy is evident.

Reproductive: Large bowel containing left inguinal hernia. Otherwise
negative.

Other: Mild presacral stranding, not definitely increased since
[DATE].

Musculoskeletal: Stable visualized osseous structures. Chronic SI
joint ankylosis.
IMPRESSION: 1. Multiple acute abnormalities which could contribute to abdominal
pain and distension:
2. Acute Obstructive Uropathy on the left due to a bulky 18
millimeter mid left ureteral calculus. Possible left forniceal
rupture.
3. Acute Bowel Obstruction suspected, and appears multifactorial:
- Left Inguinal Hernia containing the junction of the descending and
sigmoid colon which may be partially obstructing.
- partial versus early high-grade Small Bowel Obstruction which may
be secondary to compression of the terminal ileum (which has an
unusual far lateral position) by the dilated cecum.
4. No pneumoperitoneum. A small volume of free fluid in the left
abdomen felt related to # 2.
5. Moderate-sized layering bilateral pleural effusions with lung
base atelectasis.
6. Aortic Atherosclerosis (VNQPN-IM1.1) and Emphysema ([id number].C).
